# Patient Record
Sex: Male | Born: 1965
Health system: Southern US, Community
[De-identification: ages and names within clinical notes are randomized; demographics above are authoritative.]

## PROBLEM LIST (undated history)

## (undated) DIAGNOSIS — S060X9A Concussion with loss of consciousness of unspecified duration, initial encounter: Secondary | ICD-10-CM

## (undated) DIAGNOSIS — E039 Hypothyroidism, unspecified: Secondary | ICD-10-CM

## (undated) DIAGNOSIS — I5023 Acute on chronic systolic (congestive) heart failure: Secondary | ICD-10-CM

## (undated) DIAGNOSIS — E119 Type 2 diabetes mellitus without complications: Secondary | ICD-10-CM

## (undated) DIAGNOSIS — R569 Unspecified convulsions: Secondary | ICD-10-CM

## (undated) DIAGNOSIS — N529 Male erectile dysfunction, unspecified: Secondary | ICD-10-CM

## (undated) DIAGNOSIS — T884XXA Failed or difficult intubation, initial encounter: Secondary | ICD-10-CM

## (undated) HISTORY — DX: Male erectile dysfunction, unspecified: N52.9

## (undated) HISTORY — PX: HERNIA REPAIR: SHX51

---

## 2015-04-19 DIAGNOSIS — S060X9A Concussion with loss of consciousness of unspecified duration, initial encounter: Secondary | ICD-10-CM

## 2015-04-19 DIAGNOSIS — S060XAA Concussion with loss of consciousness status unknown, initial encounter: Secondary | ICD-10-CM

## 2015-04-19 HISTORY — DX: Concussion with loss of consciousness of unspecified duration, initial encounter: S06.0X9A

## 2015-04-19 HISTORY — DX: Concussion with loss of consciousness status unknown, initial encounter: S06.0XAA

## 2015-05-11 ENCOUNTER — Other Ambulatory Visit: Payer: Self-pay | Admitting: Family Medicine

## 2015-05-11 ENCOUNTER — Ambulatory Visit
Admission: RE | Admit: 2015-05-11 | Discharge: 2015-05-11 | Disposition: A | Discharge status: Home / Self Care | Payer: BLUE CROSS/BLUE SHIELD | Source: Ambulatory Visit | Attending: Family Medicine | Admitting: Family Medicine

## 2015-05-11 ENCOUNTER — Emergency Department
Admission: EM | Admit: 2015-05-11 | Discharge: 2015-05-12 | Disposition: A | Discharge status: Home / Self Care | Payer: BLUE CROSS/BLUE SHIELD | Attending: Emergency Medicine | Admitting: Emergency Medicine

## 2015-05-11 ENCOUNTER — Emergency Department: Payer: BLUE CROSS/BLUE SHIELD

## 2015-05-11 ENCOUNTER — Encounter: Payer: Self-pay | Admitting: Emergency Medicine

## 2015-05-11 DIAGNOSIS — S060X0A Concussion without loss of consciousness, initial encounter: Secondary | ICD-10-CM

## 2015-05-11 DIAGNOSIS — H5340 Unspecified visual field defects: Secondary | ICD-10-CM | POA: Diagnosis not present

## 2015-05-11 DIAGNOSIS — R51 Headache: Secondary | ICD-10-CM | POA: Diagnosis present

## 2015-05-11 DIAGNOSIS — E119 Type 2 diabetes mellitus without complications: Secondary | ICD-10-CM | POA: Diagnosis not present

## 2015-05-11 DIAGNOSIS — Z7984 Long term (current) use of oral hypoglycemic drugs: Secondary | ICD-10-CM | POA: Insufficient documentation

## 2015-05-11 DIAGNOSIS — I10 Essential (primary) hypertension: Secondary | ICD-10-CM | POA: Diagnosis not present

## 2015-05-11 DIAGNOSIS — R519 Headache, unspecified: Secondary | ICD-10-CM

## 2015-05-11 HISTORY — DX: Type 2 diabetes mellitus without complications: E11.9

## 2015-05-11 LAB — URINALYSIS COMPLETE WITH MICROSCOPIC (ARMC ONLY)
BACTERIA UA: NONE SEEN
Bilirubin Urine: NEGATIVE
Hgb urine dipstick: NEGATIVE
LEUKOCYTES UA: NEGATIVE
NITRITE: NEGATIVE
PROTEIN: 30 mg/dL — AB
Specific Gravity, Urine: 1.024 (ref 1.005–1.030)
Squamous Epithelial / LPF: NONE SEEN
pH: 5 (ref 5.0–8.0)

## 2015-05-11 LAB — CBC WITH DIFFERENTIAL/PLATELET
BASOS ABS: 0.1 10*3/uL (ref 0–0.1)
BASOS PCT: 0 %
EOS ABS: 0 10*3/uL (ref 0–0.7)
EOS PCT: 0 %
HCT: 44.7 % (ref 40.0–52.0)
Hemoglobin: 14.7 g/dL (ref 13.0–18.0)
Lymphocytes Relative: 7 %
Lymphs Abs: 0.9 10*3/uL — ABNORMAL LOW (ref 1.0–3.6)
MCH: 28.3 pg (ref 26.0–34.0)
MCHC: 32.9 g/dL (ref 32.0–36.0)
MCV: 85.8 fL (ref 80.0–100.0)
MONO ABS: 0.3 10*3/uL (ref 0.2–1.0)
Monocytes Relative: 3 %
Neutro Abs: 11.6 10*3/uL — ABNORMAL HIGH (ref 1.4–6.5)
Neutrophils Relative %: 90 %
PLATELETS: 249 10*3/uL (ref 150–440)
RBC: 5.21 MIL/uL (ref 4.40–5.90)
RDW: 13.4 % (ref 11.5–14.5)
WBC: 13 10*3/uL — ABNORMAL HIGH (ref 3.8–10.6)

## 2015-05-11 LAB — COMPREHENSIVE METABOLIC PANEL
ALBUMIN: 4.2 g/dL (ref 3.5–5.0)
ALT: 23 U/L (ref 17–63)
AST: 17 U/L (ref 15–41)
Alkaline Phosphatase: 68 U/L (ref 38–126)
Anion gap: 8 (ref 5–15)
BUN: 16 mg/dL (ref 6–20)
CHLORIDE: 101 mmol/L (ref 101–111)
CO2: 25 mmol/L (ref 22–32)
Calcium: 9.5 mg/dL (ref 8.9–10.3)
Creatinine, Ser: 1.1 mg/dL (ref 0.61–1.24)
GFR calc Af Amer: >60 mL/min (ref >60)
GLUCOSE: 332 mg/dL — AB (ref 65–99)
POTASSIUM: 3.9 mmol/L (ref 3.5–5.1)
SODIUM: 134 mmol/L — AB (ref 135–145)
Total Bilirubin: 0.4 mg/dL (ref 0.3–1.2)
Total Protein: 8.1 g/dL (ref 6.5–8.1)

## 2015-05-11 LAB — PROTIME-INR
INR: 1.03
PROTHROMBIN TIME: 13.7 s (ref 11.4–15.0)

## 2015-05-11 LAB — URINE DRUG SCREEN, QUALITATIVE (ARMC ONLY)
Amphetamines, Ur Screen: NOT DETECTED
BARBITURATES, UR SCREEN: NOT DETECTED
Benzodiazepine, Ur Scrn: NOT DETECTED
CANNABINOID 50 NG, UR ~~LOC~~: NOT DETECTED
COCAINE METABOLITE, UR ~~LOC~~: NOT DETECTED
MDMA (ECSTASY) UR SCREEN: NOT DETECTED
METHADONE SCREEN, URINE: NOT DETECTED
Opiate, Ur Screen: NOT DETECTED
Phencyclidine (PCP) Ur S: NOT DETECTED
TRICYCLIC, UR SCREEN: NOT DETECTED

## 2015-05-11 LAB — APTT: APTT: 25 s (ref 24–36)

## 2015-05-11 LAB — ETHANOL

## 2015-05-11 MED ORDER — SODIUM CHLORIDE 0.9 % IV BOLUS (SEPSIS)
1000.0000 mL | Freq: Once | INTRAVENOUS | Status: AC
  Administered 2015-05-11: 1000 mL via INTRAVENOUS

## 2015-05-11 MED ORDER — PROCHLORPERAZINE EDISYLATE 5 MG/ML IJ SOLN
10.0000 mg | Freq: Four times a day (QID) | INTRAMUSCULAR | Status: DC | PRN
  Administered 2015-05-11: 10 mg via INTRAVENOUS
  Filled 2015-05-11: qty 2

## 2015-05-11 MED ORDER — DIPHENHYDRAMINE HCL 50 MG/ML IJ SOLN
12.5000 mg | Freq: Once | INTRAMUSCULAR | Status: AC
  Administered 2015-05-11: 12.5 mg via INTRAVENOUS
  Filled 2015-05-11: qty 1

## 2015-05-11 NOTE — ED Notes (Signed)
Pt states he hit his head 1 week ago, went to PCP and had a CT, scan cleared by PCP. Pt c/o of HA that started Wednesday and has not been relieved by otc medication. Pt went to eye doctor today and was advised to come to ER due to HA and vision changes.

## 2015-05-11 NOTE — ED Notes (Signed)
Patient sent by Opthalmology for MRI Brain and orbit without contrat to rule out subarachnoid hemorrhage due to worsening headache.  Patient c/o  Headache and seeing light flashes in left eye.  Patient had a CT scan of head this morning.  Opthalmology concerned about abnormal eye exam.

## 2015-05-11 NOTE — ED Provider Notes (Addendum)
[facility] Emergency Department Provider Note  ____________________________________________   I have reviewed the triage vital signs and the nursing notes.   HISTORY  Chief Complaint Headache    HPI Shane Frazier is a 50 y.o. male with a history of hypertension, diabetes mellitus controlled by oral medications, high cholesterol presents today complaining of headache. Patient states he hit his head "pretty hard" may be 8 days ago he thinks. A few days later he had the gradual onset of headache. The initial injury he did not pass out or have any neurologic sequela but he states it did hit him "pretty good". Patient has hit his head before. He has no history of postconcussive syndrome. Gradually a few days after this head injury, patient began to have a headache. This is not a normal headache for him. He states he may have had one like this many years ago. He has had some scotoma in the left eye, but denies any visual field deficit. He denies any numbness or tingling difficulty walking or talking or any neurologic complaint. The patient states that he has had no fever no chills and no stiff neck. He is not on any blood thinners, he takes oral anti-inflammatories for this headache and sometimes they help. He had a negative CT scan of the head via his PCP this morning. He was sent to an optometrist for his left scotoma. They documented normal pressures of 14 and 20, they documented however that he had a left homonymous superior quadrantanopsia. This was bilaterally indicated by their field analyzer exam. Patient himself states that he has not noted any visual loss and that when he wears his glasses he does not notice any problem with his vision aside from occasional left-sided flashing which he is not currently having.  Past Medical History  Diagnosis Date  . Hypertension   . Diabetes mellitus without complication ([place])   . Thyroid disease   . High cholesterol     There  are no active problems to display for this patient.   Past Surgical History  Procedure Laterality Date  . Hernia repair      Current Outpatient Rx  Name  Route  Sig  Dispense  Refill  . metFORMIN (GLUMETZA) 1000 MG (MOD) 24 hr tablet   Oral   Take 1,000 mg by mouth daily.           Allergies Review of patient's allergies indicates no known allergies.  No family history on file.  Social History Social History  Substance Use Topics  . Smoking status: Never Smoker   . Smokeless tobacco: Current User    Types: Chew  . Alcohol Use: Yes     Comment: occasionally    Review of Systems Constitutional: No fever/chills Eyes: No visual changes. ENT: No sore throat. No stiff neck no neck pain Cardiovascular: Denies chest pain. Respiratory: Denies shortness of breath. Gastrointestinal:   no vomiting.  No diarrhea.  No constipation. Genitourinary: Negative for dysuria. Musculoskeletal: Negative lower extremity swelling Skin: Negative for rash. Neurological: See history of present illness, negative for focal weakness or numbness. 10-point ROS otherwise negative.  ____________________________________________   PHYSICAL EXAM:  VITAL SIGNS: ED Triage Vitals  Enc Vitals Group     BP 05/11/15 1905 152/84 mmHg     Pulse Rate 05/11/15 1905 96     Resp 05/11/15 1905 18     Temp 05/11/15 1905 99.6 F (37.6 C)     Temp Source 05/11/15 1905 Oral  SpO2 05/11/15 1905 96 %     Weight 05/11/15 1905 233 lb (105.688 kg)     Height 05/11/15 1905 5\' 7"  (1.702 m)     Head Cir --      Peak Flow --      Pain Score 05/11/15 1906 8     Pain Loc --      Pain Edu? --      Excl. in [place]? --     Constitutional: Alert and oriented. Well appearing and in no acute distress. Joking about the IV placement Eyes: Conjunctivae are normal. PERRL. EOMI. Head: Atraumatic. Nose: No congestion/rhinnorhea. Mouth/Throat: Mucous membranes are moist.  Oropharynx non-erythematous. Neck: No stridor.    Nontender with no meningismus Cardiovascular: Normal rate, regular rhythm. Grossly normal heart sounds.  Good peripheral circulation. Respiratory: Normal respiratory effort.  No retractions. Lungs CTAB. Abdominal: Soft and nontender. No distention. No guarding no rebound Back:  There is no focal tenderness or step off there is no midline tenderness there are no lesions noted. there is no CVA tenderness Musculoskeletal: No lower extremity tenderness. No joint effusions, no DVT signs strong distal pulses no edema Neurologic:  Cranial nerves II through XII are grossly intact 5 out of 5 strength bilateral upper and lower extremity. Finger to nose within normal limits heel to shin within normal limits, speech is normal with no word finding difficulty or dysarthria, reflexes symmetric, pupils are equally round and reactive to light, there is no pronator drift, sensation is normal, vision is intact to confrontation, gait is deferred, there is no nystagmus, normal neurologic exam  Skin:  Skin is warm, dry and intact. No rash noted. Psychiatric: Mood and affect are normal. Speech and behavior are normal.  ____________________________________________   LABS (all labs ordered are listed, but only abnormal results are displayed)  Labs Reviewed  URINALYSIS COMPLETEWITH MICROSCOPIC ([place]) - Abnormal; Notable for the following:    Color, Urine YELLOW (*)    APPearance CLEAR (*)    Glucose, UA >500 (*)    Ketones, ur TRACE (*)    Protein, ur 30 (*)    All other components within normal limits  CBC WITH DIFFERENTIAL/PLATELET - Abnormal; Notable for the following:    WBC 13.0 (*)    Neutro Abs 11.6 (*)    Lymphs Abs 0.9 (*)    All other components within normal limits  COMPREHENSIVE METABOLIC PANEL - Abnormal; Notable for the following:    Sodium 134 (*)    Glucose, Bld 332 (*)    All other components within normal limits  PROTIME-INR  APTT  URINE DRUG SCREEN, QUALITATIVE ([facility] ONLY)  ETHANOL    ____________________________________________  EKG  I personally interpreted any EKGs ordered by me or triage Normal sinus rhythm at 79 bpm no acute ST elevation or acute ST depression normal axis unremarkable EKG nonspecific ST changes noted ____________________________________________  RADIOLOGY  I reviewed any imaging ordered by me or triage that were performed during my shift ____________________________________________   PROCEDURES  Procedure(s) performed: None  Critical Care performed: None  ____________________________________________   INITIAL IMPRESSION / ASSESSMENT AND PLAN / ED COURSE  Pertinent labs & imaging results that were available during my care of the patient were reviewed by me and considered in my medical decision making (see chart for details).  I do not detect any visual field loss. I did discuss with the on-call ophthalmologist about the, optometrist finding as indicated above. The ophthalmologist agrees that imaging is necessary. She does  not feel an orbital imaging is required and nor do I given that they are finding this field deficit in both eyes. There is no evidence according to optometry of glaucoma. Patient has no pain to his eyes. He has diffuse generalized headache after a head injury. This could simply be postconcussive issues but the finding of a visual cut on field analysis is of concern. I am unable to reproduce this on my bedside exam. Patient does not seem to have any visual field deficits that I can isolate. Nonetheless, I did discuss with her urologist and they indicated that the best test for this would be an MRI without contrast which we have ordered. We will give the patient migraine medication in case this is new onset migraine with associated aura like flashes, ophthalmology will see the patient first thing in the morning tomorrow and they do not recommend any further visual testing. Patient is neurologically intact and we will obtain an  MRI for further evaluation.  ----------------------------------------- 9:42 PM on 05/11/2015 -----------------------------------------  Patient states his headache is improved. he has been sleeping. When I asked him to rate the headache he says it's "about 0". He does take his head off the pillow and shake his head vigorously to see if he can bring the headache back. He states he feels pretty good. He states "it might be kind of dull". The patient has no change in his neurologic status otherwise and we are waiting MRI results from radiology. While he sits flat on his back patient has sats in the mid 90s 94-95. is likely secondary to his body habitus. Signed out to Dr. Dahlia Client at the end of my shift ____________________________________________   FINAL CLINICAL IMPRESSION(S) / ED DIAGNOSES  Final diagnoses:  None     Schuyler Amor, MD 05/11/15 2126  Schuyler Amor, MD 05/11/15 2142  Schuyler Amor, MD 05/11/15 [phone]

## 2015-05-11 NOTE — ED Notes (Signed)
Patient transported to MRI 

## 2015-05-12 MED ORDER — GADOBENATE DIMEGLUMINE 529 MG/ML IV SOLN
20.0000 mL | Freq: Once | INTRAVENOUS | Status: AC | PRN
  Administered 2015-05-12: 20 mL via INTRAVENOUS

## 2015-05-12 NOTE — Discharge Instructions (Signed)

## 2015-05-12 NOTE — ED Notes (Signed)
Pt taken to MRI  

## 2015-05-12 NOTE — ED Provider Notes (Signed)
-----------------------------------------   2:59 AM on 05/12/2015 -----------------------------------------   Blood pressure 164/94, pulse 87, temperature 99.6 F (37.6 C), temperature source Oral, resp. rate 23, height 5\' 7"  (1.702 m), weight 233 lb (105.688 kg), SpO2 100 %.  Assuming care from Dr. Burlene Arnt.  In short, Shane Frazier is a 50 y.o. male with a chief complaint of Headache .  Refer to the original H&P for additional details.  The current plan of care is to f/u the MRI.  MRI: NO evidence for acute infarct.  1. 2.3 cm T2/FLAIR signal abnormality within the posterior mesial right temporal lobe adjacent to the right lateral ventricles as above. This finding is nonspecific, and may reflect sequela of an infectious or inflammatory process. Demyelinating process could also be considered, although no findings to suggest active demyelination such as enhancement or restricted diffusion seen with this lesion. Lymphoma often occurs adjacent to the lateral ventricles, however, no restricted diffusion seen with this lesion as would be expected with this entity. A low grade glioma/tumor could conceivably be considered. Neurology consultation and correlation with clinical history and laboratory values is recommended. Additionally, a short interval follow-up study suggested to document stability/resolution. 2. Few additional patchy subcentimeter T2/FLAIR hyperintense foci within the deep white matter both cerebral hemispheres, nonspecific, but most likely related to very mild chronic small vessel ischemic disease. 3. Low lying cerebellar tonsils up to 5 mm without frank Chiari malformation.   The patient will be discharged to home. He will follow up with optho and neurology  Loney Hering, MD 05/12/15 [phone]

## 2015-05-13 DIAGNOSIS — R930 Abnormal findings on diagnostic imaging of skull and head, not elsewhere classified: Secondary | ICD-10-CM | POA: Insufficient documentation

## 2015-05-13 DIAGNOSIS — R410 Disorientation, unspecified: Secondary | ICD-10-CM | POA: Insufficient documentation

## 2015-05-13 DIAGNOSIS — G43009 Migraine without aura, not intractable, without status migrainosus: Secondary | ICD-10-CM | POA: Insufficient documentation

## 2015-05-20 DIAGNOSIS — R569 Unspecified convulsions: Secondary | ICD-10-CM

## 2015-05-20 HISTORY — DX: Unspecified convulsions: R56.9

## 2015-05-22 DIAGNOSIS — Z87898 Personal history of other specified conditions: Secondary | ICD-10-CM | POA: Insufficient documentation

## 2015-07-07 ENCOUNTER — Other Ambulatory Visit: Payer: Self-pay | Admitting: Neurology

## 2015-07-07 DIAGNOSIS — G43009 Migraine without aura, not intractable, without status migrainosus: Secondary | ICD-10-CM

## 2015-08-14 ENCOUNTER — Other Ambulatory Visit: Payer: Self-pay | Admitting: Orthopedic Surgery

## 2015-08-14 DIAGNOSIS — M67912 Unspecified disorder of synovium and tendon, left shoulder: Secondary | ICD-10-CM

## 2015-08-14 DIAGNOSIS — S46002A Unspecified injury of muscle(s) and tendon(s) of the rotator cuff of left shoulder, initial encounter: Secondary | ICD-10-CM

## 2015-08-14 DIAGNOSIS — M25512 Pain in left shoulder: Secondary | ICD-10-CM

## 2015-09-01 ENCOUNTER — Ambulatory Visit
Admission: RE | Admit: 2015-09-01 | Discharge: 2015-09-01 | Disposition: A | Discharge status: Home / Self Care | Payer: BLUE CROSS/BLUE SHIELD | Source: Ambulatory Visit | Attending: Orthopedic Surgery | Admitting: Orthopedic Surgery

## 2015-09-01 DIAGNOSIS — S46002A Unspecified injury of muscle(s) and tendon(s) of the rotator cuff of left shoulder, initial encounter: Secondary | ICD-10-CM

## 2015-09-01 DIAGNOSIS — M67912 Unspecified disorder of synovium and tendon, left shoulder: Secondary | ICD-10-CM | POA: Diagnosis not present

## 2015-09-01 DIAGNOSIS — M25512 Pain in left shoulder: Secondary | ICD-10-CM

## 2015-09-01 DIAGNOSIS — M19012 Primary osteoarthritis, left shoulder: Secondary | ICD-10-CM | POA: Insufficient documentation

## 2015-09-01 DIAGNOSIS — X58XXXA Exposure to other specified factors, initial encounter: Secondary | ICD-10-CM | POA: Diagnosis not present

## 2015-09-01 DIAGNOSIS — M75122 Complete rotator cuff tear or rupture of left shoulder, not specified as traumatic: Secondary | ICD-10-CM | POA: Diagnosis not present

## 2015-09-07 ENCOUNTER — Encounter: Payer: Self-pay | Admitting: *Deleted

## 2015-09-07 ENCOUNTER — Inpatient Hospital Stay: Admission: RE | Admit: 2015-09-07 | Payer: BLUE CROSS/BLUE SHIELD | Source: Ambulatory Visit

## 2015-09-07 NOTE — Patient Instructions (Signed)
  Your procedure is scheduled on: 09-09-15 [facility]) Report to [place] To find out your arrival time please call [phone] between 1PM - 3PM on 09-08-15 (TUESDAY)  Remember: Instructions that are not followed completely may result in serious medical risk, up to and including death, or upon the discretion of your surgeon and anesthesiologist your surgery may need to be rescheduled.    __X__ 1. Do not eat food or drink liquids after midnight. No gum chewing or hard candies.     __X__ 2. No Alcohol for 24 hours before or after surgery.   ____ 3. Bring all medications with you on the day of surgery if instructed.    __X_ 4. Notify your doctor if there is any change in your medical condition     (cold, fever, infections).     Do not wear jewelry, make-up, hairpins, clips or nail polish.  Do not wear lotions, powders, or perfumes. You may wear deodorant.  Do not shave 48 hours prior to surgery. Men may shave face and neck.  Do not bring valuables to the hospital.    [facility] is not responsible for any belongings or valuables.               Contacts, dentures or bridgework may not be worn into surgery.  Leave your suitcase in the car. After surgery it may be brought to your room.  For patients admitted to the hospital, discharge time is determined by your treatment team.   Patients discharged the day of surgery will not be allowed to drive home.   Please read over the following fact sheets that you were given:     _X___ Take these medicines the morning of surgery with A SIP OF WATER:    1. KEPPRA (LEVETIRACETAM)  2. LISINOPRI  3. DILTIAZEM (CARDIZEM)  4. ATORVASTATIN (LIPITOR)  5.LEVOTHYROXINE  6.  ____ Fleet Enema (as directed)   _X___ Use CHG Soap as directed  ____ Use inhalers on the day of surgery  _X___ Stop metformin 2 days prior to surgery-STOP NOW    ____ Take 1/2 of usual insulin dose the night before surgery and none on the  morning of surgery.   ____ Stop Coumadin/Plavix/aspirin-N/A  _X___ Stop Anti-inflammatories-STOP MELOXICAM NOW-NO NSAIDS OR ASA PRODUCTS-TYLENOL OK TAKE   ____ Stop supplements until after surgery.    ____ Bring C-Pap to the hospital.

## 2015-09-07 NOTE — Pre-Procedure Instructions (Signed)
Value Range  EKG Ventricular Rate 94 BPM   EKG Atrial Rate 94 BPM   EKG P-R Interval 170 ms  EKG QRS Duration 106 ms  EKG Q-T Interval 368 ms  EKG QTC Calculation 460 ms  EKG Calculated P Axis 30 degrees   EKG Calculated R Axis 0 degrees   EKG Calculated T Axis -17 degrees    Result Narrative  NORMAL SINUS RHYTHM NONSPECIFIC T WAVE ABNORMALITY PROLONGED QT NO PREVIOUS ECGS AVAILABLE Confirmed by Ishmael Holter MD, Charles [phone]) on 05/20/2015 8:35:08 PM   Status Results Details

## 2015-09-08 ENCOUNTER — Ambulatory Visit
Admission: RE | Admit: 2015-09-08 | Discharge: 2015-09-08 | Disposition: A | Discharge status: Home / Self Care | Payer: BLUE CROSS/BLUE SHIELD | Source: Ambulatory Visit | Attending: Unknown Physician Specialty | Admitting: Unknown Physician Specialty

## 2015-09-08 DIAGNOSIS — M75102 Unspecified rotator cuff tear or rupture of left shoulder, not specified as traumatic: Secondary | ICD-10-CM | POA: Diagnosis not present

## 2015-09-08 DIAGNOSIS — Z01812 Encounter for preprocedural laboratory examination: Secondary | ICD-10-CM | POA: Diagnosis present

## 2015-09-08 LAB — BASIC METABOLIC PANEL
ANION GAP: 8 (ref 5–15)
BUN: 15 mg/dL (ref 6–20)
CALCIUM: 9.5 mg/dL (ref 8.9–10.3)
CO2: 27 mmol/L (ref 22–32)
Chloride: 103 mmol/L (ref 101–111)
Creatinine, Ser: 1.19 mg/dL (ref 0.61–1.24)
GLUCOSE: 107 mg/dL — AB (ref 65–99)
POTASSIUM: 4.1 mmol/L (ref 3.5–5.1)
SODIUM: 138 mmol/L (ref 135–145)
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
## 2015-09-09 ENCOUNTER — Ambulatory Visit: Payer: BLUE CROSS/BLUE SHIELD | Admitting: Anesthesiology

## 2015-09-09 ENCOUNTER — Encounter
Admission: RE | Disposition: A | Discharge status: Home / Self Care | Payer: Self-pay | Source: Ambulatory Visit | Attending: Unknown Physician Specialty

## 2015-09-09 ENCOUNTER — Ambulatory Visit
Admission: RE | Admit: 2015-09-09 | Discharge: 2015-09-09 | Disposition: A | Discharge status: Home / Self Care | Payer: BLUE CROSS/BLUE SHIELD | Source: Ambulatory Visit | Attending: Unknown Physician Specialty | Admitting: Unknown Physician Specialty

## 2015-09-09 DIAGNOSIS — E079 Disorder of thyroid, unspecified: Secondary | ICD-10-CM | POA: Insufficient documentation

## 2015-09-09 DIAGNOSIS — M7542 Impingement syndrome of left shoulder: Secondary | ICD-10-CM | POA: Insufficient documentation

## 2015-09-09 DIAGNOSIS — X58XXXA Exposure to other specified factors, initial encounter: Secondary | ICD-10-CM | POA: Insufficient documentation

## 2015-09-09 DIAGNOSIS — Z833 Family history of diabetes mellitus: Secondary | ICD-10-CM | POA: Insufficient documentation

## 2015-09-09 DIAGNOSIS — S46112A Strain of muscle, fascia and tendon of long head of biceps, left arm, initial encounter: Secondary | ICD-10-CM | POA: Diagnosis not present

## 2015-09-09 DIAGNOSIS — Y929 Unspecified place or not applicable: Secondary | ICD-10-CM | POA: Insufficient documentation

## 2015-09-09 DIAGNOSIS — I10 Essential (primary) hypertension: Secondary | ICD-10-CM | POA: Diagnosis not present

## 2015-09-09 DIAGNOSIS — E785 Hyperlipidemia, unspecified: Secondary | ICD-10-CM | POA: Diagnosis not present

## 2015-09-09 DIAGNOSIS — M75102 Unspecified rotator cuff tear or rupture of left shoulder, not specified as traumatic: Secondary | ICD-10-CM | POA: Diagnosis present

## 2015-09-09 DIAGNOSIS — E039 Hypothyroidism, unspecified: Secondary | ICD-10-CM | POA: Diagnosis not present

## 2015-09-09 DIAGNOSIS — E119 Type 2 diabetes mellitus without complications: Secondary | ICD-10-CM | POA: Insufficient documentation

## 2015-09-09 DIAGNOSIS — F17200 Nicotine dependence, unspecified, uncomplicated: Secondary | ICD-10-CM | POA: Diagnosis not present

## 2015-09-09 DIAGNOSIS — Z79899 Other long term (current) drug therapy: Secondary | ICD-10-CM | POA: Diagnosis not present

## 2015-09-09 DIAGNOSIS — R569 Unspecified convulsions: Secondary | ICD-10-CM | POA: Insufficient documentation

## 2015-09-09 DIAGNOSIS — Z7984 Long term (current) use of oral hypoglycemic drugs: Secondary | ICD-10-CM | POA: Diagnosis not present

## 2015-09-09 DIAGNOSIS — E7800 Pure hypercholesterolemia, unspecified: Secondary | ICD-10-CM | POA: Diagnosis not present

## 2015-09-09 HISTORY — DX: Hypothyroidism, unspecified: E03.9

## 2015-09-09 HISTORY — DX: Unspecified convulsions: R56.9

## 2015-09-09 HISTORY — PX: SHOULDER ARTHROSCOPY WITH ROTATOR CUFF REPAIR AND SUBACROMIAL DECOMPRESSION: SHX5686

## 2015-09-09 HISTORY — DX: Failed or difficult intubation, initial encounter: T88.4XXA

## 2015-09-09 LAB — GLUCOSE, CAPILLARY
GLUCOSE-CAPILLARY: 139 mg/dL — AB (ref 65–99)
Glucose-Capillary: 138 mg/dL — ABNORMAL HIGH (ref 65–99)

## 2015-09-09 SURGERY — SHOULDER ARTHROSCOPY WITH ROTATOR CUFF REPAIR AND SUBACROMIAL DECOMPRESSION
Anesthesia: General | Site: Shoulder | Laterality: Left | Wound class: Clean

## 2015-09-09 MED ORDER — EPINEPHRINE HCL 1 MG/ML IJ SOLN
INTRAMUSCULAR | Status: DC | PRN
  Administered 2015-09-09: 7 mL

## 2015-09-09 MED ORDER — LIDOCAINE HCL (CARDIAC) 20 MG/ML IV SOLN
INTRAVENOUS | Status: DC | PRN
  Administered 2015-09-09: 30 mg via INTRAVENOUS

## 2015-09-09 MED ORDER — SODIUM CHLORIDE 0.9 % IV SOLN
INTRAVENOUS | Status: DC
  Administered 2015-09-09 (×3): via INTRAVENOUS

## 2015-09-09 MED ORDER — GLYCOPYRROLATE 0.2 MG/ML IJ SOLN
INTRAMUSCULAR | Status: DC | PRN
  Administered 2015-09-09: .6 mg via INTRAVENOUS
  Administered 2015-09-09: .2 mg via INTRAVENOUS

## 2015-09-09 MED ORDER — SODIUM CHLORIDE 0.9 % IV SOLN
10000.0000 ug | INTRAVENOUS | Status: DC | PRN
  Administered 2015-09-09: 10 ug/min via INTRAVENOUS
  Administered 2015-09-09: 20 ug via INTRAVENOUS

## 2015-09-09 MED ORDER — FENTANYL CITRATE (PF) 100 MCG/2ML IJ SOLN
INTRAMUSCULAR | Status: AC
  Administered 2015-09-09: 50 ug via INTRAVENOUS
  Filled 2015-09-09: qty 2

## 2015-09-09 MED ORDER — PHENYLEPHRINE HCL 10 MG/ML IJ SOLN
INTRAMUSCULAR | Status: DC | PRN
  Administered 2015-09-09: 100 ug via INTRAVENOUS
  Administered 2015-09-09 (×2): 200 ug via INTRAVENOUS

## 2015-09-09 MED ORDER — MIDAZOLAM HCL 5 MG/5ML IJ SOLN
2.0000 mg | Freq: Once | INTRAMUSCULAR | Status: AC
  Administered 2015-09-09: 2 mg via INTRAVENOUS

## 2015-09-09 MED ORDER — LIDOCAINE HCL (PF) 1 % IJ SOLN
INTRAMUSCULAR | Status: AC
  Administered 2015-09-09: 1 mL via INTRADERMAL
  Filled 2015-09-09: qty 5

## 2015-09-09 MED ORDER — VASOPRESSIN 20 UNIT/ML IV SOLN
INTRAVENOUS | Status: DC | PRN
  Administered 2015-09-09 (×5): 1 [IU] via INTRAVENOUS

## 2015-09-09 MED ORDER — LACTATED RINGERS IR SOLN
Status: DC | PRN
  Administered 2015-09-09: 3000 mL

## 2015-09-09 MED ORDER — MIDAZOLAM HCL 2 MG/2ML IJ SOLN
INTRAMUSCULAR | Status: DC | PRN
  Administered 2015-09-09: 1 mg via INTRAVENOUS

## 2015-09-09 MED ORDER — MIDAZOLAM HCL 5 MG/5ML IJ SOLN
INTRAMUSCULAR | Status: AC
  Administered 2015-09-09: 2 mg via INTRAVENOUS
  Filled 2015-09-09: qty 5

## 2015-09-09 MED ORDER — ROCURONIUM BROMIDE 100 MG/10ML IV SOLN
INTRAVENOUS | Status: DC | PRN
  Administered 2015-09-09: 10 mg via INTRAVENOUS
  Administered 2015-09-09: 5 mg via INTRAVENOUS
  Administered 2015-09-09: 35 mg via INTRAVENOUS
  Administered 2015-09-09: 10 mg via INTRAVENOUS

## 2015-09-09 MED ORDER — CEFAZOLIN SODIUM 1 G IJ SOLR
INTRAMUSCULAR | Status: AC
  Filled 2015-09-09: qty 20

## 2015-09-09 MED ORDER — ONDANSETRON HCL 4 MG/2ML IJ SOLN
INTRAMUSCULAR | Status: DC | PRN
Start: 2015-09-09 — End: 2015-09-09
  Administered 2015-09-09: 4 mg via INTRAVENOUS

## 2015-09-09 MED ORDER — PROPOFOL 10 MG/ML IV BOLUS
INTRAVENOUS | Status: DC | PRN
  Administered 2015-09-09: 180 mg via INTRAVENOUS

## 2015-09-09 MED ORDER — FAMOTIDINE 20 MG PO TABS
ORAL_TABLET | ORAL | Status: AC
  Administered 2015-09-09: 20 mg via ORAL
  Filled 2015-09-09: qty 1

## 2015-09-09 MED ORDER — FENTANYL CITRATE (PF) 100 MCG/2ML IJ SOLN
50.0000 ug | Freq: Once | INTRAMUSCULAR | Status: AC
  Administered 2015-09-09: 50 ug via INTRAVENOUS

## 2015-09-09 MED ORDER — CEFAZOLIN SODIUM-DEXTROSE 2-3 GM-% IV SOLR
INTRAVENOUS | Status: DC | PRN
  Administered 2015-09-09: 2 g via INTRAVENOUS

## 2015-09-09 MED ORDER — SUCCINYLCHOLINE CHLORIDE 20 MG/ML IJ SOLN
INTRAMUSCULAR | Status: DC | PRN
  Administered 2015-09-09: 120 mg via INTRAVENOUS

## 2015-09-09 MED ORDER — ROXICET 5-325 MG PO TABS: 1.0000 | ORAL_TABLET | ORAL | Status: DC | PRN

## 2015-09-09 MED ORDER — EPINEPHRINE HCL 1 MG/ML IJ SOLN
INTRAMUSCULAR | Status: AC
  Filled 2015-09-09: qty 1

## 2015-09-09 MED ORDER — FAMOTIDINE 20 MG PO TABS
20.0000 mg | ORAL_TABLET | Freq: Once | ORAL | Status: AC
  Administered 2015-09-09: 20 mg via ORAL

## 2015-09-09 MED ORDER — ROPIVACAINE HCL 5 MG/ML IJ SOLN
INTRAMUSCULAR | Status: AC
  Administered 2015-09-09: 10 mL via PERINEURAL
  Administered 2015-09-09: 15 mL via PERINEURAL
  Administered 2015-09-09: 5 mL via PERINEURAL
  Filled 2015-09-09: qty 40

## 2015-09-09 MED ORDER — NEOSTIGMINE METHYLSULFATE 10 MG/10ML IV SOLN
INTRAVENOUS | Status: DC | PRN
  Administered 2015-09-09: 3 mg via INTRAVENOUS

## 2015-09-09 MED ORDER — LACTATED RINGERS IV SOLN
INTRAVENOUS | Status: DC | PRN
  Administered 2015-09-09: 12:00:00 via INTRAVENOUS

## 2015-09-09 MED ORDER — HYDRALAZINE HCL 20 MG/ML IJ SOLN
INTRAMUSCULAR | Status: DC | PRN
  Administered 2015-09-09 (×4): 4 mg via INTRAVENOUS

## 2015-09-09 MED ORDER — FENTANYL CITRATE (PF) 100 MCG/2ML IJ SOLN
INTRAMUSCULAR | Status: DC | PRN
  Administered 2015-09-09: 50 ug via INTRAVENOUS

## 2015-09-09 SURGICAL SUPPLY — 74 items
ADAPTER IRRIG TUBE 2 SPIKE SOL (ADAPTER) ×2 IMPLANT
ANCHOR ALL-SUT Q-FIX 2.8 (Anchor) ×4 IMPLANT
ANCHOR SUT 5.5 SPEEDSCREW (Screw) ×6 IMPLANT
ARTHROWAND PARAGON T2 (SURGICAL WAND)
BLADE ABRADER 4.5 (BLADE) ×2 IMPLANT
BLADE SHAVER 4.5X7 STR FR (MISCELLANEOUS) ×2 IMPLANT
BLADE SURG 15 STRL LF DISP TIS (BLADE) IMPLANT
BLADE SURG 15 STRL SS (BLADE)
BUR ABRADER 5.5 BLK (MISCELLANEOUS) ×1 IMPLANT
BUR BR 5.5 WIDE MOUTH (BURR) ×2 IMPLANT
BURR ABRADER 5.5 BLK (MISCELLANEOUS) ×2
CANNULA 8.5X75 THRED (CANNULA) IMPLANT
CANNULA SHAVER 8MMX76MM (CANNULA) IMPLANT
CAP LOCK ULTRA CANNULA (MISCELLANEOUS) IMPLANT
CHLORAPREP W/TINT 26ML (MISCELLANEOUS) ×4 IMPLANT
DRAPE STERI 35X30 U-POUCH (DRAPES) ×2 IMPLANT
ELECT REM PT RETURN 9FT ADLT (ELECTROSURGICAL) ×2
ELECTRODE REM PT RTRN 9FT ADLT (ELECTROSURGICAL) ×1 IMPLANT
GAUZE SPONGE 4X4 12PLY STRL (GAUZE/BANDAGES/DRESSINGS) ×2 IMPLANT
GLOVE BIO SURGEON STRL SZ7.5 (GLOVE) ×4 IMPLANT
GLOVE BIO SURGEON STRL SZ8 (GLOVE) ×2 IMPLANT
GLOVE INDICATOR 8.0 STRL GRN (GLOVE) ×2 IMPLANT
GOWN STRL REUS W/ TWL LRG LVL3 (GOWN DISPOSABLE) ×1 IMPLANT
GOWN STRL REUS W/TWL LRG LVL3 (GOWN DISPOSABLE) ×1
GOWN STRL REUS W/TWL LRG LVL4 (GOWN DISPOSABLE) ×2 IMPLANT
IV LACTATED RINGER IRRG 3000ML (IV SOLUTION) ×7
IV LR IRRIG 3000ML ARTHROMATIC (IV SOLUTION) ×7 IMPLANT
KIT SHOULDER TRACTION (DRAPES) ×2 IMPLANT
KIT SUTURE 2.8 Q-FIX DISP (MISCELLANEOUS) ×2 IMPLANT
MANIFOLD 4PT FOR NEPTUNE1 (MISCELLANEOUS) ×2 IMPLANT
NDL MAYO CATGUT SZ5 (NEEDLE) ×2
NDL SUT 5 .5 CRC TPR PNT MAYO (NEEDLE) ×2 IMPLANT
NEEDLE 18GX1X1/2 (RX/OR ONLY) (NEEDLE) IMPLANT
NEEDLE MAYO CATGUT SZ 1.5 (NEEDLE)
NEEDLE MAYO CATGUT SZ 2 (NEEDLE) IMPLANT
NEEDLE SPNL 18GX3.5 QUINCKE PK (NEEDLE) IMPLANT
PACK ARTHROSCOPY SHOULDER (MISCELLANEOUS) ×2 IMPLANT
PASSER SUT CAPTURE FIRST (SUTURE) ×2 IMPLANT
SET TUBE SUCT SHAVER OUTFL 24K (TUBING) ×2 IMPLANT
SOL PREP PVP 2OZ (MISCELLANEOUS) ×2
SOLUTION PREP PVP 2OZ (MISCELLANEOUS) ×1 IMPLANT
STAPLER SKIN PROX 35W (STAPLE) IMPLANT
SUT ETHIBOND NAB CT1 #1 30IN (SUTURE) ×2 IMPLANT
SUT ETHILON 3-0 FS-10 30 BLK (SUTURE) ×2
SUT PDS AB 1 CT1 27 (SUTURE) IMPLANT
SUT PERFECTPASSER WHITE CART (SUTURE) IMPLANT
SUT PROLENE 2 0 CT2 30 (SUTURE) IMPLANT
SUT SMART STITCH CARTRIDGE (SUTURE) IMPLANT
SUT TICRON 2-0 30IN 311381 (SUTURE) IMPLANT
SUT VIC AB 0 CT1 36 (SUTURE) IMPLANT
SUT VIC AB 0 CT2 27 (SUTURE) ×2 IMPLANT
SUT VIC AB 2-0 CT1 27 (SUTURE) ×1
SUT VIC AB 2-0 CT1 TAPERPNT 27 (SUTURE) ×1 IMPLANT
SUT VIC AB 2-0 CT2 27 (SUTURE) IMPLANT
SUT VIC AB 2-0 SH 27 (SUTURE)
SUT VIC AB 2-0 SH 27XBRD (SUTURE) IMPLANT
SUT VIC AB 3-0 SH 27 (SUTURE)
SUT VIC AB 3-0 SH 27X BRD (SUTURE) IMPLANT
SUTURE EHLN 3-0 FS-10 30 BLK (SUTURE) ×1 IMPLANT
SUTURE MAGNUM WIRE 2X48 BLK (SUTURE) ×6 IMPLANT
SYRINGE 10CC LL (SYRINGE) IMPLANT
TAPE MICROFOAM 4IN (TAPE) ×2 IMPLANT
TUBING ARTHRO INFLOW-ONLY STRL (TUBING) ×2 IMPLANT
TUBING CONNECTING 10 (TUBING) ×2 IMPLANT
WAND 30 DEG SABER W/CORD (SURGICAL WAND) IMPLANT
WAND ARTHRO PARAGON T2 (SURGICAL WAND) IMPLANT
WAND COVAC 50 IFS (MISCELLANEOUS) IMPLANT
WAND COVATOR 20 (MISCELLANEOUS) IMPLANT
WAND HAND CNTRL MULTIVAC 50 (MISCELLANEOUS) IMPLANT
WAND HAND CNTRL MULTIVAC 90 (MISCELLANEOUS) IMPLANT
WAND TENDON TOPAZ 0 ANGL (MISCELLANEOUS) IMPLANT
WAND TOPAZ EPF  WAS Q (MISCELLANEOUS)
WAND TOPAZ EPF WAS Q (MISCELLANEOUS) IMPLANT
WIRE MAGNUM (SUTURE) IMPLANT

## 2015-09-09 NOTE — Op Note (Signed)
09/09/2015  2:37 PM  Patient:   Shane Frazier  Pre-Op Diagnosis:   left shoulder rotator cuff tear  Postoperative diagnosis: Same plus partial tear of the long head of the biceps tendon and secondary impingement   Procedure: Arthroscopic subacromial decompression along with arthroscopic release of the long head of biceps tendon followed by mini incision rotator cuff repair  Anesthesia: General endotracheal with interscalene block placed preoperatively by the anesthesiologist.  Findings: As above.   Complications: None  Estimated blood loss: negligible  Tourniquet time: None  Drains: None   Brief clinical note:  The patient's symptoms have progressed despite medications, activity modification, etc. The patient's history and examination are consistent with torn rotator cuff left shoulder. These findings were confirmed by MRI scan. The patient presents at this time for definitive management of these shoulder symptoms.  Procedure: The patient had an interscalene block on the left shoulder in the preop area. The patient was then brought into the operating room and placed in the supine position. The patient then underwent  endotracheal intubation and general anesthesia before being repositioned in the lateral decubitus position. The left shoulder was prepped and draped in usual fashion for shoulder procedure. The shoulder was supported with the Acufex shoulder suspension device.  12 pounds of traction was utilized. Preoperative antibiotics were administered. A timeout was performed . A posterior portal was created and the glenohumeral joint thoroughly inspected revealing a frayed long head of the biceps tendon plus large rotator cuff tear. An anterior portal was created. An ArthroCare wand was inserted and used to obtain hemostasis as well as to perform a limited synovectomy.The biceps tendon was evaluated and then released from its labral attachment using an ArthroCare  wand.  The scope was repositioned through the posterior portal into the subacromial space. A separate lateral portal was created using an outside-in technique. The rotator cuff was inspected revealing a large  tear. An ArthroCare 90 wand followed by a 4.0 full-radius resector was introduced and used to perform a subtotal bursectomy. The ArthroCare wand was then inserted and used to remove the periosteal tissue off the undersurface of the anterior third of the acromion as well as to recess the coracoacromial ligament from its attachment along the anterior and lateral margins of the acromion.   With the scope in the lateral portal a 5.63mm acromionizing bur was introduced through the posterior portal and used to perform the decompression by removing the undersurface of the anterior third of the acromion. I also used a 5.5 mm round bur to lightly decorticate the greater tuberosity in the intended area of the rotator rotator cuff reattachment. I also used a small punch to make vascular vent holes in the greater tuberosity. The instruments were then removed from the subacromial space.  An approximately 4 cm incision was made over the midlateral aspect of the shoulder.   This incision was carried down through the subcutaneous tissues onto the deltoid. The deltoid was divided in line with its fibers to provide access into the subacromial space. The rotator cuff tear was readily identified. The margins were debrided.   The tear was repaired using horizontal mattress sutures secured to two Q- Fix anchors. The horizontal mattress suture tails were then crisscrossed over to 2 laterally placed 5.5 speed screws. This gave me a 2 row repair of the torn rotator cuff. I then reinforced the repair with another #2 nonabsorbable suture that was placed in the anterior part of the cuff tear in inverted horizontal mattress  fashion and secured to a 5.5 speed screw. I also placed a #2 nonabsorbable suture in simple suture fashion in  the posterior portion of the cuff tear. The repaired cuff seemed to be  quite stable.   The wound was copiously irrigated with sterile saline solution before the deltoid was repaired to bone with #1 ethibond suture.  Deltoid interval was closed with 0 vicryl. The subcutaneous tissues were closed  using 2-0 Vicryl interrupted sutures and the skin incision was closed using staples. The portal sites also were closed using 3-O nylon sutures. A sterile bulky dressing was applied to the shoulder and then the left upper extremity was placed into a shoulder immobilizer. The patient was then awakened, extubated, and returned to the recovery room in satisfactory condition after tolerating the procedure well.  Blood loss was negligible.

## 2015-09-09 NOTE — Discharge Instructions (Addendum)
Use shoulder immobilizer at all times  Keep dressing dry  Leave dressing in place until first postoperative visit  Return to the clinic about 1 week post surgery  Take 81 mg aspirin or Bufferin tablet twice a day for 2 weeks post surgery  Can sleep with multiple pillows behind the back or in a recliner  Use TENS unit if prescribed  Take pain medication prior to going to sleep the evening of your surgery  Ice pack prn  Activity as tolerated  AMBULATORY SURGERY  DISCHARGE INSTRUCTIONS   1) The drugs that you were given will stay in your system until tomorrow so for the next 24 hours you should not:  A) Drive an automobile B) Make any legal decisions C) Drink any alcoholic beverage   2) You may resume regular meals tomorrow.  Today it is better to start with liquids and gradually work up to solid foods.  You may eat anything you prefer, but it is better to start with liquids, then soup and crackers, and gradually work up to solid foods.   3) Please notify your doctor immediately if you have any unusual bleeding, trouble breathing, redness and pain at the surgery site, drainage, fever, or pain not relieved by medication.    4) Additional Instructions:        Please contact your physician with any problems or Same Day Surgery at [phone], Monday through Friday 6 am to 4 pm, or [place] at [facility] number at [phone].AMBULATORY SURGERY  DISCHARGE INSTRUCTIONS   5) The drugs that you were given will stay in your system until tomorrow so for the next 24 hours you should not:  D) Drive an automobile E) Make any legal decisions F) Drink any alcoholic beverage   6) You may resume regular meals tomorrow.  Today it is better to start with liquids and gradually work up to solid foods.  You may eat anything you prefer, but it is better to start with liquids, then soup and crackers, and gradually work up to solid foods.   7) Please notify your doctor  immediately if you have any unusual bleeding, trouble breathing, redness and pain at the surgery site, drainage, fever, or pain not relieved by medication.    8) Additional Instructions:        Please contact your physician with any problems or Same Day Surgery at [phone], Monday through Friday 6 am to 4 pm, or [place] at [facility] number at [phone].AMBULATORY SURGERY  DISCHARGE INSTRUCTIONS   9) The drugs that you were given will stay in your system until tomorrow so for the next 24 hours you should not:  G) Drive an automobile H) Make any legal decisions I) Drink any alcoholic beverage   10) You may resume regular meals tomorrow.  Today it is better to start with liquids and gradually work up to solid foods.  You may eat anything you prefer, but it is better to start with liquids, then soup and crackers, and gradually work up to solid foods.   11) Please notify your doctor immediately if you have any unusual bleeding, trouble breathing, redness and pain at the surgery site, drainage, fever, or pain not relieved by medication.    12) Additional Instructions:        Please contact your physician with any problems or Same Day Surgery at [phone], Monday through Friday 6 am to 4 pm, or [place] at [facility] number at [phone].

## 2015-09-09 NOTE — Anesthesia Procedure Notes (Addendum)
Anesthesia Regional Block:  Interscalene brachial plexus block  Pre-Anesthetic Checklist: ,, timeout performed, Correct Patient, Correct Site, Correct Laterality, Correct Procedure, Correct Position, site marked, Risks and benefits discussed,  Surgical consent,  Pre-op evaluation,  At surgeon's request and post-op pain management  Laterality: Upper and Left  Prep: chloraprep       Needles:  Injection technique: Single-shot  Needle Type: Stimiplex     Needle Length: 5cm 5 cm Needle Gauge: 22 and 22 G    Additional Needles:  Procedures: ultrasound guided (picture in chart) Interscalene brachial plexus block Narrative:  Start time: 09/09/2015 10:18 AM End time: 09/09/2015 10:20 AM Injection made incrementally with aspirations every 5 mL.  Performed by: Personally  Anesthesiologist: Katy Fitch K  Additional Notes: Patient reports baseline weakness in left arm and shoulder pre block  Functioning IV was confirmed and monitors were applied.  A 65mm 22ga Stimuplex needle was used. Sterile prep,hand hygiene and sterile gloves were used.  Negative aspiration and negative test dose prior to incremental administration of local anesthetic. The patient tolerated the procedure well with no immediate complications.   Procedure Name: Intubation Date/Time: 09/09/2015 10:47 AM Performed by: Courtney Paris Pre-anesthesia Checklist: Patient identified, Emergency Drugs available, Suction available and Patient being monitored Patient Re-evaluated:Patient Re-evaluated prior to inductionOxygen Delivery Method: Circle system utilized Intubation Type: IV induction and Combination inhalational/ intravenous induction Ventilation: Mask ventilation without difficulty Laryngoscope Size: McGraph and 4 Grade View: Grade III Tube type: Oral Number of attempts: 2 Airway Equipment and Method: Stylet Placement Confirmation: ETT inserted through vocal cords under direct vision,  positive ETCO2,  CO2  detector and breath sounds checked- equal and bilateral Secured at: 22 cm Tube secured with: Tape Dental Injury: Teeth and Oropharynx as per pre-operative assessment  Difficulty Due To: Difficulty was anticipated, Difficult Airway- due to reduced neck mobility and Difficult Airway- due to dentition Future Recommendations: Recommend- induction with short-acting agent, and alternative techniques readily available

## 2015-09-09 NOTE — Anesthesia Postprocedure Evaluation (Signed)
Anesthesia Post Note  Patient: PHILLIPE EBERHARDT  Procedure(s) Performed: Procedure(s) (LRB): SHOULDER ARTHROSCOPY, SUBACROMIAL DECOMPRESSION,BICEPS TENSON RELEASE AND MINI OPEN ROTATOR CUFF REPAIR (Left)  Patient location during evaluation: PACU Anesthesia Type: General Level of consciousness: awake and alert Pain management: pain level controlled Vital Signs Assessment: post-procedure vital signs reviewed and stable Respiratory status: spontaneous breathing, nonlabored ventilation, respiratory function stable and patient connected to nasal cannula oxygen Cardiovascular status: blood pressure returned to baseline and stable Postop Assessment: no signs of nausea or vomiting Anesthetic complications: no    Last Vitals:  Filed Vitals:   09/09/15 1440 09/09/15 1453  BP: 93/53 119/53  Pulse: 89 98  Temp: 36.3 C 35.7 C  Resp: 22 16    Last Pain: There were no vitals filed for this visit.               Precious Haws Aleksa Catterton

## 2015-09-09 NOTE — H&P (Signed)
  H and P reviewed. No changes. Uploaded at later date. 

## 2015-09-09 NOTE — Transfer of Care (Signed)
Immediate Anesthesia Transfer of Care Note  Patient: Shane Frazier  Procedure(s) Performed: Procedure(s): SHOULDER ARTHROSCOPY, SUBACROMIAL DECOMPRESSION,BICEPS TENSON RELEASE AND MINI OPEN ROTATOR CUFF REPAIR (Left)  Patient Location: PACU  Anesthesia Type:General and GA combined with regional for post-op pain  Level of Consciousness: awake, oriented and patient cooperative  Airway & Oxygen Therapy: Patient Spontanous Breathing and Patient connected to face mask oxygen  Post-op Assessment: Report given to RN and Post -op Vital signs reviewed and stable  Post vital signs: Reviewed and stable  Last Vitals:  Filed Vitals:   09/09/15 1015 09/09/15 1028  BP: 158/81 143/101  Pulse: 80 81  Temp:    Resp: 20 17    Last Pain: There were no vitals filed for this visit.       Complications: No apparent anesthesia complications

## 2015-09-09 NOTE — Anesthesia Preprocedure Evaluation (Signed)
Anesthesia Evaluation  Patient identified by MRN, date of birth, ID band Patient awake    Reviewed: Allergy & Precautions, H&P , NPO status , Patient's Chart, lab work & pertinent test results  History of Anesthesia Complications Negative for: history of anesthetic complications  Airway Mallampati: II  TM Distance: >3 FB Neck ROM: full    Dental  (+) Poor Dentition, Chipped, Missing   Pulmonary neg pulmonary ROS, neg shortness of breath,    Pulmonary exam normal breath sounds clear to auscultation       Cardiovascular Exercise Tolerance: Good hypertension, (-) angina(-) Past MI and (-) DOE Normal cardiovascular exam Rhythm:regular Rate:Normal     Neuro/Psych Seizures -, Well Controlled,  negative psych ROS   GI/Hepatic negative GI ROS, Neg liver ROS,   Endo/Other  diabetes, Type 2Hypothyroidism   Renal/GU negative Renal ROS  negative genitourinary   Musculoskeletal   Abdominal   Peds  Hematology negative hematology ROS (+)   Anesthesia Other Findings Past Medical History:   Hypertension                                                 Diabetes mellitus without complication (HCC)                 Thyroid disease                                              High cholesterol                                             Hypothyroidism                                               Seizures ([place])                                  05-20-15         Comment:X 1-PT HIT HIS HEAD AT WORK AND THEN HAD A               SEIZURE  Past Surgical History:   HERNIA REPAIR                                                BMI    Body Mass Index   39.92 kg/m 2    Signs and symptoms suggestive of sleep apnea   Patient endorses weakness in operative arm and shoulder at baseline   Reproductive/Obstetrics negative OB ROS                             Anesthesia Physical Anesthesia Plan  ASA: III  Anesthesia  Plan: General ETT   Post-op Pain Management:  Regional for Post-op pain  Induction:   Airway Management Planned:   Additional Equipment:   Intra-op Plan:   Post-operative Plan:   Informed Consent: I have reviewed the patients History and Physical, chart, labs and discussed the procedure including the risks, benefits and alternatives for the proposed anesthesia with the patient or authorized representative who has indicated his/her understanding and acceptance.   Dental Advisory Given  Plan Discussed with: Anesthesiologist, CRNA and Surgeon  Anesthesia Plan Comments:         Anesthesia Quick Evaluation

## 2015-09-22 DIAGNOSIS — S46002A Unspecified injury of muscle(s) and tendon(s) of the rotator cuff of left shoulder, initial encounter: Secondary | ICD-10-CM | POA: Insufficient documentation

## 2015-10-13 DIAGNOSIS — Z9889 Other specified postprocedural states: Secondary | ICD-10-CM | POA: Insufficient documentation

## 2016-07-04 LAB — HM DIABETES EYE EXAM

## 2016-11-22 LAB — LIPID PANEL
CHOLESTEROL: 202 — AB (ref 0–200)
HDL: 53 (ref 35–70)
LDL CALC: 135
TRIGLYCERIDES: 71 (ref 40–160)

## 2016-11-22 LAB — HEPATIC FUNCTION PANEL
ALK PHOS: 73 (ref 25–125)
ALT: 22 (ref 10–40)
AST: 19 (ref 14–40)

## 2016-11-22 LAB — VITAMIN B12: Vitamin B-12: 420

## 2016-11-22 LAB — BASIC METABOLIC PANEL
BUN: 15 (ref 4–21)
Creatinine: 1.2 (ref 0.6–1.3)
Glucose: 189
Potassium: 4 (ref 3.4–5.3)
Sodium: 138 (ref 137–147)

## 2016-11-22 LAB — TSH: TSH: 19.09 — AB (ref 0.41–5.90)

## 2016-11-22 LAB — HEMOGLOBIN A1C: Hemoglobin A1C: 9.9

## 2016-12-22 ENCOUNTER — Other Ambulatory Visit: Payer: Self-pay | Admitting: Neurology

## 2016-12-22 DIAGNOSIS — Z87820 Personal history of traumatic brain injury: Secondary | ICD-10-CM | POA: Insufficient documentation

## 2016-12-30 ENCOUNTER — Ambulatory Visit
Admission: RE | Admit: 2016-12-30 | Discharge: 2016-12-30 | Disposition: A | Discharge status: Home / Self Care | Payer: BLUE CROSS/BLUE SHIELD | Source: Ambulatory Visit | Attending: Neurology | Admitting: Neurology

## 2016-12-30 DIAGNOSIS — Z87820 Personal history of traumatic brain injury: Secondary | ICD-10-CM

## 2017-01-04 ENCOUNTER — Encounter: Payer: Self-pay | Admitting: Family Medicine

## 2017-01-10 ENCOUNTER — Ambulatory Visit
Admission: RE | Admit: 2017-01-10 | Discharge: 2017-01-10 | Disposition: A | Discharge status: Home / Self Care | Payer: BLUE CROSS/BLUE SHIELD | Source: Ambulatory Visit | Attending: Neurology | Admitting: Neurology

## 2017-01-10 DIAGNOSIS — Z87820 Personal history of traumatic brain injury: Secondary | ICD-10-CM | POA: Insufficient documentation

## 2017-10-01 ENCOUNTER — Other Ambulatory Visit: Payer: Self-pay

## 2017-10-01 ENCOUNTER — Emergency Department
Admission: EM | Admit: 2017-10-01 | Discharge: 2017-10-01 | Disposition: A | Discharge status: Home / Self Care | Payer: BLUE CROSS/BLUE SHIELD | Attending: Emergency Medicine | Admitting: Emergency Medicine

## 2017-10-01 DIAGNOSIS — E039 Hypothyroidism, unspecified: Secondary | ICD-10-CM | POA: Insufficient documentation

## 2017-10-01 DIAGNOSIS — I10 Essential (primary) hypertension: Secondary | ICD-10-CM | POA: Diagnosis not present

## 2017-10-01 DIAGNOSIS — E119 Type 2 diabetes mellitus without complications: Secondary | ICD-10-CM | POA: Insufficient documentation

## 2017-10-01 DIAGNOSIS — Z79899 Other long term (current) drug therapy: Secondary | ICD-10-CM | POA: Insufficient documentation

## 2017-10-01 DIAGNOSIS — K439 Ventral hernia without obstruction or gangrene: Secondary | ICD-10-CM | POA: Diagnosis not present

## 2017-10-01 DIAGNOSIS — Z91010 Allergy to peanuts: Secondary | ICD-10-CM | POA: Insufficient documentation

## 2017-10-01 DIAGNOSIS — Z7984 Long term (current) use of oral hypoglycemic drugs: Secondary | ICD-10-CM | POA: Insufficient documentation

## 2017-10-01 DIAGNOSIS — R1033 Periumbilical pain: Secondary | ICD-10-CM | POA: Diagnosis present

## 2017-10-01 LAB — URINALYSIS, COMPLETE (UACMP) WITH MICROSCOPIC
BILIRUBIN URINE: NEGATIVE
Glucose, UA: 150 mg/dL — AB
Hgb urine dipstick: NEGATIVE
KETONES UR: NEGATIVE mg/dL
LEUKOCYTES UA: NEGATIVE
NITRITE: NEGATIVE
PH: 7 (ref 5.0–8.0)
Protein, ur: 30 mg/dL — AB
Specific Gravity, Urine: 1.013 (ref 1.005–1.030)

## 2017-10-01 LAB — COMPREHENSIVE METABOLIC PANEL
ALT: 19 U/L (ref 17–63)
ANION GAP: 6 (ref 5–15)
AST: 19 U/L (ref 15–41)
Albumin: 4 g/dL (ref 3.5–5.0)
Alkaline Phosphatase: 65 U/L (ref 38–126)
BUN: 12 mg/dL (ref 6–20)
CO2: 28 mmol/L (ref 22–32)
Calcium: 8.8 mg/dL — ABNORMAL LOW (ref 8.9–10.3)
Chloride: 103 mmol/L (ref 101–111)
Creatinine, Ser: 1.05 mg/dL (ref 0.61–1.24)
Glucose, Bld: 248 mg/dL — ABNORMAL HIGH (ref 65–99)
POTASSIUM: 4.1 mmol/L (ref 3.5–5.1)
Sodium: 137 mmol/L (ref 135–145)
Total Bilirubin: 0.6 mg/dL (ref 0.3–1.2)
Total Protein: 7.6 g/dL (ref 6.5–8.1)

## 2017-10-01 LAB — CBC
HEMATOCRIT: 48.7 % (ref 40.0–52.0)
HEMOGLOBIN: 16.6 g/dL (ref 13.0–18.0)
MCH: 29.8 pg (ref 26.0–34.0)
MCHC: 34 g/dL (ref 32.0–36.0)
MCV: 87.6 fL (ref 80.0–100.0)
Platelets: 214 10*3/uL (ref 150–440)
RBC: 5.56 MIL/uL (ref 4.40–5.90)
RDW: 13.6 % (ref 11.5–14.5)
WBC: 4.4 10*3/uL (ref 3.8–10.6)

## 2017-10-01 LAB — LIPASE, BLOOD: Lipase: 36 U/L (ref 11–51)

## 2017-10-01 NOTE — ED Notes (Signed)
Abdominal binder applied.

## 2017-10-01 NOTE — ED Triage Notes (Signed)
Pt states hernia x 10-12 years but recently has become more "agravating". Hernia seen through shirt. R side of abd. Alert, oriented. In wheelchair.

## 2017-10-01 NOTE — ED Provider Notes (Signed)
[facility] Emergency Department Provider Note   ____________________________________________   First MD Initiated Contact with Patient 10/01/17 1305     (approximate)  I have reviewed the triage vital signs and the nursing notes.   HISTORY  Chief Complaint Hernia    HPI Shane Frazier Shane Digioia. is a 52 y.o. male here for evaluation of a hernia.  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  At times up to a couple times a week the hernia will be for again feeling very hard, and cause pain, sometimes this will last for a few minutes to 30 minutes and then seems to go away.  Reports same symptoms again occurred today, about an hour before he came in the hernia became hard, he began experiencing pain in his mid abdomen above the bellybutton and slightly over to the right side.  The pain is now since gone away, and the hernia is back to being soft.  Not associated nausea or vomiting.  No fevers or chills.  Reports symptoms come and go often, but seem to becoming slightly more frequent over the last several months.  Describes the situation as "aggravating", but reports at the present time all pain symptoms and the hardness of the hernia have gone away.   Past Medical History:  Diagnosis Date  . Diabetes mellitus without complication ([place])   . Difficult intubation   . High cholesterol   . Hypertension   . Hypothyroidism   . Seizures ([place]) 05-20-15   X 1-PT HIT HIS HEAD AT WORK AND THEN HAD A SEIZURE  . Thyroid disease     There are no active problems to display for this patient.   Past Surgical History:  Procedure Laterality Date  . HERNIA REPAIR    . SHOULDER ARTHROSCOPY WITH ROTATOR CUFF REPAIR AND SUBACROMIAL DECOMPRESSION Left 09/09/2015   Procedure: SHOULDER ARTHROSCOPY, SUBACROMIAL  DECOMPRESSION,BICEPS TENSON RELEASE AND MINI OPEN ROTATOR CUFF REPAIR;  Surgeon: Leanor Kail, MD;  Location: [facility] ORS;  Service: Orthopedics;  Laterality: Left;    Prior to Admission medications   Medication Sig Start Date End Date Taking? Authorizing Provider  atorvastatin (LIPITOR) 10 MG tablet Take 10 mg by mouth every morning.    [provider]  Cyanocobalamin (VITAMIN B 12 PO) Take 1 tablet by mouth every morning.    [provider]  diltiazem (MATZIM LA) 420 MG 24 hr tablet Take 420 mg by mouth every morning.    [provider]  glimepiride (AMARYL) 4 MG tablet Take 4 mg by mouth daily with breakfast.    [provider]  hydrochlorothiazide (HYDRODIURIL) 25 MG tablet Take 25 mg by mouth daily.    [provider]  levETIRAcetam (KEPPRA) 750 MG tablet Take 750 mg by mouth 2 (two) times daily.    [provider]  levothyroxine (SYNTHROID, LEVOTHROID) 100 MCG tablet Take 100 mcg by mouth daily before breakfast.    [provider]  lisinopril (PRINIVIL,ZESTRIL) 10 MG tablet Take 10 mg by mouth every morning.    [provider]  meloxicam (MOBIC) 15 MG tablet Take 15 mg by mouth daily.    [provider]  metFORMIN (GLUMETZA) 1000 MG (MOD) 24 hr tablet Take 1,000 mg by mouth daily.    [provider]  nortriptyline (PAMELOR) 10 MG capsule Take 10 mg by mouth at bedtime.    [provider]  ROXICET 5-325 MG tablet Take 1-2 tablets by mouth every 4 (four) hours as needed for severe pain. 09/09/15   Leanor Kail, MD    Allergies Peanut-containing drug products  History reviewed. No pertinent family history.  Social History Social History   Tobacco Use  . Smoking status: Never Smoker  . Smokeless tobacco: Current User    Types: Chew  Substance Use Topics  . Alcohol use: Yes    Comment: occasionally  . Drug use: No    Review of Systems Constitutional: No fever/chills Eyes: No  visual changes. ENT: No sore throat. Cardiovascular: Denies chest pain. Respiratory: Denies shortness of breath. Gastrointestinal: See HPI no nausea, no vomiting.  No diarrhea.  No constipation. Genitourinary: Negative for dysuria. Musculoskeletal: Negative for back pain. Skin: Negative for rash. Neurological: Negative for headaches.    ____________________________________________   PHYSICAL EXAM:  VITAL SIGNS: ED Triage Vitals  Enc Vitals Group     BP 10/01/17 1208 (!) 170/97     Pulse Rate 10/01/17 1208 86     Resp 10/01/17 1208 18     Temp 10/01/17 1208 98 F (36.7 C)     Temp Source 10/01/17 1208 Oral     SpO2 10/01/17 1208 98 %     Weight 10/01/17 1209 245 lb (111.1 kg)     Height 10/01/17 1209 5\' 7"  (1.702 m)     Head Circumference --      Peak Flow --      Pain Score 10/01/17 1209 5     Pain Loc --      Pain Edu? --      Excl. in [place]? --     Constitutional: Alert and oriented. Well appearing and in no acute distress. Eyes: Conjunctivae are normal. Head: Atraumatic. Nose: No congestion/rhinnorhea. Mouth/Throat: Mucous membranes are moist. Neck: No stridor.   Cardiovascular: Normal rate, regular rhythm. Grossly normal heart sounds.  Good peripheral circulation. Respiratory: Normal respiratory effort.  No retractions. Lungs CTAB. Gastrointestinal: Soft and nontender. No distention.  Patient has a soft midline ventral hernia just superior to the umbilicus.  It is presently nontender, soft, and fairly reducible but does recur immediately after reduction.  At the present time he reports no ongoing pain or symptoms.  There is no overlying redness.  It is not firm.  It is soft, nontender at this time. Musculoskeletal: No lower extremity tenderness nor edema. Neurologic:  Normal speech and language. No gross focal neurologic deficits are appreciated.  Skin:  Skin is warm, dry and intact. No rash noted. Psychiatric: Mood and affect are normal. Speech and behavior are  normal.  ____________________________________________   LABS (all labs ordered are listed, but only abnormal results are displayed)  Labs Reviewed  COMPREHENSIVE METABOLIC PANEL - Abnormal; Notable for the following components:      Result Value   Glucose, Bld 248 (*)    Calcium 8.8 (*)    All other components within normal limits  URINALYSIS, COMPLETE (UACMP) WITH MICROSCOPIC - Abnormal; Notable for the following components:   Color, Urine STRAW (*)    APPearance CLEAR (*)    Glucose, UA 150 (*)    Protein, ur 30 (*)    Bacteria, UA RARE (*)    All other components within normal limits  LIPASE, BLOOD  CBC   ____________________________________________  EKG   ____________________________________________  RADIOLOGY  No noted need  for imaging at this time.  Patient reports asymptomatic now, on examination does have a ventral hernia which is soft reducible and nontender at the present time, but does recur without pain or tenderness. ____________________________________________   PROCEDURES  Procedure(s) performed: None  Procedures  Critical Care performed: No  ____________________________________________   INITIAL IMPRESSION / ASSESSMENT AND PLAN / ED COURSE  Pertinent labs & imaging results that were available during my care of the patient were reviewed by me and considered in my medical decision making (see chart for details).  Patient presents for evaluation of a hernia.  Symptoms that he reports sound like he is having intermittent incarceration, but at the present time the seem to self resolved once again.  Very reassuring exam without evidence of strangulation, incarceration, or ongoing acute concern.  Patient presently pain-free and reports his symptoms of all once again resolved.  Case and care discussed with Dr. Tama High of surgery, he recommends placement of the patient into an abdominal binder, and that he would follow-up closely with the patient this  week.  Discussed with the patient was agreeable with this plan, advised no heavy lifting, and I discussed very careful abdominal pain return precautions with the patient and the plan for follow-up.  He is agreeable.  ----------------------------------------- 2:49 PM on 10/01/2017 -----------------------------------------  Patient remains asymptomatic, no recurrence of symptoms at this time.  Stable for discharge with careful return precautions.       ____________________________________________   FINAL CLINICAL IMPRESSION(S) / ED DIAGNOSES  Final diagnoses:  Ventral hernia without obstruction or gangrene      NEW MEDICATIONS STARTED DURING THIS VISIT:  New Prescriptions   No medications on file     Note:  This document was prepared using Dragon voice recognition software and may include unintentional dictation errors.     Delman Kitten, MD 10/01/17 1450

## 2017-10-01 NOTE — ED Notes (Signed)
[employer] called for binder.

## 2017-10-01 NOTE — Discharge Instructions (Addendum)
Follow-up with Dr. Rosana Hoes or one of his partners this week in their clinic, call tomorrow to schedule a follow-up.   Please return to the emergency room right away if you are to develop a fever, severe nausea, your pain becomes severe or worsens, you are unable to keep food down, begin vomiting any dark or bloody fluid, you develop any dark or bloody stools, feel dehydrated, or other new concerns or symptoms arise.

## 2017-10-04 DIAGNOSIS — I10 Essential (primary) hypertension: Secondary | ICD-10-CM | POA: Insufficient documentation

## 2017-10-05 ENCOUNTER — Encounter: Payer: Self-pay | Admitting: Surgery

## 2017-10-05 ENCOUNTER — Ambulatory Visit (INDEPENDENT_AMBULATORY_CARE_PROVIDER_SITE_OTHER): Payer: BLUE CROSS/BLUE SHIELD | Admitting: Surgery

## 2017-10-05 VITALS — BP 161/95 | HR 87 | Temp 98.1°F | Ht 67.0 in | Wt 234.0 lb

## 2017-10-05 DIAGNOSIS — K439 Ventral hernia without obstruction or gangrene: Secondary | ICD-10-CM | POA: Diagnosis not present

## 2017-10-05 NOTE — Patient Instructions (Signed)
You have requested to have a Ventral Hernia Repair. This will be done the second week of July by Dr.Piscoya at [facility]. We will call you with the exact date.   Please see your (BLUE) Pre-care sheet for more information.  You will need to arrange to be out of work for approximately 1-2 weeks and then you may return with a lifting restriction for 4 more weeks. If you have FMLA or Disability paperwork that needs to be filled out, please have your company fax your paperwork to [phone] or you may drop this by either office. This paperwork will be filled out within 3 days after your surgery has been completed.  Ventral Hernia A ventral hernia (also called an incisional hernia) is a hernia that occurs at the site of a previous surgical cut (incision) in the abdomen. The abdominal wall spans from your lower chest down to your pelvis. If the abdominal wall is weakened from a surgical incision, a hernia can occur. A hernia is a bulge of bowel or muscle tissue pushing out on the weakened part of the abdominal wall. Ventral hernias can get bigger from straining or lifting. Obese and older people are at higher risk for a ventral hernia. People who develop infections after surgery or require repeat incisions at the same site on the abdomen are also at increased risk. CAUSES  A ventral hernia occurs because of weakness in the abdominal wall at an incision site.  SYMPTOMS  Common symptoms include:  A visible bulge or lump on the abdominal wall.  Pain or tenderness around the lump.  Increased discomfort if you cough or make a sudden movement. If the hernia has blocked part of the intestine, a serious complication can occur (incarcerated or strangulated hernia). This can become a problem that requires emergency surgery because the blood flow to the blocked intestine may be cut off. Symptoms may include:  Feeling sick to your stomach (nauseous).  Throwing up (vomiting).  Stomach swelling (distention) or  bloating.  Fever.  Rapid heartbeat. DIAGNOSIS  Your health care provider will take a medical history and perform a physical exam. Various tests may be ordered, such as:  Blood tests.  Urine tests.  Ultrasonography.  X-rays.  Computed tomography (CT). TREATMENT  Watchful waiting may be all that is needed for a smaller hernia that does not cause symptoms. Your health care provider may recommend the use of a supportive belt (truss) that helps to keep the abdominal wall intact. For larger hernias or those that cause pain, surgery to repair the hernia is usually recommended. If a hernia becomes strangulated, emergency surgery needs to be done right away. HOME CARE INSTRUCTIONS  Avoid putting pressure or strain on the abdominal area.  Avoid heavy lifting.  Use good body positioning for physical tasks. Ask your health care provider about proper body positioning.  Use a supportive belt as directed by your health care provider.  Maintain a healthy weight.  Eat foods that are high in fiber, such as whole grains, fruits, and vegetables. Fiber helps prevent difficult bowel movements (constipation).  Drink enough fluids to keep your urine clear or pale yellow.  Follow up with your health care provider as directed. SEEK MEDICAL CARE IF:   Your hernia seems to be getting larger or more painful. SEEK IMMEDIATE MEDICAL CARE IF:   You have abdominal pain that is sudden and sharp.  Your pain becomes severe.  You have repeated vomiting.  You are sweating a lot.  You notice a  rapid heartbeat.  You develop a fever. MAKE SURE YOU:   Understand these instructions.  Will watch your condition.  Will get help right away if you are not doing well or get worse.   This information is not intended to replace advice given to you by your health care provider. Make sure you discuss any questions you have with your health care provider.   Document Released: 03/21/2012 Document Revised:  04/25/2014 Document Reviewed: 03/21/2012 Elsevier Interactive Patient Education 2016 Elsevier Inc.    Laparoscopic Ventral Hernia Repair Laparoscopic ventral hernia repairis a surgery to fix a ventral hernia. Aventral hernia, also called an incisional hernia, is a bulge of body tissue or intestines that pushes through the front part of the abdomen. This can happen if the connective tissue covering the muscles over the abdomen has a weak spot or is torn because of a surgical cut (incision) from a previous surgery. Laparoscopic ventral hernia repair is often done soon after diagnosis to stop the hernia from getting bigger, becoming uncomfortable, or becoming an emergency. This surgery usually takes about 2 hours, but the time can vary greatly. LET [facility] CARE PROVIDER KNOW ABOUT:  Any allergies you have.  All medicines you are taking, including steroids, vitamins, herbs, eye drops, creams, and over-the-counter medicines.  Previous problems you or members of your family have had with the use of anesthetics.  Any blood disorders you have.  Previous surgeries you have had.  Medical conditions you have. RISKS AND COMPLICATIONS  Generally, laparoscopic ventral hernia repair is a safe procedure. However, as with any surgical procedure, problems can occur. Possible problems include:  Bleeding.  Trouble passing urine or having a bowel movement after the surgery.  Infection.  Pneumonia.  Blood clots.  Pain in the area of the hernia.  A bulge in the area of the hernia that may be caused by a collection of fluid.  Injury to intestines or other structures in the abdomen.  Return of the hernia after surgery. In some cases, your health care provider may need to stop the laparoscopic procedure and do regular, open surgery. This may be necessary for very difficult hernias, when organs are hard to see, or when bleeding problems occur during surgery. BEFORE THE PROCEDURE   You may need  to have blood tests, urine tests, a chest X-ray, or an electrocardiogram done before the day of the surgery.  Ask your health care provider about changing or stopping your regular medicines. This is especially important if you are taking diabetes medicines or blood thinners.  You may need to wash with a special type of germ-killing soap.  Do not eat or drink anything after midnight the night before the procedure or as directed by your health care provider.  Make plans to have someone drive you home after the procedure. PROCEDURE   Small monitors will be put on your body. They are used to check your heart, blood pressure, and oxygen level.  An IV access tube will be put into a vein in your hand or arm. Fluids and medicine will flow directly into your body through the IV tube.  You will be given medicine that makes you go to sleep (general anesthetic).  Your abdomen will be cleaned with a special soap to kill any germs on your skin.  Once you are asleep, several small incisions will be made in your abdomen.  The large space in your abdomen will be filled with air so that it expands. This gives your  health care provider more room and a better view.  A thin, lighted tube with a tiny camera on the end (laparoscope) is put through a small incision in your abdomen. The camera on the laparoscope sends a picture to a TV screen in the operating room. This gives your health care provider a good view inside your abdomen.  Hollow tubes are put through the other small incisions in your abdomen. The tools needed for the procedure are put through these tubes.  Your health care provider puts the tissue or intestines that formed the hernia back in place.  A screen-like patch (mesh) is used to close the hernia. This helps make the area stronger. Stitches, tacks, or staples are used to keep the mesh in place.  Medicine and a bandage (dressing) or skin glue will be put over the incisions. AFTER THE  PROCEDURE   You will stay in a recovery area until the anesthetic wears off. Your blood pressure and pulse will be checked often.  You may be able to go home the same day or may need to stay in the hospital for 1-2 days after surgery. Your health care provider will decide when you can go home.  You may feel some pain. You may be given medicine for pain.  You will be urged to do breathing exercises that involve taking deep breaths. This helps prevent a lung infection after a surgery.  You may have to wear compression stockings while you are in the hospital. These stockings help keep blood clots from forming in your legs.   This information is not intended to replace advice given to you by your health care provider. Make sure you discuss any questions you have with your health care provider.   Document Released: 03/21/2012 Document Revised: 04/09/2013 Document Reviewed: 03/21/2012 Elsevier Interactive Patient Education [employer].

## 2017-10-05 NOTE — Progress Notes (Signed)
Medical Clearance faxed to Dr.Bliss at this time.

## 2017-10-05 NOTE — Progress Notes (Signed)
10/05/2017  Reason for Visit: Symptomatic ventral hernia  History of Present Illness: Shane Frazier. is a 52 y.o. male who presents with with a long history of a symptomatic ventral hernia.  Patient reports that he had an umbilical hernia repair as a child but otherwise has not had other abdominal surgeries.  He is noticed this hernia for several years which initially was asymptomatic and he did not think much of it before.  He does notice that more recently however as he is doing heavy lifting or applying any abdominal stress, that the hernia bulges more and at that point it becomes more aggravating.  He does have episodes of some pain that goes to about 6 or an 8 out of 10 which is short lasting for about only half an hour.  He did present to the emergency room last week on 6/16 because his hernia was more symptomatic that day.  However by the time he was evaluated by emergency room physician, his pain was resolved.  His labs are all normal and he was referred to surgery for further evaluation.  He reports that during his episodes of pain, there is no nausea or vomiting.  He denies any constipation or obstructive symptoms.  His main issue is pain when the hernia bulges more.  Denies any fevers at home but does report feeling hot when the hernia is causing more symptoms.  He denies any chest pain, shortness of breath, nausea, vomiting.  Denies any skin discoloration or bruising around the hernia site.  Past Medical History: Past Medical History:  Diagnosis Date  . Diabetes mellitus without complication ([place])   . Difficult intubation   . High cholesterol   . Hypertension   . Hypothyroidism   . Seizures ([place]) 05-20-15   X 1-PT HIT HIS HEAD AT WORK AND THEN HAD A SEIZURE  . Thyroid disease      Past Surgical History: Past Surgical History:  Procedure Laterality Date  . HERNIA REPAIR    . SHOULDER ARTHROSCOPY WITH ROTATOR CUFF REPAIR AND SUBACROMIAL DECOMPRESSION Left 09/09/2015   Procedure:  SHOULDER ARTHROSCOPY, SUBACROMIAL DECOMPRESSION,BICEPS TENSON RELEASE AND MINI OPEN ROTATOR CUFF REPAIR;  Surgeon: Leanor Kail, MD;  Location: [facility] ORS;  Service: Orthopedics;  Laterality: Left;    Home Medications: Prior to Admission medications   Medication Sig Start Date End Date Taking? Authorizing Provider  atorvastatin (LIPITOR) 10 MG tablet Take 10 mg by mouth every morning.    [provider]  Cyanocobalamin (VITAMIN B 12 PO) Take 1 tablet by mouth every morning.    [provider]  diazepam (VALIUM) 5 MG tablet Take 1 tab 30 minutes prior to MRI, then take 1 tab right before MRI.  Needs driver 10/15/50   [provider]  diltiazem (MATZIM LA) 420 MG 24 hr tablet Take 420 mg by mouth every morning.    [provider]  diltiazem (TIAZAC) 420 MG 24 hr capsule Take by mouth.    [provider]  glimepiride (AMARYL) 4 MG tablet Take 4 mg by mouth daily with breakfast.    [provider]  hydrochlorothiazide (HYDRODIURIL) 25 MG tablet Take 25 mg by mouth daily.    [provider]  levETIRAcetam (KEPPRA) 750 MG tablet Take 750 mg by mouth 2 (two) times daily.    [provider]  levothyroxine (SYNTHROID, LEVOTHROID) 100 MCG tablet Take 100 mcg by mouth daily before breakfast.    [provider]  lisinopril (PRINIVIL,ZESTRIL) 10 MG tablet  Take 10 mg by mouth every morning.    [provider]  meloxicam (MOBIC) 15 MG tablet Take 15 mg by mouth daily.    [provider]  metFORMIN (GLUMETZA) 1000 MG (MOD) 24 hr tablet Take 1,000 mg by mouth daily.    [provider]  nortriptyline (PAMELOR) 10 MG capsule Take 10 mg by mouth at bedtime.    [provider]  ROXICET 5-325 MG tablet Take 1-2 tablets by mouth every 4 (four) hours as needed for severe pain. Patient not taking: Reported on 10/05/2017 09/09/15   Leanor Kail, MD  sildenafil (VIAGRA) 50 MG tablet Take by mouth. 07/01/15    [provider]    Allergies: Allergies  Allergen Reactions  . Peanut Oil Swelling    BRAZILIAN NUTS ONLY-"THROAT CLOSES UP"  . Peanut-Containing Drug Products Swelling    BRAZILIAN NUTS ONLY-"THROAT CLOSES UP"    Social History:  reports that he has never smoked. His smokeless tobacco use includes chew. He reports that he drinks alcohol. He reports that he does not use drugs.   Family History: Family History  Problem Relation Age of Onset  . Hypertension Mother   . Cancer Father   . Diabetes Sister   . Thyroid cancer Sister     Review of Systems: Review of Systems  Constitutional: Negative for chills and fever.  HENT: Negative for hearing loss.   Respiratory: Negative for shortness of breath.   Cardiovascular: Negative for chest pain.  Gastrointestinal: Positive for abdominal pain. Negative for constipation, diarrhea, nausea and vomiting.  Genitourinary: Negative for dysuria.  Musculoskeletal: Negative for myalgias.  Skin: Negative for rash.  Neurological: Negative for dizziness.  Psychiatric/Behavioral: Negative for depression.  All other systems reviewed and are negative.   Physical Exam BP (!) 161/95   Pulse 87   Temp 98.1 F (36.7 C) (Oral)   Ht 5\' 7"  (1.702 m)   Wt 234 lb (106.1 kg)   BMI 36.65 kg/m  CONSTITUTIONAL: No acute distress HEENT:  Normocephalic, atraumatic, extraocular motion intact. NECK: Trachea is midline, and there is no jugular venous distension.  RESPIRATORY:  Lungs are clear, and breath sounds are equal bilaterally. Normal respiratory effort without pathologic use of accessory muscles. CARDIOVASCULAR: Heart is regular without murmurs, gallops, or rubs. GI: The abdomen is soft, obese, nondistended, nontender to palpation.  Patient has approximately an area of 6 cm supraumbilical consistent with his hernia.  It is partially reducible and was nontender with manipulation.  No skin discoloration or changes over the hernia.  Patient  has a well-healed scar infraumbilical consistent with his prior umbilical hernia repair as a child. MUSCULOSKELETAL:  Normal muscle strength and tone in all four extremities.  No peripheral edema or cyanosis. SKIN: Skin turgor is normal. There are no pathologic skin lesions.  NEUROLOGIC:  Motor and sensation is grossly normal.  Cranial nerves are grossly intact. PSYCH:  Alert and oriented to person, place and time. Affect is normal.  Laboratory Analysis: Labs from 6/16: WBC 4.4, hemoglobin 16.6, hematocrit 48.7, platelet count 214.  Sodium 137, potassium 4.1, chloride 103, CO2 28, BUN 12, creatinine 1.05, glucose 248.  LFTs within normal.  Imaging: None  Assessment and Plan: This is a 52 y.o. male who presents with a symptomatic ventral hernia.  Discussed with the patient that based on his symptoms, it is appropriate to proceed with surgical repair.  Did discuss the importance of cutting down if not quitting his tobacco chewing as well as controlling his  glucose more tightly in order to provide the best conditions for a good outcome with his surgery.  Discussed the options for an open versus laparoscopic approach and he opted for a laparoscopic ventral hernia repair.  He does understand that because his hernia is only partially reducible, if there is any indication that bowel is to stalk that we will cause any injury while trying to manipulate it, then we would proceed with an open approach instead.  However I think it is reasonable to start with a laparoscopic approach with mesh.  Risks and benefits of the procedure were discussed with the patient as well as postoperative outcomes and expectations as well as restrictions, particularly no heavy lifting or pushing of no more than 10 to 15 pounds for peer to 4 weeks.  The patient understands this plan and he is willing to proceed.  All of his questions have been answered.  We will book him for the second week of July.  Face-to-face time spent with  the patient and care providers was 45 minutes, with more than 50% of the time spent counseling, educating, and coordinating care of the patient.     Melvyn Neth, [place]

## 2017-10-10 ENCOUNTER — Telehealth: Payer: Self-pay | Admitting: Surgery

## 2017-10-10 NOTE — Telephone Encounter (Signed)
I have called patient to go over surgery information below. No answer. I have left a message on the voicemail.    pre op date/time and sx date. Sx: 10/23/17 with Dr Piscoya-laparoscopic ventral hernia repair with mesh.  Pre op: 10/16/17 @ 10:45am--office interview.   Advise patient to call [phone], between 1-3:00pm the day before surgery, to find out what time to arrive.

## 2017-10-11 NOTE — Telephone Encounter (Signed)
Patient called and left a voicemail yesterday at 4:52pm he was returning your call to receive his his surgery information. Please call patient and advise.

## 2017-10-11 NOTE — Telephone Encounter (Signed)
Patient given surgery information.

## 2017-10-11 NOTE — Telephone Encounter (Signed)
Please call patient, when able, to inform him of below information please.

## 2017-10-16 ENCOUNTER — Other Ambulatory Visit: Payer: Self-pay

## 2017-10-16 ENCOUNTER — Encounter
Admission: RE | Admit: 2017-10-16 | Discharge: 2017-10-16 | Disposition: A | Discharge status: Home / Self Care | Payer: BLUE CROSS/BLUE SHIELD | Source: Ambulatory Visit | Attending: Surgery | Admitting: Surgery

## 2017-10-16 DIAGNOSIS — Z720 Tobacco use: Secondary | ICD-10-CM | POA: Diagnosis not present

## 2017-10-16 DIAGNOSIS — E039 Hypothyroidism, unspecified: Secondary | ICD-10-CM | POA: Insufficient documentation

## 2017-10-16 DIAGNOSIS — Z9889 Other specified postprocedural states: Secondary | ICD-10-CM | POA: Insufficient documentation

## 2017-10-16 DIAGNOSIS — K439 Ventral hernia without obstruction or gangrene: Secondary | ICD-10-CM | POA: Insufficient documentation

## 2017-10-16 DIAGNOSIS — E119 Type 2 diabetes mellitus without complications: Secondary | ICD-10-CM | POA: Insufficient documentation

## 2017-10-16 DIAGNOSIS — Z79899 Other long term (current) drug therapy: Secondary | ICD-10-CM | POA: Diagnosis not present

## 2017-10-16 DIAGNOSIS — Z808 Family history of malignant neoplasm of other organs or systems: Secondary | ICD-10-CM | POA: Insufficient documentation

## 2017-10-16 DIAGNOSIS — I10 Essential (primary) hypertension: Secondary | ICD-10-CM | POA: Diagnosis not present

## 2017-10-16 DIAGNOSIS — Z01810 Encounter for preprocedural cardiovascular examination: Secondary | ICD-10-CM | POA: Diagnosis not present

## 2017-10-16 DIAGNOSIS — R9431 Abnormal electrocardiogram [ECG] [EKG]: Secondary | ICD-10-CM | POA: Insufficient documentation

## 2017-10-16 DIAGNOSIS — Z8249 Family history of ischemic heart disease and other diseases of the circulatory system: Secondary | ICD-10-CM | POA: Insufficient documentation

## 2017-10-16 DIAGNOSIS — Z7984 Long term (current) use of oral hypoglycemic drugs: Secondary | ICD-10-CM | POA: Insufficient documentation

## 2017-10-16 DIAGNOSIS — Z833 Family history of diabetes mellitus: Secondary | ICD-10-CM | POA: Insufficient documentation

## 2017-10-16 HISTORY — DX: Concussion with loss of consciousness of unspecified duration, initial encounter: S06.0X9A

## 2017-10-16 NOTE — Patient Instructions (Signed)
Your procedure is scheduled on: Monday, October 23, 2017  Report to [facility]  To find out your arrival time please call [phone] between 1PM - 3PM on Friday, July 5TH   Remember: Instructions that are not followed completely may result in serious medical risk,  up to and including death, or upon the discretion of your surgeon and anesthesiologist your  surgery may need to be rescheduled.     _X__ 1. Do not eat food after midnight the night before your procedure.                 No gum chewing or hard candies.                  You may drink clear liquids up to 2 hours before you are scheduled to arrive for your surgery-                   DO not drink clear liquids within 2 hours of the start of your surgery.                  Clear Liquids include:  water, apple juice without pulp, clear carbohydrate                 drink such as Clearfast of Gatorade, Black Coffee or Tea (Do not add                 anything to coffee or tea).  __X__2.  On the morning of surgery brush your teeth with toothpaste and water, you                may rinse your mouth with mouthwash if you wish.                    Do not swallow any toothpaste of mouthwash.     _X__ 3.  No Alcohol for 24 hours before or after surgery.   _X__ 4.  Do Not Smoke or use e-cigarettes For 24 Hours Prior to Your Surgery.                 Do not use any chewable tobacco products for at least 6 hours prior to                 surgery.  ____  5.  Bring all medications with you on the day of surgery if instructed.   ____  6.  Notify your doctor if there is any change in your medical condition      (cold, fever, infections).     Do not wear jewelry, make-up, hairpins, clips or nail polish. Do not wear lotions, powders, or perfumes. You may wear deodorant. Do not shave 48 hours prior to surgery. Men may shave face and neck. Do not bring valuables to the hospital.    [facility] is  not responsible for any belongings or valuables.  Contacts, dentures or bridgework may not be worn into surgery. Leave your suitcase in the car. After surgery it may be brought to your room. For patients admitted to the hospital, discharge time is determined by your treatment team.   Patients discharged the day of surgery will not be allowed to drive home.   Please read over the following fact sheets that you were given:   PREPARING FOR SURGERY  ADVANCE DIRECTIVES  ____ Take these medicines the morning of surgery with A SIP OF WATER:    1. LIPITOR  2. LEVOTHYROXINE  3.   4.  5.  6.  ____ Fleet Enema (as directed)   _X___ Use CHG Soap as directed  ____ Use inhalers on the day of surgery  _X___ Stop metformin 2 days prior to surgery. LAST DOSE ON Friday, [place] OF July.                DO NOT TAKE GLIMEPIRIDE ON THE DAY OF SURGERY    ____ Stop ALL ASPIRIN PRODUCTS AS OF TODAY             THIS INCLUDES BC POWDER / GOODIES POWDER / EXCEDRIN  _X___ Stop Anti-inflammatories AS OF TODAY             THIS INCLUDES  IBUPROFEN / MOTRIN / ADVIL / ALEVE                    YOU MAY TAKE TYLENOL UP UNTIL SURGERY.   ____ Stop supplements until after surgery.    ____ Bring C-Pap to the hospital.   CONTINUE TO TAKE HCTZ AND LISINOPRIL AS USUAL. DO NOT TAKE ON THE MORNING OF SURGERY  DO NOT TAKE GLIMEPIRIDE ON THE MORNING OF SURGERY.  IF YOU COMPLETE THE ADVANCE DIRECTIVES, PLEASE BRING WITH YOU TO THE HOSPITAL.  WEAR LOOSE FITTING CLOTHING FOR DISCHARGE.  HAVE STOOL SOFTENERS AVAILABLE FOR USE AFTER SURGERY

## 2017-10-17 ENCOUNTER — Telehealth: Payer: Self-pay

## 2017-10-17 LAB — BASIC METABOLIC PANEL
BUN: 17 (ref 4–21)
Creatinine: 1.4 — AB (ref 0.6–1.3)
GLUCOSE: 141
POTASSIUM: 3.7 (ref 3.4–5.3)
SODIUM: 141 (ref 137–147)

## 2017-10-17 LAB — HEPATIC FUNCTION PANEL
ALK PHOS: 59 (ref 25–125)
ALT: 18 (ref 10–40)
AST: 21 (ref 14–40)
Bilirubin, Total: 0.3

## 2017-10-17 LAB — MICROALBUMIN, URINE: Microalb, Ur: 30

## 2017-10-17 LAB — HEMOGLOBIN A1C: Hemoglobin A1C: 9.8

## 2017-10-17 NOTE — Pre-Procedure Instructions (Signed)
Value Ref Range  EKG Systolic BP  mmHg  EKG Diastolic BP  mmHg  EKG Ventricular Rate 94 BPM  EKG Atrial Rate 94 BPM  EKG P-R Interval 170 ms  EKG QRS Duration 106 ms  EKG Q-T Interval 368 ms  EKG QTC Calculation 460 ms  EKG Calculated P Axis 30 degrees  EKG Calculated R Axis 0 degrees  EKG Calculated T Axis -17 degrees  Result Narrative  NORMAL SINUS RHYTHM NONSPECIFIC T WAVE ABNORMALITY PROLONGED QT NO PREVIOUS ECGS AVAILABLE Confirmed by Ishmael Holter MD, [place] [phone]) on 05/20/2015 8:35:08 PM  Other Result Information  Interface, Rad Results In - 05/20/2015  8:34 PM EST NORMAL SINUS RHYTHM NONSPECIFIC T WAVE ABNORMALITY PROLONGED QT NO PREVIOUS ECGS AVAILABLE Confirmed by Ishmael Holter MD, [place] [phone]) on 05/20/2015 8:35:08 PM  Status Results Details   Encounter Summary

## 2017-10-17 NOTE — Telephone Encounter (Signed)
Spoke with receptionist at [facility] office. She stated the patient was seen yesterday and that pre-admit had faxed over Cardiac clearance and abnormal EKG for review as well. Once Dr.Bliss has looked over the EKG she will let us know.

## 2017-10-17 NOTE — Pre-Procedure Instructions (Signed)
REQUESTFOR CLEARANCE/EKG Called and faxed to dr l bliss and lm and faxed to dr Hampton Abbot

## 2017-10-17 NOTE — Telephone Encounter (Signed)
Sherry from Pre-Admit called stating that the patient needs to have medical clearance before his surgery 10/23/2017 due to his EKG.

## 2017-10-18 NOTE — Pre-Procedure Instructions (Signed)
RECEIVED BY DR BLISS, REFERRING PATIENT FOR CARDIAC CLEARANCE. NOTIFIED ANGIE AT DR Hampton Abbot

## 2017-10-20 NOTE — Pre-Procedure Instructions (Signed)
Telephone call to Dr. Hampton Abbot office.  Office was closed. [facility] regarding info pertaining to cardiac referral.  Telephone call to Dr. Clemmie Krill.  Office was closed.  [facility] regarding cardiac referral.  Telephone call to patient.  He was told on Wednesday that Dr. Clemmie Krill would be referring him to a cardiologist but has not heard anything since. Patient is aware that their office was closed. He will follow up with PCP on Monday, July 8th.  Surgery to be cancelled until cardiologist clears him. Will follow up with Leah, OR to inform her of situation.

## 2017-10-23 NOTE — Pre-Procedure Instructions (Signed)
SPOKE WITH CYNTHIA AT DR BLISS SHE WILL TRY TO GET REFERRAL TO CARDIOLOGY SET IUP

## 2017-10-23 NOTE — Telephone Encounter (Signed)
Medical Clearance was not granted to the patient per Dr Clemmie Krill. Dr Clemmie Krill has referred the patient to see Cardiology. Patient's surgery has been cancelled at this time. Dr Hampton Abbot was made aware.   Cardiology Appointment with Dr End is on 11/01/17. Dr Eula Flax office made this appointment. I'm unsure if Dr Hampton Abbot will want [place] to try to get the patient in sooner due to Dr Hampton Abbot being out of office for 6 weeks after July 22. Once patient is cleared we can reschedule surgery. Please advise.

## 2017-10-23 NOTE — Telephone Encounter (Signed)
Thanks for following up on the medical clearance.  I agree with Dr. Clemmie Krill to have the patient see a cardiologist given the abnormal EKG.  If possible to schedule him sooner with Dr. Saunders Revel, that would be great, otherwise the patient would have to be seen by one of my partners to do his surgery in the future.

## 2017-10-24 ENCOUNTER — Telehealth: Payer: Self-pay

## 2017-10-24 ENCOUNTER — Telehealth: Payer: Self-pay | Admitting: Cardiovascular Disease

## 2017-10-24 NOTE — Telephone Encounter (Signed)
° °  [name] Pre-operative Risk Assessment    Request for surgical clearance:  1. What type of surgery is being performed? Laparoscopic Ventral Herina   2. When is this surgery scheduled? TBA   3. What type of clearance is required (medical clearance vs. Pharmacy clearance to hold med vs. Both)? Both   4. Are there any medications that need to be held prior to surgery and how long? Not listed   5. Practice name and name of physician performing surgery? [facility] Dr Shane Frazier   6. What is your office phone number [phone]   7.   What is your office fax number [phone]  8.   Anesthesia type (None, local, MAC, general) ? Not listed

## 2017-10-24 NOTE — Telephone Encounter (Signed)
Cardiac Clearance faxed to Dr.End-patient has appointment 11/01/17 @ 3 pm.

## 2017-10-24 NOTE — Telephone Encounter (Signed)
Patient notified of appointment 10/25/17 Dr.End @ 11:30 am.

## 2017-10-24 NOTE — Telephone Encounter (Signed)
Patient has New Patient appointment with Dr Rockey Situ tomorrow, 10/25/17. Routing to Dr Rockey Situ.

## 2017-10-25 ENCOUNTER — Encounter: Payer: Self-pay | Admitting: Cardiovascular Disease

## 2017-10-25 ENCOUNTER — Ambulatory Visit (INDEPENDENT_AMBULATORY_CARE_PROVIDER_SITE_OTHER): Payer: BLUE CROSS/BLUE SHIELD | Admitting: Cardiovascular Disease

## 2017-10-25 VITALS — BP 140/90 | HR 88 | Ht 67.5 in | Wt 228.5 lb

## 2017-10-25 DIAGNOSIS — I10 Essential (primary) hypertension: Secondary | ICD-10-CM | POA: Diagnosis not present

## 2017-10-25 DIAGNOSIS — K436 Other and unspecified ventral hernia with obstruction, without gangrene: Secondary | ICD-10-CM | POA: Insufficient documentation

## 2017-10-25 DIAGNOSIS — R9431 Abnormal electrocardiogram [ECG] [EKG]: Secondary | ICD-10-CM | POA: Diagnosis not present

## 2017-10-25 DIAGNOSIS — K439 Ventral hernia without obstruction or gangrene: Secondary | ICD-10-CM | POA: Diagnosis not present

## 2017-10-25 DIAGNOSIS — Z01810 Encounter for preprocedural cardiovascular examination: Secondary | ICD-10-CM

## 2017-10-25 DIAGNOSIS — E119 Type 2 diabetes mellitus without complications: Secondary | ICD-10-CM

## 2017-10-25 DIAGNOSIS — E11319 Type 2 diabetes mellitus with unspecified diabetic retinopathy without macular edema: Secondary | ICD-10-CM | POA: Insufficient documentation

## 2017-10-25 NOTE — Patient Instructions (Signed)

## 2017-10-25 NOTE — Progress Notes (Signed)
Cardiology Office Note  Date:  10/25/2017   ID:  Shane Picket., DOB [birth year], MRN [id number]  PCP:  Shane Jude, MD   Chief Complaint  Patient presents with  . other    Ref by Dr. Olean Frazier for cardiac clearance for laparoscopic ventral hernia repair which is scheduled for July 2019 with Shane Frazier. Denies chest pain or shortness of breath. Pt. c/o blood pressure being elevated.      HPI:  Shane Frazier is a 52 year old gentleman with past medical history of Symptomatic ventral hernia  no smoking H/a, CT scan neg Diabetes 2 hypertension Who presents by referral from Dr. Suzie Frazier for preoperative evaluation for laparoscopic ventral hernia repair  Recently seen in preop noted to have abnormal EKG with nonspecific T wave abnormalities  He denies any cardiac history Good exercise tolerance Works as a Pharmacist, community, frequently lifting heavy gas hoses, but that heavy lifting Active Outside of work [employer] dog Denies any chest pain or shortness of breath on exertion  He would like to have ventral hernia repaired givenworsening discomfort Thinks he has had the hernia for 10 years or so may be more,   EKG personally reviewed by myself on todays visit Shows nonspecific T wave abnormality V5 V6 lead 3 and aVF, Dramatically improved compared to previous EKGs   PMH:   has a past medical history of Concussion (2017), Diabetes mellitus without complication (Shane Frazier), Difficult intubation, High cholesterol, Hypertension, Hypothyroidism, Seizures (Shane Frazier) (05-20-15), and Thyroid disease.  PSH:    Past Surgical History:  Procedure Laterality Date  . HERNIA REPAIR     as a child,umbilical  . SHOULDER ARTHROSCOPY WITH ROTATOR CUFF REPAIR AND SUBACROMIAL DECOMPRESSION Left 09/09/2015   Procedure: SHOULDER ARTHROSCOPY, SUBACROMIAL DECOMPRESSION,BICEPS TENSON RELEASE AND MINI OPEN ROTATOR CUFF REPAIR;  Surgeon: Shane Kail, MD;  Location: [facility] ORS;  Service: Orthopedics;   Laterality: Left;    Current Outpatient Medications  Medication Sig Dispense Refill  . atorvastatin (LIPITOR) 20 MG tablet Take 20 mg by mouth every morning.     . diltiazem (MATZIM LA) 420 MG 24 hr tablet Take 420 mg by mouth every morning.    Marland Kitchen glimepiride (AMARYL) 4 MG tablet Take 4 mg by mouth daily with breakfast.    . hydrochlorothiazide (HYDRODIURIL) 25 MG tablet Take 25 mg by mouth daily.    Marland Kitchen levothyroxine (SYNTHROID, LEVOTHROID) 200 MCG tablet Take 200 mcg by mouth daily before breakfast.     . lisinopril (PRINIVIL,ZESTRIL) 20 MG tablet Take 20 mg by mouth every morning.     . metFORMIN (GLUCOPHAGE) 1000 MG tablet Take 1,000 mg by mouth 2 (two) times daily with a meal.    . ROXICET 5-325 MG tablet Take 1-2 tablets by mouth every 4 (four) hours as needed for severe pain. (Patient not taking: Reported on 10/25/2017) 30 tablet 0   No current facility-administered medications for this visit.      Allergies:   Other   Social History:  The patient  reports that he has never smoked. His smokeless tobacco use includes chew. He reports that he drinks alcohol. He reports that he does not use drugs.   Family History:   family history includes Cancer in his father; Diabetes in his sister; Hypertension in his mother; Thyroid cancer in his sister.    Review of Systems: Review of Systems  Constitutional: Negative.   Respiratory: Negative.   Cardiovascular: Negative.   Gastrointestinal: Positive for abdominal pain.  Musculoskeletal:  Negative.   Neurological: Negative.   Psychiatric/Behavioral: Negative.   All other systems reviewed and are negative.    PHYSICAL EXAM: VS:  BP 140/90 (BP Location: Right Arm, Patient Position: Sitting, Cuff Size: Normal)   Pulse 88   Ht 5' 7.5" (1.715 m)   Wt 228 lb 8 oz (103.6 kg)   BMI 35.26 kg/m  , BMI Body mass index is 35.26 kg/m. GEN: Well nourished, well developed, in no acute distress  HEENT: normal  Neck: no JVD, carotid bruits, or  masses Cardiac: RRR; no murmurs, rubs, or gallops,no edema  Respiratory:  clear to auscultation bilaterally, normal work of breathing GI: soft, nontender, nondistended, + BS Ventral hernia noted MS: no deformity or atrophy  Skin: warm and dry, no rash Neuro:  Strength and sensation are intact Psych: euthymic mood, full affect    Recent Labs: 10/01/2017: ALT 19; BUN 12; Creatinine, Ser 1.05; Hemoglobin 16.6; Platelets 214; Potassium 4.1; Sodium 137    Lipid Panel No results found for: CHOL, HDL, LDLCALC, TRIG    Wt Readings from Last 3 Encounters:  10/25/17 228 lb 8 oz (103.6 kg)  10/16/17 230 lb 1 oz (104.4 kg)  10/05/17 234 lb (106.1 kg)       ASSESSMENT AND PLAN:  Type 2 diabetes mellitus without complication, without long-term current use of insulin (HCC) - Plan: EKG 12-Lead Periodically with medication noncompliance Feels his sugars are running high Recommended he stay on his indications Discussed complications of diabetes  Abnormal EKG - Plan: EKG 12-Lead Minimal T-wave abnormality V5 and V6 No prior cardiac history Unable to exclude LVH as a cause of his abnormality Stressed importance of aggressive diabetes and blood pressure control Denies any anginal symptoms No further testing at this time  Ventral hernia without obstruction or gangrene Acceptable risk for surgery with Dr. Suzie Frazier  Essential hypertension recommended he take his blood pressure medications prior to surgery No other medication changes made  Preop cardiovascular exam Given his exercise tolerance, minimal EKG abnormality, low risk surgery, he would be acceptable risk for upcoming laparoscopic ventral hernia repair No further testing needed at this time  Disposition:   F/U  As needed  Patient was seen in consultation for Dr. Suzie Frazier and Dr. Clemmie Frazier and will be referred back to his office for ongoing care of issues detailed above   Total encounter time more than 60 minutes  Greater  than 50% was spent in counseling and coordination of care with the patient    Orders Placed This Encounter  Procedures  . EKG 12-Lead     Signed, Shane Frazier, M.D., Ph.D. 10/25/2017  [place], [place]

## 2017-10-27 ENCOUNTER — Emergency Department: Payer: BLUE CROSS/BLUE SHIELD | Admitting: Anesthesiology

## 2017-10-27 ENCOUNTER — Other Ambulatory Visit: Payer: Self-pay

## 2017-10-27 ENCOUNTER — Telehealth: Payer: Self-pay

## 2017-10-27 ENCOUNTER — Encounter
Admission: EM | Disposition: A | Discharge status: Home / Self Care | Payer: Self-pay | Source: Home / Self Care | Attending: Emergency Medicine

## 2017-10-27 ENCOUNTER — Encounter: Payer: Self-pay | Admitting: Emergency Medicine

## 2017-10-27 ENCOUNTER — Observation Stay
Admission: EM | Admit: 2017-10-27 | Discharge: 2017-10-28 | Disposition: A | Discharge status: Home / Self Care | Payer: BLUE CROSS/BLUE SHIELD | Attending: Surgery | Admitting: Surgery

## 2017-10-27 ENCOUNTER — Telehealth: Payer: Self-pay | Admitting: Surgery

## 2017-10-27 DIAGNOSIS — Z808 Family history of malignant neoplasm of other organs or systems: Secondary | ICD-10-CM | POA: Insufficient documentation

## 2017-10-27 DIAGNOSIS — Z8249 Family history of ischemic heart disease and other diseases of the circulatory system: Secondary | ICD-10-CM | POA: Diagnosis not present

## 2017-10-27 DIAGNOSIS — Z7984 Long term (current) use of oral hypoglycemic drugs: Secondary | ICD-10-CM | POA: Insufficient documentation

## 2017-10-27 DIAGNOSIS — I10 Essential (primary) hypertension: Secondary | ICD-10-CM | POA: Diagnosis not present

## 2017-10-27 DIAGNOSIS — K559 Vascular disorder of intestine, unspecified: Secondary | ICD-10-CM | POA: Insufficient documentation

## 2017-10-27 DIAGNOSIS — K436 Other and unspecified ventral hernia with obstruction, without gangrene: Principal | ICD-10-CM | POA: Diagnosis present

## 2017-10-27 DIAGNOSIS — Z9889 Other specified postprocedural states: Secondary | ICD-10-CM | POA: Diagnosis not present

## 2017-10-27 DIAGNOSIS — E039 Hypothyroidism, unspecified: Secondary | ICD-10-CM | POA: Insufficient documentation

## 2017-10-27 DIAGNOSIS — Z91018 Allergy to other foods: Secondary | ICD-10-CM | POA: Insufficient documentation

## 2017-10-27 DIAGNOSIS — Z8669 Personal history of other diseases of the nervous system and sense organs: Secondary | ICD-10-CM | POA: Diagnosis not present

## 2017-10-27 DIAGNOSIS — E119 Type 2 diabetes mellitus without complications: Secondary | ICD-10-CM | POA: Insufficient documentation

## 2017-10-27 DIAGNOSIS — E7800 Pure hypercholesterolemia, unspecified: Secondary | ICD-10-CM | POA: Diagnosis not present

## 2017-10-27 DIAGNOSIS — Z79899 Other long term (current) drug therapy: Secondary | ICD-10-CM | POA: Insufficient documentation

## 2017-10-27 HISTORY — PX: VENTRAL HERNIA REPAIR: SHX424

## 2017-10-27 LAB — COMPREHENSIVE METABOLIC PANEL
ALT: 17 U/L (ref 0–44)
AST: 20 U/L (ref 15–41)
Albumin: 4.1 g/dL (ref 3.5–5.0)
Alkaline Phosphatase: 48 U/L (ref 38–126)
Anion gap: 10 (ref 5–15)
BUN: 13 mg/dL (ref 6–20)
CALCIUM: 9.5 mg/dL (ref 8.9–10.3)
CHLORIDE: 96 mmol/L — AB (ref 98–111)
CO2: 33 mmol/L — ABNORMAL HIGH (ref 22–32)
Creatinine, Ser: 1.2 mg/dL (ref 0.61–1.24)
Glucose, Bld: 196 mg/dL — ABNORMAL HIGH (ref 70–99)
Potassium: 3.1 mmol/L — ABNORMAL LOW (ref 3.5–5.1)
Sodium: 139 mmol/L (ref 135–145)
Total Bilirubin: 1.1 mg/dL (ref 0.3–1.2)
Total Protein: 7.9 g/dL (ref 6.5–8.1)

## 2017-10-27 LAB — CBC
HCT: 50.4 % (ref 40.0–52.0)
Hemoglobin: 17.3 g/dL (ref 13.0–18.0)
MCH: 29.7 pg (ref 26.0–34.0)
MCHC: 34.2 g/dL (ref 32.0–36.0)
MCV: 86.7 fL (ref 80.0–100.0)
PLATELETS: 242 10*3/uL (ref 150–440)
RBC: 5.82 MIL/uL (ref 4.40–5.90)
RDW: 13.6 % (ref 11.5–14.5)
WBC: 9.5 10*3/uL (ref 3.8–10.6)

## 2017-10-27 LAB — LIPASE, BLOOD: LIPASE: 26 U/L (ref 11–51)

## 2017-10-27 LAB — GLUCOSE, CAPILLARY
Glucose-Capillary: 183 mg/dL — ABNORMAL HIGH (ref 70–99)
Glucose-Capillary: 254 mg/dL — ABNORMAL HIGH (ref 70–99)

## 2017-10-27 SURGERY — REPAIR, HERNIA, VENTRAL
Anesthesia: General

## 2017-10-27 MED ORDER — PROPOFOL 10 MG/ML IV BOLUS
INTRAVENOUS | Status: AC
  Filled 2017-10-27: qty 20

## 2017-10-27 MED ORDER — PANTOPRAZOLE SODIUM 40 MG PO TBEC
40.0000 mg | DELAYED_RELEASE_TABLET | Freq: Every day | ORAL | Status: DC
  Administered 2017-10-27 – 2017-10-28 (×2): 40 mg via ORAL
  Filled 2017-10-27 (×2): qty 1

## 2017-10-27 MED ORDER — LABETALOL HCL 5 MG/ML IV SOLN
5.0000 mg | Freq: Once | INTRAVENOUS | Status: AC
  Administered 2017-10-27: 5 mg via INTRAVENOUS

## 2017-10-27 MED ORDER — ONDANSETRON HCL 4 MG/2ML IJ SOLN
4.0000 mg | Freq: Once | INTRAMUSCULAR | Status: AC
  Administered 2017-10-27: 4 mg via INTRAVENOUS

## 2017-10-27 MED ORDER — ACETAMINOPHEN 500 MG PO TABS
1000.0000 mg | ORAL_TABLET | Freq: Four times a day (QID) | ORAL | Status: DC
  Filled 2017-10-27: qty 2

## 2017-10-27 MED ORDER — ACETAMINOPHEN 10 MG/ML IV SOLN
INTRAVENOUS | Status: DC | PRN
  Administered 2017-10-27: 1000 mg via INTRAVENOUS

## 2017-10-27 MED ORDER — CEFAZOLIN SODIUM 1 G IJ SOLR
INTRAMUSCULAR | Status: AC
Start: 2017-10-27 — End: ?
  Filled 2017-10-27: qty 20

## 2017-10-27 MED ORDER — LACTATED RINGERS IV SOLN
INTRAVENOUS | Status: DC | PRN
  Administered 2017-10-27: 17:00:00 via INTRAVENOUS

## 2017-10-27 MED ORDER — FENTANYL CITRATE (PF) 100 MCG/2ML IJ SOLN
INTRAMUSCULAR | Status: DC | PRN
  Administered 2017-10-27 (×2): 50 ug via INTRAVENOUS

## 2017-10-27 MED ORDER — LABETALOL HCL 5 MG/ML IV SOLN
INTRAVENOUS | Status: AC
  Administered 2017-10-27: 5 mg via INTRAVENOUS
  Filled 2017-10-27: qty 4

## 2017-10-27 MED ORDER — ROCURONIUM BROMIDE 100 MG/10ML IV SOLN
INTRAVENOUS | Status: DC | PRN
  Administered 2017-10-27: 30 mg via INTRAVENOUS

## 2017-10-27 MED ORDER — METRONIDAZOLE IN NACL 5-0.79 MG/ML-% IV SOLN
INTRAVENOUS | Status: DC | PRN
  Administered 2017-10-27: 500 mg via INTRAVENOUS

## 2017-10-27 MED ORDER — ENOXAPARIN SODIUM 40 MG/0.4ML ~~LOC~~ SOLN: 40.0000 mg | SUBCUTANEOUS | Status: DC

## 2017-10-27 MED ORDER — SUGAMMADEX SODIUM 200 MG/2ML IV SOLN
INTRAVENOUS | Status: AC
  Filled 2017-10-27: qty 2

## 2017-10-27 MED ORDER — CEFOTETAN DISODIUM 2 G IJ SOLR
2.0000 g | Freq: Three times a day (TID) | INTRAMUSCULAR | Status: AC
  Administered 2017-10-27 – 2017-10-28 (×2): 2 g via INTRAVENOUS
  Filled 2017-10-27 (×2): qty 2

## 2017-10-27 MED ORDER — LACTATED RINGERS IV SOLN
INTRAVENOUS | Status: DC
  Administered 2017-10-27 – 2017-10-28 (×2): via INTRAVENOUS

## 2017-10-27 MED ORDER — LIDOCAINE HCL (CARDIAC) PF 100 MG/5ML IV SOSY
PREFILLED_SYRINGE | INTRAVENOUS | Status: DC | PRN
  Administered 2017-10-27: 50 mg via INTRAVENOUS

## 2017-10-27 MED ORDER — HYDRALAZINE HCL 20 MG/ML IJ SOLN: 10.0000 mg | INTRAMUSCULAR | Status: DC | PRN

## 2017-10-27 MED ORDER — KETOROLAC TROMETHAMINE 30 MG/ML IJ SOLN
30.0000 mg | Freq: Four times a day (QID) | INTRAMUSCULAR | Status: DC
  Administered 2017-10-27 – 2017-10-28 (×2): 30 mg via INTRAVENOUS
  Filled 2017-10-27 (×2): qty 1

## 2017-10-27 MED ORDER — DEXAMETHASONE SODIUM PHOSPHATE 10 MG/ML IJ SOLN
INTRAMUSCULAR | Status: DC | PRN
  Administered 2017-10-27: 5 mg via INTRAVENOUS

## 2017-10-27 MED ORDER — ONDANSETRON HCL 4 MG/2ML IJ SOLN
INTRAMUSCULAR | Status: AC
  Filled 2017-10-27: qty 2

## 2017-10-27 MED ORDER — SUGAMMADEX SODIUM 200 MG/2ML IV SOLN
INTRAVENOUS | Status: DC | PRN
  Administered 2017-10-27: 200 mg via INTRAVENOUS

## 2017-10-27 MED ORDER — CEFAZOLIN (ANCEF) 1 G IV SOLR: 2.0000 g | INTRAVENOUS | Status: DC

## 2017-10-27 MED ORDER — SUCCINYLCHOLINE CHLORIDE 20 MG/ML IJ SOLN
INTRAMUSCULAR | Status: DC | PRN
  Administered 2017-10-27: 120 mg via INTRAVENOUS

## 2017-10-27 MED ORDER — DILTIAZEM HCL ER COATED BEADS 240 MG PO CP24
480.0000 mg | ORAL_CAPSULE | Freq: Once | ORAL | Status: AC
  Administered 2017-10-27: 480 mg via ORAL
  Filled 2017-10-27 (×2): qty 2

## 2017-10-27 MED ORDER — CEFAZOLIN SODIUM-DEXTROSE 2-3 GM-%(50ML) IV SOLR
INTRAVENOUS | Status: DC | PRN
  Administered 2017-10-27: 2 g via INTRAVENOUS

## 2017-10-27 MED ORDER — LIDOCAINE HCL (PF) 2 % IJ SOLN
INTRAMUSCULAR | Status: AC
Start: 2017-10-27 — End: ?
  Filled 2017-10-27: qty 10

## 2017-10-27 MED ORDER — FENTANYL CITRATE (PF) 100 MCG/2ML IJ SOLN
25.0000 ug | INTRAMUSCULAR | Status: DC | PRN
Start: 2017-10-27 — End: 2017-10-27

## 2017-10-27 MED ORDER — DEXAMETHASONE SODIUM PHOSPHATE 10 MG/ML IJ SOLN
INTRAMUSCULAR | Status: AC
  Filled 2017-10-27: qty 1

## 2017-10-27 MED ORDER — MORPHINE SULFATE (PF) 4 MG/ML IV SOLN
INTRAVENOUS | Status: AC
  Filled 2017-10-27: qty 1

## 2017-10-27 MED ORDER — PROCHLORPERAZINE EDISYLATE 10 MG/2ML IJ SOLN
5.0000 mg | Freq: Four times a day (QID) | INTRAMUSCULAR | Status: DC | PRN
  Filled 2017-10-27: qty 2

## 2017-10-27 MED ORDER — OXYCODONE HCL 5 MG PO TABS: 5.0000 mg | ORAL_TABLET | ORAL | Status: DC | PRN

## 2017-10-27 MED ORDER — INSULIN ASPART 100 UNIT/ML ~~LOC~~ SOLN: 4.0000 [IU] | Freq: Three times a day (TID) | SUBCUTANEOUS | Status: DC

## 2017-10-27 MED ORDER — ACETAMINOPHEN 10 MG/ML IV SOLN
INTRAVENOUS | Status: AC
  Filled 2017-10-27: qty 100

## 2017-10-27 MED ORDER — ONDANSETRON HCL 4 MG/2ML IJ SOLN
INTRAMUSCULAR | Status: DC | PRN
  Administered 2017-10-27: 4 mg via INTRAVENOUS

## 2017-10-27 MED ORDER — HYDROMORPHONE HCL 1 MG/ML IJ SOLN: 0.5000 mg | INTRAMUSCULAR | Status: DC | PRN

## 2017-10-27 MED ORDER — INSULIN ASPART 100 UNIT/ML ~~LOC~~ SOLN
0.0000 [IU] | Freq: Every day | SUBCUTANEOUS | Status: DC
  Administered 2017-10-27: 3 [IU] via SUBCUTANEOUS
  Filled 2017-10-27: qty 1

## 2017-10-27 MED ORDER — PROMETHAZINE HCL 25 MG/ML IJ SOLN: 6.2500 mg | INTRAMUSCULAR | Status: DC | PRN

## 2017-10-27 MED ORDER — MIDAZOLAM HCL 2 MG/2ML IJ SOLN
INTRAMUSCULAR | Status: DC | PRN
  Administered 2017-10-27: 2 mg via INTRAVENOUS

## 2017-10-27 MED ORDER — INSULIN ASPART 100 UNIT/ML ~~LOC~~ SOLN
0.0000 [IU] | Freq: Three times a day (TID) | SUBCUTANEOUS | Status: DC
  Administered 2017-10-28: 4 [IU] via SUBCUTANEOUS
  Filled 2017-10-27: qty 1

## 2017-10-27 MED ORDER — FENTANYL CITRATE (PF) 100 MCG/2ML IJ SOLN
INTRAMUSCULAR | Status: AC
  Filled 2017-10-27: qty 2

## 2017-10-27 MED ORDER — BUPIVACAINE LIPOSOME 1.3 % IJ SUSP
INTRAMUSCULAR | Status: DC | PRN
  Administered 2017-10-27: 20 mL

## 2017-10-27 MED ORDER — MIDAZOLAM HCL 2 MG/2ML IJ SOLN
INTRAMUSCULAR | Status: AC
  Filled 2017-10-27: qty 2

## 2017-10-27 MED ORDER — BUPIVACAINE-EPINEPHRINE (PF) 0.25% -1:200000 IJ SOLN
INTRAMUSCULAR | Status: DC | PRN
  Administered 2017-10-27: 30 mL via PERINEURAL

## 2017-10-27 MED ORDER — MORPHINE SULFATE (PF) 4 MG/ML IV SOLN
4.0000 mg | Freq: Once | INTRAVENOUS | Status: AC
  Administered 2017-10-27: 4 mg via INTRAVENOUS

## 2017-10-27 MED ORDER — ONDANSETRON HCL 4 MG/2ML IJ SOLN
4.0000 mg | Freq: Four times a day (QID) | INTRAMUSCULAR | Status: DC | PRN
Start: 2017-10-27 — End: 2017-10-28

## 2017-10-27 MED ORDER — PROCHLORPERAZINE MALEATE 10 MG PO TABS
10.0000 mg | ORAL_TABLET | Freq: Four times a day (QID) | ORAL | Status: DC | PRN
  Filled 2017-10-27: qty 1

## 2017-10-27 MED ORDER — METRONIDAZOLE IN NACL 5-0.79 MG/ML-% IV SOLN
500.0000 mg | Freq: Once | INTRAVENOUS | Status: DC
  Filled 2017-10-27: qty 100

## 2017-10-27 MED ORDER — ONDANSETRON 4 MG PO TBDP: 4.0000 mg | ORAL_TABLET | Freq: Four times a day (QID) | ORAL | Status: DC | PRN

## 2017-10-27 SURGICAL SUPPLY — 32 items
APPLIER CLIP 11 MED OPEN (CLIP) ×2
APPLIER CLIP 13 LRG OPEN (CLIP) ×2
BLADE CLIPPER SURG (BLADE) ×2 IMPLANT
BULB RESERV EVAC DRAIN JP 100C (MISCELLANEOUS) ×2 IMPLANT
CANISTER SUCT 3000ML PPV (MISCELLANEOUS) ×2 IMPLANT
CHLORAPREP W/TINT 26ML (MISCELLANEOUS) ×2 IMPLANT
CLIP APPLIE 11 MED OPEN (CLIP) ×1 IMPLANT
CLIP APPLIE 13 LRG OPEN (CLIP) ×1 IMPLANT
DRAIN CHANNEL JP 15F RND 16 (MISCELLANEOUS) ×2 IMPLANT
DRAPE LAPAROTOMY 100X77 ABD (DRAPES) ×2 IMPLANT
DRSG OPSITE POSTOP 3X4 (GAUZE/BANDAGES/DRESSINGS) ×2 IMPLANT
DRSG TEGADERM 4X4.75 (GAUZE/BANDAGES/DRESSINGS) ×2 IMPLANT
DRSG TELFA 3X8 NADH (GAUZE/BANDAGES/DRESSINGS) ×2 IMPLANT
ELECT REM PT RETURN 9FT ADLT (ELECTROSURGICAL) ×2
ELECTRODE REM PT RTRN 9FT ADLT (ELECTROSURGICAL) ×1 IMPLANT
GAUZE SPONGE 4X4 12PLY STRL (GAUZE/BANDAGES/DRESSINGS) ×2 IMPLANT
GLOVE BIO SURGEON STRL SZ7 (GLOVE) ×2 IMPLANT
GOWN STRL REUS W/ TWL LRG LVL3 (GOWN DISPOSABLE) ×2 IMPLANT
GOWN STRL REUS W/TWL LRG LVL3 (GOWN DISPOSABLE) ×2
NEEDLE HYPO 22GX1.5 SAFETY (NEEDLE) ×2 IMPLANT
NEEDLE HYPO 25X1 1.5 SAFETY (NEEDLE) ×2 IMPLANT
PACK BASIN MINOR ARMC (MISCELLANEOUS) ×2 IMPLANT
SPONGE LAP 18X18 RF (DISPOSABLE) ×2 IMPLANT
STAPLER SKIN PROX 35W (STAPLE) ×2 IMPLANT
SUT ETHIBOND 0 MO6 C/R (SUTURE) ×2 IMPLANT
SUT ETHIBOND NAB MO 7 #0 18IN (SUTURE) ×2 IMPLANT
SUT SILK 2 0SH CR/8 30 (SUTURE) ×2 IMPLANT
SUT VIC AB 2-0 SH 27 (SUTURE) ×3
SUT VIC AB 2-0 SH 27XBRD (SUTURE) ×3 IMPLANT
SYR 20CC LL (SYRINGE) ×2 IMPLANT
TAPE MICROFOAM 4IN (TAPE) ×2 IMPLANT
WATER STERILE IRR 1000ML POUR (IV SOLUTION) ×2 IMPLANT

## 2017-10-27 NOTE — ED Notes (Signed)
Prior to giving pain medication pt was laying on his back and was asleep - he had to be aroused for IV stick and pain medication to be given and then requested to be covered up so he could go back to sleep

## 2017-10-27 NOTE — H&P (Signed)
Patient ID: Shane Frazier., male   DOB: 19-Jan-1966, 52 y.o.   MRN: [id number]  HPI Shane Frazier. is a 52 y.o. male to see patient in consultation by the ER for incarcerated ventral hernia.  Of note the patient had a previous umbilical hernia repair in the past and was seen in our office for an elective repair.  Now comes patient with a 24-hour history of severe abdominal pain and unable to release his hernia.  The pain is constant, moderate to severe intensity and sharp in nature.  Worsening with Valsalva.  The ER physician tried to reduce it at the bedside with eyes with Trendelenburg without success.  Patient continues to have severe pain.  Fevers no chills no emesis.  No weight loss or any other constitutional symptoms.  He is able to perform more than 4 METS of activity without any shortness of breath or chest pain.  She reviews and completely normal other than hemoconcentrated.  Creatinine slightly elevated to 1.2 and potassium is 3.1.  HPI  Past Medical History:  Diagnosis Date  . Concussion 2017   cause of seizure after hit head at work  . Diabetes mellitus without complication ([place])   . Difficult intubation    patient unaware of this diagnosis  . High cholesterol   . Hypertension   . Hypothyroidism   . Seizures ([place]) 05-20-15   X 1-PT HIT HIS HEAD AT WORK AND THEN HAD A SEIZURE  . Thyroid disease     Past Surgical History:  Procedure Laterality Date  . HERNIA REPAIR     as a child,umbilical  . SHOULDER ARTHROSCOPY WITH ROTATOR CUFF REPAIR AND SUBACROMIAL DECOMPRESSION Left 09/09/2015   Procedure: SHOULDER ARTHROSCOPY, SUBACROMIAL DECOMPRESSION,BICEPS TENSON RELEASE AND MINI OPEN ROTATOR CUFF REPAIR;  Surgeon: Leanor Kail, MD;  Location: [facility] ORS;  Service: Orthopedics;  Laterality: Left;    Family History  Problem Relation Age of Onset  . Hypertension Mother   . Cancer Father   . Diabetes Sister   . Thyroid cancer Sister     Social History Social History   Tobacco Use   . Smoking status: Never Smoker  . Smokeless tobacco: Current User    Types: Chew  Substance Use Topics  . Alcohol use: Yes    Comment: occasionally. rarely  . Drug use: No    Allergies  Allergen Reactions  . Other Other (See Comments)    BRAZILIAN NUTS ONLY-"THROAT CLOSES UP"    No current facility-administered medications for this encounter.    Current Outpatient Medications  Medication Sig Dispense Refill  . atorvastatin (LIPITOR) 20 MG tablet Take 20 mg by mouth every morning.     . diltiazem (MATZIM LA) 420 MG 24 hr tablet Take 420 mg by mouth every morning.    Marland Kitchen glimepiride (AMARYL) 4 MG tablet Take 4 mg by mouth daily with breakfast.    . hydrochlorothiazide (HYDRODIURIL) 25 MG tablet Take 25 mg by mouth daily.    Marland Kitchen levothyroxine (SYNTHROID, LEVOTHROID) 200 MCG tablet Take 200 mcg by mouth daily before breakfast.     . lisinopril (PRINIVIL,ZESTRIL) 20 MG tablet Take 20 mg by mouth every morning.     . metFORMIN (GLUCOPHAGE) 1000 MG tablet Take 1,000 mg by mouth 2 (two) times daily with a meal.       Review of Systems Full ROS  was asked and was negative except for the information on the HPI  Physical Exam Blood pressure (!) 161/95, pulse Marland Kitchen)  106, temperature 98.6 F (37 C), temperature source Oral, resp. rate 16, height 5' 7.5" (1.715 m), weight 103.4 kg (228 lb), SpO2 97 %. CONSTITUTIONAL: PT in pain lying flat and still EYES: Pupils are equal, round, and reactive to light, Sclera are non-icteric. EARS, NOSE, MOUTH AND THROAT: The oropharynx is clear. The oral mucosa is pink and moist. Hearing is intact to voice. LYMPH NODES:  Lymph nodes in the neck are normal. RESPIRATORY:  Lungs are clear. There is normal respiratory effort, with equal breath sounds bilaterally, and without pathologic use of accessory muscles. CARDIOVASCULAR: Heart is regular without murmurs, gallops, or rubs. GI: Large midline ventral hernia unable to be reduced and exquisitely tender to  palpation with focal peritonitis. GU: Rectal deferred.   MUSCULOSKELETAL: Normal muscle strength and tone. No cyanosis or edema.   SKIN: Turgor is good and there are no pathologic skin lesions or ulcers. NEUROLOGIC: Motor and sensation is grossly normal. Cranial nerves are grossly intact. PSYCH:  Oriented to person, place and time. Affect is normal.  Data Reviewed  I have personally reviewed the patient's imaging, laboratory findings and medical records.    Assessment/Plan Incarcerated ventral hernia.  Discussed with patient in detail I do recommend emergent repair.  He is exquisitely tender to palpation I am afraid that might be a component of strangulation.  Discussed with the patient detail about his disease process.  Risk benefit and possible complication of surgery including but not limited to: Bleeding, infection, recurrence, re-intervention, injury to the adjacent structures.  He understands and wishes to proceed.  We will go ahead and posted for an hernia repair of ventral hernia.  Discussed the case in detail with Dr. Alfred Levins and the OR   Caroleen Hamman, MD FACS General Surgeon 10/27/2017, 4:15 PM

## 2017-10-27 NOTE — ED Notes (Signed)
Dr Alfred Levins notified that pt had been given pain medication - she is going to attempt to reduce hernia

## 2017-10-27 NOTE — Op Note (Signed)
Open Ventral Hernia Repair  Pre-operative Diagnosis: Strangulated ventral hernia  Post-operative Diagnosis: Same  Surgeon: Caroleen Hamman, MD FACS  Anesthesia: Gen. with endotracheal tube   Findings:  Strangulated piece of SB within hernia, After reduction , bowel had significant improvement and good perfusion Omentum resected due to some ischemia  Estimated Blood Loss: 10cc         Drains: 15 blake         Specimens: Omentum         Complications: none              Procedure Details  The patient was seen again in the Holding Room. The benefits, complications, treatment options, and expected outcomes were discussed with the patient. The risks of bleeding, infection, recurrence of symptoms, failure to resolve symptoms, bowel injury, mesh placement, mesh infection, any of which could require further surgery were reviewed with the patient. The likelihood of improving the patient's symptoms with return to their baseline status is good.  The patient and/or family concurred with the proposed plan, giving informed consent.  The patient was taken to Operating Room, identified as Shane Frazier. and the procedure verified.  A Time Out was held and the above information confirmed.  Prior to the induction of general anesthesia, antibiotic prophylaxis was administered. VTE prophylaxis was in place. General endotracheal anesthesia was then administered and tolerated well. After the induction, the abdomen was prepped with Chloraprep and draped in the sterile fashion. The patient was positioned in the supine position.   Incision was created with a scalpel over the hernia defect. Electrocautery was used to dissect through subcutaneous tissue, the hernia sac was opened and we encounter a segment of SB that was ischemic, the defect was enlarge with cautery and the SB was reduced. We waited a few minutes with dramatic improvement on the appearance of the bowel. THe omentum was resected after multiple kellies  were placed and ligated with 2-0 silks. There was adequate hemostasis. 15 blake drain was placed due to a large subq space. 2-0 vicryl used to close the subq and skin was closed with staples Liposomal marcaine and Marcaine quarter percent with epinephrine and lidocaine 1% was used to inject all the incision sites. Patient tolerated procedure well and there were no immediate complications. Needle and laparotomy counts were correct   Caroleen Hamman, MD, FACS

## 2017-10-27 NOTE — Telephone Encounter (Signed)
Patients calling said he's been hurting, patient said he's been throwing up, pain level being at a six. Patient is suppose to have surgery on 11/02/17 wth Dr. Hampton Abbot. Please call patient and advise.

## 2017-10-27 NOTE — Anesthesia Preprocedure Evaluation (Signed)
Anesthesia Evaluation  Patient identified by MRN, date of birth, ID band Patient awake    Reviewed: Allergy & Precautions, H&P , NPO status , Patient's Chart, lab work & pertinent test results  History of Anesthesia Complications Negative for: history of anesthetic complications  Airway Mallampati: II  TM Distance: >3 FB Neck ROM: full    Dental  (+) Poor Dentition, Chipped, Missing   Pulmonary neg pulmonary ROS, neg shortness of breath,           Cardiovascular Exercise Tolerance: Good hypertension, (-) angina(-) Past MI, (-) Cardiac Stents and (-) DOE (-) dysrhythmias (-) Valvular Problems/Murmurs     Neuro/Psych Seizures -, Well Controlled,  negative psych ROS   GI/Hepatic negative GI ROS, Neg liver ROS,   Endo/Other  diabetes, Type 2Hypothyroidism   Renal/GU negative Renal ROS  negative genitourinary   Musculoskeletal   Abdominal   Peds  Hematology negative hematology ROS (+)   Anesthesia Other Findings Past Medical History:   Hypertension                                                 Diabetes mellitus without complication (HCC)                 Thyroid disease                                              High cholesterol                                             Hypothyroidism                                               Seizures ([place])                                  05-20-15         Comment:X 1-PT HIT HIS HEAD AT WORK AND THEN HAD A               SEIZURE  Past Surgical History:   HERNIA REPAIR                                                BMI    Body Mass Index   39.92 kg/m 2    Signs and symptoms suggestive of sleep apnea   Patient endorses weakness in operative arm and shoulder at baseline   Reproductive/Obstetrics negative OB ROS                             Anesthesia Physical  Anesthesia Plan  ASA: III and emergent  Anesthesia Plan: General ETT   Post-op  Pain Management:  Regional for Post-op pain  Induction: Intravenous, Rapid sequence and Cricoid pressure planned  PONV Risk Score and Plan: 2 and Ondansetron and Dexamethasone  Airway Management Planned: Oral ETT  Additional Equipment:   Intra-op Plan:   Post-operative Plan: Extubation in OR  Informed Consent: I have reviewed the patients History and Physical, chart, labs and discussed the procedure including the risks, benefits and alternatives for the proposed anesthesia with the patient or authorized representative who has indicated his/her understanding and acceptance.   Dental Advisory Given  Plan Discussed with: Anesthesiologist, CRNA and Surgeon  Anesthesia Plan Comments:         Anesthesia Quick Evaluation

## 2017-10-27 NOTE — Anesthesia Post-op Follow-up Note (Signed)
Anesthesia QCDR form completed.        

## 2017-10-27 NOTE — ED Triage Notes (Signed)
Abdominal pain and vomiting x 1 day.  Patient has an abdominal hernia and has surgery scheduled for 7/18.

## 2017-10-27 NOTE — Telephone Encounter (Signed)
Patient states he is vomiting and cannot have a bowel movement and his abdomen is hard and he is having increase discomfort / pian.  Patient was instructed to go to Emergency room as the office is closed.

## 2017-10-27 NOTE — ED Provider Notes (Signed)
[facility] Emergency Department Provider Note  ____________________________________________  Time seen: Approximately 4:07 PM  I have reviewed the triage vital signs and the nursing notes.   HISTORY  Chief Complaint No chief complaint on file.   HPI Kaedon Fanelli. is a 52 y.o. male with history as listed below who presents for evaluation of abdominal pain.  Patient has had a ventral hernia for 10 years.  Since yesterday he has been unable to reduce it and is complaining of severe constant sharp pain.  Also has had one episode of nonbloody nonbilious emesis today.  No fever or chills.  He reports that he is not passing flatus since this morning.  Past Medical History:  Diagnosis Date  . Concussion 2017   cause of seizure after hit head at work  . Diabetes mellitus without complication ([place])   . Difficult intubation    patient unaware of this diagnosis  . High cholesterol   . Hypertension   . Hypothyroidism   . Seizures ([place]) 05-20-15   X 1-PT HIT HIS HEAD AT WORK AND THEN HAD A SEIZURE  . Thyroid disease     Patient Active Problem List   Diagnosis Date Noted  . Abnormal EKG 10/25/2017  . Type 2 diabetes mellitus without complication, without long-term current use of insulin ([place]) 10/25/2017  . Ventral hernia without obstruction or gangrene 10/25/2017  . Preop cardiovascular exam 10/25/2017  . Essential hypertension 10/04/2017  . H/O traumatic brain injury 12/22/2016  . S/P rotator cuff repair 10/13/2015  . Injury of left rotator cuff 09/22/2015  . Convulsions ([place]) 05/22/2015  . Abnormal MRI of head 05/13/2015  . Confusion 05/13/2015  . Migraine headache without aura 05/13/2015    Past Surgical History:  Procedure Laterality Date  . HERNIA REPAIR     as a child,umbilical  . SHOULDER ARTHROSCOPY WITH ROTATOR CUFF REPAIR AND SUBACROMIAL DECOMPRESSION Left 09/09/2015   Procedure: SHOULDER ARTHROSCOPY, SUBACROMIAL DECOMPRESSION,BICEPS TENSON  RELEASE AND MINI OPEN ROTATOR CUFF REPAIR;  Surgeon: Leanor Kail, MD;  Location: [facility] ORS;  Service: Orthopedics;  Laterality: Left;    Prior to Admission medications   Medication Sig Start Date End Date Taking? Authorizing Provider  atorvastatin (LIPITOR) 20 MG tablet Take 20 mg by mouth every morning.     [provider]  diltiazem (MATZIM LA) 420 MG 24 hr tablet Take 420 mg by mouth every morning.    [provider]  glimepiride (AMARYL) 4 MG tablet Take 4 mg by mouth daily with breakfast.    [provider]  hydrochlorothiazide (HYDRODIURIL) 25 MG tablet Take 25 mg by mouth daily.    [provider]  levothyroxine (SYNTHROID, LEVOTHROID) 200 MCG tablet Take 200 mcg by mouth daily before breakfast.     [provider]  lisinopril (PRINIVIL,ZESTRIL) 20 MG tablet Take 20 mg by mouth every morning.     [provider]  metFORMIN (GLUCOPHAGE) 1000 MG tablet Take 1,000 mg by mouth 2 (two) times daily with a meal.    [provider]    Allergies Other  Family History  Problem Relation Age of Onset  . Hypertension Mother   . Cancer Father   . Diabetes Sister   . Thyroid cancer Sister     Social History Social History   Tobacco Use  . Smoking status: Never Smoker  . Smokeless tobacco: Current User    Types: Chew  Substance Use Topics  . Alcohol use: Yes  Comment: occasionally. rarely  . Drug use: No    Review of Systems  Constitutional: Negative for fever. Eyes: Negative for visual changes. ENT: Negative for sore throat. Neck: No neck pain  Cardiovascular: Negative for chest pain. Respiratory: Negative for shortness of breath. Gastrointestinal: + hernia and pain, nausea, and vomiting. No diarrhea. Genitourinary: Negative for dysuria. Musculoskeletal: Negative for back pain. Skin: Negative for rash. Neurological: Negative for headaches, weakness or numbness. Psych: No SI or  HI  ____________________________________________   PHYSICAL EXAM:  VITAL SIGNS: ED Triage Vitals  Enc Vitals Group     BP 10/27/17 1350 (!) 161/95     Pulse Rate 10/27/17 1350 (!) 106     Resp 10/27/17 1350 16     Temp 10/27/17 1350 98.6 F (37 C)     Temp Source 10/27/17 1350 Oral     SpO2 10/27/17 1350 97 %     Weight 10/27/17 1349 228 lb (103.4 kg)     Height 10/27/17 1349 5' 7.5" (1.715 m)     Head Circumference --      Peak Flow --      Pain Score 10/27/17 1348 5     Pain Loc --      Pain Edu? --      Excl. in [place]? --     Constitutional: Alert and oriented. Well appearing and in no apparent distress. HEENT:      Head: Normocephalic and atraumatic.         Eyes: Conjunctivae are normal. Sclera is non-icteric.       Mouth/Throat: Mucous membranes are moist.       Neck: Supple with no signs of meningismus. Cardiovascular: Tachycardic with rhythm. No murmurs, gallops, or rubs. 2+ symmetrical distal pulses are present in all extremities. No JVD. Respiratory: Normal respiratory effort. Lungs are clear to auscultation bilaterally. No wheezes, crackles, or rhonchi.  Gastrointestinal: Soft, large ventral hernia which is tense on exam and with some mild erythema of the overlying skin, and non distended with positive bowel sounds. No rebound or guarding. Genitourinary: No CVA tenderness. Musculoskeletal: Nontender with normal range of motion in all extremities. No edema, cyanosis, or erythema of extremities. Neurologic: Normal speech and language. Face is symmetric. Moving all extremities. No gross focal neurologic deficits are appreciated. Skin: Skin is warm, dry and intact. No rash noted. Psychiatric: Mood and affect are normal. Speech and behavior are normal.  ____________________________________________   LABS (all labs ordered are listed, but only abnormal results are displayed)  Labs Reviewed  COMPREHENSIVE METABOLIC PANEL - Abnormal; Notable for the following  components:      Result Value   Potassium 3.1 (*)    Chloride 96 (*)    CO2 33 (*)    Glucose, Bld 196 (*)    All other components within normal limits  LIPASE, BLOOD  CBC  URINALYSIS, COMPLETE (UACMP) WITH MICROSCOPIC   ____________________________________________  EKG  none  ____________________________________________  RADIOLOGY  none  ____________________________________________   PROCEDURES  Procedure(s) performed: None Procedures Critical Care performed:  Yes  CRITICAL CARE Performed by: Rudene Re  ?  Total critical care time: 30 min  Critical care time was exclusive of separately billable procedures and treating other patients.  Critical care was necessary to treat or prevent imminent or life-threatening deterioration.  Critical care was time spent personally by me on the following activities: development of treatment plan with patient and/or surrogate as well as nursing, discussions with consultants, evaluation of patient's response to  treatment, examination of patient, obtaining history from patient or surrogate, ordering and performing treatments and interventions, ordering and review of laboratory studies, ordering and review of radiographic studies, pulse oximetry and re-evaluation of patient's condition.  ____________________________________________   INITIAL IMPRESSION / ASSESSMENT AND PLAN / ED COURSE  52 y.o. male with history as listed below who presents for evaluation of abdominal pain and ventral hernia.  Patient with exam consistent with incarcerated ventral hernia.  An attempt to reduce in the emergency room was unsuccessful.  Dr. Dahlia Byes was consulted and will take patient to the operating room.      As part of my medical decision making, I reviewed the following data within the [place] notes reviewed and incorporated, Labs reviewed , Discussed with admitting physician , A consult was requested and obtained  from this/these consultant(s) Surgery, Notes from prior ED visits and [place] Controlled Substance Database    Pertinent labs & imaging results that were available during my care of the patient were reviewed by me and considered in my medical decision making (see chart for details).    ____________________________________________   FINAL CLINICAL IMPRESSION(S) / ED DIAGNOSES  Final diagnoses:  Incarcerated ventral hernia      NEW MEDICATIONS STARTED DURING THIS VISIT:  ED Discharge Orders    None       Note:  This document was prepared using Dragon voice recognition software and may include unintentional dictation errors.    Alfred Levins, [place], MD 10/27/17 [phone]

## 2017-10-27 NOTE — ED Notes (Signed)
Pt transferred to surgery by orderly  Consent for surgery obtained by Dr Dahlia Byes and this nurse witnessed

## 2017-10-27 NOTE — Anesthesia Procedure Notes (Signed)
Procedure Name: Intubation Date/Time: 10/27/2017 4:42 PM Performed by: Jonna Clark, CRNA Pre-anesthesia Checklist: Patient identified, Patient being monitored, Timeout performed, Emergency Drugs available and Suction available Patient Re-evaluated:Patient Re-evaluated prior to induction Oxygen Delivery Method: Circle system utilized Preoxygenation: Pre-oxygenation with 100% oxygen Induction Type: IV induction and Rapid sequence Ventilation: Mask ventilation without difficulty Laryngoscope Size: 4 and McGraph Grade View: Grade II Tube type: Oral Tube size: 7.5 mm Number of attempts: 1 Airway Equipment and Method: Stylet Placement Confirmation: ETT inserted through vocal cords under direct vision,  positive ETCO2 and breath sounds checked- equal and bilateral Secured at: 21 cm Tube secured with: Tape Dental Injury: Teeth and Oropharynx as per pre-operative assessment

## 2017-10-27 NOTE — Transfer of Care (Signed)
Immediate Anesthesia Transfer of Care Note  Patient: Shane Frazier.  Procedure(s) Performed: HERNIA REPAIR VENTRAL ADULT (N/A )  Patient Location: PACU  Anesthesia Type:General  Level of Consciousness: drowsy and patient cooperative  Airway & Oxygen Therapy: Patient Spontanous Breathing and Patient connected to face mask oxygen  Post-op Assessment: Report given to RN and Post -op Vital signs reviewed and stable  Post vital signs: Reviewed and stable  Last Vitals:  Vitals Value Taken Time  BP 184/95 10/27/2017  5:52 PM  Temp 36.3 C 10/27/2017  5:52 PM  Pulse 85 10/27/2017  5:56 PM  Resp 19 10/27/2017  5:57 PM  SpO2 100 % 10/27/2017  5:56 PM  Vitals shown include unvalidated device data.  Last Pain:  Vitals:   10/27/17 1752  TempSrc:   PainSc: Asleep      Patients Stated Pain Goal: 2 (44/01/02 7253)  Complications: No apparent anesthesia complications

## 2017-10-27 NOTE — ED Notes (Addendum)
Pt reports hernia that has been present for 15 years - he reports that today it started to hurt more and seemed larger - he was also unable to get the area to "go back in"- Dr Alfred Levins at bedside assessing pt

## 2017-10-28 LAB — CBC
HEMATOCRIT: 43.5 % (ref 40.0–52.0)
HEMOGLOBIN: 14.7 g/dL (ref 13.0–18.0)
MCH: 29.6 pg (ref 26.0–34.0)
MCHC: 33.9 g/dL (ref 32.0–36.0)
MCV: 87.4 fL (ref 80.0–100.0)
Platelets: 195 10*3/uL (ref 150–440)
RBC: 4.98 MIL/uL (ref 4.40–5.90)
RDW: 13.3 % (ref 11.5–14.5)
WBC: 12.8 10*3/uL — ABNORMAL HIGH (ref 3.8–10.6)

## 2017-10-28 LAB — BASIC METABOLIC PANEL
ANION GAP: 8 (ref 5–15)
BUN: 14 mg/dL (ref 6–20)
CHLORIDE: 98 mmol/L (ref 98–111)
CO2: 33 mmol/L — ABNORMAL HIGH (ref 22–32)
Calcium: 8.4 mg/dL — ABNORMAL LOW (ref 8.9–10.3)
Creatinine, Ser: 1.26 mg/dL — ABNORMAL HIGH (ref 0.61–1.24)
GFR calc non Af Amer: >60 mL/min (ref >60)
Glucose, Bld: 212 mg/dL — ABNORMAL HIGH (ref 70–99)
POTASSIUM: 3.4 mmol/L — AB (ref 3.5–5.1)
Sodium: 139 mmol/L (ref 135–145)

## 2017-10-28 LAB — GLUCOSE, CAPILLARY: GLUCOSE-CAPILLARY: 164 mg/dL — AB (ref 70–99)

## 2017-10-28 MED ORDER — METFORMIN HCL 500 MG PO TABS: 1000.0000 mg | ORAL_TABLET | Freq: Two times a day (BID) | ORAL | Status: DC

## 2017-10-28 MED ORDER — LISINOPRIL 20 MG PO TABS
20.0000 mg | ORAL_TABLET | Freq: Every day | ORAL | Status: DC
  Administered 2017-10-28: 20 mg via ORAL
  Filled 2017-10-28: qty 1

## 2017-10-28 MED ORDER — SPIRONOLACTONE-HCTZ 25-25 MG PO TABS
1.0000 | ORAL_TABLET | Freq: Every day | ORAL | Status: DC
  Filled 2017-10-28: qty 1

## 2017-10-28 MED ORDER — HYDROCODONE-ACETAMINOPHEN 5-325 MG PO TABS: 1.0000 | ORAL_TABLET | Freq: Four times a day (QID) | ORAL | 0 refills | Status: DC | PRN

## 2017-10-28 NOTE — Discharge Instructions (Signed)
Open Hernia Repair, Adult, Care After °These instructions give you information about caring for yourself after your procedure. Your doctor may also give you more specific instructions. If you have problems or questions, contact your doctor. °Follow these instructions at home: °Surgical cut (incision) care ° °· Follow instructions from your doctor about how to take care of your surgical cut area. Make sure you: °? Wash your hands with soap and water before you change your bandage (dressing). If you cannot use soap and water, use hand sanitizer. °? Change your bandage as told by your doctor. °? Leave stitches (sutures), skin glue, or skin tape (adhesive) strips in place. They may need to stay in place for 2 weeks or longer. If tape strips get loose and curl up, you may trim the loose edges. Do not remove tape strips completely unless your doctor says it is okay. °· Check your surgical cut every day for signs of infection. Check for: °? More redness, swelling, or pain. °? More fluid or blood. °? Warmth. °? Pus or a bad smell. °Activity °· Do not drive or use heavy machinery while taking prescription pain medicine. Do not drive until your doctor says it is okay. °· Until your doctor says it is okay: °? Do not lift anything that is heavier than 10 lb (4.5 kg). °? Do not play contact sports. °· Return to your normal activities as told by your doctor. Ask your doctor what activities are safe. °General instructions °· To prevent or treat having a hard time pooping (constipation) while you are taking prescription pain medicine, your doctor may recommend that you: °? Drink enough fluid to keep your pee (urine) clear or pale yellow. °? Take over-the-counter or prescription medicines. °? Eat foods that are high in fiber, such as fresh fruits and vegetables, whole grains, and beans. °? Limit foods that are high in fat and processed sugars, such as fried and sweet foods. °· Take over-the-counter and prescription medicines only as  told by your doctor. °· Do not take baths, swim, or use a hot tub until your doctor says it is okay. °· Keep all follow-up visits as told by your doctor. This is important. °Contact a doctor if: °· You develop a rash. °· You have more redness, swelling, or pain around your surgical cut. °· You have more fluid or blood coming from your surgical cut. °· Your surgical cut feels warm to the touch. °· You have pus or a bad smell coming from your surgical cut. °· You have a fever or chills. °· You have blood in your poop (stool). °· You have not pooped in 2-3 days. °· Medicine does not help your pain. °Get help right away if: °· You have chest pain or you are short of breath. °· You feel light-headed. °· You feel weak and dizzy (feel faint). °· You have very bad pain. °· You throw up (vomit) and your pain is worse. °This information is not intended to replace advice given to you by your health care provider. Make sure you discuss any questions you have with your health care provider. °Document Released: 04/25/2014 Document Revised: 10/23/2015 Document Reviewed: 09/16/2015 °Elsevier Interactive Patient Education © 2018 [employer] Inc. ° °

## 2017-10-28 NOTE — Progress Notes (Signed)
Chaplain responded to an OR for an AD. Pt was eating breakfast with friends by the bedside. Chaplain educated Pt on AD. Pt said he would discuss with his friend.    10/28/17 1100  Clinical Encounter Type  Visited With Patient and family together  Visit Type Initial  Referral From Nurse  Spiritual Encounters  Spiritual Needs Brochure

## 2017-10-28 NOTE — Discharge Summary (Signed)
Patient ID: Shane Frazier. MRN: [id number] DOB/AGE: 1965-05-08 52 y.o.  Admit date: 10/27/2017 Discharge date: 10/28/2017   Discharge Diagnoses:  Active Problems:   Incarcerated ventral hernia   Strangulated ventral hernia   Procedures: open ventral hernia repair  Hospital Course: Shane Frazier is a 52 year old male admitted with an acute abdomen for a strangulated ventral hernia.  He was taken emergently to the operating room for an open repair and reduction of hernia.  No need for bowel resection.  He was kept overnight.  At the time of discharge he was taking regular diet, pain was under control.  Minimal drainage from JP.  Vital signs were stable.  His physical exam showed a male in no acute distress.  Awake and alert.  Abdomen: Soft incision healing well dressing intact.  JP with some serosanguineous fluid.  No peritonitis no infection.  Extremities: No edema and well-perfused.  Condition of the patient at the  time of discharge was stable   Disposition: Discharge disposition: 01-Home or Self Care       Discharge Instructions    Call MD for:  difficulty breathing, headache or visual disturbances   Complete by:  As directed    Call MD for:  extreme fatigue   Complete by:  As directed    Call MD for:  hives   Complete by:  As directed    Call MD for:  persistant dizziness or light-headedness   Complete by:  As directed    Call MD for:  persistant nausea and vomiting   Complete by:  As directed    Call MD for:  redness, tenderness, or signs of infection (pain, swelling, redness, odor or green/yellow discharge around incision site)   Complete by:  As directed    Call MD for:  severe uncontrolled pain   Complete by:  As directed    Call MD for:  temperature >100.4   Complete by:  As directed    Diet - low sodium heart healthy   Complete by:  As directed    Discharge instructions   Complete by:  As directed    Please teach pt about JP care   Increase activity slowly    Complete by:  As directed    Lifting restrictions   Complete by:  As directed    20 lbs x 6 wks   Remove dressing in 24 hours   Complete by:  As directed      Allergies as of 10/28/2017      Reactions   Other Other (See Comments)   BRAZILIAN NUTS ONLY-"THROAT CLOSES UP"      Medication List    TAKE these medications   atorvastatin 20 MG tablet Commonly known as:  LIPITOR Take 20 mg by mouth every morning.   glimepiride 4 MG tablet Commonly known as:  AMARYL Take 4 mg by mouth daily with breakfast.   hydrochlorothiazide 25 MG tablet Commonly known as:  HYDRODIURIL Take 25 mg by mouth daily.   HYDROcodone-acetaminophen 5-325 MG tablet Commonly known as:  NORCO/VICODIN Take 1-2 tablets by mouth every 6 (six) hours as needed for moderate pain.   levothyroxine 200 MCG tablet Commonly known as:  SYNTHROID, LEVOTHROID Take 200 mcg by mouth daily before breakfast.   lisinopril 20 MG tablet Commonly known as:  PRINIVIL,ZESTRIL Take 20 mg by mouth every morning.   MATZIM LA 420 MG 24 hr tablet Generic drug:  diltiazem Take 420 mg by mouth every morning.   metFORMIN  1000 MG tablet Commonly known as:  GLUCOPHAGE Take 1,000 mg by mouth 2 (two) times daily with a meal.      Follow-up Information    Jules Husbands, MD Follow up on 11/01/2017.   Specialty:  General Surgery Contact information: [place] [address] [phone]            Caroleen Hamman, MD FACS

## 2017-10-28 NOTE — Progress Notes (Signed)
Shane Frazier. to be D/C'd Home per MD order.  Discussed prescriptions and follow up appointments with the patient. Prescriptions given to patient, medication list explained in detail. Pt verbalized understanding.  Allergies as of 10/28/2017      Reactions   Other Other (See Comments)   BRAZILIAN NUTS ONLY-"THROAT CLOSES UP"      Medication List    TAKE these medications   atorvastatin 20 MG tablet Commonly known as:  LIPITOR Take 20 mg by mouth every morning.   glimepiride 4 MG tablet Commonly known as:  AMARYL Take 4 mg by mouth daily with breakfast.   hydrochlorothiazide 25 MG tablet Commonly known as:  HYDRODIURIL Take 25 mg by mouth daily.   HYDROcodone-acetaminophen 5-325 MG tablet Commonly known as:  NORCO/VICODIN Take 1-2 tablets by mouth every 6 (six) hours as needed for moderate pain.   levothyroxine 200 MCG tablet Commonly known as:  SYNTHROID, LEVOTHROID Take 200 mcg by mouth daily before breakfast.   lisinopril 20 MG tablet Commonly known as:  PRINIVIL,ZESTRIL Take 20 mg by mouth every morning.   MATZIM LA 420 MG 24 hr tablet Generic drug:  diltiazem Take 420 mg by mouth every morning.   metFORMIN 1000 MG tablet Commonly known as:  GLUCOPHAGE Take 1,000 mg by mouth 2 (two) times daily with a meal.       Vitals:   10/27/17 2024 10/28/17 0532  BP: (!) 153/92 134/78  Pulse: 93 69  Resp: 20 18  Temp: 98.4 F (36.9 C) 98.3 F (36.8 C)  SpO2: 95% 96%    Skin clean, dry and intact without evidence of skin break down, no evidence of skin tears noted. IV catheter discontinued intact. Site without signs and symptoms of complications. Dressing and pressure applied. Pt denies pain at this time. No complaints noted.  An After Visit Summary was printed and given to the patient. Patient escorted via family, and D/C home via private auto.  Sharalyn Ink

## 2017-10-28 NOTE — Telephone Encounter (Signed)
Can we forward my note to surgical team

## 2017-10-28 NOTE — Anesthesia Postprocedure Evaluation (Signed)
Anesthesia Post Note  Patient: Shane Frazier.  Procedure(s) Performed: HERNIA REPAIR VENTRAL ADULT (N/A )  Patient location during evaluation: PACU Anesthesia Type: General Level of consciousness: awake and alert Pain management: pain level controlled Vital Signs Assessment: post-procedure vital signs reviewed and stable Respiratory status: spontaneous breathing, nonlabored ventilation, respiratory function stable and patient connected to nasal cannula oxygen Cardiovascular status: blood pressure returned to baseline and stable Postop Assessment: no apparent nausea or vomiting Anesthetic complications: no     Last Vitals:  Vitals:   10/27/17 2024 10/28/17 0532  BP: (!) 153/92 134/78  Pulse: 93 69  Resp: 20 18  Temp: 36.9 C 36.8 C  SpO2: 95% 96%    Last Pain:  Vitals:   10/28/17 0559  TempSrc:   PainSc: 0-No pain                 Martha Clan

## 2017-10-29 ENCOUNTER — Encounter: Payer: Self-pay | Admitting: Surgery

## 2017-10-30 ENCOUNTER — Telehealth: Payer: Self-pay | Admitting: General Practice

## 2017-10-30 ENCOUNTER — Telehealth: Payer: Self-pay

## 2017-10-30 NOTE — Telephone Encounter (Signed)
Office note from 10/25/17 with Dr Rockey Situ faxed to Dr Mont Dutton office.

## 2017-10-30 NOTE — Telephone Encounter (Signed)
Patient's calling and has some questions about his drain. Please call patient and advise.

## 2017-10-30 NOTE — Telephone Encounter (Signed)
Cardiac Clearance obtained from [place].

## 2017-10-30 NOTE — Telephone Encounter (Signed)
Patient states he is having very little drainage and wanted to let us know. He is scheduled 11/01/17 and was instructed to continue with recording output. Patient doing well at this time.

## 2017-10-31 ENCOUNTER — Telehealth: Payer: Self-pay | Admitting: Surgery

## 2017-10-31 LAB — SURGICAL PATHOLOGY

## 2017-10-31 NOTE — Telephone Encounter (Signed)
Patient came by the office and dropped off fmla paperwork. Paperwork has been placed in folder up front and has been paid.

## 2017-11-01 ENCOUNTER — Ambulatory Visit (INDEPENDENT_AMBULATORY_CARE_PROVIDER_SITE_OTHER): Payer: BLUE CROSS/BLUE SHIELD | Admitting: Surgery

## 2017-11-01 ENCOUNTER — Telehealth: Payer: Self-pay

## 2017-11-01 ENCOUNTER — Encounter: Payer: Self-pay | Admitting: Surgery

## 2017-11-01 ENCOUNTER — Ambulatory Visit: Payer: BLUE CROSS/BLUE SHIELD | Admitting: Internal Medicine

## 2017-11-01 VITALS — BP 172/114 | HR 87 | Temp 98.3°F | Wt 227.0 lb

## 2017-11-01 DIAGNOSIS — Z09 Encounter for follow-up examination after completed treatment for conditions other than malignant neoplasm: Secondary | ICD-10-CM

## 2017-11-01 NOTE — Telephone Encounter (Signed)
Rosann Auerbach, RN from [employer] will be feeling out patient's disability/FMLA form with the appropriate dates for patient to return to work with no restrictions. This will be faxed.

## 2017-11-01 NOTE — Patient Instructions (Signed)

## 2017-11-01 NOTE — Progress Notes (Signed)
S/p open ventral hernia w drain Less 20cc day + PO, no fevers or chills  PE NAD Abd: incision healing well, no infection staples in place. Drain removed  A/P Doing well RTC Nurse visit for satple removal No heavy lifting x 6 weeks ( < 20 lbs) after surgery

## 2017-11-02 ENCOUNTER — Encounter: Admission: RE | Payer: Self-pay | Source: Ambulatory Visit

## 2017-11-02 ENCOUNTER — Telehealth: Payer: Self-pay

## 2017-11-02 ENCOUNTER — Ambulatory Visit: Admission: RE | Admit: 2017-11-02 | Payer: BLUE CROSS/BLUE SHIELD | Source: Ambulatory Visit | Admitting: Surgery

## 2017-11-02 SURGERY — REPAIR, HERNIA, VENTRAL, LAPAROSCOPIC
Anesthesia: General

## 2017-11-02 NOTE — Telephone Encounter (Signed)
Disability paperwork completed with return to work date 12/11/17 faxed to [employer] at this time.  Patient notified. Placed in scan folder.

## 2017-11-06 ENCOUNTER — Encounter: Payer: BLUE CROSS/BLUE SHIELD | Admitting: Surgery

## 2017-11-07 ENCOUNTER — Ambulatory Visit (INDEPENDENT_AMBULATORY_CARE_PROVIDER_SITE_OTHER): Payer: BLUE CROSS/BLUE SHIELD

## 2017-11-07 VITALS — BP 161/115 | HR 90 | Temp 97.8°F | Ht 67.5 in | Wt 227.4 lb

## 2017-11-07 DIAGNOSIS — Z09 Encounter for follow-up examination after completed treatment for conditions other than malignant neoplasm: Secondary | ICD-10-CM

## 2017-11-07 NOTE — Progress Notes (Signed)
Incision healing well. No redness or drainage. Staples removed and steri strips placed. Patient instructed to call office if incision shows any signs/symptoms of infection.

## 2017-11-07 NOTE — Patient Instructions (Signed)
We have removed the staples today. We placed steri strips. These will begin to curl on the ends and fall off 7-10 days.  Please call if you have any questions or concerns. You may shower as usual.

## 2017-11-09 LAB — HIV ANTIBODY (ROUTINE TESTING W REFLEX): HIV Screen 4th Generation wRfx: NONREACTIVE

## 2017-12-06 ENCOUNTER — Ambulatory Visit: Payer: BLUE CROSS/BLUE SHIELD | Admitting: Family Medicine

## 2017-12-06 ENCOUNTER — Encounter: Payer: Self-pay | Admitting: Family Medicine

## 2017-12-06 VITALS — BP 138/88 | HR 79 | Temp 98.8°F | Resp 16 | Ht 67.0 in | Wt 228.6 lb

## 2017-12-06 DIAGNOSIS — Z7689 Persons encountering health services in other specified circumstances: Secondary | ICD-10-CM | POA: Diagnosis not present

## 2017-12-06 DIAGNOSIS — Z23 Encounter for immunization: Secondary | ICD-10-CM

## 2017-12-06 DIAGNOSIS — I10 Essential (primary) hypertension: Secondary | ICD-10-CM

## 2017-12-06 DIAGNOSIS — E11319 Type 2 diabetes mellitus with unspecified diabetic retinopathy without macular edema: Secondary | ICD-10-CM | POA: Diagnosis not present

## 2017-12-06 NOTE — Patient Instructions (Addendum)
Thank you for coming to the office today.  No changes to medicines today - you have plenty for now.  We will request records from Dr Clemmie Krill to review  Also we will check blood work in 3 months - and discuss  Please check BP at home and write down readings. 1 to 2 times a week.  Colon Cancer Screening: - For all adults age 52+ routine colon cancer screening is highly recommended.     - Recent guidelines from [place] recommend starting age of 74 - Early detection of colon cancer is important, because often there are no warning signs or symptoms, also if found early usually it can be cured. Late stage is hard to treat.  - If you are not interested in Colonoscopy screening (if done and normal you could be cleared for 5 to 10 years until next due), then Cologuard is an excellent alternative for screening test for Colon Cancer. It is highly sensitive for detecting DNA of colon cancer from even the earliest stages. Also, there is NO bowel prep required. - If Cologuard is NEGATIVE, then it is good for 3 years before next due - If Cologuard is POSITIVE, then it is strongly advised to get a Colonoscopy, which allows the GI doctor to locate the source of the cancer or polyp (even very early stage) and treat it by removing it. ------------------------- If you would like to proceed with Cologuard (stool DNA test) - FIRST, call your insurance company and tell them you want to check cost of Cologuard tell them CPT Code [phone] (it may be completely covered and you could get for no cost, OR max cost without any coverage is about $600). Also, keep in mind if you do NOT open the kit, and decide not to do the test, you will NOT be charged, you should contact the company if you decide not to do the test. - If you want to proceed, you can notify us (phone message, [place], or at next visit) and we will order it for you. The test kit will be delivered to you house within about 1 week. Follow  instructions to collect sample, you may call the company for any help or questions, 24/7 telephone support at [phone].   DUE for FASTING BLOOD WORK (no food or drink after midnight before the lab appointment, only water or coffee without cream/sugar on the morning of)  SCHEDULE "Lab Only" visit in the morning at the clinic for lab draw in 3 MONTHS   - Make sure Lab Only appointment is at about 1 week before your next appointment, so that results will be available  For Lab Results, once available within 2-3 days of blood draw, you can can log in to MyChart online to view your results and a brief explanation. Also, we can discuss results at next follow-up visit.  Please schedule a Follow-up Appointment to: Return in about 3 months (around 03/08/2018) for Annual Physical.  If you have any other questions or concerns, please feel free to call the office or send a message through [place]. You may also schedule an earlier appointment if necessary.  Additionally, you may be receiving a survey about your experience at our office within a few days to 1 week by e-mail or mail. We value your feedback.  [name], DO [place]

## 2017-12-06 NOTE — Progress Notes (Signed)
Subjective:    Patient ID: Shane Picket., male    DOB: June 15, 1965, 52 y.o.   MRN: [id number]  Shane Teall. is a 52 y.o. male presenting on 12/06/2017 for Establish Care (HTN); Hypertension; and Diabetes  Previous PCP Dr Clemmie Krill. Here now to establish care with new PCP.  HPI   CHRONIC HTN: Reports prior history HTN. On various meds, then stopped most meds. Current Meds - Diltiazem 420mg  daily (24 hr tab) - Has meds but no longer taking: Lisinopril 20mg  daily, HCTZ 25mg  daily  Poor compliance - not taking med every day only taking Diltiazem 420mg  Admits some reduced libido when taking BP meds Denies CP, dyspnea, HA, edema, dizziness / lightheadedness  CHRONIC DM, Type 2: Reports no concerns, uncertain X6P control, requesting records. CBGs: None Meds: Metformin 1000mg  BID, Glimepiride 4mg  daily Reports good compliance. Tolerating well w/o side-effects Currently on ACEi Lifestyle: - Diet (not following DM diet)  Denies hypoglycemia, polyuria, visual changes, numbness or tingling.  HYPERLIPIDEMIA: - Reports no concerns. Last lipid panel within past year  - No longer taking Atorvastatin 20mg  daily  Hypothyroidism Prior history, was taking Levothyroxine 233mcg daily by med rec and has med, has other multiple doses ranging from 150 to 184mcg daily but he is no longer taking any of them, admits does not think he needs.   Health Maintenance:  Due TDap, will get today  Due for repeat colonoscopy. Prior test in age 38s. Now due at age 80 for screening, request referral at next apt.  Depression screen PHQ 2/9 12/06/2017  Decreased Interest 0  Down, Depressed, Hopeless 0  PHQ - 2 Score 0    Past Medical History:  Diagnosis Date  . Concussion 2017   cause of seizure after hit head at work  . Difficult intubation    patient unaware of this diagnosis  . Hypothyroidism   . Seizures ([place]) 05-20-15   X 1-PT HIT HIS HEAD AT WORK AND THEN HAD A SEIZURE   Past Surgical History:    Procedure Laterality Date  . HERNIA REPAIR     as a child,umbilical  . SHOULDER ARTHROSCOPY WITH ROTATOR CUFF REPAIR AND SUBACROMIAL DECOMPRESSION Left 09/09/2015   Procedure: SHOULDER ARTHROSCOPY, SUBACROMIAL DECOMPRESSION,BICEPS TENSON RELEASE AND MINI OPEN ROTATOR CUFF REPAIR;  Surgeon: Leanor Kail, MD;  Location: [facility] ORS;  Service: Orthopedics;  Laterality: Left;  Marland Kitchen VENTRAL HERNIA REPAIR N/A 10/27/2017   Procedure: HERNIA REPAIR VENTRAL ADULT;  Surgeon: Jules Husbands, MD;  Location: [facility] ORS;  Service: General;  Laterality: N/A;   Social History   Socioeconomic History  . Marital status: Single    Spouse name: Not on file  . Number of children: Not on file  . Years of education: Not on file  . Highest education level: Not on file  Occupational History  . Not on file  Social Needs  . Financial resource strain: Not on file  . Food insecurity:    Worry: Not on file    Inability: Not on file  . Transportation needs:    Medical: Not on file    Non-medical: Not on file  Tobacco Use  . Smoking status: Never Smoker  . Smokeless tobacco: Current User    Types: Chew  Substance and Sexual Activity  . Alcohol use: Yes    Comment: occasionally. rarely  . Drug use: No  . Sexual activity: Yes  Lifestyle  . Physical activity:    Days per week: Not on file  Minutes per session: Not on file  . Stress: Not on file  Relationships  . Social connections:    Talks on phone: Not on file    Gets together: Not on file    Attends religious service: Not on file    Active member of club or organization: Not on file    Attends meetings of clubs or organizations: Not on file    Relationship status: Not on file  . Intimate partner violence:    Fear of current or ex partner: Not on file    Emotionally abused: Not on file    Physically abused: Not on file    Forced sexual activity: Not on file  Other Topics Concern  . Not on file  Social History Narrative  . Not on file   Family  History  Problem Relation Age of Onset  . Hypertension Mother   . Cancer Father   . Diabetes Sister   . Thyroid cancer Sister    Current Outpatient Medications on File Prior to Visit  Medication Sig  . diltiazem (MATZIM LA) 420 MG 24 hr tablet Take 420 mg by mouth every morning.  . metFORMIN (GLUCOPHAGE) 1000 MG tablet Take 1,000 mg by mouth 2 (two) times daily with a meal.  . atorvastatin (LIPITOR) 20 MG tablet Take 20 mg by mouth every morning.   Marland Kitchen glimepiride (AMARYL) 4 MG tablet Take 4 mg by mouth daily with breakfast.  . hydrochlorothiazide (HYDRODIURIL) 25 MG tablet Take 25 mg by mouth daily.  Marland Kitchen levothyroxine (SYNTHROID, LEVOTHROID) 200 MCG tablet Take 200 mcg by mouth daily before breakfast.   . lisinopril (PRINIVIL,ZESTRIL) 20 MG tablet Take 20 mg by mouth every morning.    No current facility-administered medications on file prior to visit.     Review of Systems Per HPI unless specifically indicated above     Objective:    BP 138/88 (BP Location: Left Arm, Cuff Size: Normal)   Pulse 79   Temp 98.8 F (37.1 C) (Oral)   Resp 16   Ht 5\' 7"  (1.702 m)   Wt 228 lb 9.6 oz (103.7 kg)   BMI 35.80 kg/m   Wt Readings from Last 3 Encounters:  12/06/17 228 lb 9.6 oz (103.7 kg)  11/07/17 227 lb 6.4 oz (103.1 kg)  11/01/17 227 lb (103 kg)    Physical Exam  Constitutional: He is oriented to person, place, and time. He appears well-developed and well-nourished. No distress.  Well-appearing, comfortable, cooperative  HENT:  Head: Normocephalic and atraumatic.  Mouth/Throat: Oropharynx is clear and moist.  Eyes: Conjunctivae are normal. Right eye exhibits no discharge. Left eye exhibits no discharge.  Neck: Normal range of motion. Neck supple. No thyromegaly present.  Cardiovascular: Normal rate, regular rhythm, normal heart sounds and intact distal pulses.  No murmur heard. Pulmonary/Chest: Effort normal and breath sounds normal. No respiratory distress. He has no wheezes. He  has no rales.  Musculoskeletal: Normal range of motion. He exhibits no edema.  Lymphadenopathy:    He has no cervical adenopathy.  Neurological: He is alert and oriented to person, place, and time.  Skin: Skin is warm and dry. No rash noted. He is not diaphoretic. No erythema.  Psychiatric: He has a normal mood and affect. His behavior is normal.  Well groomed, good eye contact, normal speech and thoughts  Nursing note and vitals reviewed.  Results for orders placed or performed during the hospital encounter of 10/27/17  Lipase, blood  Result Value Ref Range  Lipase 26 11 - 51 U/L  Comprehensive metabolic panel  Result Value Ref Range   Sodium 139 135 - 145 mmol/L   Potassium 3.1 (L) 3.5 - 5.1 mmol/L   Chloride 96 (L) 98 - 111 mmol/L   CO2 33 (H) 22 - 32 mmol/L   Glucose, Bld 196 (H) 70 - 99 mg/dL   BUN 13 6 - 20 mg/dL   Creatinine, Ser 1.20 0.61 - 1.24 mg/dL   Calcium 9.5 8.9 - 10.3 mg/dL   Total Protein 7.9 6.5 - 8.1 g/dL   Albumin 4.1 3.5 - 5.0 g/dL   AST 20 15 - 41 U/L   ALT 17 0 - 44 U/L   Alkaline Phosphatase 48 38 - 126 U/L   Total Bilirubin 1.1 0.3 - 1.2 mg/dL   GFR calc non Af Amer >60 >60 mL/min   GFR calc Af Amer >60 >60 mL/min   Anion gap 10 5 - 15  CBC  Result Value Ref Range   WBC 9.5 3.8 - 10.6 K/uL   RBC 5.82 4.40 - 5.90 MIL/uL   Hemoglobin 17.3 13.0 - 18.0 g/dL   HCT 50.4 40.0 - 52.0 %   MCV 86.7 80.0 - 100.0 fL   MCH 29.7 26.0 - 34.0 pg   MCHC 34.2 32.0 - 36.0 g/dL   RDW 13.6 11.5 - 14.5 %   Platelets 242 150 - 440 K/uL  Glucose, capillary  Result Value Ref Range   Glucose-Capillary 183 (H) 70 - 99 mg/dL  Basic metabolic panel  Result Value Ref Range   Sodium 139 135 - 145 mmol/L   Potassium 3.4 (L) 3.5 - 5.1 mmol/L   Chloride 98 98 - 111 mmol/L   CO2 33 (H) 22 - 32 mmol/L   Glucose, Bld 212 (H) 70 - 99 mg/dL   BUN 14 6 - 20 mg/dL   Creatinine, Ser 1.26 (H) 0.61 - 1.24 mg/dL   Calcium 8.4 (L) 8.9 - 10.3 mg/dL   GFR calc non Af Amer >60 >60  mL/min   GFR calc Af Amer >60 >60 mL/min   Anion gap 8 5 - 15  CBC  Result Value Ref Range   WBC 12.8 (H) 3.8 - 10.6 K/uL   RBC 4.98 4.40 - 5.90 MIL/uL   Hemoglobin 14.7 13.0 - 18.0 g/dL   HCT 43.5 40.0 - 52.0 %   MCV 87.4 80.0 - 100.0 fL   MCH 29.6 26.0 - 34.0 pg   MCHC 33.9 32.0 - 36.0 g/dL   RDW 13.3 11.5 - 14.5 %   Platelets 195 150 - 440 K/uL  HIV antibody (Routine Testing)  Result Value Ref Range   HIV Screen 4th Generation wRfx Non Reactive Non Reactive  Glucose, capillary  Result Value Ref Range   Glucose-Capillary 254 (H) 70 - 99 mg/dL  Glucose, capillary  Result Value Ref Range   Glucose-Capillary 164 (H) 70 - 99 mg/dL  Surgical pathology  Result Value Ref Range   SURGICAL PATHOLOGY      Surgical Pathology CASE: [id number] PATIENT: Serita Grammes Surgical Pathology Report     SPECIMEN SUBMITTED: A. Omentum  CLINICAL HISTORY: None provided  PRE-OPERATIVE DIAGNOSIS: Incarcerated hernia  POST-OPERATIVE DIAGNOSIS: Same as pre-op     DIAGNOSIS: A.  OMENTUM; RESECTION: - FIBROADIPOSE TISSUE VASCULAR CONGESTION. - MESOTHELIAL LINED FIBROADIPOSE TISSUE, CONSISTENT WITH HERNIA SAC.   GROSS DESCRIPTION: A. Labeled: Omentum Received: In formalin Tissue fragment(s): Multiple Size: Aggregate, 12.5 x 9.3 x 2.8 cm Description: Yellow lobulated  fatty tissue with one edge of attached fibrous tissue, no masses identified on sectioning Representative submitted in one cassette.     Final Diagnosis performed by Delorse Lek, MD.   Electronically signed 10/31/2017 5:31:04PM The electronic signature indicates that the named Attending Pathologist has evaluated the specimen  Technical component performed at [facility], [address] [place], [place] [address]  Lab: [phone] Dir: Rush Farmer, MD, MMM  Professional component performed at [facility], [facility], [place], [place], [place] [address] Lab: [phone] Dir: Dellia Nims. Reuel Derby,  MD       Assessment & Plan:   Problem List Items Addressed This Visit    Essential hypertension - Primary    Mildly elevated initial BP, repeat manual check improved. - Home BP readings none  Elevated Cr w/o reduced GFR    Plan:  1. Continue current BP regimen - Diltiazem 420mg  daily - no change today, as he has other anti HTN meds here but not taking, attributed to libido, will check labs and monitor BP, he should check BP at home as well bring BP log to next visit 2. Encourage improved lifestyle - low sodium diet, regular exercise 3. Follow-up 3 months Annual phys and labs  Request prior records       Type 2 diabetes mellitus with diabetic retinopathy ([place])    Uncertain DM control, without recent A1c - request records Repeat labs A1c in 3 months Complicated by DM retinopathy - requesting eye exam report  Plan:  1. Continue current therapy - Metformin 1000mg  BID - Off sulfonylurea glimepiride 2. Encourage improved lifestyle - low carb, low sugar diet, reduce portion size, continue improving regular exercise 3. Check CBG, bring log to next visit for review 4. Follow-up 3 months - will be due DM foot, eye, PNAvax        Other Visit Diagnoses    Encounter to establish care with new doctor     Requested records from prior PCP Dr Clemmie Krill    Need for diphtheria-tetanus-pertussis (Tdap) vaccine       Relevant Orders   Tdap vaccine greater than or equal to 7yo IM (Completed)      #Hyperlipidemia - off statin now, will review records and re-discuss statin #Hypothyroidism - off levothyroxine, will review records, check TSH and rediscus med  No orders of the defined types were placed in this encounter.   Follow up plan: Return in about 3 months (around 03/08/2018) for Annual Physical.  Future labs ordered for 03/05/18  [name], [facility] 12/06/2017, 12:58 PM

## 2017-12-07 ENCOUNTER — Other Ambulatory Visit: Payer: Self-pay | Admitting: Family Medicine

## 2017-12-07 ENCOUNTER — Encounter: Payer: Self-pay | Admitting: Family Medicine

## 2017-12-07 DIAGNOSIS — E1169 Type 2 diabetes mellitus with other specified complication: Secondary | ICD-10-CM | POA: Insufficient documentation

## 2017-12-07 DIAGNOSIS — E11319 Type 2 diabetes mellitus with unspecified diabetic retinopathy without macular edema: Secondary | ICD-10-CM

## 2017-12-07 DIAGNOSIS — E785 Hyperlipidemia, unspecified: Secondary | ICD-10-CM

## 2017-12-07 DIAGNOSIS — Z0000 Encounter for general adult medical examination without abnormal findings: Secondary | ICD-10-CM

## 2017-12-07 DIAGNOSIS — Z125 Encounter for screening for malignant neoplasm of prostate: Secondary | ICD-10-CM

## 2017-12-07 DIAGNOSIS — E039 Hypothyroidism, unspecified: Secondary | ICD-10-CM | POA: Insufficient documentation

## 2017-12-07 DIAGNOSIS — I10 Essential (primary) hypertension: Secondary | ICD-10-CM

## 2017-12-07 NOTE — Assessment & Plan Note (Signed)
Uncertain DM control, without recent A1c - request records Repeat labs A1c in 3 months Complicated by DM retinopathy - requesting eye exam report  Plan:  1. Continue current therapy - Metformin 1000mg  BID - Off sulfonylurea glimepiride 2. Encourage improved lifestyle - low carb, low sugar diet, reduce portion size, continue improving regular exercise 3. Check CBG, bring log to next visit for review 4. Follow-up 3 months - will be due DM foot, eye, PNAvax

## 2017-12-07 NOTE — Assessment & Plan Note (Addendum)
Mildly elevated initial BP, repeat manual check improved. - Home BP readings none  Elevated Cr w/o reduced GFR    Plan:  1. Continue current BP regimen - Diltiazem 420mg  daily - no change today, as he has other anti HTN meds here but not taking, attributed to libido, will check labs and monitor BP, he should check BP at home as well bring BP log to next visit 2. Encourage improved lifestyle - low sodium diet, regular exercise 3. Follow-up 3 months Annual phys and labs  Request prior records

## 2017-12-11 ENCOUNTER — Encounter: Payer: Self-pay | Admitting: Family Medicine

## 2017-12-20 ENCOUNTER — Encounter: Payer: Self-pay | Admitting: Family Medicine

## 2018-02-12 LAB — HM DIABETES EYE EXAM

## 2018-03-02 ENCOUNTER — Encounter: Payer: Self-pay | Admitting: Family Medicine

## 2018-03-02 ENCOUNTER — Ambulatory Visit: Payer: BLUE CROSS/BLUE SHIELD | Admitting: Family Medicine

## 2018-03-02 ENCOUNTER — Ambulatory Visit (INDEPENDENT_AMBULATORY_CARE_PROVIDER_SITE_OTHER): Payer: BLUE CROSS/BLUE SHIELD | Admitting: Family Medicine

## 2018-03-02 ENCOUNTER — Ambulatory Visit
Admission: RE | Admit: 2018-03-02 | Discharge: 2018-03-02 | Disposition: A | Discharge status: Home / Self Care | Payer: BLUE CROSS/BLUE SHIELD | Source: Ambulatory Visit | Attending: Family Medicine | Admitting: Family Medicine

## 2018-03-02 VITALS — BP 156/103 | HR 88 | Temp 98.6°F | Resp 16 | Ht 67.0 in | Wt 215.0 lb

## 2018-03-02 DIAGNOSIS — R0989 Other specified symptoms and signs involving the circulatory and respiratory systems: Secondary | ICD-10-CM

## 2018-03-02 DIAGNOSIS — R918 Other nonspecific abnormal finding of lung field: Secondary | ICD-10-CM

## 2018-03-02 DIAGNOSIS — R0601 Orthopnea: Secondary | ICD-10-CM

## 2018-03-02 MED ORDER — BENZONATATE 100 MG PO CAPS: 100.0000 mg | ORAL_CAPSULE | Freq: Three times a day (TID) | ORAL | 0 refills | Status: DC | PRN

## 2018-03-02 MED ORDER — AMOXICILLIN-POT CLAVULANATE 875-125 MG PO TABS: 1.0000 | ORAL_TABLET | Freq: Two times a day (BID) | ORAL | 0 refills | Status: DC

## 2018-03-02 MED ORDER — AZITHROMYCIN 250 MG PO TABS: ORAL_TABLET | ORAL | 0 refills | Status: DC

## 2018-03-02 NOTE — Progress Notes (Signed)
Subjective:    Patient ID: Shane Frazier., male    DOB: 10/09/1965, 52 y.o.   MRN: [id number]  Henri Baumler. is a 52 y.o. male presenting on 03/02/2018 for chest congestion (onset 2 weeks cough)  Patient presents for a same day appointment.  HPI  Cough / Chest Congestion / Orthopnea Reports symptoms onset for past 2 weeks with cough only at night, difficulty breathing at that time, usually improve with sitting up or sleeping recliner. He has tried Mucinex with temporary relief. Never happened before to him. Works outside, thinks weather change may have caused it No known sick contact Prior EKG but no ECHO. Last Chest X-ray 2017 had some atelectiasis Denies any edema, chest pain or pressure, hemoptysis, productive cough  Health Maintenance: Due for Flu shot, will return in future. -declines today  Depression screen PHQ 2/9 12/06/2017  Decreased Interest 0  Down, Depressed, Hopeless 0  PHQ - 2 Score 0    Social History   Tobacco Use  . Smoking status: Never Smoker  . Smokeless tobacco: Current User    Types: Chew  Substance Use Topics  . Alcohol use: Yes    Comment: occasionally. rarely  . Drug use: No    Review of Systems Per HPI unless specifically indicated above     Objective:    BP (!) 156/103   Pulse 88   Temp 98.6 F (37 C) (Oral)   Resp 16   Ht 5\' 7"  (1.702 m)   Wt 215 lb (97.5 kg)   SpO2 99%   BMI 33.67 kg/m   Wt Readings from Last 3 Encounters:  03/02/18 215 lb (97.5 kg)  12/06/17 228 lb 9.6 oz (103.7 kg)  11/07/17 227 lb 6.4 oz (103.1 kg)    Physical Exam  Constitutional: He is oriented to person, place, and time. He appears well-developed and well-nourished. No distress.  Well-appearing, comfortable, cooperative  HENT:  Head: Normocephalic and atraumatic.  Mouth/Throat: Oropharynx is clear and moist.  Eyes: Conjunctivae are normal. Right eye exhibits no discharge. Left eye exhibits no discharge.  Cardiovascular: Normal rate, regular  rhythm, normal heart sounds and intact distal pulses.  No murmur heard. Pulmonary/Chest: Effort normal. No respiratory distress. He has no rales.  Mild diffuse reduced breath sounds, non focal, without coarse or wheezing. No coughing.  Musculoskeletal: Normal range of motion. He exhibits no edema.  Neurological: He is alert and oriented to person, place, and time.  Skin: Skin is warm and dry. No rash noted. He is not diaphoretic. No erythema.  Psychiatric: He has a normal mood and affect. His behavior is normal.  Well groomed, good eye contact, normal speech and thoughts  Nursing note and vitals reviewed.  I have personally reviewed the radiology report from 03/02/18 - Chest X-ray.  CLINICAL DATA:  Cough and dyspnea  EXAM: CHEST - 2 VIEW  COMPARISON:  None.  FINDINGS: Cardiac shadow is at the upper limits of normal in size. The lungs are well aerated bilaterally. Some mild right lower lobe infiltrate is seen posteriorly. No other focal infiltrate is seen. No effusion is noted. No bony abnormality is seen.  IMPRESSION: Mild right lower lobe infiltrate.   Electronically Signed   By: Inez Catalina M.D.   On: 03/02/2018 13:55  Results for orders placed or performed in visit on 12/20/17  Microalbumin, urine  Result Value Ref Range   Microalb, Ur 30   Basic metabolic panel  Result Value Ref Range  Glucose 141    BUN 17 4 - 21   Creatinine 1.4 (A) 0.6 - 1.3   Potassium 3.7 3.4 - 5.3   Sodium 141 137 - 147  Hepatic function panel  Result Value Ref Range   Alkaline Phosphatase 59 25 - 125   ALT 18 10 - 40   AST 21 14 - 40   Bilirubin, Total 0.3   Hemoglobin A1c  Result Value Ref Range   Hemoglobin A1C 9.8   Basic metabolic panel  Result Value Ref Range   Glucose 189    BUN 15 4 - 21   Creatinine 1.2 0.6 - 1.3   Potassium 4.0 3.4 - 5.3   Sodium 138 137 - 147  Lipid panel  Result Value Ref Range   Triglycerides 71 40 - 160   Cholesterol 202 (A) 0 - 200    HDL 53 35 - 70   LDL Cholesterol 135   Hepatic function panel  Result Value Ref Range   Alkaline Phosphatase 73 25 - 125   ALT 22 10 - 40   AST 19 14 - 40  Vitamin B12  Result Value Ref Range   Vitamin B-12 420   Hemoglobin A1c  Result Value Ref Range   Hemoglobin A1C 9.9   TSH  Result Value Ref Range   TSH 19.09 (A) 0.41 - 5.90      Assessment & Plan:   Problem List Items Addressed This Visit    None    Visit Diagnoses    Right lower lobe pulmonary infiltrate    -  Primary   Relevant Medications   amoxicillin-clavulanate (AUGMENTIN) 875-125 MG tablet   azithromycin (ZITHROMAX Z-PAK) 250 MG tablet   benzonatate (TESSALON) 100 MG capsule   Orthopnea       Relevant Orders   DG Chest 2 View (Completed)   Chest congestion       Relevant Orders   DG Chest 2 View (Completed)     Clinically suggestive orthopnea of either pulm edema with possible new concern for CHF otherwise chest congestion causing symptoms with positional orthopnea, as he does not have LE edema or known heart history. - Exam is non specific just reduced breath sounds lower fields - Afebrile, no other systemic signs of infection - No focal wheezing - Afebrile, tachycardic, no hypoxia (99% on RA)  Plan: STAT Chest X-ray today - results reviewed after patient has left from office, and called patient, confirmed mild RLL infiltrate, suggestive of pneumonia, no obvious fluid or edema seen, which is reassuring - Start antibiotics for CAP with Augmentin BID x 10 days and Azithromycin Zpak dosing - Start Tessalon Perls take 1 capsule up to 3 times a day as needed for cough - Recommend improve hydration and add Mucinex-DM twice daily for up to 5-7 days if sputum still thick Tylenol, Ibuprofen PRN fevers RTC within 1 week if not improving, otherwise strict return criteria to go to ED  May notify us if cough worse - we can add prednisone if need    Meds ordered this encounter  Medications  .  amoxicillin-clavulanate (AUGMENTIN) 875-125 MG tablet    Sig: Take 1 tablet by mouth 2 (two) times daily. For 10 days    Dispense:  20 tablet    Refill:  0  . azithromycin (ZITHROMAX Z-PAK) 250 MG tablet    Sig: Take 2 tabs (500mg  total) on Day 1. Take 1 tab (250mg ) daily for next 4 days.  Dispense:  6 tablet    Refill:  0  . benzonatate (TESSALON) 100 MG capsule    Sig: Take 1 capsule (100 mg total) by mouth 3 (three) times daily as needed for cough.    Dispense:  30 capsule    Refill:  0    Follow up plan: Return in about 1 week (around 03/09/2018), or if symptoms worsen or fail to improve, for keep apt as scheduled.  [name], DO [facility] 03/02/2018, 2:17 PM

## 2018-03-02 NOTE — Patient Instructions (Addendum)
Thank you for coming to the office today.  Shane Frazier today  Stay tuned for results  May consider Fluid Pill and heart testing  Or we can try antibiotic / prednisone / mucinex for chest congestion if needed  If you have any significant chest pain that does not go away within 30 minutes, is accompanied by nausea, sweating, shortness of breath, or made worse by activity, this may be evidence of a heart attack, especially if symptoms worsening instead of improving, please call 911 or go directly to the emergency room immediately for evaluation.   Please schedule a Follow-up Appointment to: Return in about 1 week (around 03/09/2018), or if symptoms worsen or fail to improve, for keep apt as scheduled.  If you have any other questions or concerns, please feel free to call the office or send a message through [place]. You may also schedule an earlier appointment if necessary.  Additionally, you may be receiving a survey about your experience at our office within a few days to 1 week by e-mail or mail. We value your feedback.  Shane Putnam, DO [place]

## 2018-03-05 ENCOUNTER — Other Ambulatory Visit: Payer: BLUE CROSS/BLUE SHIELD

## 2018-03-05 DIAGNOSIS — E039 Hypothyroidism, unspecified: Secondary | ICD-10-CM

## 2018-03-05 DIAGNOSIS — I10 Essential (primary) hypertension: Secondary | ICD-10-CM

## 2018-03-05 DIAGNOSIS — Z125 Encounter for screening for malignant neoplasm of prostate: Secondary | ICD-10-CM

## 2018-03-05 DIAGNOSIS — E11319 Type 2 diabetes mellitus with unspecified diabetic retinopathy without macular edema: Secondary | ICD-10-CM

## 2018-03-05 DIAGNOSIS — E785 Hyperlipidemia, unspecified: Secondary | ICD-10-CM

## 2018-03-05 DIAGNOSIS — Z0000 Encounter for general adult medical examination without abnormal findings: Secondary | ICD-10-CM

## 2018-03-06 LAB — TSH: TSH: 6.78 mIU/L — ABNORMAL HIGH (ref 0.40–4.50)

## 2018-03-06 LAB — CBC WITH DIFFERENTIAL/PLATELET
BASOS ABS: 58 {cells}/uL (ref 0–200)
Basophils Relative: 0.9 %
Eosinophils Absolute: 90 cells/uL (ref 15–500)
Eosinophils Relative: 1.4 %
HCT: 49.4 % (ref 38.5–50.0)
Hemoglobin: 17.1 g/dL (ref 13.2–17.1)
Lymphs Abs: 1165 cells/uL (ref 850–3900)
MCH: 29.8 pg (ref 27.0–33.0)
MCHC: 34.6 g/dL (ref 32.0–36.0)
MCV: 86.1 fL (ref 80.0–100.0)
MONOS PCT: 8.3 %
MPV: 12.4 fL (ref 7.5–12.5)
NEUTROS PCT: 71.2 %
Neutro Abs: 4557 cells/uL (ref 1500–7800)
PLATELETS: 259 10*3/uL (ref 140–400)
RBC: 5.74 10*6/uL (ref 4.20–5.80)
RDW: 12.2 % (ref 11.0–15.0)
TOTAL LYMPHOCYTE: 18.2 %
WBC: 6.4 10*3/uL (ref 3.8–10.8)
WBCMIX: 531 {cells}/uL (ref 200–950)

## 2018-03-06 LAB — LIPID PANEL
CHOL/HDL RATIO: 3.4 (calc) (ref <5.0)
Cholesterol: 220 mg/dL — ABNORMAL HIGH (ref <200)
HDL: 65 mg/dL (ref >40)
LDL Cholesterol (Calc): 139 mg/dL (calc) — ABNORMAL HIGH
NON-HDL CHOLESTEROL (CALC): 155 mg/dL — AB (ref <130)
TRIGLYCERIDES: 69 mg/dL (ref <150)

## 2018-03-06 LAB — COMPLETE METABOLIC PANEL WITH GFR
AG Ratio: 1.3 (calc) (ref 1.0–2.5)
ALT: 18 U/L (ref 9–46)
AST: 22 U/L (ref 10–35)
Albumin: 3.9 g/dL (ref 3.6–5.1)
Alkaline phosphatase (APISO): 61 U/L (ref 40–115)
BILIRUBIN TOTAL: 0.8 mg/dL (ref 0.2–1.2)
BUN: 11 mg/dL (ref 7–25)
CO2: 31 mmol/L (ref 20–32)
CREATININE: 1.14 mg/dL (ref 0.70–1.33)
Calcium: 9.5 mg/dL (ref 8.6–10.3)
Chloride: 100 mmol/L (ref 98–110)
GFR, EST NON AFRICAN AMERICAN: 74 mL/min/{1.73_m2} (ref >=60)
GFR, Est African American: 86 mL/min/{1.73_m2} (ref >=60)
GLUCOSE: 168 mg/dL — AB (ref 65–99)
Globulin: 3 g/dL (calc) (ref 1.9–3.7)
POTASSIUM: 4.1 mmol/L (ref 3.5–5.3)
SODIUM: 141 mmol/L (ref 135–146)
TOTAL PROTEIN: 6.9 g/dL (ref 6.1–8.1)

## 2018-03-06 LAB — T4, FREE: FREE T4: 1.2 ng/dL (ref 0.8–1.8)

## 2018-03-06 LAB — HEMOGLOBIN A1C
HEMOGLOBIN A1C: 7.8 %{Hb} — AB (ref <5.7)
MEAN PLASMA GLUCOSE: 177 (calc)
eAG (mmol/L): 9.8 (calc)

## 2018-03-06 LAB — PSA: PSA: 0.3 ng/mL (ref <=4.0)

## 2018-03-09 ENCOUNTER — Encounter: Payer: Self-pay | Admitting: Family Medicine

## 2018-03-09 ENCOUNTER — Ambulatory Visit (INDEPENDENT_AMBULATORY_CARE_PROVIDER_SITE_OTHER): Payer: BLUE CROSS/BLUE SHIELD | Admitting: Family Medicine

## 2018-03-09 VITALS — BP 136/80 | HR 87 | Temp 98.4°F | Resp 16 | Ht 67.0 in | Wt 220.8 lb

## 2018-03-09 DIAGNOSIS — I10 Essential (primary) hypertension: Secondary | ICD-10-CM | POA: Diagnosis not present

## 2018-03-09 DIAGNOSIS — Z1211 Encounter for screening for malignant neoplasm of colon: Secondary | ICD-10-CM | POA: Diagnosis not present

## 2018-03-09 DIAGNOSIS — E785 Hyperlipidemia, unspecified: Secondary | ICD-10-CM

## 2018-03-09 DIAGNOSIS — Z0000 Encounter for general adult medical examination without abnormal findings: Secondary | ICD-10-CM

## 2018-03-09 DIAGNOSIS — E11319 Type 2 diabetes mellitus with unspecified diabetic retinopathy without macular edema: Secondary | ICD-10-CM

## 2018-03-09 DIAGNOSIS — E1169 Type 2 diabetes mellitus with other specified complication: Secondary | ICD-10-CM

## 2018-03-09 DIAGNOSIS — E039 Hypothyroidism, unspecified: Secondary | ICD-10-CM

## 2018-03-09 MED ORDER — DILTIAZEM HCL ER COATED BEADS 420 MG PO TB24: 420.0000 mg | ORAL_TABLET | ORAL | 1 refills | Status: DC

## 2018-03-09 NOTE — Progress Notes (Signed)
Subjective:    Patient ID: Shane Picket., male    DOB: 07-Mar-1966, 52 y.o.   MRN: [id number]  Shane Gorka. is a 52 y.o. male presenting on 03/09/2018 for Annual Exam   HPI   Here for Annual Physical and Lab Review  Follow-up CAP Last visit 03/02/18, on augmentin and Zpak Improved, occasional cough now but feels better Afebrile  CHRONIC HTN: Reports prior history HTN. On various meds, then stopped most meds. Current Meds - Diltiazem 420mg  daily (24 hr tab), Lisiniopril 20mg  daily, HCTZ 25mg  daily Now back taking all meds, only needs refill for diltiazem Admits some reduced libido when taking BP meds Denies CP, dyspnea, HA, edema, dizziness / lightheadedness  CHRONIC DM, Type 2: Reports no concerns. A1c improved from 9.8 down to 7.8 on last lab CBGs: None Meds: Metformin 1000mg  BID, Glimepiride 4mg  daily Reports good compliance. Tolerating well w/o side-effects Currently on ACEi Lifestyle: - Diet (trying to improve but not always following diabetes diet) Due to have DM Eye at [facility] - will request record Denies hypoglycemia  HYPERLIPIDEMIA: - Reports no concerns. Last lipids show still elevated LDL still - he is not adhering regularly to atorvastatin 20mg   Hypothyroidism Previously suboptimal control. Last lab show improved TSH to 6. On levothyroxine 271mcg daily, tolerating well  Additionally he reports going to "[facility]" and they are treating him with supplemental testosterone and monitoring labs.   Health Maintenance:   Due for repeat colonoscopy. Prior test in age 70s. Now due at age 39 for screening, request referral for next colonoscopy  PSA Prostate Cancer Screening - 0.3, negative.   Health Maintenance: Declines flu vaccine and pneumonia vaccine despite counseling  Depression screen [facility] 2/9 03/09/2018 12/06/2017  Decreased Interest 0 0  Down, Depressed, Hopeless 0 0  PHQ - 2 Score 0 0    Past Medical History:  Diagnosis  Date  . Concussion 2017   cause of seizure after hit head at work  . Difficult intubation    patient unaware of this diagnosis  . Hypothyroidism   . Seizures ([place]) 05-20-15   X 1-PT HIT HIS HEAD AT WORK AND THEN HAD A SEIZURE   Past Surgical History:  Procedure Laterality Date  . HERNIA REPAIR     as a child,umbilical  . SHOULDER ARTHROSCOPY WITH ROTATOR CUFF REPAIR AND SUBACROMIAL DECOMPRESSION Left 09/09/2015   Procedure: SHOULDER ARTHROSCOPY, SUBACROMIAL DECOMPRESSION,BICEPS TENSON RELEASE AND MINI OPEN ROTATOR CUFF REPAIR;  Surgeon: Leanor Kail, MD;  Location: [facility] ORS;  Service: Orthopedics;  Laterality: Left;  Marland Kitchen VENTRAL HERNIA REPAIR N/A 10/27/2017   Procedure: HERNIA REPAIR VENTRAL ADULT;  Surgeon: Jules Husbands, MD;  Location: [facility] ORS;  Service: General;  Laterality: N/A;   Social History   Socioeconomic History  . Marital status: Single    Spouse name: Not on file  . Number of children: Not on file  . Years of education: Not on file  . Highest education level: Not on file  Occupational History  . Not on file  Social Needs  . Financial resource strain: Not on file  . Food insecurity:    Worry: Not on file    Inability: Not on file  . Transportation needs:    Medical: Not on file    Non-medical: Not on file  Tobacco Use  . Smoking status: Never Smoker  . Smokeless tobacco: Current User    Types: Chew  Substance and Sexual Activity  . Alcohol use:  Yes    Comment: occasionally. rarely  . Drug use: No  . Sexual activity: Yes  Lifestyle  . Physical activity:    Days per week: Not on file    Minutes per session: Not on file  . Stress: Not on file  Relationships  . Social connections:    Talks on phone: Not on file    Gets together: Not on file    Attends religious service: Not on file    Active member of club or organization: Not on file    Attends meetings of clubs or organizations: Not on file    Relationship status: Not on file  . Intimate partner  violence:    Fear of current or ex partner: Not on file    Emotionally abused: Not on file    Physically abused: Not on file    Forced sexual activity: Not on file  Other Topics Concern  . Not on file  Social History Narrative  . Not on file   Family History  Problem Relation Age of Onset  . Hypertension Mother   . Cancer Father   . Diabetes Sister   . Thyroid cancer Sister    Current Outpatient Medications on File Prior to Visit  Medication Sig  . atorvastatin (LIPITOR) 20 MG tablet Take 20 mg by mouth every morning.   Marland Kitchen glimepiride (AMARYL) 4 MG tablet Take 4 mg by mouth daily with breakfast.  . hydrochlorothiazide (HYDRODIURIL) 25 MG tablet Take 25 mg by mouth daily.  Marland Kitchen levothyroxine (SYNTHROID, LEVOTHROID) 200 MCG tablet Take 200 mcg by mouth daily before breakfast.   . lisinopril (PRINIVIL,ZESTRIL) 20 MG tablet Take 20 mg by mouth every morning.   . metFORMIN (GLUCOPHAGE) 1000 MG tablet Take 1,000 mg by mouth 2 (two) times daily with a meal.   No current facility-administered medications on file prior to visit.     Review of Systems  Constitutional: Negative for activity change, appetite change, chills, diaphoresis, fatigue and fever.  HENT: Negative for congestion and hearing loss.   Eyes: Negative for visual disturbance.  Respiratory: Negative for apnea, cough, choking, chest tightness, shortness of breath and wheezing.   Cardiovascular: Negative for chest pain, palpitations and leg swelling.  Gastrointestinal: Negative for abdominal pain, anal bleeding, blood in stool, constipation, diarrhea, nausea and vomiting.  Endocrine: Negative for cold intolerance.  Genitourinary: Negative for difficulty urinating, dysuria, frequency and hematuria.  Musculoskeletal: Negative for arthralgias, back pain and neck pain.  Skin: Negative for rash.  Allergic/Immunologic: Negative for environmental allergies.  Neurological: Negative for dizziness, weakness, light-headedness, numbness  and headaches.  Hematological: Negative for adenopathy.  Psychiatric/Behavioral: Negative for behavioral problems, dysphoric mood and sleep disturbance. The patient is not nervous/anxious.    Per HPI unless specifically indicated above    Objective:    BP 136/80 (BP Location: Left Arm, Cuff Size: Normal)   Pulse 87   Temp 98.4 F (36.9 C) (Oral)   Resp 16   Ht 5\' 7"  (1.702 m)   Wt 220 lb 12.8 oz (100.2 kg)   BMI 34.58 kg/m   Wt Readings from Last 3 Encounters:  03/09/18 220 lb 12.8 oz (100.2 kg)  03/02/18 215 lb (97.5 kg)  12/06/17 228 lb 9.6 oz (103.7 kg)    Physical Exam  Constitutional: He is oriented to person, place, and time. He appears well-developed and well-nourished. No distress.  Well-appearing, comfortable, cooperative  HENT:  Head: Normocephalic and atraumatic.  Mouth/Throat: Oropharynx is clear and moist.  Eyes:  Pupils are equal, round, and reactive to light. Conjunctivae and EOM are normal. Right eye exhibits no discharge. Left eye exhibits no discharge.  Neck: Normal range of motion. Neck supple. No thyromegaly present.  Cardiovascular: Normal rate, regular rhythm, normal heart sounds and intact distal pulses.  No murmur heard. Pulmonary/Chest: Effort normal and breath sounds normal. No respiratory distress. He has no wheezes. He has no rales.  Abdominal: Soft. Bowel sounds are normal. He exhibits no distension and no mass. There is no tenderness.  Musculoskeletal: Normal range of motion. He exhibits no edema or tenderness.  Upper / Lower Extremities: - Normal muscle tone, strength bilateral upper extremities 5/5, lower extremities 5/5  Lymphadenopathy:    He has no cervical adenopathy.  Neurological: He is alert and oriented to person, place, and time.  Distal sensation intact to light touch all extremities  Skin: Skin is warm and dry. No rash noted. He is not diaphoretic. No erythema.  Psychiatric: He has a normal mood and affect. His behavior is normal.    Well groomed, good eye contact, normal speech and thoughts  Nursing note and vitals reviewed.    Diabetic Foot Exam - Simple   Simple Foot Form Diabetic Foot exam was performed with the following findings:  Yes 03/09/2018  4:08 PM  Visual Inspection See comments:  Yes Sensation Testing Intact to touch and monofilament testing bilaterally:  Yes Pulse Check Posterior Tibialis and Dorsalis pulse intact bilaterally:  Yes Comments Bilateral feet with callus formation heels and forefoot. No ulceration. Intact monofilament.     Results for orders placed or performed in visit on 03/05/18  T4, free  Result Value Ref Range   Free T4 1.2 0.8 - 1.8 ng/dL  TSH  Result Value Ref Range   TSH 6.78 (H) 0.40 - 4.50 mIU/L  PSA  Result Value Ref Range   PSA 0.3 < OR = 4.0 ng/mL  Lipid panel  Result Value Ref Range   Cholesterol 220 (H) <200 mg/dL   HDL 65 >40 mg/dL   Triglycerides 69 <150 mg/dL   LDL Cholesterol (Calc) 139 (H) mg/dL (calc)   Total CHOL/HDL Ratio 3.4 <5.0 (calc)   Non-HDL Cholesterol (Calc) 155 (H) <130 mg/dL (calc)  COMPLETE METABOLIC PANEL WITH GFR  Result Value Ref Range   Glucose, Bld 168 (H) 65 - 99 mg/dL   BUN 11 7 - 25 mg/dL   Creat 1.14 0.70 - 1.33 mg/dL   GFR, Est Non African American 74 > OR = 60 mL/min/1.80m2   GFR, Est African American 86 > OR = 60 mL/min/1.47m2   BUN/Creatinine Ratio NOT APPLICABLE 6 - 22 (calc)   Sodium 141 135 - 146 mmol/L   Potassium 4.1 3.5 - 5.3 mmol/L   Chloride 100 98 - 110 mmol/L   CO2 31 20 - 32 mmol/L   Calcium 9.5 8.6 - 10.3 mg/dL   Total Protein 6.9 6.1 - 8.1 g/dL   Albumin 3.9 3.6 - 5.1 g/dL   Globulin 3.0 1.9 - 3.7 g/dL (calc)   AG Ratio 1.3 1.0 - 2.5 (calc)   Total Bilirubin 0.8 0.2 - 1.2 mg/dL   Alkaline phosphatase (APISO) 61 40 - 115 U/L   AST 22 10 - 35 U/L   ALT 18 9 - 46 U/L  CBC with Differential/Platelet  Result Value Ref Range   WBC 6.4 3.8 - 10.8 Thousand/uL   RBC 5.74 4.20 - 5.80 Million/uL   Hemoglobin  17.1 13.2 - 17.1 g/dL   HCT  49.4 38.5 - 50.0 %   MCV 86.1 80.0 - 100.0 fL   MCH 29.8 27.0 - 33.0 pg   MCHC 34.6 32.0 - 36.0 g/dL   RDW 12.2 11.0 - 15.0 %   Platelets 259 140 - 400 Thousand/uL   MPV 12.4 7.5 - 12.5 fL   Neutro Abs 4,557 1,500 - 7,800 cells/uL   Lymphs Abs 1,165 850 - 3,900 cells/uL   WBC mixed population 531 200 - 950 cells/uL   Eosinophils Absolute 90 15 - 500 cells/uL   Basophils Absolute 58 0 - 200 cells/uL   Neutrophils Relative % 71.2 %   Total Lymphocyte 18.2 %   Monocytes Relative 8.3 %   Eosinophils Relative 1.4 %   Basophils Relative 0.9 %  Hemoglobin A1c  Result Value Ref Range   Hgb A1c MFr Bld 7.8 (H) <5.7 % of total Hgb   Mean Plasma Glucose 177 (calc)   eAG (mmol/L) 9.8 (calc)      Assessment & Plan:   Problem List Items Addressed This Visit    Essential hypertension    Mildly elevated initial BP, repeat manual check improved. Back on medicine - Home BP readings none  Improved creatinine    Plan:  1. Continue current BP regimen - refilled Diltiazem 420mg  daily, also continue other meds Lisinopril 20mg  and HCTZ 25mg  2. Encourage improved lifestyle - low sodium diet, regular exercise 3. Follow-up 6 months      Relevant Medications   diltiazem (MATZIM LA) 420 MG 24 hr tablet   Hyperlipidemia associated with type 2 diabetes mellitus (HCC)    Elevated LDL, not controlled, non adherence to statin Last lipid panel 02/2018  Plan: 1. Continue current meds - Atorvastatin 20mg  daily - RESTART 2. Encourage improved lifestyle - low carb/cholesterol, reduce portion size, continue improving regular exercise      Hypothyroidism    Controlled clinically TSH nearly normal back on levothyroxine Continue current Levothyroxine 222mcg daily      Type 2 diabetes mellitus with diabetic retinopathy ([place])    Improved DM control A1c 9.8 to 7.8 With some hyperglycemia Without Hypoglycemia Complicated by DM retinopathy - requesting eye exam report for  2019  Plan:  1. Continue current therapy - Metformin 1000mg  BID - he has restarted glimepiride 2. Encourage improved lifestyle - low carb, low sugar diet, reduce portion size, continue improving regular exercise 3. Check CBG, bring log to next visit for review - Offered PNAVax- declined - DM foot today 4. Follow-up 6 months       Other Visit Diagnoses    Annual physical exam    -  Primary   Colon cancer screening       Relevant Orders   Ambulatory referral to Gastroenterology      Updated Health Maintenance information Reviewed recent lab results with patient Encouraged improvement to lifestyle with diet and exercise - Goal of weight loss   Meds ordered this encounter  Medications  . diltiazem (MATZIM LA) 420 MG 24 hr tablet    Sig: Take 1 tablet (420 mg total) by mouth every morning.    Dispense:  90 tablet    Refill:  1    Follow up plan: Return in about 6 months (around 09/07/2018) for 6 month follow-up lab results, Diabetes, Thyroid.  [name], DO [facility] 03/09/2018, 4:08 PM

## 2018-03-09 NOTE — Patient Instructions (Addendum)
Thank you for coming to the office today.  Please schedule and return for a NURSE ONLY VISIT for VACCINE - Need Pneumovax-23 1 dose before age 52  Colonoscopy referral to GI doctors  Stay tuned for appointment.  [facility]) [address], [place] [address] Phone: [phone]  We will get a record from Dr Ellin Mayhew  Continue medicines.  DUE for FASTING BLOOD WORK (no food or drink after midnight before the lab appointment, only water or coffee without cream/sugar on the morning of)  SCHEDULE "Lab Only" visit in the morning at the clinic for lab draw in 6 MONTHS   - Make sure Lab Only appointment is at about 1 week before your next appointment, so that results will be available  For Lab Results, once available within 2-3 days of blood draw, you can can log in to MyChart online to view your results and a brief explanation. Also, we can discuss results at next follow-up visit.   Please schedule a Follow-up Appointment to: Return in about 6 months (around 09/07/2018) for 6 month follow-up lab results, Diabetes, Thyroid.  If you have any other questions or concerns, please feel free to call the office or send a message through [place]. You may also schedule an earlier appointment if necessary.  Additionally, you may be receiving a survey about your experience at our office within a few days to 1 week by e-mail or mail. We value your feedback.  [name], DO [place]

## 2018-03-11 NOTE — Assessment & Plan Note (Signed)
Elevated LDL, not controlled, non adherence to statin Last lipid panel 02/2018  Plan: 1. Continue current meds - Atorvastatin 20mg  daily - RESTART 2. Encourage improved lifestyle - low carb/cholesterol, reduce portion size, continue improving regular exercise

## 2018-03-11 NOTE — Assessment & Plan Note (Signed)
Improved DM control A1c 9.8 to 7.8 With some hyperglycemia Without Hypoglycemia Complicated by DM retinopathy - requesting eye exam report for 2019  Plan:  1. Continue current therapy - Metformin 1000mg  BID - he has restarted glimepiride 2. Encourage improved lifestyle - low carb, low sugar diet, reduce portion size, continue improving regular exercise 3. Check CBG, bring log to next visit for review - Offered PNAVax- declined - DM foot today 4. Follow-up 6 months

## 2018-03-11 NOTE — Assessment & Plan Note (Signed)
Controlled clinically TSH nearly normal back on levothyroxine Continue current Levothyroxine 223mcg daily

## 2018-03-11 NOTE — Assessment & Plan Note (Signed)
Mildly elevated initial BP, repeat manual check improved. Back on medicine - Home BP readings none  Improved creatinine    Plan:  1. Continue current BP regimen - refilled Diltiazem 420mg  daily, also continue other meds Lisinopril 20mg  and HCTZ 25mg  2. Encourage improved lifestyle - low sodium diet, regular exercise 3. Follow-up 6 months

## 2018-03-12 ENCOUNTER — Other Ambulatory Visit: Payer: Self-pay

## 2018-03-12 DIAGNOSIS — Z1211 Encounter for screening for malignant neoplasm of colon: Secondary | ICD-10-CM

## 2018-03-13 ENCOUNTER — Encounter: Payer: Self-pay | Admitting: Family Medicine

## 2018-03-13 DIAGNOSIS — E113293 Type 2 diabetes mellitus with mild nonproliferative diabetic retinopathy without macular edema, bilateral: Secondary | ICD-10-CM

## 2018-03-23 ENCOUNTER — Encounter: Payer: Self-pay | Admitting: Family Medicine

## 2018-03-23 ENCOUNTER — Ambulatory Visit (INDEPENDENT_AMBULATORY_CARE_PROVIDER_SITE_OTHER): Payer: BLUE CROSS/BLUE SHIELD | Admitting: Family Medicine

## 2018-03-23 ENCOUNTER — Emergency Department: Payer: BLUE CROSS/BLUE SHIELD

## 2018-03-23 ENCOUNTER — Inpatient Hospital Stay
Admission: EM | Admit: 2018-03-23 | Discharge: 2018-03-28 | DRG: 193 | Disposition: A | Discharge status: Home / Self Care | Payer: BLUE CROSS/BLUE SHIELD | Source: Ambulatory Visit | Attending: Internal Medicine | Admitting: Internal Medicine

## 2018-03-23 ENCOUNTER — Other Ambulatory Visit: Payer: Self-pay

## 2018-03-23 VITALS — BP 173/106 | HR 102 | Temp 98.8°F | Resp 16 | Wt 224.0 lb

## 2018-03-23 DIAGNOSIS — R0600 Dyspnea, unspecified: Secondary | ICD-10-CM

## 2018-03-23 DIAGNOSIS — Z23 Encounter for immunization: Secondary | ICD-10-CM

## 2018-03-23 DIAGNOSIS — Z79890 Hormone replacement therapy (postmenopausal): Secondary | ICD-10-CM

## 2018-03-23 DIAGNOSIS — J189 Pneumonia, unspecified organism: Secondary | ICD-10-CM

## 2018-03-23 DIAGNOSIS — I248 Other forms of acute ischemic heart disease: Secondary | ICD-10-CM | POA: Diagnosis present

## 2018-03-23 DIAGNOSIS — J42 Unspecified chronic bronchitis: Secondary | ICD-10-CM | POA: Diagnosis present

## 2018-03-23 DIAGNOSIS — E1165 Type 2 diabetes mellitus with hyperglycemia: Secondary | ICD-10-CM | POA: Diagnosis present

## 2018-03-23 DIAGNOSIS — J188 Other pneumonia, unspecified organism: Secondary | ICD-10-CM | POA: Diagnosis not present

## 2018-03-23 DIAGNOSIS — R0602 Shortness of breath: Secondary | ICD-10-CM | POA: Diagnosis not present

## 2018-03-23 DIAGNOSIS — E785 Hyperlipidemia, unspecified: Secondary | ICD-10-CM | POA: Diagnosis present

## 2018-03-23 DIAGNOSIS — J209 Acute bronchitis, unspecified: Secondary | ICD-10-CM | POA: Diagnosis present

## 2018-03-23 DIAGNOSIS — I517 Cardiomegaly: Secondary | ICD-10-CM | POA: Diagnosis not present

## 2018-03-23 DIAGNOSIS — J9801 Acute bronchospasm: Secondary | ICD-10-CM | POA: Diagnosis not present

## 2018-03-23 DIAGNOSIS — F17220 Nicotine dependence, chewing tobacco, uncomplicated: Secondary | ICD-10-CM | POA: Diagnosis present

## 2018-03-23 DIAGNOSIS — R7989 Other specified abnormal findings of blood chemistry: Secondary | ICD-10-CM

## 2018-03-23 DIAGNOSIS — R0609 Other forms of dyspnea: Secondary | ICD-10-CM

## 2018-03-23 DIAGNOSIS — I119 Hypertensive heart disease without heart failure: Secondary | ICD-10-CM | POA: Diagnosis present

## 2018-03-23 DIAGNOSIS — R918 Other nonspecific abnormal finding of lung field: Secondary | ICD-10-CM | POA: Diagnosis not present

## 2018-03-23 DIAGNOSIS — J9601 Acute respiratory failure with hypoxia: Secondary | ICD-10-CM | POA: Diagnosis present

## 2018-03-23 DIAGNOSIS — Z8249 Family history of ischemic heart disease and other diseases of the circulatory system: Secondary | ICD-10-CM | POA: Diagnosis not present

## 2018-03-23 DIAGNOSIS — E119 Type 2 diabetes mellitus without complications: Secondary | ICD-10-CM

## 2018-03-23 DIAGNOSIS — Z808 Family history of malignant neoplasm of other organs or systems: Secondary | ICD-10-CM

## 2018-03-23 DIAGNOSIS — Z833 Family history of diabetes mellitus: Secondary | ICD-10-CM

## 2018-03-23 DIAGNOSIS — E039 Hypothyroidism, unspecified: Secondary | ICD-10-CM | POA: Diagnosis present

## 2018-03-23 DIAGNOSIS — R778 Other specified abnormalities of plasma proteins: Secondary | ICD-10-CM

## 2018-03-23 DIAGNOSIS — E876 Hypokalemia: Secondary | ICD-10-CM | POA: Diagnosis present

## 2018-03-23 DIAGNOSIS — Z7984 Long term (current) use of oral hypoglycemic drugs: Secondary | ICD-10-CM

## 2018-03-23 DIAGNOSIS — I429 Cardiomyopathy, unspecified: Secondary | ICD-10-CM | POA: Diagnosis present

## 2018-03-23 DIAGNOSIS — J90 Pleural effusion, not elsewhere classified: Secondary | ICD-10-CM | POA: Diagnosis present

## 2018-03-23 DIAGNOSIS — Z1211 Encounter for screening for malignant neoplasm of colon: Secondary | ICD-10-CM

## 2018-03-23 DIAGNOSIS — Z789 Other specified health status: Secondary | ICD-10-CM

## 2018-03-23 DIAGNOSIS — I340 Nonrheumatic mitral (valve) insufficiency: Secondary | ICD-10-CM | POA: Diagnosis not present

## 2018-03-23 HISTORY — DX: Acute on chronic systolic (congestive) heart failure: I50.23

## 2018-03-23 LAB — CBC
HCT: 50.2 % (ref 39.0–52.0)
HCT: 47.7 % (ref 39.0–52.0)
Hemoglobin: 16.3 g/dL (ref 13.0–17.0)
Hemoglobin: 15.6 g/dL (ref 13.0–17.0)
MCH: 29.2 pg (ref 26.0–34.0)
MCH: 28.9 pg (ref 26.0–34.0)
MCHC: 32.5 g/dL (ref 30.0–36.0)
MCHC: 32.7 g/dL (ref 30.0–36.0)
MCV: 89.8 fL (ref 80.0–100.0)
MCV: 88.5 fL (ref 80.0–100.0)
NRBC: 0 % (ref 0.0–0.2)
Platelets: 410 K/uL — ABNORMAL HIGH (ref 150–400)
Platelets: 398 10*3/uL (ref 150–400)
RBC: 5.59 MIL/uL (ref 4.22–5.81)
RBC: 5.39 MIL/uL (ref 4.22–5.81)
RDW: 13.5 % (ref 11.5–15.5)
RDW: 13.8 % (ref 11.5–15.5)
WBC: 10.1 K/uL (ref 4.0–10.5)
WBC: 10 10*3/uL (ref 4.0–10.5)
nRBC: 0 % (ref 0.0–0.2)

## 2018-03-23 LAB — RESPIRATORY PANEL BY PCR
Adenovirus: NOT DETECTED
BORDETELLA PERTUSSIS-RVPCR: NOT DETECTED
Chlamydophila pneumoniae: NOT DETECTED
Coronavirus 229E: NOT DETECTED
Coronavirus HKU1: NOT DETECTED
Coronavirus NL63: NOT DETECTED
Coronavirus OC43: NOT DETECTED
Influenza A: NOT DETECTED
Influenza B: NOT DETECTED
Metapneumovirus: NOT DETECTED
Mycoplasma pneumoniae: NOT DETECTED
PARAINFLUENZA VIRUS 2-RVPPCR: NOT DETECTED
Parainfluenza Virus 1: NOT DETECTED
Parainfluenza Virus 3: NOT DETECTED
Parainfluenza Virus 4: NOT DETECTED
Respiratory Syncytial Virus: NOT DETECTED
Rhinovirus / Enterovirus: NOT DETECTED

## 2018-03-23 LAB — BASIC METABOLIC PANEL WITH GFR
Anion gap: 8 (ref 5–15)
BUN: 12 mg/dL (ref 6–20)
CO2: 31 mmol/L (ref 22–32)
Calcium: 8.6 mg/dL — ABNORMAL LOW (ref 8.9–10.3)
Chloride: 99 mmol/L (ref 98–111)
Creatinine, Ser: 1.08 mg/dL (ref 0.61–1.24)
GFR calc Af Amer: >60 mL/min
GFR calc non Af Amer: >60 mL/min
Glucose, Bld: 162 mg/dL — ABNORMAL HIGH (ref 70–99)
Potassium: 3.3 mmol/L — ABNORMAL LOW (ref 3.5–5.1)
Sodium: 138 mmol/L (ref 135–145)

## 2018-03-23 LAB — GLUCOSE, CAPILLARY
Glucose-Capillary: 264 mg/dL — ABNORMAL HIGH (ref 70–99)
Glucose-Capillary: 151 mg/dL — ABNORMAL HIGH (ref 70–99)

## 2018-03-23 LAB — TROPONIN I
Troponin I: 0.13 ng/mL (ref <0.03)
Troponin I: 0.12 ng/mL
Troponin I: 0.12 ng/mL (ref <0.03)

## 2018-03-23 LAB — INFLUENZA PANEL BY PCR (TYPE A & B)
INFLAPCR: NEGATIVE
Influenza B By PCR: NEGATIVE

## 2018-03-23 LAB — EXPECTORATED SPUTUM ASSESSMENT W REFEX TO RESP CULTURE

## 2018-03-23 LAB — MRSA PCR SCREENING: MRSA by PCR: NEGATIVE

## 2018-03-23 LAB — CREATININE, SERUM
Creatinine, Ser: 1 mg/dL (ref 0.61–1.24)
GFR calc Af Amer: >60 mL/min (ref >60)
GFR calc non Af Amer: >60 mL/min (ref >60)

## 2018-03-23 LAB — EXPECTORATED SPUTUM ASSESSMENT W GRAM STAIN, RFLX TO RESP C

## 2018-03-23 LAB — STREP PNEUMONIAE URINARY ANTIGEN: Strep Pneumo Urinary Antigen: NEGATIVE

## 2018-03-23 MED ORDER — BENZONATATE 100 MG PO CAPS: 100.0000 mg | ORAL_CAPSULE | Freq: Three times a day (TID) | ORAL | Status: DC | PRN

## 2018-03-23 MED ORDER — DILTIAZEM HCL ER COATED BEADS 180 MG PO CP24
180.0000 mg | ORAL_CAPSULE | ORAL | Status: DC
  Administered 2018-03-24 – 2018-03-25 (×2): 180 mg via ORAL
  Filled 2018-03-23 (×3): qty 1

## 2018-03-23 MED ORDER — VANCOMYCIN HCL IN DEXTROSE 1-5 GM/200ML-% IV SOLN
1000.0000 mg | Freq: Once | INTRAVENOUS | Status: AC
  Administered 2018-03-23: 1000 mg via INTRAVENOUS
  Filled 2018-03-23: qty 200

## 2018-03-23 MED ORDER — SODIUM CHLORIDE 0.9 % IV SOLN
2.0000 g | Freq: Three times a day (TID) | INTRAVENOUS | Status: DC
  Filled 2018-03-23 (×3): qty 2

## 2018-03-23 MED ORDER — PNEUMOCOCCAL VAC POLYVALENT 25 MCG/0.5ML IJ INJ
0.5000 mL | INJECTION | INTRAMUSCULAR | Status: AC
  Administered 2018-03-24: 09:00:00 0.5 mL via INTRAMUSCULAR
  Filled 2018-03-23: qty 0.5

## 2018-03-23 MED ORDER — IOHEXOL 350 MG/ML SOLN
75.0000 mL | Freq: Once | INTRAVENOUS | Status: AC | PRN
  Administered 2018-03-23: 75 mL via INTRAVENOUS

## 2018-03-23 MED ORDER — IPRATROPIUM-ALBUTEROL 0.5-2.5 (3) MG/3ML IN SOLN: 3.0000 mL | Freq: Once | RESPIRATORY_TRACT | Status: DC

## 2018-03-23 MED ORDER — INSULIN ASPART 100 UNIT/ML ~~LOC~~ SOLN
0.0000 [IU] | Freq: Every day | SUBCUTANEOUS | Status: DC
  Administered 2018-03-24: 23:00:00 5 [IU] via SUBCUTANEOUS
  Administered 2018-03-25: 4 [IU] via SUBCUTANEOUS
  Filled 2018-03-23 (×2): qty 1

## 2018-03-23 MED ORDER — VANCOMYCIN HCL 10 G IV SOLR
1250.0000 mg | Freq: Two times a day (BID) | INTRAVENOUS | Status: DC
  Administered 2018-03-23 – 2018-03-25 (×4): 1250 mg via INTRAVENOUS
  Filled 2018-03-23 (×6): qty 1250

## 2018-03-23 MED ORDER — INSULIN ASPART 100 UNIT/ML ~~LOC~~ SOLN
0.0000 [IU] | Freq: Three times a day (TID) | SUBCUTANEOUS | Status: DC
  Administered 2018-03-23: 17:00:00 5 [IU] via SUBCUTANEOUS
  Administered 2018-03-24: 17:00:00 2 [IU] via SUBCUTANEOUS
  Administered 2018-03-24: 1 [IU] via SUBCUTANEOUS
  Filled 2018-03-23 (×3): qty 1

## 2018-03-23 MED ORDER — INFLUENZA VAC SPLIT QUAD 0.5 ML IM SUSY
0.5000 mL | PREFILLED_SYRINGE | INTRAMUSCULAR | Status: AC
  Administered 2018-03-24: 0.5 mL via INTRAMUSCULAR
  Filled 2018-03-23: qty 0.5

## 2018-03-23 MED ORDER — ENOXAPARIN SODIUM 40 MG/0.4ML ~~LOC~~ SOLN
40.0000 mg | SUBCUTANEOUS | Status: DC
  Administered 2018-03-23 – 2018-03-24 (×2): 40 mg via SUBCUTANEOUS
  Filled 2018-03-23 (×3): qty 0.4

## 2018-03-23 MED ORDER — POTASSIUM CHLORIDE IN NACL 20-0.9 MEQ/L-% IV SOLN
INTRAVENOUS | Status: AC
  Administered 2018-03-23 – 2018-03-24 (×2): via INTRAVENOUS
  Filled 2018-03-23 (×3): qty 1000

## 2018-03-23 MED ORDER — SODIUM CHLORIDE 0.9 % IV SOLN
2.0000 g | Freq: Three times a day (TID) | INTRAVENOUS | Status: DC
  Administered 2018-03-23 – 2018-03-26 (×8): 2 g via INTRAVENOUS
  Filled 2018-03-23 (×10): qty 2

## 2018-03-23 MED ORDER — LISINOPRIL 20 MG PO TABS
20.0000 mg | ORAL_TABLET | ORAL | Status: DC
  Administered 2018-03-24 – 2018-03-25 (×2): 20 mg via ORAL
  Filled 2018-03-23 (×3): qty 1

## 2018-03-23 MED ORDER — HYDROCHLOROTHIAZIDE 25 MG PO TABS
25.0000 mg | ORAL_TABLET | Freq: Every day | ORAL | Status: DC
  Administered 2018-03-24 – 2018-03-28 (×5): 25 mg via ORAL
  Filled 2018-03-23 (×5): qty 1

## 2018-03-23 MED ORDER — SODIUM CHLORIDE 0.9 % IV SOLN
2.0000 g | Freq: Once | INTRAVENOUS | Status: AC
  Administered 2018-03-23: 2 g via INTRAVENOUS
  Filled 2018-03-23: qty 2

## 2018-03-23 MED ORDER — NICOTINE 14 MG/24HR TD PT24
14.0000 mg | MEDICATED_PATCH | Freq: Every day | TRANSDERMAL | Status: DC
  Administered 2018-03-23 – 2018-03-27 (×5): 14 mg via TRANSDERMAL
  Filled 2018-03-23 (×5): qty 1

## 2018-03-23 MED ORDER — ATORVASTATIN CALCIUM 20 MG PO TABS
20.0000 mg | ORAL_TABLET | ORAL | Status: DC
  Administered 2018-03-24 – 2018-03-25 (×2): 20 mg via ORAL
  Filled 2018-03-23 (×3): qty 1

## 2018-03-23 MED ORDER — IPRATROPIUM-ALBUTEROL 0.5-2.5 (3) MG/3ML IN SOLN
3.0000 mL | Freq: Once | RESPIRATORY_TRACT | Status: AC
  Administered 2018-03-23: 3 mL via RESPIRATORY_TRACT
  Filled 2018-03-23: qty 3

## 2018-03-23 MED ORDER — GLIMEPIRIDE 4 MG PO TABS
4.0000 mg | ORAL_TABLET | Freq: Every day | ORAL | Status: DC
  Administered 2018-03-24 – 2018-03-28 (×5): 4 mg via ORAL
  Filled 2018-03-23 (×5): qty 1

## 2018-03-23 MED ORDER — DILTIAZEM HCL ER COATED BEADS 240 MG PO CP24
240.0000 mg | ORAL_CAPSULE | ORAL | Status: DC
  Administered 2018-03-24 – 2018-03-25 (×2): 240 mg via ORAL
  Filled 2018-03-23 (×3): qty 1

## 2018-03-23 MED ORDER — ALBUTEROL SULFATE (2.5 MG/3ML) 0.083% IN NEBU: 3.0000 mL | INHALATION_SOLUTION | RESPIRATORY_TRACT | Status: DC | PRN

## 2018-03-23 MED ORDER — SODIUM CHLORIDE 0.9 % IV SOLN: 1.0000 g | Freq: Three times a day (TID) | INTRAVENOUS | Status: DC

## 2018-03-23 MED ORDER — SODIUM CHLORIDE 0.9 % IV SOLN
INTRAVENOUS | Status: DC | PRN
  Administered 2018-03-23: 23:00:00 500 mL via INTRAVENOUS
  Administered 2018-03-24 (×2): 250 mL via INTRAVENOUS
  Administered 2018-03-24: 06:00:00 500 mL via INTRAVENOUS
  Administered 2018-03-25 – 2018-03-27 (×5): 250 mL via INTRAVENOUS

## 2018-03-23 MED ORDER — LEVOTHYROXINE SODIUM 100 MCG PO TABS
200.0000 ug | ORAL_TABLET | Freq: Every day | ORAL | Status: DC
  Administered 2018-03-24 – 2018-03-28 (×5): 200 ug via ORAL
  Filled 2018-03-23 (×5): qty 2

## 2018-03-23 MED ORDER — LEVOFLOXACIN IN D5W 750 MG/150ML IV SOLN
750.0000 mg | INTRAVENOUS | Status: DC
  Administered 2018-03-23 – 2018-03-27 (×5): 750 mg via INTRAVENOUS
  Filled 2018-03-23 (×6): qty 150

## 2018-03-23 MED ORDER — ALBUTEROL SULFATE (2.5 MG/3ML) 0.083% IN NEBU: 5.0000 mg | INHALATION_SOLUTION | Freq: Once | RESPIRATORY_TRACT | Status: DC

## 2018-03-23 MED ORDER — DILTIAZEM HCL ER COATED BEADS 420 MG PO TB24: 420.0000 mg | ORAL_TABLET | ORAL | Status: DC

## 2018-03-23 NOTE — ED Provider Notes (Signed)
[facility] Emergency Department Provider Note  ____________________________________________  Time seen: Approximately 2:05 PM  I have reviewed the triage vital signs and the nursing notes.   HISTORY  Chief Complaint Shortness of Breath    HPI Shane Frazier. is a 52 y.o. male with a history of diabetes hypertension who comes to the ED today due to shortness of breath for the past month.  He has been seen several times by doctors for shortness of breath and productive cough.  He is completed courses of azithromycin and Augmentin, and currently has been on Levaquin for 5 days, not improving.  He has decreased appetite but is tolerating oral intake.  Shortness of breath is worse when lying down, he is more comfortable sitting upright.  He does wake up in the middle the night having to catch his breath.  Denies chest pain.  Shortness of breath is moderate to severe.  He denies any chronic lung disease.  No recent travel trauma hospitalization or surgery.  No history of DVT or PE.  No leg swelling or heart failure.  Past Medical History:  Diagnosis Date  . Concussion 2017   cause of seizure after hit head at work  . Diabetes mellitus without complication ([place])   . Difficult intubation    patient unaware of this diagnosis  . Hypothyroidism   . Seizures ([place]) 05-20-15   X 1-PT HIT HIS HEAD AT WORK AND THEN HAD A SEIZURE     Patient Active Problem List   Diagnosis Date Noted  . Hyperlipidemia associated with type 2 diabetes mellitus ([place]) 12/07/2017  . Hypothyroidism 12/07/2017  . Abnormal EKG 10/25/2017  . Type 2 diabetes mellitus with diabetic retinopathy ([place]) 10/25/2017  . Essential hypertension 10/04/2017  . H/O traumatic brain injury 12/22/2016  . S/P rotator cuff repair 10/13/2015  . Injury of left rotator cuff 09/22/2015  . History of seizure 05/22/2015  . Abnormal MRI of head 05/13/2015  . Migraine headache without aura 05/13/2015     Past  Surgical History:  Procedure Laterality Date  . HERNIA REPAIR     as a child,umbilical  . SHOULDER ARTHROSCOPY WITH ROTATOR CUFF REPAIR AND SUBACROMIAL DECOMPRESSION Left 09/09/2015   Procedure: SHOULDER ARTHROSCOPY, SUBACROMIAL DECOMPRESSION,BICEPS TENSON RELEASE AND MINI OPEN ROTATOR CUFF REPAIR;  Surgeon: Leanor Kail, MD;  Location: [facility] ORS;  Service: Orthopedics;  Laterality: Left;  Marland Kitchen VENTRAL HERNIA REPAIR N/A 10/27/2017   Procedure: HERNIA REPAIR VENTRAL ADULT;  Surgeon: Jules Husbands, MD;  Location: [facility] ORS;  Service: General;  Laterality: N/A;     Prior to Admission medications   Medication Sig Start Date End Date Taking? Authorizing Provider  atorvastatin (LIPITOR) 20 MG tablet Take 20 mg by mouth every morning.     [provider]  diltiazem (MATZIM LA) 420 MG 24 hr tablet Take 1 tablet (420 mg total) by mouth every morning. 03/09/18   Karamalegos, Devonne Doughty, DO  glimepiride (AMARYL) 4 MG tablet Take 4 mg by mouth daily with breakfast.    [provider]  hydrochlorothiazide (HYDRODIURIL) 25 MG tablet Take 25 mg by mouth daily.    [provider]  levothyroxine (SYNTHROID, LEVOTHROID) 200 MCG tablet Take 200 mcg by mouth daily before breakfast.     [provider]  lisinopril (PRINIVIL,ZESTRIL) 20 MG tablet Take 20 mg by mouth every morning.     [provider]  metFORMIN (GLUCOPHAGE) 1000 MG tablet Take 1,000 mg by mouth 2 (two) times daily with  a meal.    [provider]     Allergies Other   Family History  Problem Relation Age of Onset  . Hypertension Mother   . Cancer Father   . Diabetes Sister   . Thyroid cancer Sister     Social History Social History   Tobacco Use  . Smoking status: Never Smoker  . Smokeless tobacco: Current User    Types: Chew  Substance Use Topics  . Alcohol use: Yes    Comment: occasionally. rarely  . Drug use: No    Review of Systems  Constitutional:   No fever or  chills.  ENT:   No sore throat. No rhinorrhea. Cardiovascular:   No chest pain or syncope. Respiratory:   Positive shortness of breath and productive cough. Gastrointestinal:   Negative for abdominal pain, vomiting and diarrhea.  Musculoskeletal:   Negative for focal pain or swelling All other systems reviewed and are negative except as documented above in ROS and HPI.  ____________________________________________   PHYSICAL EXAM:  VITAL SIGNS: ED Triage Vitals  Enc Vitals Group     BP 03/23/18 1324 (!) 146/98     Pulse Rate 03/23/18 1324 (!) 122     Resp --      Temp --      Temp src --      SpO2 03/23/18 1223 90 %     Weight 03/23/18 1223 223 lb 15.8 oz (101.6 kg)     Height 03/23/18 1244 5\' 7"  (1.702 m)     Head Circumference --      Peak Flow --      Pain Score 03/23/18 1223 0     Pain Loc --      Pain Edu? --      Excl. in [place]? --     Vital signs reviewed, nursing assessments reviewed.   Constitutional:   Alert and oriented.  Ill-appearing. Eyes:   Conjunctivae are normal. EOMI. PERRL. ENT      Head:   Normocephalic and atraumatic.      Nose:   No congestion/rhinnorhea.       Mouth/Throat:   MMM, no pharyngeal erythema. No peritonsillar mass.       Neck:   No meningismus. Full ROM. Hematological/Lymphatic/Immunilogical:   No cervical lymphadenopathy. Cardiovascular:   Tachycardia heart rate 110. Symmetric bilateral radial and DP pulses.  No murmurs. Cap refill less than 2 seconds. Respiratory:   Normal respiratory effort without tachypnea/retractions. Breath sounds are symmetric bilaterally, although slightly diminished on the right.. No wheezes/rales/rhonchi. Gastrointestinal:   Soft and nontender. Non distended. There is no CVA tenderness.  No rebound, rigidity, or guarding. Musculoskeletal:   Normal range of motion in all extremities. No joint effusions.  No lower extremity tenderness.  No edema. Neurologic:   Normal speech and language.  Motor grossly  intact. No acute focal neurologic deficits are appreciated.  Skin:    Skin is warm, dry and intact. No rash noted.  No petechiae, purpura, or bullae.  ____________________________________________    LABS (pertinent positives/negatives) (all labs ordered are listed, but only abnormal results are displayed) Labs Reviewed  BASIC METABOLIC PANEL - Abnormal; Notable for the following components:      Result Value   Potassium 3.3 (*)    Glucose, Bld 162 (*)    Calcium 8.6 (*)    All other components within normal limits  CBC - Abnormal; Notable for the following components:   Platelets 410 (*)    All  other components within normal limits  TROPONIN I - Abnormal; Notable for the following components:   Troponin I 0.13 (*)    All other components within normal limits   ____________________________________________   EKG  Interpreted by me Sinus tachycardia rate 106, left axis, normal intervals.  Poor R wave progression.  Normal ST segments and T waves.  No acute ischemic changes.  ____________________________________________    RADIOLOGY  Dg Chest 2 View  Result Date: 03/23/2018 CLINICAL DATA:  Shortness of breath. EXAM: CHEST - 2 VIEW COMPARISON:  Radiographs of March 02, 2018. FINDINGS: The heart size and mediastinal contours are within normal limits. No pneumothorax or pleural effusion is noted. There is interval development of diffuse right lung opacity as well as focal lingular opacity most consistent with multifocal pneumonia. The visualized skeletal structures are unremarkable. IMPRESSION: Interval development of bilateral pneumonia, with right much larger than left. Electronically Signed   By: Marijo Conception, M.D.   On: 03/23/2018 12:59   Ct Angio Chest Pe W And/or Wo Contrast  Result Date: 03/23/2018 CLINICAL DATA:  Increased shortness of breath for the past 3 weeks. Diagnosed with pneumonia 3 weeks ago. EXAM: CT ANGIOGRAPHY CHEST WITH CONTRAST TECHNIQUE: Multidetector CT  imaging of the chest was performed using the standard protocol during bolus administration of intravenous contrast. Multiplanar CT image reconstructions and MIPs were obtained to evaluate the vascular anatomy. CONTRAST:  49mL OMNIPAQUE IOHEXOL 350 MG/ML SOLN COMPARISON:  Chest radiographs obtained earlier today and on 03/02/2018. FINDINGS: Cardiovascular: Mildly enlarged heart. Normally opacified pulmonary arteries with no pulmonary arterial filling defects seen. Mediastinum/Nodes: Small hiatal hernia. Mildly enlarged proximal right hilar and subcarinal lymph nodes. The largest has a short axis diameter of 12 mm on image number 35 series 4. Lungs/Pleura: Small to moderate-sized right pleural effusion. Marked patchy opacity throughout the majority of the right lung. Similar opacities in the left lung. These are involving all the lobes of both lungs. Upper Abdomen: Unremarkable. Musculoskeletal: Thoracic and cervicothoracic spine degenerative changes. Review of the MIP images confirms the above findings. IMPRESSION: 1. No pulmonary emboli. 2. Extensive bilateral multilobar pneumonia, greater on the right. 3. Small to moderate-sized right pleural effusion. 4. Mild mediastinal and right hilar adenopathy, most likely reactive. 5. Small hiatal hernia. Electronically Signed   By: Claudie Revering M.D.   On: 03/23/2018 13:48    ____________________________________________   PROCEDURES Procedures  ____________________________________________  DIFFERENTIAL DIAGNOSIS   Pneumonia, bronchitis, pulmonary embolism, pulmonary edema/congestive heart failure, pneumothorax  CLINICAL IMPRESSION / ASSESSMENT AND PLAN / ED COURSE  Pertinent labs & imaging results that were available during my care of the patient were reviewed by me and considered in my medical decision making (see chart for details).    Patient presents with persistent shortness of breath and cough despite 3 courses of antibiotics for pneumonia.  Repeat  chest x-ray today shows diffuse infiltrates throughout the right lung as well as in the middle and lower lung fields of the left lung.  Most likely a multifocal pneumonia and outpatient treatment failure.  However with his multiple courses of antibiotics, I obtained a CT scan of the chest to rule out PE versus pulmonary edema, which overall was unremarkable and confirms that the findings are most compatible with pneumonia.  Start the patient on cefepime and vancomycin and plan to admit.      ____________________________________________   FINAL CLINICAL IMPRESSION(S) / ED DIAGNOSES    Final diagnoses:  Elevated troponin  Multifocal pneumonia  Failure of  outpatient treatment  Type 2 diabetes mellitus without complication, unspecified whether long term insulin use ([place])  Acute respiratory failure with hypoxia   ED Discharge Orders    None      Portions of this note were generated with dragon dictation software. Dictation errors may occur despite best attempts at proofreading.    Carrie Mew, MD 03/23/18 [phone]

## 2018-03-23 NOTE — ED Notes (Signed)
Patient transported to CT 

## 2018-03-23 NOTE — Patient Instructions (Addendum)
Thank you for coming to the office today.  Please go directly to [facility] ED for evaluation - I am concerned with lung sounds and persistent symptoms - you have a low measured oxygen here 88 to 89%, be cautious walking this may reduce further  May  Need imaging such as CT Scan next  Please schedule a Follow-up Appointment to: Return in about 1 week (around 03/30/2018), or if symptoms worsen or fail to improve, for dyspnea.  If you have any other questions or concerns, please feel free to call the office or send a message through [place]. You may also schedule an earlier appointment if necessary.  Additionally, you may be receiving a survey about your experience at our office within a few days to 1 week by e-mail or mail. We value your feedback.  [name], DO [place]

## 2018-03-23 NOTE — H&P (Signed)
[place] [facility] [place] NAME: Shane Frazier    MR#:  [id number]  DATE OF BIRTH:  03-25-1966  DATE OF ADMISSION:  03/23/2018  PRIMARY CARE PHYSICIAN: Olin Hauser, DO   REQUESTING/REFERRING PHYSICIAN: Carrie Mew, MD  CHIEF COMPLAINT:  Shortness of breath  HISTORY OF PRESENT ILLNESS:  Shane Frazier  is a 52 y.o. male with a known history of diabetes mellitus, hypertension, hypothyroidism, hyperlipidemia, history of seizures and other medical problems is presenting to the ED with a chief complaint of shortness of breath for the past 1 month.  Patient was seen by his primary care physician several times in the past 1 month.  Patient completed course of azithromycin and Augmentin initially but as he was not improving clinically he was given levofloxacin which he has completed for 5 days with no improvement and came into the ED.  CT chest with no pulmonary embolism but has revealed bilateral multilobar pneumonia with right-sided small-to-moderate sized pleural effusion.  Troponin is elevated 0.13. Patient is started on cefepime and vancomycin and hospitalist team is called to admit the patient.  Patient's fianc and daughter are at bedside  PAST MEDICAL HISTORY:   Past Medical History:  Diagnosis Date  . Concussion 2017   cause of seizure after hit head at work  . Diabetes mellitus without complication ([place])   . Difficult intubation    patient unaware of this diagnosis  . Hypothyroidism   . Seizures ([place]) 05-20-15   X 1-PT HIT HIS HEAD AT WORK AND THEN HAD A SEIZURE    PAST SURGICAL HISTOIRY:   Past Surgical History:  Procedure Laterality Date  . HERNIA REPAIR     as a child,umbilical  . SHOULDER ARTHROSCOPY WITH ROTATOR CUFF REPAIR AND SUBACROMIAL DECOMPRESSION Left 09/09/2015   Procedure: SHOULDER ARTHROSCOPY, SUBACROMIAL DECOMPRESSION,BICEPS TENSON RELEASE AND MINI OPEN ROTATOR CUFF REPAIR;  Surgeon: Leanor Kail, MD;   Location: [facility] ORS;  Service: Orthopedics;  Laterality: Left;  Marland Kitchen VENTRAL HERNIA REPAIR N/A 10/27/2017   Procedure: HERNIA REPAIR VENTRAL ADULT;  Surgeon: Jules Husbands, MD;  Location: [facility] ORS;  Service: General;  Laterality: N/A;    SOCIAL HISTORY:   Social History   Tobacco Use  . Smoking status: Never Smoker  . Smokeless tobacco: Current User    Types: Chew  Substance Use Topics  . Alcohol use: Yes    Comment: occasionally. rarely    FAMILY HISTORY:   Family History  Problem Relation Age of Onset  . Hypertension Mother   . Cancer Father   . Diabetes Sister   . Thyroid cancer Sister     DRUG ALLERGIES:   Allergies  Allergen Reactions  . Other Other (See Comments)    BRAZILIAN NUTS ONLY-"THROAT CLOSES UP"    REVIEW OF SYSTEMS:  CONSTITUTIONAL: No fever, fatigue or weakness.  EYES: No blurred or double vision.  EARS, NOSE, AND THROAT: No tinnitus or ear pain.  RESPIRATORY: Patient reports cough, shortness of breath which has been getting worse for a month denies wheezing or hemoptysis.  CARDIOVASCULAR: No chest pain, orthopnea, edema.  GASTROINTESTINAL: No nausea, vomiting, diarrhea or abdominal pain.  GENITOURINARY: No dysuria, hematuria.  ENDOCRINE: No polyuria, nocturia,  HEMATOLOGY: No anemia, easy bruising or bleeding SKIN: No rash or lesion. MUSCULOSKELETAL: No joint pain or arthritis.   NEUROLOGIC: No tingling, numbness, weakness.  PSYCHIATRY: No anxiety or depression.   MEDICATIONS AT HOME:   Prior to Admission medications   Medication  Sig Start Date End Date Taking? Authorizing Provider  atorvastatin (LIPITOR) 20 MG tablet Take 20 mg by mouth every morning.    Yes [provider]  benzonatate (TESSALON) 100 MG capsule Take 1-2 capsules by mouth 3 (three) times daily as needed. 03/16/18  Yes [provider]  diltiazem (MATZIM LA) 420 MG 24 hr tablet Take 1 tablet (420 mg total) by mouth every morning. 03/09/18  Yes Karamalegos,  Devonne Doughty, DO  glimepiride (AMARYL) 4 MG tablet Take 4 mg by mouth daily with breakfast.   Yes [provider]  levofloxacin (LEVAQUIN) 750 MG tablet Take 750 mg by mouth daily. 03/16/18  Yes [provider]  levothyroxine (SYNTHROID, LEVOTHROID) 200 MCG tablet Take 200 mcg by mouth daily before breakfast.    Yes [provider]  lisinopril (PRINIVIL,ZESTRIL) 20 MG tablet Take 20 mg by mouth every morning.    Yes [provider]  metFORMIN (GLUCOPHAGE) 500 MG tablet Take 500 mg by mouth 2 (two) times daily with a meal.    Yes [provider]  PROAIR HFA 108 (90 Base) MCG/ACT inhaler Inhale 2 puffs into the lungs daily as needed. 03/16/18  Yes [provider]  hydrochlorothiazide (HYDRODIURIL) 25 MG tablet Take 25 mg by mouth daily.    [provider]      VITAL SIGNS:  Blood pressure (!) 146/98, pulse (!) 111, resp. rate 18, height 5\' 7"  (1.702 m), weight 101.6 kg, SpO2 92 %.  PHYSICAL EXAMINATION:  GENERAL:  53 y.o.-year-old patient lying in the bed with no acute distress.  EYES: Pupils equal, round, reactive to light and accommodation. No scleral icterus. Extraocular muscles intact.  HEENT: Head atraumatic, normocephalic. Oropharynx and nasopharynx clear.  NECK:  Supple, no jugular venous distention. No thyroid enlargement, no tenderness.  LUNGS: Moderate breath sounds bilaterally, with a diffuse crepitations no wheezing, rales,rhonchi  No use of accessory muscles of respiration.  CARDIOVASCULAR: S1, S2 normal. No murmurs, rubs, or gallops.  ABDOMEN: Soft, nontender, nondistended. Bowel sounds present.  EXTREMITIES: No pedal edema, cyanosis, or clubbing.  NEUROLOGIC: Awake, alert and oriented x3 sensation intact. Gait not checked.  PSYCHIATRIC: The patient is alert and oriented x 3.  SKIN: No obvious rash, lesion, or ulcer.   LABORATORY PANEL:   CBC Recent Labs  Lab 03/23/18 1239  WBC 10.1  HGB 16.3  HCT 50.2  PLT  410*   ------------------------------------------------------------------------------------------------------------------  Chemistries  Recent Labs  Lab 03/23/18 1239  NA 138  K 3.3*  CL 99  CO2 31  GLUCOSE 162*  BUN 12  CREATININE 1.08  CALCIUM 8.6*   ------------------------------------------------------------------------------------------------------------------  Cardiac Enzymes Recent Labs  Lab 03/23/18 1239  TROPONINI 0.13*   ------------------------------------------------------------------------------------------------------------------  RADIOLOGY:  Dg Chest 2 View  Result Date: 03/23/2018 CLINICAL DATA:  Shortness of breath. EXAM: CHEST - 2 VIEW COMPARISON:  Radiographs of March 02, 2018. FINDINGS: The heart size and mediastinal contours are within normal limits. No pneumothorax or pleural effusion is noted. There is interval development of diffuse right lung opacity as well as focal lingular opacity most consistent with multifocal pneumonia. The visualized skeletal structures are unremarkable. IMPRESSION: Interval development of bilateral pneumonia, with right much larger than left. Electronically Signed   By: Marijo Conception, M.D.   On: 03/23/2018 12:59   Ct Angio Chest Pe W And/or Wo Contrast  Result Date: 03/23/2018 CLINICAL DATA:  Increased shortness of breath for the past 3 weeks. Diagnosed with pneumonia 3 weeks ago. EXAM: CT ANGIOGRAPHY  CHEST WITH CONTRAST TECHNIQUE: Multidetector CT imaging of the chest was performed using the standard protocol during bolus administration of intravenous contrast. Multiplanar CT image reconstructions and MIPs were obtained to evaluate the vascular anatomy. CONTRAST:  49mL OMNIPAQUE IOHEXOL 350 MG/ML SOLN COMPARISON:  Chest radiographs obtained earlier today and on 03/02/2018. FINDINGS: Cardiovascular: Mildly enlarged heart. Normally opacified pulmonary arteries with no pulmonary arterial filling defects seen. Mediastinum/Nodes:  Small hiatal hernia. Mildly enlarged proximal right hilar and subcarinal lymph nodes. The largest has a short axis diameter of 12 mm on image number 35 series 4. Lungs/Pleura: Small to moderate-sized right pleural effusion. Marked patchy opacity throughout the majority of the right lung. Similar opacities in the left lung. These are involving all the lobes of both lungs. Upper Abdomen: Unremarkable. Musculoskeletal: Thoracic and cervicothoracic spine degenerative changes. Review of the MIP images confirms the above findings. IMPRESSION: 1. No pulmonary emboli. 2. Extensive bilateral multilobar pneumonia, greater on the right. 3. Small to moderate-sized right pleural effusion. 4. Mild mediastinal and right hilar adenopathy, most likely reactive. 5. Small hiatal hernia. Electronically Signed   By: Claudie Revering M.D.   On: 03/23/2018 13:48    EKG:   Orders placed or performed during the hospital encounter of 03/23/18  . ED EKG  . ED EKG  . EKG 12-Lead  . EKG 12-Lead    IMPRESSION AND PLAN:   Shane Frazier  is a 52 y.o. male with a known history of diabetes mellitus, hypertension, hypothyroidism, hyperlipidemia, history of seizures and other medical problems is presenting to the ED with a chief complaint of shortness of breath for the past 1 month.  Patient was seen by his primary care physician several times in the past 1 month.  Patient completed course of azithromycin and Augmentin initially but as he was not improving clinically he was given levofloxacin which he has completed for 5 days with no improvement and came into the ED   #Acute hypoxic respiratory failure secondary to healthcare associated pneumonia Admit to MedSurg unit Broad-spectrum IV antibiotics Oxygen via nasal cannula and wean off as tolerated Broad-spectrum IV antibiotics  #Healthcare associated pneumonia failed outpatient antibiotics Patient failed on azithromycin with Augmentin followed by levofloxacin as an outpatient Started  cefepime and vancomycin Sputum culture and sensitivity Check for influenza, respiratory viral panel, adenovirus, urine for Legionella antigen Bronchodilator treatment ID consult placed CT scan with bilateral multilobar pneumonia with right-sided small to moderate-sized pleural effusion.  Will repeat chest x-ray in 2 days if no improvement to consider right-sided thoracentesis  #Elevated troponin 0 0.13-probably demand ischemia Patient denies any chest pain Cycle troponins and monitor patient on telemetry  #Hypokalemia replete and recheck in a.m. Potassium at 3.3  #Diabetes mellitus Hold metformin and continue Amaryl Sliding scale insulin  #Hypothyroidism continue Synthyroid  #Hyperlipidemia continue statin  #History of migraine headaches continue Fioricet  #Tobacco abuse disorder Patient admits sniffing tobacco.  Counseled patient to quit sniffing for 5 minutes.  Patient verbalized understanding will provide nicotine patch for his cravings  All the records are reviewed and case discussed with ED provider. Management plans discussed with the patient, family and they are in agreement.  CODE STATUS: fc   TOTAL TIME TAKING CARE OF THIS PATIENT: 45 minutes.   Note: This dictation was prepared with Dragon dictation along with smaller phrase technology. Any transcriptional errors that result from this process are unintentional.  Nicholes Mango M.D on 03/23/2018 at 3:23 PM  Between 7am to 6pm - Pager - [phone]  After 6pm go to [email or web address] - password [email or web address] [place] [facility]  Office  [phone]  CC: Primary care physician; Olin Hauser, DO

## 2018-03-23 NOTE — ED Notes (Signed)
Patient transported to X-ray 

## 2018-03-23 NOTE — ED Triage Notes (Signed)
Pt presents today fron Urgent care for [facility]. Pt was recently dx with PNA. Pt received breathen tx there

## 2018-03-23 NOTE — Progress Notes (Signed)
Subjective:    Patient ID: Shane Picket., male    DOB: [birth year], 52 y.o.   MRN: [id number]  Shane Dilworth. is a 52 y.o. male presenting on 03/23/2018 for Pneumonia and Shortness of Breath  Patient presents for a same day appointment.  HPI   DYSPNEA on EXERTION / FOLLOW-UP PNEUMONIA / PERSISTENT COUGH Recent course for same illness, seen on 11/15 for respiratory complaint - see note, he was diagnosed and treated RLL PNA - given antibiotics Augmentin / Azithromycin orally. He improved and was seen on 03/09/18 for regular check up he was not having significant respiratory complaints at that time, he states overall was up to 70% improved. - Interval update since that time, he states that he does work outside in cold weather and thinks he may have gotten sick again or never recovered. He went to [facility] on approx 03/18/18 about 1 week ago or less, and they diagnosed him with pneumonia and treated with Levaquin antibiotic for 7 days. - Now today he arrives for sick visit, and states he never improved off last antibiotic - He complains of dyspnea and shortness of breath with exertion even shorter distances, improved at rest and feels okay, he states that he does not feel "bad" overall but has difficulty catching breath and with coughing spells - Not tried any other medicines for cough OTC or in interval - No known history of COPD or Asthma. He is a never smoker. - Admits coughing  - Denies any edema in lower extremity, hemoptysis, chest pain or pressure, recurrent fevers, chills   Depression screen [facility] 2/9 03/23/2018 03/09/2018 12/06/2017  Decreased Interest 0 0 0  Down, Depressed, Hopeless 0 0 0  PHQ - 2 Score 0 0 0    Social History   Tobacco Use  . Smoking status: Never Smoker  . Smokeless tobacco: Current User    Types: Chew  Substance Use Topics  . Alcohol use: Yes    Comment: occasionally. rarely  . Drug use: No    Review of Systems Per HPI unless specifically  indicated above     Objective:    BP (!) 173/106   Pulse (!) 102   Temp 98.8 F (37.1 C) (Oral)   Resp 16   Wt 224 lb (101.6 kg)   SpO2 (!) 89%   BMI 35.08 kg/m   Wt Readings from Last 3 Encounters:  03/23/18 224 lb (101.6 kg)  03/09/18 220 lb 12.8 oz (100.2 kg)  03/02/18 215 lb (97.5 kg)    Physical Exam  Constitutional: He is oriented to person, place, and time. He appears well-developed and well-nourished. No distress.  Sick appearing but non toxic, tired, cooperative  HENT:  Head: Normocephalic and atraumatic.  Mouth/Throat: Oropharynx is clear and moist.  Eyes: Conjunctivae are normal. Right eye exhibits no discharge. Left eye exhibits no discharge.  Neck: Normal range of motion. Neck supple.  Cardiovascular: Regular rhythm, normal heart sounds and intact distal pulses.  tachycardic  Pulmonary/Chest: No respiratory distress. He has decreased breath sounds. He has no wheezes. He has rhonchi. He has rales (generalized).  Increased respiratory effort, but able to speak full sentences. Frequent coughing spell  Attempted Duoneb treatment - unable to tolerate - provoked coughing spell.  Musculoskeletal: Normal range of motion. He exhibits no edema.  Neurological: He is alert and oriented to person, place, and time.  Skin: Skin is warm and dry. No rash noted. He is not diaphoretic. No erythema.  Psychiatric: He has a normal mood and affect. His behavior is normal.  Well groomed, good eye contact, normal speech and thoughts  Nursing note and vitals reviewed.        Assessment & Plan:   Problem List Items Addressed This Visit    None    Visit Diagnoses    Dyspnea on exertion    -  Primary   Cough due to bronchospasm          Dyspnea mostly exertional, with hypoxia to 88-89% - limited improved in past 4 weeks from prior RLL PNA identified 1 month ago, s/p multiple rounds of antibiotics Augmentin, Azithromycin, Levaquin most recently. - ?Failure of oral antibiotic for  CAP - Tachycardic, afebrile, O2 sat 88-93% - Lung sounds are coarse with rales/rhonchi, without edema or volume overload clinically - ultimately I think he may benefit from advanced imaging such as CT - may need repeat CXR at least  Plan: 1. Attempted Duoneb - considered some bronchospasm limiting his breathing currently - may benefit from albuterol, steroids but given clinical evaluation and severity of his symptoms - I would recommend he be treated promptly at ED - [facility] ED in triage, notified them of patient, and he opts to have family pick him up to take him to hospital ED he is able to catch his breath at rest  - Hold further antibiotics or other rx at this time - until further work-up at hospital  Strict follow-up criteria given   Meds ordered this encounter  Medications  . DISCONTD: ipratropium-albuterol (DUONEB) 0.5-2.5 (3) MG/3ML nebulizer solution 3 mL     Follow up plan: Return in about 1 week (around 03/30/2018), or if symptoms worsen or fail to improve, for dyspnea.  [name], [facility] 03/23/2018, 11:38 AM

## 2018-03-23 NOTE — Consult Note (Signed)
Infectious Disease     Reason for Consult: Multifocal pneumonia   Referring Physician: Dr. Margaretmary Eddy Date of Admission:  03/23/2018   Active Problems:   HCAP (healthcare-associated pneumonia)   HPI: Shane Frazier. is a 52 y.o. male with a past medical history of diabetes hypothyroidism and seizure history who has been progressively ill over the last 1 month with respiratory symptoms.  He was initially seen by his primary care and treated with Augmentin and azithromycin for respiratory illness with a chest x-ray showing mild right lower lobe infiltrate on November 15.  He apparently improved however became ill again and was seen in urgent care center approximately 1 week ago and was started on levofloxacin.  This has not improved his illness and he presented to his primary care today with shortness of breath and cough.   He is a lifetime non-smoker but does dip tobacco. 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  No recent travel.  He does report that he has lost about 40 pounds in the last year or so but that has been multifactorial with poorly controlled diabetes, not taking his thyroid medications and having lost his wife a year ago developing some depression.  He denies night sweats or fevers.  He has been started on ceftaz and vancomycin.  Past Medical History:  Diagnosis Date  . Concussion 2017   cause of seizure after hit head at work  . Diabetes mellitus without complication ([place])   . Difficult intubation    patient unaware of this diagnosis  . Hypothyroidism   . Seizures ([place]) 05-20-15   X 1-PT HIT HIS HEAD AT WORK AND THEN HAD A SEIZURE   Past Surgical History:  Procedure Laterality Date  . HERNIA REPAIR     as a child,umbilical  . SHOULDER ARTHROSCOPY WITH ROTATOR CUFF REPAIR AND SUBACROMIAL DECOMPRESSION Left 09/09/2015   Procedure: SHOULDER ARTHROSCOPY, SUBACROMIAL DECOMPRESSION,BICEPS TENSON RELEASE  AND MINI OPEN ROTATOR CUFF REPAIR;  Surgeon: Leanor Kail, MD;  Location: [facility] ORS;  Service: Orthopedics;  Laterality: Left;  Marland Kitchen VENTRAL HERNIA REPAIR N/A 10/27/2017   Procedure: HERNIA REPAIR VENTRAL ADULT;  Surgeon: Jules Husbands, MD;  Location: [facility] ORS;  Service: General;  Laterality: N/A;   Social History   Tobacco Use  . Smoking status: Never Smoker  . Smokeless tobacco: Current User    Types: Chew  Substance Use Topics  . Alcohol use: Yes    Comment: occasionally. rarely  . Drug use: No   Family History  Problem Relation Age of Onset  . Hypertension Mother   . Cancer Father   . Diabetes Sister   . Thyroid cancer Sister     Allergies:  Allergies  Allergen Reactions  . Other Other (See Comments)    BRAZILIAN NUTS ONLY-"THROAT CLOSES UP"    Current antibiotics: Antibiotics Given (last 72 hours)    Date/Time Action Medication Dose Rate   03/23/18 1409 New Bag/Given   ceFEPIme (MAXIPIME) 2 g in sodium chloride 0.9 % 100 mL IVPB 2 g 200 mL/hr   03/23/18 1443 New Bag/Given   vancomycin (VANCOCIN) IVPB 1000 mg/200 mL premix 1,000 mg 200 mL/hr      MEDICATIONS: . insulin aspart  0-5 Units Subcutaneous QHS  . insulin aspart  0-9 Units Subcutaneous TID WC  . nicotine  14 mg Transdermal Daily    Review of Systems - 11 systems reviewed and negative  per HPI   OBJECTIVE: Temp:  [98.8 F (37.1 C)] 98.8 F (37.1 C) (12/06 1134) Pulse Rate:  [102-122] 111 (12/06 1430) Resp:  [16-18] 18 (12/06 1430) BP: (146-173)/(98-106) 146/98 (12/06 1430) SpO2:  [88 %-97 %] 92 % (12/06 1430) Weight:  [101.6 kg] 101.6 kg (12/06 1244) Physical Exam  Constitutional: He is oriented to person, place, and time. He appears well-developed and well-nourished. No distress.  HENT: Anicteric Mouth/Throat: Oropharynx is clear and moist. No oropharyngeal exudate.  Cardiovascular: Normal rate, regular rhythm and normal heart sounds. Exam reveals no gallop and no friction rub.   Pulmonary/Chest: Decent air movement.  Bilateral rhonchi.  No wheeze.   Abdominal: Soft. Bowel sounds are normal. He exhibits no distension. There is no tenderness.  Lymphadenopathy: He has no cervical adenopathy.  Neurological: He is alert and oriented to person, place, and time.  Skin: Skin is warm and dry. No rash noted. No erythema.  Psychiatric: He has a normal mood and affect. His behavior is normal.  Extremities-no clubbing LABS: Results for orders placed or performed during the hospital encounter of 03/23/18 (from the past 48 hour(s))  Basic metabolic panel     Status: Abnormal   Collection Time: 03/23/18 12:39 PM  Result Value Ref Range   Sodium 138 135 - 145 mmol/L   Potassium 3.3 (L) 3.5 - 5.1 mmol/L   Chloride 99 98 - 111 mmol/L   CO2 31 22 - 32 mmol/L   Glucose, Bld 162 (H) 70 - 99 mg/dL   BUN 12 6 - 20 mg/dL   Creatinine, Ser 1.08 0.61 - 1.24 mg/dL   Calcium 8.6 (L) 8.9 - 10.3 mg/dL   GFR calc non Af Amer >60 >60 mL/min   GFR calc Af Amer >60 >60 mL/min   Anion gap 8 5 - 15    Comment: Performed at [facility], [address] [place], [place] [address]  CBC     Status: Abnormal   Collection Time: 03/23/18 12:39 PM  Result Value Ref Range   WBC 10.1 4.0 - 10.5 K/uL   RBC 5.59 4.22 - 5.81 MIL/uL   Hemoglobin 16.3 13.0 - 17.0 g/dL   HCT 50.2 39.0 - 52.0 %   MCV 89.8 80.0 - 100.0 fL   MCH 29.2 26.0 - 34.0 pg   MCHC 32.5 30.0 - 36.0 g/dL   RDW 13.5 11.5 - 15.5 %   Platelets 410 (H) 150 - 400 K/uL   nRBC 0.0 0.0 - 0.2 %    Comment: Performed at [facility], [address] [place], [place] [address]  Troponin I - Add-On to previous collection     Status: Abnormal   Collection Time: 03/23/18 12:39 PM  Result Value Ref Range   Troponin I 0.13 (HH) <0.03 ng/mL    Comment: CRITICAL RESULT CALLED TO, READ BACK BY AND VERIFIED WITH GRACIE [facility] @1320  03/23/18 BY FMW Performed at [facility], [address] [place], [place]  [address]    No components found for: ESR, C REACTIVE PROTEIN MICRO: No results found for this or any previous visit (from the past 720 hour(s)).  IMAGING: Dg Chest 2 View  Result Date: 03/23/2018 CLINICAL DATA:  Shortness of breath. EXAM: CHEST - 2 VIEW COMPARISON:  Radiographs of March 02, 2018. FINDINGS: The heart size and mediastinal contours are within normal limits. No pneumothorax or pleural effusion is noted. There is interval development of diffuse right lung opacity as well as focal lingular opacity most  consistent with multifocal pneumonia. The visualized skeletal structures are unremarkable. IMPRESSION: Interval development of bilateral pneumonia, with right much larger than left. Electronically Signed   By: Marijo Conception, M.D.   On: 03/23/2018 12:59   Dg Chest 2 View  Result Date: 03/02/2018 CLINICAL DATA:  Cough and dyspnea EXAM: CHEST - 2 VIEW COMPARISON:  None. FINDINGS: Cardiac shadow is at the upper limits of normal in size. The lungs are well aerated bilaterally. Some mild right lower lobe infiltrate is seen posteriorly. No other focal infiltrate is seen. No effusion is noted. No bony abnormality is seen. IMPRESSION: Mild right lower lobe infiltrate. Electronically Signed   By: Inez Catalina M.D.   On: 03/02/2018 13:55   Ct Angio Chest Pe W And/or Wo Contrast  Result Date: 03/23/2018 CLINICAL DATA:  Increased shortness of breath for the past 3 weeks. Diagnosed with pneumonia 3 weeks ago. EXAM: CT ANGIOGRAPHY CHEST WITH CONTRAST TECHNIQUE: Multidetector CT imaging of the chest was performed using the standard protocol during bolus administration of intravenous contrast. Multiplanar CT image reconstructions and MIPs were obtained to evaluate the vascular anatomy. CONTRAST:  85m OMNIPAQUE IOHEXOL 350 MG/ML SOLN COMPARISON:  Chest radiographs obtained earlier today and on 03/02/2018. FINDINGS: Cardiovascular: Mildly enlarged heart. Normally opacified pulmonary arteries with no  pulmonary arterial filling defects seen. Mediastinum/Nodes: Small hiatal hernia. Mildly enlarged proximal right hilar and subcarinal lymph nodes. The largest has a short axis diameter of 12 mm on image number 35 series 4. Lungs/Pleura: Small to moderate-sized right pleural effusion. Marked patchy opacity throughout the majority of the right lung. Similar opacities in the left lung. These are involving all the lobes of both lungs. Upper Abdomen: Unremarkable. Musculoskeletal: Thoracic and cervicothoracic spine degenerative changes. Review of the MIP images confirms the above findings. IMPRESSION: 1. No pulmonary emboli. 2. Extensive bilateral multilobar pneumonia, greater on the right. 3. Small to moderate-sized right pleural effusion. 4. Mild mediastinal and right hilar adenopathy, most likely reactive. 5. Small hiatal hernia. Electronically Signed   By: SClaudie ReveringM.D.   On: 03/23/2018 13:48    Assessment:   JOnofre Gains is a 52y.o. male with a history of poorly controlled diabetes as well as hypothyroidism admitted with multifocal pneumonia following an illness over the last 3-4weeks.  The initial illness was ongoing for 1 week with a cough prior to being seen and having a chest x-ray showing a small right infiltrate.  This was treated with Augmentin and azithromycin with some improvement but not complete resolution.  He is also not been on levofloxacin for 5 days.  His CT is quite impressive with bilateral multifocal infiltrates.  However surprisingly he appears relatively well.  He has no known unusual exposures or travel history.  He had been well prior to this illness however does report multifactorial weight loss over the last year of about 40 pounds but reports he is gaining weight again.  Recommendations Check respiratory viral panel PCR as well as influenza PCR. Check urine Legionella and strep pneumonia antigen. Send sputum culture if productive of sputum. HIV test pending. Continue  vancomycin and cefepime.   Would add levofloxacin as well for Legionella coverage. Thank you very much for allowing me to participate in the care of this patient. Please call with questions.   DCheral Marker FOla Spurr MD

## 2018-03-23 NOTE — Consult Note (Signed)
Pharmacy Antibiotic Note  Shane Frazier. is a 52 y.o. male admitted on 03/23/2018 with pneumonia.  Patient was treated with Augmentin + Azithromycin outpatient. Recently was also treated with Levofloxacin outpatient. Patient has bilateral multifocal infiltrates. Pharmacy has been consulted for Levofloxacin dosing.  Plan: Ordered Levofloxacin 750 mg IV daily for double coverage and for legionella coverage per ID.  Continue Cefepime 2 g IV q8h. Ordered Vancomycin 1250 mg IV q12h starting @ 2000. Will order trough prior to 4th dose on 12/7 @ 1930. Goal trough of 15-20.  Vancomycin Kinetics Using adj BW 80.3 kg and CrCl 91.9 mL/min ke 0.081 Vd 56.2  T1/2 8.6 hrs  Height: 5\' 7"  (170.2 cm) Weight: 223 lb 15.8 oz (101.6 kg) IBW/kg (Calculated) : 66.1  Temp (24hrs), Avg:98.5 F (36.9 C), Min:98.1 F (36.7 C), Max:98.8 F (37.1 C)  Recent Labs  Lab 03/23/18 1239  WBC 10.1  CREATININE 1.08    Estimated Creatinine Clearance: 91.9 mL/min (by C-G formula based on SCr of 1.08 mg/dL).    Allergies  Allergen Reactions  . Other Other (See Comments)    BRAZILIAN NUTS ONLY-"THROAT CLOSES UP"    Antimicrobials this admission: 12/6 Cefepime >>  12/6 Vancomycin >>  12/6 Levofloxacin  Dose adjustments this admission: N/A  Microbiology results: 12/6 BCx: sent 12/6 Sputum: sent  12/6 MRSA PCR: sent 12/6 Resp. PCR sent  Thank you for allowing pharmacy to be a part of this patient's care.   Paticia Stack, PharmD Pharmacy Resident  03/23/2018 4:30 PM

## 2018-03-23 NOTE — Consult Note (Signed)
Pharmacy Antibiotic Note  Shane Frazier. is a 52 y.o. male admitted on 03/23/2018 with pneumonia.  Pharmacy has been consulted for Cefepime + Vancomycin dosing.  Plan: Continue Cefepime 2 g IV q8h. Ordered Vancomycin 1250 mg IV q12h starting @ 2000. Will order trough prior to 4th dose on 12/7 @ 1930. Goal trough of 15-20.  Vancomycin Kinetics Using adj BW 80.3 kg and CrCl 91.9 mL/min ke 0.081 Vd 56.2  T1/2 8.6 hrs  Height: 5\' 7"  (170.2 cm) Weight: 223 lb 15.8 oz (101.6 kg) IBW/kg (Calculated) : 66.1  Temp (24hrs), Avg:98.8 F (37.1 C), Min:98.8 F (37.1 C), Max:98.8 F (37.1 C)  Recent Labs  Lab 03/23/18 1239  WBC 10.1  CREATININE 1.08    Estimated Creatinine Clearance: 91.9 mL/min (by C-G formula based on SCr of 1.08 mg/dL).    Allergies  Allergen Reactions  . Other Other (See Comments)    BRAZILIAN NUTS ONLY-"THROAT CLOSES UP"    Antimicrobials this admission: 12/6 Cefepime >>  12/6 Vancomycin >>   Dose adjustments this admission: N/A  Microbiology results: 12/6 MRSA PCR: sent  Thank you for allowing pharmacy to be a part of this patient's care.   Paticia Stack, PharmD Pharmacy Resident  03/23/2018 2:46 PM

## 2018-03-23 NOTE — Progress Notes (Signed)
Family Meeting Note  Advance Directive:yes  Today a meeting took place with the Patient.     The following clinical team members were present during this meeting:MD  The following were discussed:Patient's diagnosis: Healthcare associated pneumonia failed outpatient antibiotics x2, acute hypoxic respiratory failure requiring oxygen, history of diabetes mellitus, hyperlipidemia, seizures, migraine headache, hypothyroidism, tobacco sniffing, treatment plan of care discussed in detail with the patient and fianc and daughter at bedside.  They all verbalized understanding of the plan   patient's progosis: Unable to determine and Goals for treatment: Full Code  Shane Frazier and daughter Shane Frazier are healthcare power of attorney  Additional follow-up to be provided: Hospitalist, infectious disease  Time spent during discussion:17 min  Nicholes Mango, MD

## 2018-03-24 LAB — GLUCOSE, CAPILLARY
Glucose-Capillary: 125 mg/dL — ABNORMAL HIGH (ref 70–99)
Glucose-Capillary: 95 mg/dL (ref 70–99)
Glucose-Capillary: 167 mg/dL — ABNORMAL HIGH (ref 70–99)
Glucose-Capillary: 408 mg/dL — ABNORMAL HIGH (ref 70–99)

## 2018-03-24 LAB — HIV ANTIBODY (ROUTINE TESTING W REFLEX): HIV Screen 4th Generation wRfx: NONREACTIVE

## 2018-03-24 LAB — TROPONIN I: Troponin I: 0.12 ng/mL (ref <0.03)

## 2018-03-24 MED ORDER — METHYLPREDNISOLONE SODIUM SUCC 125 MG IJ SOLR
60.0000 mg | Freq: Two times a day (BID) | INTRAMUSCULAR | Status: AC
  Administered 2018-03-24 (×2): 60 mg via INTRAVENOUS
  Filled 2018-03-24 (×2): qty 2

## 2018-03-24 MED ORDER — METHYLPREDNISOLONE SODIUM SUCC 125 MG IJ SOLR
60.0000 mg | INTRAMUSCULAR | Status: DC
  Administered 2018-03-25 – 2018-03-26 (×2): 60 mg via INTRAVENOUS
  Filled 2018-03-24 (×3): qty 2

## 2018-03-24 MED ORDER — INSULIN ASPART 100 UNIT/ML ~~LOC~~ SOLN
10.0000 [IU] | Freq: Once | SUBCUTANEOUS | Status: AC
  Administered 2018-03-24: 10 [IU] via SUBCUTANEOUS
  Filled 2018-03-24: qty 1

## 2018-03-24 NOTE — Progress Notes (Signed)
[facility] [place] INFECTIOUS DISEASE PROGRESS NOTE Date of Admission:  03/23/2018     ID: Shane Picket. is a 52 y.o. male with Multifocal pneumonia     Active Problems:   Multifocal pneumonia   Failure of outpatient treatment   Shortness of breath   Subjective: No fevers  ROS  Eleven systems are reviewed and negative except per hpi  Medications:  Antibiotics Given (last 72 hours)    Date/Time Action Medication Dose Rate   03/23/18 1409 New Bag/Given   ceFEPIme (MAXIPIME) 2 g in sodium chloride 0.9 % 100 mL IVPB 2 g 200 mL/hr   03/23/18 1443 New Bag/Given   vancomycin (VANCOCIN) IVPB 1000 mg/200 mL premix 1,000 mg 200 mL/hr   03/23/18 1756 New Bag/Given   levofloxacin (LEVAQUIN) IVPB 750 mg 750 mg 100 mL/hr   03/23/18 2144 New Bag/Given   vancomycin (VANCOCIN) 1,250 mg in sodium chloride 0.9 % 250 mL IVPB 1,250 mg 166.7 mL/hr   03/23/18 2316 New Bag/Given   ceFEPIme (MAXIPIME) 2 g in sodium chloride 0.9 % 100 mL IVPB 2 g 200 mL/hr   03/24/18 0536 New Bag/Given   ceFEPIme (MAXIPIME) 2 g in sodium chloride 0.9 % 100 mL IVPB 2 g 200 mL/hr   03/24/18 0939 New Bag/Given   vancomycin (VANCOCIN) 1,250 mg in sodium chloride 0.9 % 250 mL IVPB 1,250 mg 166.7 mL/hr     . atorvastatin  20 mg Oral BH-q7a  . diltiazem  240 mg Oral BH-q7a   Or  . diltiazem  180 mg Oral BH-q7a  . enoxaparin (LOVENOX) injection  40 mg Subcutaneous Q24H  . glimepiride  4 mg Oral Q breakfast  . hydrochlorothiazide  25 mg Oral Daily  . insulin aspart  0-5 Units Subcutaneous QHS  . insulin aspart  0-9 Units Subcutaneous TID WC  . levothyroxine  200 mcg Oral QAC breakfast  . lisinopril  20 mg Oral BH-q7a  . nicotine  14 mg Transdermal Daily    Objective: Vital signs in last 24 hours: Temp:  [97.9 F (36.6 C)-98.8 F (37.1 C)] 97.9 F (36.6 C) (12/07 0401) Pulse Rate:  [102-122] 105 (12/07 0401) Resp:  [16-20] 19 (12/07 0401) BP: (143-173)/(86-106) 160/106 (12/07 0401) SpO2:  [88 %-97 %] 94 % (12/07  0401) Weight:  [101.6 kg] 101.6 kg (12/06 1244) Constitutional: He is oriented to person, place, and time. He appears well-developed and well-nourished. No distress.  HENT: Anicteric Mouth/Throat: Oropharynx is clear and moist. No oropharyngeal exudate.  Cardiovascular: Normal rate, regular rhythm and normal heart sounds. Exam reveals no gallop and no friction rub.  Pulmonary/Chest: Decent air movement.  Bilateral rhonchi.  No wheeze.   Abdominal: Soft. Bowel sounds are normal. He exhibits no distension. There is no tenderness.  Lymphadenopathy: He has no cervical adenopathy.  Neurological: He is alert and oriented to person, place, and time.  Skin: Skin is warm and dry. No rash noted. No erythema.  Psychiatric: He has a normal mood and affect. His behavior is normal.  Extremities-no clubbing  Lab Results Recent Labs    03/23/18 1239 03/23/18 1656  WBC 10.1 10.0  HGB 16.3 15.6  HCT 50.2 47.7  NA 138  --   K 3.3*  --   CL 99  --   CO2 31  --   BUN 12  --   CREATININE 1.08 1.00    Microbiology: Results for orders placed or performed during the hospital encounter of 03/23/18  Culture, sputum-assessment  Status: None   Collection Time: 03/23/18  4:22 PM  Result Value Ref Range Status   Specimen Description EXPECTORATED SPUTUM  Final   Special Requests NONE  Final   Sputum evaluation   Final    THIS SPECIMEN IS ACCEPTABLE FOR SPUTUM CULTURE Performed at [facility], [address] [place], [place] [address]    Report Status 03/23/2018 FINAL  Final  Culture, respiratory     Status: None (Preliminary result)   Collection Time: 03/23/18  4:22 PM  Result Value Ref Range Status   Specimen Description   Final    EXPECTORATED SPUTUM Performed at [facility], [address] [place], [place] [address]    Special Requests   Final    NONE Reflexed from [id number] Performed at [facility], [address] [place], [place] [address]    Gram Stain    Final    FEW WBC PRESENT, PREDOMINANTLY PMN RARE SQUAMOUS EPITHELIAL CELLS PRESENT FEW GRAM POSITIVE COCCI RARE GRAM POSITIVE RODS Performed at [facility], [place] [address], [place], [place] [address]    Culture PENDING  Incomplete   Report Status PENDING  Incomplete  Shane Frazier Screening     Status: None   Collection Time: 03/23/18  4:38 PM  Result Value Ref Range Status   Shane by Frazier NEGATIVE NEGATIVE Final    Comment:        The GeneXpert Shane Assay (FDA approved for NASAL specimens only), is one component of a comprehensive Shane colonization surveillance program. It is not intended to diagnose Shane infection nor to guide or monitor treatment for Shane infections. Performed at [facility], [address] [place], [place] [address]   Respiratory Panel by Frazier     Status: None   Collection Time: 03/23/18  4:43 PM  Result Value Ref Range Status   Adenovirus NOT DETECTED NOT DETECTED Final   Coronavirus 229E NOT DETECTED NOT DETECTED Final   Coronavirus HKU1 NOT DETECTED NOT DETECTED Final   Coronavirus NL63 NOT DETECTED NOT DETECTED Final   Coronavirus OC43 NOT DETECTED NOT DETECTED Final   Metapneumovirus NOT DETECTED NOT DETECTED Final   Rhinovirus / Enterovirus NOT DETECTED NOT DETECTED Final   Influenza A NOT DETECTED NOT DETECTED Final   Influenza B NOT DETECTED NOT DETECTED Final   Parainfluenza Virus 1 NOT DETECTED NOT DETECTED Final   Parainfluenza Virus 2 NOT DETECTED NOT DETECTED Final   Parainfluenza Virus 3 NOT DETECTED NOT DETECTED Final   Parainfluenza Virus 4 NOT DETECTED NOT DETECTED Final   Respiratory Syncytial Virus NOT DETECTED NOT DETECTED Final   Bordetella pertussis NOT DETECTED NOT DETECTED Final   Chlamydophila pneumoniae NOT DETECTED NOT DETECTED Final   Mycoplasma pneumoniae NOT DETECTED NOT DETECTED Final  Culture, blood (routine x 2) Call MD if unable to obtain prior to antibiotics being given     Status: None (Preliminary  result)   Collection Time: 03/23/18  4:56 PM  Result Value Ref Range Status   Specimen Description BLOOD LEFT ANTECUBITAL  Final   Special Requests   Final    BOTTLES DRAWN AEROBIC AND ANAEROBIC Blood Culture adequate volume   Culture   Final    NO GROWTH < 12 HOURS Performed at [facility], [address] [place], [place] [address]    Report Status PENDING  Incomplete  Culture, blood (Routine X 2) w Reflex to ID Panel     Status: None (Preliminary result)  Collection Time: 03/24/18  2:43 AM  Result Value Ref Range Status   Specimen Description BLOOD LEFT ANTECUBITAL  Final   Special Requests   Final    BOTTLES DRAWN AEROBIC AND ANAEROBIC Blood Culture results may not be optimal due to an excessive volume of blood received in culture bottles   Culture   Final    NO GROWTH < 12 HOURS Performed at [facility], [address] [place], [place] [address]    Report Status PENDING  Incomplete    Studies/Results: Dg Chest 2 View  Result Date: 03/23/2018 CLINICAL DATA:  Shortness of breath. EXAM: CHEST - 2 VIEW COMPARISON:  Radiographs of March 02, 2018. FINDINGS: The heart size and mediastinal contours are within normal limits. No pneumothorax or pleural effusion is noted. There is interval development of diffuse right lung opacity as well as focal lingular opacity most consistent with multifocal pneumonia. The visualized skeletal structures are unremarkable. IMPRESSION: Interval development of bilateral pneumonia, with right much larger than left. Electronically Signed   By: Marijo Conception, M.D.   On: 03/23/2018 12:59   Ct Angio Chest Pe W And/or Wo Contrast  Result Date: 03/23/2018 CLINICAL DATA:  Increased shortness of breath for the past 3 weeks. Diagnosed with pneumonia 3 weeks ago. EXAM: CT ANGIOGRAPHY CHEST WITH CONTRAST TECHNIQUE: Multidetector CT imaging of the chest was performed using the standard protocol during bolus administration of intravenous  contrast. Multiplanar CT image reconstructions and MIPs were obtained to evaluate the vascular anatomy. CONTRAST:  46mL OMNIPAQUE IOHEXOL 350 MG/ML SOLN COMPARISON:  Chest radiographs obtained earlier today and on 03/02/2018. FINDINGS: Cardiovascular: Mildly enlarged heart. Normally opacified pulmonary arteries with no pulmonary arterial filling defects seen. Mediastinum/Nodes: Small hiatal hernia. Mildly enlarged proximal right hilar and subcarinal lymph nodes. The largest has a short axis diameter of 12 mm on image number 35 series 4. Lungs/Pleura: Small to moderate-sized right pleural effusion. Marked patchy opacity throughout the majority of the right lung. Similar opacities in the left lung. These are involving all the lobes of both lungs. Upper Abdomen: Unremarkable. Musculoskeletal: Thoracic and cervicothoracic spine degenerative changes. Review of the MIP images confirms the above findings. IMPRESSION: 1. No pulmonary emboli. 2. Extensive bilateral multilobar pneumonia, greater on the right. 3. Small to moderate-sized right pleural effusion. 4. Mild mediastinal and right hilar adenopathy, most likely reactive. 5. Small hiatal hernia. Electronically Signed   By: Claudie Revering M.D.   On: 03/23/2018 13:48    Assessment/Plan: Shane Petersen. is a 52 y.o. male with a history of poorly controlled diabetes as well as hypothyroidism admitted with multifocal infiltrates following an illness over the last 3-4weeks.  The initial illness was ongoing for 1 week with a cough prior to being seen and having a chest x-ray showing a small right infiltrate.  This was treated with Augmentin and azithromycin with some improvement but not complete resolution.  He is also not been on levofloxacin for 5 days. His CT is quite impressive with bilateral (R>>L) multifocal infiltrates.  However surprisingly he appears relatively well.  He has no known unusual exposures or travel history.  He had been well prior to this illness however  does report multifactorial weight loss over the last year of about 40 pounds but reports he is gaining weight again. 12/7-  No fevers, HIV, FLU, Resp Frazier, urine strep Pna negative.  Shane Frazier neg Still unclear etiology. I don't think this is a routine bacterial pneumonia but likely post infectious  inflammation such as BOOP and would start steroids.  Recommendations Pending  urine Legionella antigen. Pending sputum culture  Continue vancomycin and cefepime and levofloxacin  Start prednisone 60 mg qd Would repeat cxr on Monday and consult pulmonary on Monday for bronchoscopy. Thank you very much for the consult. Will follow with you.  Leonel Ramsay   03/24/2018, 11:29 AM

## 2018-03-24 NOTE — Progress Notes (Signed)
[place] at [place] NAME: Shane Frazier    MR#:  [id number]  DATE OF BIRTH:  Oct 17, 1965  SUBJECTIVE:   Patient states shortness of breath is somewhat improved  REVIEW OF SYSTEMS:    Review of Systems  Constitutional: Negative for fever, chills weight loss HENT: Negative for ear pain, nosebleeds, congestion, facial swelling, rhinorrhea, neck pain, neck stiffness and ear discharge.   Respiratory: ++ for cough, shortness of breath, no wheezing  Cardiovascular: Negative for chest pain, palpitations and leg swelling.  Gastrointestinal: Negative for heartburn, abdominal pain, vomiting, diarrhea or consitpation Genitourinary: Negative for dysuria, urgency, frequency, hematuria Musculoskeletal: Negative for back pain or joint pain Neurological: Negative for dizziness, seizures, syncope, focal weakness,  numbness and headaches.  Hematological: Does not bruise/bleed easily.  Psychiatric/Behavioral: Negative for hallucinations, confusion, dysphoric mood    Tolerating Diet:yes      DRUG ALLERGIES:   Allergies  Allergen Reactions  . Other Other (See Comments)    BRAZILIAN NUTS ONLY-"THROAT CLOSES UP"    VITALS:  Blood pressure (!) 160/106, pulse (!) 105, temperature 97.9 F (36.6 C), resp. rate 19, height 5\' 7"  (1.702 m), weight 101.6 kg, SpO2 94 %.  PHYSICAL EXAMINATION:  Constitutional: Appears well-developed and well-nourished. No distress. HENT: Normocephalic. Marland Kitchen Oropharynx is clear and moist.  Eyes: Conjunctivae and EOM are normal. PERRLA, no scleral icterus.  Neck: Normal ROM. Neck supple. No JVD. No tracheal deviation. CVS: RRR, S1/S2 +, no murmurs, no gallops, no carotid bruit.  Pulmonary: Effort and breath sounds normal, ++b/l lower lobe rhonchi, no wheezes, rales.  Abdominal: Soft. BS +,  no distension, tenderness, rebound or guarding.  Musculoskeletal: Normal range of motion. No edema and no tenderness.  Neuro: Alert. CN 2-12  grossly intact. No focal deficits. Skin: Skin is warm and dry. No rash noted. Psychiatric: Normal mood and affect.      LABORATORY PANEL:   CBC Recent Labs  Lab 03/23/18 1656  WBC 10.0  HGB 15.6  HCT 47.7  PLT 398   ------------------------------------------------------------------------------------------------------------------  Chemistries  Recent Labs  Lab 03/23/18 1239 03/23/18 1656  NA 138  --   K 3.3*  --   CL 99  --   CO2 31  --   GLUCOSE 162*  --   BUN 12  --   CREATININE 1.08 1.00  CALCIUM 8.6*  --    ------------------------------------------------------------------------------------------------------------------  Cardiac Enzymes Recent Labs  Lab 03/23/18 1523 03/23/18 2048 03/24/18 0243  TROPONINI 0.12* 0.12* 0.12*   ------------------------------------------------------------------------------------------------------------------  RADIOLOGY:  Dg Chest 2 View  Result Date: 03/23/2018 CLINICAL DATA:  Shortness of breath. EXAM: CHEST - 2 VIEW COMPARISON:  Radiographs of March 02, 2018. FINDINGS: The heart size and mediastinal contours are within normal limits. No pneumothorax or pleural effusion is noted. There is interval development of diffuse right lung opacity as well as focal lingular opacity most consistent with multifocal pneumonia. The visualized skeletal structures are unremarkable. IMPRESSION: Interval development of bilateral pneumonia, with right much larger than left. Electronically Signed   By: Marijo Conception, M.D.   On: 03/23/2018 12:59   Ct Angio Chest Pe W And/or Wo Contrast  Result Date: 03/23/2018 CLINICAL DATA:  Increased shortness of breath for the past 3 weeks. Diagnosed with pneumonia 3 weeks ago. EXAM: CT ANGIOGRAPHY CHEST WITH CONTRAST TECHNIQUE: Multidetector CT imaging of the chest was performed using the standard protocol during bolus administration of intravenous contrast. Multiplanar CT image reconstructions and MIPs were  obtained to evaluate the vascular anatomy. CONTRAST:  34mL OMNIPAQUE IOHEXOL 350 MG/ML SOLN COMPARISON:  Chest radiographs obtained earlier today and on 03/02/2018. FINDINGS: Cardiovascular: Mildly enlarged heart. Normally opacified pulmonary arteries with no pulmonary arterial filling defects seen. Mediastinum/Nodes: Small hiatal hernia. Mildly enlarged proximal right hilar and subcarinal lymph nodes. The largest has a short axis diameter of 12 mm on image number 35 series 4. Lungs/Pleura: Small to moderate-sized right pleural effusion. Marked patchy opacity throughout the majority of the right lung. Similar opacities in the left lung. These are involving all the lobes of both lungs. Upper Abdomen: Unremarkable. Musculoskeletal: Thoracic and cervicothoracic spine degenerative changes. Review of the MIP images confirms the above findings. IMPRESSION: 1. No pulmonary emboli. 2. Extensive bilateral multilobar pneumonia, greater on the right. 3. Small to moderate-sized right pleural effusion. 4. Mild mediastinal and right hilar adenopathy, most likely reactive. 5. Small hiatal hernia. Electronically Signed   By: Claudie Revering M.D.   On: 03/23/2018 13:48     ASSESSMENT AND PLAN:   52 year old male with poorly controlled diabetes and hypothyroidism who presented to the ER due to shortness of breath.  1.  Community-acquired pneumonia: CT chest shows extensive multilobar pneumonia without evidence of pulmonary emboli  patient has been treated with Augmentin, azithromycin and Levaquin without complete resolution of his symptoms.  Patient evaluated by Dr. Ola Spurr.  Recommendations for cefepime and vancomycin, as well as Levaquin to cover empirically for Legionella. Respiratory viral panel and influenza are negative. Follow-up on urine Legionella and strep pneumoniae antigen Follow-up on HIV    2.  Elevated troponin: This is due to demand ischemia.  Patient has ruled out for ACS.  Troponins are flat.  3.   Hypokalemia: Replete  4.  Diabetes: Continue Amaryl with ADA diet and sliding scale  5.  Essential hypertension: Continue lisinopril, diltiazem and HCTZ  6.  Hypothyroidism: Continue Synthroid  7. Tobacco dependence: Patient is encouraged to quit chewing tobacco. Counseling was provided for 4 minutes.  Management plans discussed with the patient and he is in agreement.  CODE STATUS: FULL  TOTAL TIME TAKING CARE OF THIS PATIENT: 30 minutes.     POSSIBLE D/C 2-4 days, DEPENDING ON CLINICAL CONDITION.   Inaki Vantine M.D on 03/24/2018 at 11:33 AM  Between 7am to 6pm - Pager - [phone] After 6pm go to [email or web address] - password [email or web address] [facility]  Office  [phone]  CC: Primary care physician; Olin Hauser, DO  Note: This dictation was prepared with Dragon dictation along with smaller phrase technology. Any transcriptional errors that result from this process are unintentional.

## 2018-03-25 ENCOUNTER — Inpatient Hospital Stay: Payer: BLUE CROSS/BLUE SHIELD

## 2018-03-25 LAB — CBC
HEMATOCRIT: 46.5 % (ref 39.0–52.0)
Hemoglobin: 15.3 g/dL (ref 13.0–17.0)
MCH: 29 pg (ref 26.0–34.0)
MCHC: 32.9 g/dL (ref 30.0–36.0)
MCV: 88.1 fL (ref 80.0–100.0)
Platelets: 444 10*3/uL — ABNORMAL HIGH (ref 150–400)
RBC: 5.28 MIL/uL (ref 4.22–5.81)
RDW: 13.3 % (ref 11.5–15.5)
WBC: 15.3 10*3/uL — AB (ref 4.0–10.5)
nRBC: 0 % (ref 0.0–0.2)

## 2018-03-25 LAB — LEGIONELLA PNEUMOPHILA SEROGP 1 UR AG: L. pneumophila Serogp 1 Ur Ag: NEGATIVE

## 2018-03-25 LAB — GLUCOSE, CAPILLARY
Glucose-Capillary: 212 mg/dL — ABNORMAL HIGH (ref 70–99)
Glucose-Capillary: 280 mg/dL — ABNORMAL HIGH (ref 70–99)
Glucose-Capillary: 191 mg/dL — ABNORMAL HIGH (ref 70–99)
Glucose-Capillary: 309 mg/dL — ABNORMAL HIGH (ref 70–99)

## 2018-03-25 LAB — BASIC METABOLIC PANEL
Anion gap: 8 (ref 5–15)
BUN: 15 mg/dL (ref 6–20)
CHLORIDE: 99 mmol/L (ref 98–111)
CO2: 30 mmol/L (ref 22–32)
Calcium: 8.5 mg/dL — ABNORMAL LOW (ref 8.9–10.3)
Creatinine, Ser: 0.96 mg/dL (ref 0.61–1.24)
GFR calc Af Amer: >60 mL/min (ref >60)
GFR calc non Af Amer: >60 mL/min (ref >60)
Glucose, Bld: 179 mg/dL — ABNORMAL HIGH (ref 70–99)
POTASSIUM: 3.4 mmol/L — AB (ref 3.5–5.1)
SODIUM: 137 mmol/L (ref 135–145)

## 2018-03-25 MED ORDER — INSULIN ASPART 100 UNIT/ML ~~LOC~~ SOLN: 0.0000 [IU] | Freq: Every day | SUBCUTANEOUS | Status: DC

## 2018-03-25 MED ORDER — POTASSIUM CHLORIDE CRYS ER 20 MEQ PO TBCR
40.0000 meq | EXTENDED_RELEASE_TABLET | Freq: Once | ORAL | Status: AC
  Administered 2018-03-25: 40 meq via ORAL
  Filled 2018-03-25: qty 2

## 2018-03-25 MED ORDER — ORAL CARE MOUTH RINSE
15.0000 mL | Freq: Two times a day (BID) | OROMUCOSAL | Status: DC
  Administered 2018-03-25 – 2018-03-26 (×3): 15 mL via OROMUCOSAL

## 2018-03-25 MED ORDER — INSULIN ASPART 100 UNIT/ML ~~LOC~~ SOLN
0.0000 [IU] | Freq: Three times a day (TID) | SUBCUTANEOUS | Status: DC
  Administered 2018-03-25: 10:00:00 5 [IU] via SUBCUTANEOUS
  Administered 2018-03-25: 17:00:00 3 [IU] via SUBCUTANEOUS
  Administered 2018-03-25: 13:00:00 8 [IU] via SUBCUTANEOUS
  Administered 2018-03-26: 5 [IU] via SUBCUTANEOUS
  Administered 2018-03-26: 3 [IU] via SUBCUTANEOUS
  Administered 2018-03-26: 2 [IU] via SUBCUTANEOUS
  Administered 2018-03-27 – 2018-03-28 (×3): 3 [IU] via SUBCUTANEOUS
  Filled 2018-03-25 (×8): qty 1

## 2018-03-25 NOTE — Progress Notes (Signed)
[place] at [place] NAME: Shane Frazier    MR#:  [id number]  DATE OF BIRTH:  05-16-1965  SUBJECTIVE:   Still with SOB and cough   REVIEW OF SYSTEMS:    Review of Systems  Constitutional: Negative for fever, chills weight loss HENT: Negative for ear pain, nosebleeds, congestion, facial swelling, rhinorrhea, neck pain, neck stiffness and ear discharge.   Respiratory: ++ for cough, shortness of breath, no wheezing  Cardiovascular: Negative for chest pain, palpitations and leg swelling.  Gastrointestinal: Negative for heartburn, abdominal pain, vomiting, diarrhea or consitpation Genitourinary: Negative for dysuria, urgency, frequency, hematuria Musculoskeletal: Negative for back pain or joint pain Neurological: Negative for dizziness, seizures, syncope, focal weakness,  numbness and headaches.  Hematological: Does not bruise/bleed easily.  Psychiatric/Behavioral: Negative for hallucinations, confusion, dysphoric mood    Tolerating Diet:yes      DRUG ALLERGIES:   Allergies  Allergen Reactions  . Other Other (See Comments)    BRAZILIAN NUTS ONLY-"THROAT CLOSES UP"    VITALS:  Blood pressure (!) 147/92, pulse (!) 103, temperature 98.6 F (37 C), temperature source Oral, resp. rate (!) 28, height 5\' 7"  (1.702 m), weight 101.6 kg, SpO2 91 %.  PHYSICAL EXAMINATION:  Constitutional: Appears well-developed and well-nourished. No distress. HENT: Normocephalic. Marland Kitchen Oropharynx is clear and moist.  Eyes: Conjunctivae and EOM are normal. PERRLA, no scleral icterus.  Neck: Normal ROM. Neck supple. No JVD. No tracheal deviation. CVS: RRR, S1/S2 +, no murmurs, no gallops, no carotid bruit.  Pulmonary: Effort and breath sounds normal, ++b/l lower lobe rhonchi, no wheezes, rales.  Abdominal: Soft. BS +,  no distension, tenderness, rebound or guarding.  Musculoskeletal: Normal range of motion. No edema and no tenderness.  Neuro: Alert. CN 2-12 grossly  intact. No focal deficits. Skin: Skin is warm and dry. No rash noted. Psychiatric: Normal mood and affect.      LABORATORY PANEL:   CBC Recent Labs  Lab 03/25/18 0451  WBC 15.3*  HGB 15.3  HCT 46.5  PLT 444*   ------------------------------------------------------------------------------------------------------------------  Chemistries  Recent Labs  Lab 03/25/18 0451  NA 137  K 3.4*  CL 99  CO2 30  GLUCOSE 179*  BUN 15  CREATININE 0.96  CALCIUM 8.5*   ------------------------------------------------------------------------------------------------------------------  Cardiac Enzymes Recent Labs  Lab 03/23/18 1523 03/23/18 2048 03/24/18 0243  TROPONINI 0.12* 0.12* 0.12*   ------------------------------------------------------------------------------------------------------------------  RADIOLOGY:  Dg Chest 2 View  Result Date: 03/23/2018 CLINICAL DATA:  Shortness of breath. EXAM: CHEST - 2 VIEW COMPARISON:  Radiographs of March 02, 2018. FINDINGS: The heart size and mediastinal contours are within normal limits. No pneumothorax or pleural effusion is noted. There is interval development of diffuse right lung opacity as well as focal lingular opacity most consistent with multifocal pneumonia. The visualized skeletal structures are unremarkable. IMPRESSION: Interval development of bilateral pneumonia, with right much larger than left. Electronically Signed   By: Marijo Conception, M.D.   On: 03/23/2018 12:59   Ct Angio Chest Pe W And/or Wo Contrast  Result Date: 03/23/2018 CLINICAL DATA:  Increased shortness of breath for the past 3 weeks. Diagnosed with pneumonia 3 weeks ago. EXAM: CT ANGIOGRAPHY CHEST WITH CONTRAST TECHNIQUE: Multidetector CT imaging of the chest was performed using the standard protocol during bolus administration of intravenous contrast. Multiplanar CT image reconstructions and MIPs were obtained to evaluate the vascular anatomy. CONTRAST:  60mL  OMNIPAQUE IOHEXOL 350 MG/ML SOLN COMPARISON:  Chest radiographs obtained earlier today and  on 03/02/2018. FINDINGS: Cardiovascular: Mildly enlarged heart. Normally opacified pulmonary arteries with no pulmonary arterial filling defects seen. Mediastinum/Nodes: Small hiatal hernia. Mildly enlarged proximal right hilar and subcarinal lymph nodes. The largest has a short axis diameter of 12 mm on image number 35 series 4. Lungs/Pleura: Small to moderate-sized right pleural effusion. Marked patchy opacity throughout the majority of the right lung. Similar opacities in the left lung. These are involving all the lobes of both lungs. Upper Abdomen: Unremarkable. Musculoskeletal: Thoracic and cervicothoracic spine degenerative changes. Review of the MIP images confirms the above findings. IMPRESSION: 1. No pulmonary emboli. 2. Extensive bilateral multilobar pneumonia, greater on the right. 3. Small to moderate-sized right pleural effusion. 4. Mild mediastinal and right hilar adenopathy, most likely reactive. 5. Small hiatal hernia. Electronically Signed   By: Claudie Revering M.D.   On: 03/23/2018 13:48     ASSESSMENT AND PLAN:   52 year old male with poorly controlled diabetes and hypothyroidism who presented to the ER due to shortness of breath.  1.  Community-acquired pneumonia: CT chest shows extensive multilobar pneumonia without evidence of pulmonary emboli  patient has been treated with Augmentin, azithromycin and Levaquin without complete resolution of his symptoms.  Patient evaluated by Dr. Ola Spurr.  Recommendations for cefepime and vancomycin, as well as Levaquin to cover empirically for Legionella. Continue steroids for possible inflammatory/ILD etiology Pulmonary consultation to be placed tomorrow for possible bronchoscopy Chest x-ray for tomorrow Respiratory viral panel and influenza are negative. Follow-up on urine Legionella and strep pneumoniae antigen HIV nonreactive Respiratory culture for  gram-positive cocci and rods  2.  Elevated troponin: This is due to demand ischemia.  Patient has ruled out for ACS.  Troponins are flat.  3.  Hypokalemia: Replete  4.  Diabetes: Continue Amaryl with ADA diet and sliding scale  5.  Essential hypertension: Continue lisinopril, diltiazem and HCTZ  6.  Hypothyroidism: Continue Synthroid  7. Tobacco dependence: Patient is encouraged to quit chewing tobacco. Counseling was provided.   Management plans discussed with the patient and he is in agreement.  CODE STATUS: FULL  TOTAL TIME TAKING CARE OF THIS PATIENT: 24 minutes.     POSSIBLE D/C 2-4 days, DEPENDING ON CLINICAL CONDITION.   Fabiano Ginley M.D on 03/25/2018 at 10:31 AM  Between 7am to 6pm - Pager - [phone] After 6pm go to [email or web address] - password [email or web address] [facility]  Office  [phone]  CC: Primary care physician; Olin Hauser, DO  Note: This dictation was prepared with Dragon dictation along with smaller phrase technology. Any transcriptional errors that result from this process are unintentional.

## 2018-03-25 NOTE — Progress Notes (Signed)
Pt walked around the nursing station x3 on RA. Pt slight sob , but does not seems in respiratory distress.  Immediately after walking O2 sats checked and was 82% on RA.  Applied 2L O2 per [place], O2 sats up to 95% within a min.  Advised pt to ambulate with O2.

## 2018-03-25 NOTE — Progress Notes (Signed)
[place] INFECTIOUS DISEASE PROGRESS NOTE Date of Admission:  03/23/2018     ID: Shane Frazier. is a 52 y.o. male with Multifocal pneumonia     Active Problems:   Multifocal pneumonia   Failure of outpatient treatment   Shortness of breath   Subjective: No fevers but still sob and cough. 91% on 2 L ROS  Eleven systems are reviewed and negative except per hpi  Medications:  Antibiotics Given (last 72 hours)    Date/Time Action Medication Dose Rate   03/23/18 1409 New Bag/Given   ceFEPIme (MAXIPIME) 2 g in sodium chloride 0.9 % 100 mL IVPB 2 g 200 mL/hr   03/23/18 1443 New Bag/Given   vancomycin (VANCOCIN) IVPB 1000 mg/200 mL premix 1,000 mg 200 mL/hr   03/23/18 1756 New Bag/Given   levofloxacin (LEVAQUIN) IVPB 750 mg 750 mg 100 mL/hr   03/23/18 2144 New Bag/Given   vancomycin (VANCOCIN) 1,250 mg in sodium chloride 0.9 % 250 mL IVPB 1,250 mg 166.7 mL/hr   03/23/18 2316 New Bag/Given   ceFEPIme (MAXIPIME) 2 g in sodium chloride 0.9 % 100 mL IVPB 2 g 200 mL/hr   03/24/18 0536 New Bag/Given   ceFEPIme (MAXIPIME) 2 g in sodium chloride 0.9 % 100 mL IVPB 2 g 200 mL/hr   03/24/18 0939 New Bag/Given   vancomycin (VANCOCIN) 1,250 mg in sodium chloride 0.9 % 250 mL IVPB 1,250 mg 166.7 mL/hr   03/24/18 1354 New Bag/Given   ceFEPIme (MAXIPIME) 2 g in sodium chloride 0.9 % 100 mL IVPB 2 g 200 mL/hr   03/24/18 1651 New Bag/Given   levofloxacin (LEVAQUIN) IVPB 750 mg 750 mg 100 mL/hr   03/24/18 2025 New Bag/Given   vancomycin (VANCOCIN) 1,250 mg in sodium chloride 0.9 % 250 mL IVPB 1,250 mg 166.7 mL/hr   03/24/18 2250 New Bag/Given   ceFEPIme (MAXIPIME) 2 g in sodium chloride 0.9 % 100 mL IVPB 2 g 200 mL/hr   03/25/18 2956 New Bag/Given   ceFEPIme (MAXIPIME) 2 g in sodium chloride 0.9 % 100 mL IVPB 2 g 200 mL/hr   03/25/18 0941 New Bag/Given   vancomycin (VANCOCIN) 1,250 mg in sodium chloride 0.9 % 250 mL IVPB 1,250 mg 166.7 mL/hr     . atorvastatin  20 mg Oral BH-q7a  .  diltiazem  240 mg Oral BH-q7a   Or  . diltiazem  180 mg Oral BH-q7a  . enoxaparin (LOVENOX) injection  40 mg Subcutaneous Q24H  . glimepiride  4 mg Oral Q breakfast  . hydrochlorothiazide  25 mg Oral Daily  . insulin aspart  0-15 Units Subcutaneous TID WC  . insulin aspart  0-5 Units Subcutaneous QHS  . levothyroxine  200 mcg Oral QAC breakfast  . lisinopril  20 mg Oral BH-q7a  . methylPREDNISolone (SOLU-MEDROL) injection  60 mg Intravenous Q24H  . nicotine  14 mg Transdermal Daily    Objective: Vital signs in last 24 hours: Temp:  [98.3 F (36.8 C)-99.2 F (37.3 C)] 98.6 F (37 C) (12/08 0934) Pulse Rate:  [88-103] 103 (12/08 0934) Resp:  [18-28] 28 (12/08 0934) BP: (139-147)/(92-97) 147/92 (12/08 0934) SpO2:  [73 %-92 %] 91 % (12/08 0934) Constitutional: He is oriented to person, place, and time. He appears well-developed and well-nourished. No distress.  HENT: Anicteric Mouth/Throat: Oropharynx is clear and moist. No oropharyngeal exudate.  Cardiovascular: Normal rate, regular rhythm and normal heart sounds. Exam reveals no gallop and no friction rub.  Pulmonary/Chest: Decent air movement.  Bilateral  rhonchi.  No wheeze.   Abdominal: Soft. Bowel sounds are normal. He exhibits no distension. There is no tenderness.  Lymphadenopathy: He has no cervical adenopathy.  Neurological: He is alert and oriented to person, place, and time.  Skin: Skin is warm and dry. No rash noted. No erythema.  Psychiatric: He has a normal mood and affect. His behavior is normal.  Extremities-no clubbing  Lab Results Recent Labs    03/23/18 1239 03/23/18 1656 03/25/18 0451  WBC 10.1 10.0 15.3*  HGB 16.3 15.6 15.3  HCT 50.2 47.7 46.5  NA 138  --  137  K 3.3*  --  3.4*  CL 99  --  99  CO2 31  --  30  BUN 12  --  15  CREATININE 1.08 1.00 0.96    Microbiology: Results for orders placed or performed during the hospital encounter of 03/23/18  Culture, sputum-assessment     Status: None    Collection Time: 03/23/18  4:22 PM  Result Value Ref Range Status   Specimen Description EXPECTORATED SPUTUM  Final   Special Requests NONE  Final   Sputum evaluation   Final    THIS SPECIMEN IS ACCEPTABLE FOR SPUTUM CULTURE Performed at [facility], [address], [place], [place] [address]    Report Status 03/23/2018 FINAL  Final  Culture, respiratory     Status: None (Preliminary result)   Collection Time: 03/23/18  4:22 PM  Result Value Ref Range Status   Specimen Description   Final    EXPECTORATED SPUTUM Performed at [facility], [address] [place], [place] [address]    Special Requests   Final    NONE Reflexed from [id number] Performed at [facility], [address] [place], [place] [address]    Gram Stain   Final    FEW WBC PRESENT, PREDOMINANTLY PMN RARE SQUAMOUS EPITHELIAL CELLS PRESENT FEW GRAM POSITIVE COCCI RARE GRAM POSITIVE RODS    Culture   Final    FEW Consistent with normal respiratory flora. Performed at [facility], [place] [address], [place], [place] [address]    Report Status PENDING  Incomplete  MRSA PCR Screening     Status: None   Collection Time: 03/23/18  4:38 PM  Result Value Ref Range Status   MRSA by PCR NEGATIVE NEGATIVE Final    Comment:        The GeneXpert MRSA Assay (FDA approved for NASAL specimens only), is one component of a comprehensive MRSA colonization surveillance program. It is not intended to diagnose MRSA infection nor to guide or monitor treatment for MRSA infections. Performed at [facility], [address] [place],  [address]   Respiratory Panel by PCR     Status: None   Collection Time: 03/23/18  4:43 PM  Result Value Ref Range Status   Adenovirus NOT DETECTED NOT DETECTED Final   Coronavirus 229E NOT DETECTED NOT DETECTED Final   Coronavirus HKU1 NOT DETECTED NOT DETECTED Final   Coronavirus NL63 NOT DETECTED NOT DETECTED Final   Coronavirus OC43 NOT  DETECTED NOT DETECTED Final   Metapneumovirus NOT DETECTED NOT DETECTED Final   Rhinovirus / Enterovirus NOT DETECTED NOT DETECTED Final   Influenza A NOT DETECTED NOT DETECTED Final   Influenza B NOT DETECTED NOT DETECTED Final   Parainfluenza Virus 1 NOT DETECTED NOT DETECTED Final   Parainfluenza Virus 2 NOT DETECTED NOT DETECTED Final   Parainfluenza Virus 3 NOT DETECTED NOT DETECTED Final  Parainfluenza Virus 4 NOT DETECTED NOT DETECTED Final   Respiratory Syncytial Virus NOT DETECTED NOT DETECTED Final   Bordetella pertussis NOT DETECTED NOT DETECTED Final   Chlamydophila pneumoniae NOT DETECTED NOT DETECTED Final   Mycoplasma pneumoniae NOT DETECTED NOT DETECTED Final  Culture, blood (routine x 2) Call MD if unable to obtain prior to antibiotics being given     Status: None (Preliminary result)   Collection Time: 03/23/18  4:56 PM  Result Value Ref Range Status   Specimen Description BLOOD LEFT ANTECUBITAL  Final   Special Requests   Final    BOTTLES DRAWN AEROBIC AND ANAEROBIC Blood Culture adequate volume   Culture   Final    NO GROWTH 2 DAYS Performed at [facility], [address] [place], [place] [address]    Report Status PENDING  Incomplete  Culture, blood (Routine X 2) w Reflex to ID Panel     Status: None (Preliminary result)   Collection Time: 03/24/18  2:43 AM  Result Value Ref Range Status   Specimen Description BLOOD LEFT ANTECUBITAL  Final   Special Requests   Final    BOTTLES DRAWN AEROBIC AND ANAEROBIC Blood Culture results may not be optimal due to an excessive volume of blood received in culture bottles   Culture   Final    NO GROWTH 1 DAY Performed at [facility], [address] [place], [place] [address]    Report Status PENDING  Incomplete    Studies/Results: Dg Chest 2 View  Result Date: 03/25/2018 CLINICAL DATA:  Pneumonia. EXAM: CHEST - 2 VIEW COMPARISON:  Chest x-ray and chest CT 03/23/2018 FINDINGS: The heart is  borderline enlarged but stable. The mediastinal and hilar contours are unchanged. Persistent bilateral airspace disease most notably in the right upper lobe and consistent with multifocal pneumonia. No definite pleural effusions or pneumothorax. IMPRESSION: Persistent bilateral infiltrates. Electronically Signed   By: Marijo Sanes M.D.   On: 03/25/2018 10:36   Ct Angio Chest Pe W And/or Wo Contrast  Result Date: 03/23/2018 CLINICAL DATA:  Increased shortness of breath for the past 3 weeks. Diagnosed with pneumonia 3 weeks ago. EXAM: CT ANGIOGRAPHY CHEST WITH CONTRAST TECHNIQUE: Multidetector CT imaging of the chest was performed using the standard protocol during bolus administration of intravenous contrast. Multiplanar CT image reconstructions and MIPs were obtained to evaluate the vascular anatomy. CONTRAST:  81mL OMNIPAQUE IOHEXOL 350 MG/ML SOLN COMPARISON:  Chest radiographs obtained earlier today and on 03/02/2018. FINDINGS: Cardiovascular: Mildly enlarged heart. Normally opacified pulmonary arteries with no pulmonary arterial filling defects seen. Mediastinum/Nodes: Small hiatal hernia. Mildly enlarged proximal right hilar and subcarinal lymph nodes. The largest has a short axis diameter of 12 mm on image number 35 series 4. Lungs/Pleura: Small to moderate-sized right pleural effusion. Marked patchy opacity throughout the majority of the right lung. Similar opacities in the left lung. These are involving all the lobes of both lungs. Upper Abdomen: Unremarkable. Musculoskeletal: Thoracic and cervicothoracic spine degenerative changes. Review of the MIP images confirms the above findings. IMPRESSION: 1. No pulmonary emboli. 2. Extensive bilateral multilobar pneumonia, greater on the right. 3. Small to moderate-sized right pleural effusion. 4. Mild mediastinal and right hilar adenopathy, most likely reactive. 5. Small hiatal hernia. Electronically Signed   By: Claudie Revering M.D.   On: 03/23/2018 13:48     Assessment/Plan: Shane Frazier. is a 52 y.o. male with a history of poorly controlled diabetes as well as hypothyroidism admitted with multifocal infiltrates  following an illness over the last 3-4weeks.  The initial illness was ongoing for 1 week with a cough prior to being seen and having a chest x-ray showing a small right infiltrate.  This was treated with Augmentin and azithromycin with some improvement but not complete resolution.  He is also not been on levofloxacin for 5 days. His CT is quite impressive with bilateral (R>>L) multifocal infiltrates.  However surprisingly he appears relatively well.  He has no known unusual exposures or travel history.  He had been well prior to this illness however does report multifactorial weight loss over the last year of about 40 pounds but reports he is gaining weight again. 12/7-  No fevers, HIV, FLU, Resp PCR, urine strep Pna negative.  Mrsa PCR neg Still unclear etiology. I don't think this is a routine bacterial pneumonia but likely post infectious inflammation such as BOOP and would start steroids. 12/8 - no fevers, still on O2. Neg sputum culture  Recommendations He is HIV neg, no real risk for pulmonary disease or unusual contacts or travel. Unlikely to be fungal or AFB given relatively acute findings and no exposures or immunocompromise. Would suggest bronchoscopy Monday. Pending  urine Legionella antigen. Can dc vanco since MRSA PCR neg and sputum cx neg Can continue just levofloxacin at this point for atypical coverage and legionella coverage Cont prednisone 60 mg qd  Thank you very much for the consult. Will follow with you.  Leonel Ramsay   03/25/2018, 1:33 PM

## 2018-03-26 ENCOUNTER — Inpatient Hospital Stay: Payer: BLUE CROSS/BLUE SHIELD

## 2018-03-26 ENCOUNTER — Inpatient Hospital Stay (HOSPITAL_COMMUNITY)
Admit: 2018-03-26 | Discharge: 2018-03-26 | Disposition: A | Discharge status: Home / Self Care | Payer: BLUE CROSS/BLUE SHIELD | Attending: Pulmonary Disease | Admitting: Pulmonary Disease

## 2018-03-26 DIAGNOSIS — J189 Pneumonia, unspecified organism: Secondary | ICD-10-CM

## 2018-03-26 DIAGNOSIS — R918 Other nonspecific abnormal finding of lung field: Secondary | ICD-10-CM

## 2018-03-26 DIAGNOSIS — I517 Cardiomegaly: Secondary | ICD-10-CM

## 2018-03-26 DIAGNOSIS — I340 Nonrheumatic mitral (valve) insufficiency: Secondary | ICD-10-CM

## 2018-03-26 LAB — GLUCOSE, CAPILLARY
GLUCOSE-CAPILLARY: 132 mg/dL — AB (ref 70–99)
Glucose-Capillary: 242 mg/dL — ABNORMAL HIGH (ref 70–99)
Glucose-Capillary: 154 mg/dL — ABNORMAL HIGH (ref 70–99)
Glucose-Capillary: 177 mg/dL — ABNORMAL HIGH (ref 70–99)

## 2018-03-26 LAB — PROTIME-INR
INR: 1.16
Prothrombin Time: 14.7 seconds (ref 11.4–15.2)

## 2018-03-26 LAB — CBC
HCT: 48.8 % (ref 39.0–52.0)
Hemoglobin: 16.2 g/dL (ref 13.0–17.0)
MCH: 28.9 pg (ref 26.0–34.0)
MCHC: 33.2 g/dL (ref 30.0–36.0)
MCV: 87 fL (ref 80.0–100.0)
Platelets: 536 10*3/uL — ABNORMAL HIGH (ref 150–400)
RBC: 5.61 MIL/uL (ref 4.22–5.81)
RDW: 13.4 % (ref 11.5–15.5)
WBC: 23.1 10*3/uL — ABNORMAL HIGH (ref 4.0–10.5)
nRBC: 0 % (ref 0.0–0.2)

## 2018-03-26 LAB — BASIC METABOLIC PANEL
Anion gap: 10 (ref 5–15)
BUN: 22 mg/dL — ABNORMAL HIGH (ref 6–20)
CO2: 28 mmol/L (ref 22–32)
CREATININE: 1.21 mg/dL (ref 0.61–1.24)
Calcium: 9.1 mg/dL (ref 8.9–10.3)
Chloride: 98 mmol/L (ref 98–111)
GFR calc Af Amer: >60 mL/min (ref >60)
GFR calc non Af Amer: >60 mL/min (ref >60)
Glucose, Bld: 291 mg/dL — ABNORMAL HIGH (ref 70–99)
Potassium: 3.6 mmol/L (ref 3.5–5.1)
Sodium: 136 mmol/L (ref 135–145)

## 2018-03-26 LAB — CULTURE, RESPIRATORY W GRAM STAIN: Culture: NORMAL

## 2018-03-26 LAB — ECHOCARDIOGRAM COMPLETE
Height: 67 in
Weight: 3583.8 oz

## 2018-03-26 LAB — BRAIN NATRIURETIC PEPTIDE: B Natriuretic Peptide: 209 pg/mL — ABNORMAL HIGH (ref 0.0–100.0)

## 2018-03-26 LAB — APTT: aPTT: 29 seconds (ref 24–36)

## 2018-03-26 MED ORDER — DILTIAZEM HCL ER COATED BEADS 240 MG PO CP24: 240.0000 mg | ORAL_CAPSULE | Freq: Every morning | ORAL | Status: DC

## 2018-03-26 MED ORDER — INSULIN ASPART 100 UNIT/ML ~~LOC~~ SOLN
4.0000 [IU] | Freq: Three times a day (TID) | SUBCUTANEOUS | Status: DC
  Administered 2018-03-26 – 2018-03-28 (×4): 4 [IU] via SUBCUTANEOUS
  Filled 2018-03-26 (×3): qty 1

## 2018-03-26 MED ORDER — LISINOPRIL 20 MG PO TABS
20.0000 mg | ORAL_TABLET | Freq: Every day | ORAL | Status: DC
  Administered 2018-03-26 – 2018-03-28 (×3): 20 mg via ORAL
  Filled 2018-03-26 (×3): qty 1

## 2018-03-26 MED ORDER — DILTIAZEM HCL ER COATED BEADS 180 MG PO CP24: 180.0000 mg | ORAL_CAPSULE | Freq: Every morning | ORAL | Status: DC

## 2018-03-26 MED ORDER — INSULIN GLARGINE 100 UNIT/ML ~~LOC~~ SOLN
10.0000 [IU] | Freq: Every day | SUBCUTANEOUS | Status: DC
  Administered 2018-03-26 – 2018-03-28 (×3): 10 [IU] via SUBCUTANEOUS
  Filled 2018-03-26 (×3): qty 0.1

## 2018-03-26 MED ORDER — ATORVASTATIN CALCIUM 20 MG PO TABS
20.0000 mg | ORAL_TABLET | Freq: Every morning | ORAL | Status: DC
  Administered 2018-03-26 – 2018-03-27 (×2): 20 mg via ORAL
  Filled 2018-03-26 (×2): qty 1

## 2018-03-26 MED ORDER — DILTIAZEM HCL ER COATED BEADS 300 MG PO CP24
420.0000 mg | ORAL_CAPSULE | Freq: Every day | ORAL | Status: DC
  Administered 2018-03-26 – 2018-03-28 (×3): 420 mg via ORAL
  Filled 2018-03-26 (×3): qty 1

## 2018-03-26 NOTE — H&P (View-Only) (Signed)
PULMONARY HOSPITAL CONSULT NOTE  Requesting MD/Service: Ola Spurr Date of initial consultation: 03/26/2018 Reason for consultation: Extensive right lung infiltrate and patchy left lung infiltrate  PT PROFILE: 52 y.o. male never smoker initially presented to primary care physician as outpatient with dyspnea and cough.  Chest x-ray revealed infiltrate.  Treated as community-acquired pneumonia.  Failed to improve.  Admitted 03/23/2018 with extensive infiltrate and right lung.  DATA: 03/02/18 CXR (outpatient): Vague bibasilar infiltrates 03/23/18 CXR (admission): Extensive infiltrate throughout all lobes on right.  Patchy infiltrate on left.  Borderline cardiomegaly 03/23/18 CT chest: Extensive airspace disease throughout on right with very small right pleural effusion.  Patchy infiltrates/airspace disease in LLL and to a lesser extent in LUL. 03/26/18: No significant change in pulmonary infiltrates compared to prior CXR.  Cardiac silhouette appears moderately enlarged.  INTERVAL:  HPI:  His history is summarized as above.  His initial symptoms were approximately 2 weeks prior to admission.  They included orthopnea, cough when in a supine position.  His cough was productive of "phlegm".  He denies purulent sputum and hemoptysis.  He had no pleuritic chest pain.  He really reported no significant exertional dyspnea.  Only orthopnea at the time of his presentation.  He was seen by his primary care physician and prescribed a course of Augmentin and azithromycin.  He indicates that he took these medications as prescribed.  He continued to work (as a Administrator).  Over the next couple of weeks he noticed progressive exertional dyspnea and worsening cough, still productive of "phlegm".  Ultimately, he was admitted on 03/23/2018 with a diagnosis of pneumonia.  He was initially treated with vancomycin and cefepime.  He is presently on cefepime and levofloxacin.  He states that he feels better now.  He is not  on supplemental oxygen but his room air oxygen saturations are 90 and 91%.  He feels that his exertional tolerance is improved.  He is able to ambulate in the halls reasonably comfortably.  He still has cough productive of clear to white mucus.  He believes that this is coming from his throat.  He denies posterior nasal drainage.  He is never had similar illness in the past.  He is never been diagnosed with any pulmonary problems in the past.  Past Medical History:  Diagnosis Date  . Concussion 2017   cause of seizure after hit head at work  . Diabetes mellitus without complication ([place])   . Difficult intubation    patient unaware of this diagnosis  . Hypothyroidism   . Seizures ([place]) 05-20-15   X 1-PT HIT HIS HEAD AT WORK AND THEN HAD A SEIZURE    Past Surgical History:  Procedure Laterality Date  . HERNIA REPAIR     as a child,umbilical  . SHOULDER ARTHROSCOPY WITH ROTATOR CUFF REPAIR AND SUBACROMIAL DECOMPRESSION Left 09/09/2015   Procedure: SHOULDER ARTHROSCOPY, SUBACROMIAL DECOMPRESSION,BICEPS TENSON RELEASE AND MINI OPEN ROTATOR CUFF REPAIR;  Surgeon: Leanor Kail, MD;  Location: [facility] ORS;  Service: Orthopedics;  Laterality: Left;  Marland Kitchen VENTRAL HERNIA REPAIR N/A 10/27/2017   Procedure: HERNIA REPAIR VENTRAL ADULT;  Surgeon: Jules Husbands, MD;  Location: [facility] ORS;  Service: General;  Laterality: N/A;    MEDICATIONS: I have reviewed all medications and confirmed regimen as documented  Social History   Socioeconomic History  . Marital status: Single    Spouse name: Not on file  . Number of children: Not on file  . Years of education: Not on file  . Highest  education level: Not on file  Occupational History  . Not on file  Social Needs  . Financial resource strain: Not on file  . Food insecurity:    Worry: Not on file    Inability: Not on file  . Transportation needs:    Medical: Not on file    Non-medical: Not on file  Tobacco Use  . Smoking status: Never Smoker  .  Smokeless tobacco: Current User    Types: Chew  Substance and Sexual Activity  . Alcohol use: Yes    Comment: occasionally. rarely  . Drug use: No  . Sexual activity: Yes  Lifestyle  . Physical activity:    Days per week: Not on file    Minutes per session: Not on file  . Stress: Not on file  Relationships  . Social connections:    Talks on phone: Not on file    Gets together: Not on file    Attends religious service: Not on file    Active member of club or organization: Not on file    Attends meetings of clubs or organizations: Not on file    Relationship status: Not on file  . Intimate partner violence:    Fear of current or ex partner: Not on file    Emotionally abused: Not on file    Physically abused: Not on file    Forced sexual activity: Not on file  Other Topics Concern  . Not on file  Social History Narrative  . Not on file    Family History  Problem Relation Age of Onset  . Hypertension Mother   . Cancer Father   . Diabetes Sister   . Thyroid cancer Sister     ROS: No documented or subjective fevers, myalgias/arthralgias, unexplained weight loss or weight gain No new focal weakness or sensory deficits No otalgia, hearing loss, visual changes, nasal and sinus symptoms, mouth and throat problems No neck pain or adenopathy No abdominal pain, N/V/D, diarrhea, change in bowel pattern No dysuria, change in urinary pattern    Vitals:   03/25/18 2009 03/26/18 0408 03/26/18 0750 03/26/18 0840  BP: (!) 147/88 (!) 151/84 132/88   Pulse: (!) 107 (!) 103 94 100  Resp:  20  (!) 24  Temp: 98.9 F (37.2 C) 98.5 F (36.9 C) 98.2 F (36.8 C)   TempSrc: Oral Oral Oral   SpO2: 90% 92% 92% 91%  Weight:      Height:      Room air   EXAM:  Gen: WDWN, No overt respiratory distress HEENT: NCAT, sclera white, oropharynx normal Neck: Supple without LAN, thyromegaly, JVD Lungs: breath sounds slightly coarse with R >L scattered rhonchi, percussion note is normal  throughout, no wheezes, no bronchial breath sounds Cardiovascular: RRR, no murmurs noted Abdomen: Soft, nontender, normal BS Ext: without clubbing, cyanosis, edema Neuro: CNs grossly intact, motor and sensory intact Skin: Limited exam, no lesions noted  DATA:   BMP Latest Ref Rng & Units 03/26/2018 03/25/2018 03/23/2018  Glucose 70 - 99 mg/dL 291(H) 179(H) -  BUN 6 - 20 mg/dL 22(H) 15 -  Creatinine 0.61 - 1.24 mg/dL 1.21 0.96 1.00  BUN/Creat Ratio 6 - 22 (calc) - - -  Sodium 135 - 145 mmol/L 136 137 -  Potassium 3.5 - 5.1 mmol/L 3.6 3.4(L) -  Chloride 98 - 111 mmol/L 98 99 -  CO2 22 - 32 mmol/L 28 30 -  Calcium 8.9 - 10.3 mg/dL 9.1 8.5(L) -  CBC Latest Ref Rng & Units 03/26/2018 03/25/2018 03/23/2018  WBC 4.0 - 10.5 K/uL 23.1(H) 15.3(H) 10.0  Hemoglobin 13.0 - 17.0 g/dL 16.2 15.3 15.6  Hematocrit 39.0 - 52.0 % 48.8 46.5 47.7  Platelets 150 - 400 K/uL 536(H) 444(H) 398    CXR: As documented above  I have personally reviewed all chest radiographs reported above including CXRs and CT chest unless otherwise indicated  IMPRESSION:   Asymmetric bilateral pulmonary infiltrates, far more prominent on R >L Cardiomegaly Mildly elevated troponin I  DISCUSSION: His radiograph is highly suggestive of an infectious process, community acquired pneumonia.  Radiographic resolution can take several weeks after CAP and often lags behind clinical improvement.  He does feel like he is better now than on admission.  However, he wishes to have a more definitive diagnosis.  We discussed bronchoscopy for BAL and transbuccal biopsies.  He wishes to proceed.   PLAN:  BNP ordered to screen for congestive heart failure.  If significantly abnormal, would obtain echocardiogram. Coags ordered in anticipation of transbronchial biopsy Plan bronchoscopy with BAL and TBBx 03/27/2018  Merton Border, MD PCCM service Mobile [phone] Pager [phone] 03/26/2018 12:20 PM

## 2018-03-26 NOTE — Progress Notes (Signed)
Inpatient Diabetes Program Recommendations  AACE/ADA: New Consensus Statement on Inpatient Glycemic Control (2015)  Target Ranges:  Prepandial:   less than 140 mg/dL      Peak postprandial:   less than 180 mg/dL (1-2 hours)      Critically ill patients:  140 - 180 mg/dL   Results for Shane Frazier, Shane Frazier (MRN [id number]) as of 03/26/2018 10:21  Ref. Range 03/25/2018 08:07 03/25/2018 11:29 03/25/2018 16:36 03/25/2018 21:12  Glucose-Capillary Latest Ref Range: 70 - 99 mg/dL 212 (H)  5 units NOVOLOG  280 (H)  8 units NOVOLOG  191 (H)  3 units NOVOLOG  309 (H)  4 units NOVOLOG    Results for Shane Frazier, Shane Frazier (MRN [id number]) as of 03/26/2018 10:21  Ref. Range 03/26/2018 07:47  Glucose-Capillary Latest Ref Range: 70 - 99 mg/dL 242 (H)  5 units NOVOLOG    Results for Shane Frazier, Shane Frazier (MRN [id number]) as of 03/26/2018 10:21  Ref. Range 03/05/2018 08:04  Hemoglobin A1C Latest Ref Range: <5.7 % of total Hgb 7.8 (H)    Admit with: Bilateral Pneumonia  History: DM  Home DM Meds: Amaryl 4 mg Daily      Metformin 500 mg BID  Current Orders: Novolog Moderate Correction Scale/ SSI (0-15 units) TID AC + HS      Amaryl 4 mg daily     Getting Solumedrol 60 mg Daily (dose reduced yesterday).  CBGs >200 mg/dl.     MD- Please consider the following in-hospital insulin adjustments while patient remains on IV steroids:  1. Start Lantus 10 units QHS (0.1 units/kg dosing based on weight of 101 kg)  2. Start Novolog Meal Coverage: Novolog 4 units TID with meals  (Please add the following Hold Parameters: Hold if pt eats <50% of meal, Hold if pt NPO)      --Will follow patient during hospitalization--  Wyn Quaker RN, MSN, CDE Diabetes Coordinator Inpatient Glycemic Control Team Team Pager: [phone] (8a-5p)

## 2018-03-26 NOTE — Consult Note (Signed)
PULMONARY HOSPITAL CONSULT NOTE  Requesting MD/Service: Fitzgerald Date of initial consultation: 03/26/2018 Reason for consultation: Extensive right lung infiltrate and patchy left lung infiltrate  PT PROFILE: 52 y.o. male never smoker initially presented to primary care physician as outpatient with dyspnea and cough.  Chest x-ray revealed infiltrate.  Treated as community-acquired pneumonia.  Failed to improve.  Admitted 03/23/2018 with extensive infiltrate and right lung.  DATA: 03/02/18 CXR (outpatient): Vague bibasilar infiltrates 03/23/18 CXR (admission): Extensive infiltrate throughout all lobes on right.  Patchy infiltrate on left.  Borderline cardiomegaly 03/23/18 CT chest: Extensive airspace disease throughout on right with very small right pleural effusion.  Patchy infiltrates/airspace disease in LLL and to a lesser extent in LUL. 03/26/18: No significant change in pulmonary infiltrates compared to prior CXR.  Cardiac silhouette appears moderately enlarged.  INTERVAL:  HPI:  His history is summarized as above.  His initial symptoms were approximately 2 weeks prior to admission.  They included orthopnea, cough when in a supine position.  His cough was productive of "phlegm".  He denies purulent sputum and hemoptysis.  He had no pleuritic chest pain.  He really reported no significant exertional dyspnea.  Only orthopnea at the time of his presentation.  He was seen by his primary care physician and prescribed a course of Augmentin and azithromycin.  He indicates that he took these medications as prescribed.  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  Over the next couple of weeks he noticed progressive exertional dyspnea and worsening cough, still productive of "phlegm".  Ultimately, he was admitted on 03/23/2018 with a diagnosis of pneumonia.  He was initially treated with vancomycin and cefepime.  He is presently on cefepime and levofloxacin.  He states that he feels better now.  He is not  on supplemental oxygen but his room air oxygen saturations are 90 and 91%.  He feels that his exertional tolerance is improved.  He is able to ambulate in the halls reasonably comfortably.  He still has cough productive of clear to white mucus.  He believes that this is coming from his throat.  He denies posterior nasal drainage.  He is never had similar illness in the past.  He is never been diagnosed with any pulmonary problems in the past.  Past Medical History:  Diagnosis Date  . Concussion 2017   cause of seizure after hit head at work  . Diabetes mellitus without complication (HCC)   . Difficult intubation    patient unaware of this diagnosis  . Hypothyroidism   . Seizures (HCC) 05-20-15   X 1-PT HIT HIS HEAD AT WORK AND THEN HAD A SEIZURE    Past Surgical History:  Procedure Laterality Date  . HERNIA REPAIR     as a child,umbilical  . SHOULDER ARTHROSCOPY WITH ROTATOR CUFF REPAIR AND SUBACROMIAL DECOMPRESSION Left 09/09/2015   Procedure: SHOULDER ARTHROSCOPY, SUBACROMIAL DECOMPRESSION,BICEPS TENSON RELEASE AND MINI OPEN ROTATOR CUFF REPAIR;  Surgeon: Harold Kernodle, MD;  Location: [facility] ORS;  Service: Orthopedics;  Laterality: Left;  . VENTRAL HERNIA REPAIR N/A 10/27/2017   Procedure: HERNIA REPAIR VENTRAL ADULT;  Surgeon: Pabon, Diego F, MD;  Location: [facility] ORS;  Service: General;  Laterality: N/A;    MEDICATIONS: I have reviewed all medications and confirmed regimen as documented  Social History   Socioeconomic History  . Marital status: Single    Spouse name: Not on file  . Number of children: Not on file  . Years of education: Not on file  . Highest   education level: Not on file  Occupational History  . Not on file  Social Needs  . Financial resource strain: Not on file  . Food insecurity:    Worry: Not on file    Inability: Not on file  . Transportation needs:    Medical: Not on file    Non-medical: Not on file  Tobacco Use  . Smoking status: Never Smoker  .  Smokeless tobacco: Current User    Types: Chew  Substance and Sexual Activity  . Alcohol use: Yes    Comment: occasionally. rarely  . Drug use: No  . Sexual activity: Yes  Lifestyle  . Physical activity:    Days per week: Not on file    Minutes per session: Not on file  . Stress: Not on file  Relationships  . Social connections:    Talks on phone: Not on file    Gets together: Not on file    Attends religious service: Not on file    Active member of club or organization: Not on file    Attends meetings of clubs or organizations: Not on file    Relationship status: Not on file  . Intimate partner violence:    Fear of current or ex partner: Not on file    Emotionally abused: Not on file    Physically abused: Not on file    Forced sexual activity: Not on file  Other Topics Concern  . Not on file  Social History Narrative  . Not on file    Family History  Problem Relation Age of Onset  . Hypertension Mother   . Cancer Father   . Diabetes Sister   . Thyroid cancer Sister     ROS: No documented or subjective fevers, myalgias/arthralgias, unexplained weight loss or weight gain No new focal weakness or sensory deficits No otalgia, hearing loss, visual changes, nasal and sinus symptoms, mouth and throat problems No neck pain or adenopathy No abdominal pain, N/V/D, diarrhea, change in bowel pattern No dysuria, change in urinary pattern    Vitals:   03/25/18 2009 03/26/18 0408 03/26/18 0750 03/26/18 0840  BP: (!) 147/88 (!) 151/84 132/88   Pulse: (!) 107 (!) 103 94 100  Resp:  20  (!) 24  Temp: 98.9 F (37.2 C) 98.5 F (36.9 C) 98.2 F (36.8 C)   TempSrc: Oral Oral Oral   SpO2: 90% 92% 92% 91%  Weight:      Height:      Room air   EXAM:  Gen: WDWN, No overt respiratory distress HEENT: NCAT, sclera white, oropharynx normal Neck: Supple without LAN, thyromegaly, JVD Lungs: breath sounds slightly coarse with R >L scattered rhonchi, percussion note is normal  throughout, no wheezes, no bronchial breath sounds Cardiovascular: RRR, no murmurs noted Abdomen: Soft, nontender, normal BS Ext: without clubbing, cyanosis, edema Neuro: CNs grossly intact, motor and sensory intact Skin: Limited exam, no lesions noted  DATA:   BMP Latest Ref Rng & Units 03/26/2018 03/25/2018 03/23/2018  Glucose 70 - 99 mg/dL 291(H) 179(H) -  BUN 6 - 20 mg/dL 22(H) 15 -  Creatinine 0.61 - 1.24 mg/dL 1.21 0.96 1.00  BUN/Creat Ratio 6 - 22 (calc) - - -  Sodium 135 - 145 mmol/L 136 137 -  Potassium 3.5 - 5.1 mmol/L 3.6 3.4(L) -  Chloride 98 - 111 mmol/L 98 99 -  CO2 22 - 32 mmol/L 28 30 -  Calcium 8.9 - 10.3 mg/dL 9.1 8.5(L) -  CBC Latest Ref Rng & Units 03/26/2018 03/25/2018 03/23/2018  WBC 4.0 - 10.5 K/uL 23.1(H) 15.3(H) 10.0  Hemoglobin 13.0 - 17.0 g/dL 16.2 15.3 15.6  Hematocrit 39.0 - 52.0 % 48.8 46.5 47.7  Platelets 150 - 400 K/uL 536(H) 444(H) 398    CXR: As documented above  I have personally reviewed all chest radiographs reported above including CXRs and CT chest unless otherwise indicated  IMPRESSION:   Asymmetric bilateral pulmonary infiltrates, far more prominent on R >L Cardiomegaly Mildly elevated troponin I  DISCUSSION: His radiograph is highly suggestive of an infectious process, community acquired pneumonia.  Radiographic resolution can take several weeks after CAP and often lags behind clinical improvement.  He does feel like he is better now than on admission.  However, he wishes to have a more definitive diagnosis.  We discussed bronchoscopy for BAL and transbuccal biopsies.  He wishes to proceed.   PLAN:  BNP ordered to screen for congestive heart failure.  If significantly abnormal, would obtain echocardiogram. Coags ordered in anticipation of transbronchial biopsy Plan bronchoscopy with BAL and TBBx 03/27/2018  Merton Border, MD PCCM service Mobile [phone] Pager [phone] 03/26/2018 12:20 PM

## 2018-03-26 NOTE — Progress Notes (Signed)
[facility] [place] NAME: Shane Frazier    MR#:  [id number]  DATE OF BIRTH:  1965-11-02  SUBJECTIVE:   Patient still with shortness of breath and cough.  Oxygen saturation decreases when he ambulates.  REVIEW OF SYSTEMS:    Review of Systems  Constitutional: Negative for fever, chills weight loss HENT: Negative for ear pain, nosebleeds, congestion, facial swelling, rhinorrhea, neck pain, neck stiffness and ear discharge.   Respiratory: ++ for cough, shortness of breath, no wheezing  Cardiovascular: Negative for chest pain, palpitations and leg swelling.  Gastrointestinal: Negative for heartburn, abdominal pain, vomiting, diarrhea or consitpation Genitourinary: Negative for dysuria, urgency, frequency, hematuria Musculoskeletal: Negative for back pain or joint pain Neurological: Negative for dizziness, seizures, syncope, focal weakness,  numbness and headaches.  Hematological: Does not bruise/bleed easily.  Psychiatric/Behavioral: Negative for hallucinations, confusion, dysphoric mood    Tolerating Diet:yes      DRUG ALLERGIES:   Allergies  Allergen Reactions  . Other Other (See Comments)    BRAZILIAN NUTS ONLY-"THROAT CLOSES UP"    VITALS:  Blood pressure 132/88, pulse 100, temperature 98.2 F (36.8 C), temperature source Oral, resp. rate (!) 24, height 5\' 7"  (1.702 m), weight 101.6 kg, SpO2 91 %.  PHYSICAL EXAMINATION:  Constitutional: Appears well-developed and well-nourished. No distress. HENT: Normocephalic. Marland Kitchen Oropharynx is clear and moist.  Eyes: Conjunctivae and EOM are normal. PERRLA, no scleral icterus.  Neck: Normal ROM. Neck supple. No JVD. No tracheal deviation. CVS: RRR, S1/S2 +, no murmurs, no gallops, no carotid bruit.  Pulmonary: Effort and breath sounds normal, ++b/l lower lobe rhonchi, no wheezes, rales.  Abdominal: Soft. BS +,  no distension, tenderness, rebound or guarding.  Musculoskeletal: Normal range of  motion. No edema and no tenderness.  Neuro: Alert. CN 2-12 grossly intact. No focal deficits. Skin: Skin is warm and dry. No rash noted. Psychiatric: Normal mood and affect.      LABORATORY PANEL:   CBC Recent Labs  Lab 03/26/18 0442  WBC 23.1*  HGB 16.2  HCT 48.8  PLT 536*   ------------------------------------------------------------------------------------------------------------------  Chemistries  Recent Labs  Lab 03/26/18 0442  NA 136  K 3.6  CL 98  CO2 28  GLUCOSE 291*  BUN 22*  CREATININE 1.21  CALCIUM 9.1   ------------------------------------------------------------------------------------------------------------------  Cardiac Enzymes Recent Labs  Lab 03/23/18 1523 03/23/18 2048 03/24/18 0243  TROPONINI 0.12* 0.12* 0.12*   ------------------------------------------------------------------------------------------------------------------  RADIOLOGY:  Dg Chest 1 View  Result Date: 03/26/2018 CLINICAL DATA:  Pneumonia EXAM: CHEST  1 VIEW COMPARISON:  03/25/2018 FINDINGS: Diffuse right lung airspace disease and patchy opacities in the left mid and lower lung, stable. Cardiomegaly. No effusions. No acute bony abnormality. IMPRESSION: Bilateral airspace opacities, right greater than left are stable since prior study, most compatible with pneumonia. Electronically Signed   By: Rolm Baptise M.D.   On: 03/26/2018 07:46   Dg Chest 2 View  Result Date: 03/25/2018 CLINICAL DATA:  Pneumonia. EXAM: CHEST - 2 VIEW COMPARISON:  Chest x-ray and chest CT 03/23/2018 FINDINGS: The heart is borderline enlarged but stable. The mediastinal and hilar contours are unchanged. Persistent bilateral airspace disease most notably in the right upper lobe and consistent with multifocal pneumonia. No definite pleural effusions or pneumothorax. IMPRESSION: Persistent bilateral infiltrates. Electronically Signed   By: Marijo Sanes M.D.   On: 03/25/2018 10:36     ASSESSMENT AND PLAN:    52 year old male with poorly controlled diabetes and hypothyroidism who presented  to the ER due to shortness of breath.  1.  Community-acquired pneumonia: CT chest shows extensive multilobar pneumonia without evidence of pulmonary emboli  patient has been treated with Augmentin, azithromycin and Levaquin without complete resolution of his symptoms.   Patient evaluated by Dr. Ola Spurr.   Continue cefepime and Levaquin. Pulmonary consultation placed via epic Dr. Ashby Dawes to see patient this afternoon for bronchoscopy Continue steroids for possible inflammatory/ILD etiology Pulmonary consultation to be placed tomorrow for possible bronchoscopy Respiratory viral panel and influenza are negative. Follow-up on urine Legionella and strep pneumoniae antigen HIV nonreactive Respiratory culture for gram-positive cocci and rods Elevated white blood cell count due to steroids.  2.  Elevated troponin: This is due to demand ischemia.  Patient has ruled out for ACS.  Troponins are flat.    3.  Hypokalemia: Replete PRN  4.  Diabetes: Continue Amaryl with ADA diet and sliding scale/Lantus as per recommendations by diabetes consultant.  5.  Essential hypertension: Continue lisinopril, diltiazem and HCTZ  6.  Hypothyroidism: Continue Synthroid  7. Tobacco dependence: Patient is encouraged to quit chewing tobacco. Counseling was provided.   Management plans discussed with the patient and he is in agreement.  CODE STATUS: FULL  TOTAL TIME TAKING CARE OF THIS PATIENT: 24 minutes.    D/w dr fitzgerald   POSSIBLE D/C 2-4 days, DEPENDING ON CLINICAL CONDITION.   Naiomi Musto M.D on 03/26/2018 at 11:36 AM  Between 7am to 6pm - Pager - [phone] After 6pm go to [email or web address] - password [email or web address] [facility]  Office  [phone]  CC: Primary care physician; Olin Hauser, DO  Note: This dictation was prepared with Dragon dictation along with smaller  phrase technology. Any transcriptional errors that result from this process are unintentional.

## 2018-03-26 NOTE — Progress Notes (Signed)
*  PRELIMINARY RESULTS* Echocardiogram 2D Echocardiogram has been performed.  Shane Frazier 03/26/2018, 1:52 PM

## 2018-03-27 ENCOUNTER — Encounter: Payer: Self-pay | Admitting: *Deleted

## 2018-03-27 ENCOUNTER — Inpatient Hospital Stay: Payer: BLUE CROSS/BLUE SHIELD

## 2018-03-27 ENCOUNTER — Encounter
Admission: EM | Disposition: A | Discharge status: Home / Self Care | Payer: Self-pay | Source: Ambulatory Visit | Attending: Internal Medicine

## 2018-03-27 HISTORY — PX: FLEXIBLE BRONCHOSCOPY: SHX5094

## 2018-03-27 LAB — CBC
HCT: 41.3 % (ref 39.0–52.0)
Hemoglobin: 13.5 g/dL (ref 13.0–17.0)
MCH: 28.9 pg (ref 26.0–34.0)
MCHC: 32.7 g/dL (ref 30.0–36.0)
MCV: 88.4 fL (ref 80.0–100.0)
Platelets: 468 10*3/uL — ABNORMAL HIGH (ref 150–400)
RBC: 4.67 MIL/uL (ref 4.22–5.81)
RDW: 13.5 % (ref 11.5–15.5)
WBC: 19.4 10*3/uL — ABNORMAL HIGH (ref 4.0–10.5)
nRBC: 0 % (ref 0.0–0.2)

## 2018-03-27 LAB — GLUCOSE, CAPILLARY
GLUCOSE-CAPILLARY: 78 mg/dL (ref 70–99)
GLUCOSE-CAPILLARY: 91 mg/dL (ref 70–99)
GLUCOSE-CAPILLARY: 162 mg/dL — AB (ref 70–99)
Glucose-Capillary: 166 mg/dL — ABNORMAL HIGH (ref 70–99)
Glucose-Capillary: 70 mg/dL (ref 70–99)
Glucose-Capillary: 98 mg/dL (ref 70–99)
Glucose-Capillary: 183 mg/dL — ABNORMAL HIGH (ref 70–99)

## 2018-03-27 LAB — BASIC METABOLIC PANEL
Anion gap: 5 (ref 5–15)
BUN: 25 mg/dL — ABNORMAL HIGH (ref 6–20)
CO2: 30 mmol/L (ref 22–32)
Calcium: 8.5 mg/dL — ABNORMAL LOW (ref 8.9–10.3)
Chloride: 100 mmol/L (ref 98–111)
Creatinine, Ser: 1.34 mg/dL — ABNORMAL HIGH (ref 0.61–1.24)
GFR calc Af Amer: >60 mL/min (ref >60)
GFR calc non Af Amer: >60 mL/min (ref >60)
Glucose, Bld: 324 mg/dL — ABNORMAL HIGH (ref 70–99)
Potassium: 3.9 mmol/L (ref 3.5–5.1)
Sodium: 135 mmol/L (ref 135–145)

## 2018-03-27 LAB — KOH PREP: KOH Prep: NONE SEEN

## 2018-03-27 SURGERY — BRONCHOSCOPY, FLEXIBLE
Anesthesia: Moderate Sedation

## 2018-03-27 MED ORDER — FENTANYL CITRATE (PF) 100 MCG/2ML IJ SOLN
INTRAMUSCULAR | Status: DC | PRN
  Administered 2018-03-27 (×2): 25 ug via INTRAVENOUS
  Administered 2018-03-27: 50 ug via INTRAVENOUS

## 2018-03-27 MED ORDER — LIDOCAINE HCL URETHRAL/MUCOSAL 2 % EX GEL
1.0000 "application " | Freq: Once | CUTANEOUS | Status: DC
  Filled 2018-03-27: qty 30

## 2018-03-27 MED ORDER — DEXTROSE 50 % IV SOLN
1.0000 | Freq: Once | INTRAVENOUS | Status: AC
  Administered 2018-03-27: 50 mL via INTRAVENOUS

## 2018-03-27 MED ORDER — FENTANYL CITRATE (PF) 100 MCG/2ML IJ SOLN
INTRAMUSCULAR | Status: AC
  Filled 2018-03-27: qty 6

## 2018-03-27 MED ORDER — MIDAZOLAM HCL 2 MG/2ML IJ SOLN
INTRAMUSCULAR | Status: DC | PRN
  Administered 2018-03-27: 4 mg via INTRAVENOUS

## 2018-03-27 MED ORDER — SODIUM CHLORIDE FLUSH 0.9 % IV SOLN
INTRAVENOUS | Status: AC
  Filled 2018-03-27: qty 10

## 2018-03-27 MED ORDER — MIDAZOLAM HCL 2 MG/2ML IJ SOLN
INTRAMUSCULAR | Status: AC
  Filled 2018-03-27: qty 8

## 2018-03-27 MED ORDER — DEXTROSE 50 % IV SOLN
INTRAVENOUS | Status: AC
  Administered 2018-03-27: 50 mL via INTRAVENOUS
  Filled 2018-03-27: qty 50

## 2018-03-27 MED ORDER — PHENYLEPHRINE HCL 0.25 % NA SOLN
1.0000 | Freq: Four times a day (QID) | NASAL | Status: DC | PRN
  Filled 2018-03-27: qty 15

## 2018-03-27 MED ORDER — BUTAMBEN-TETRACAINE-BENZOCAINE 2-2-14 % EX AERO
1.0000 | INHALATION_SPRAY | Freq: Once | CUTANEOUS | Status: DC
  Filled 2018-03-27: qty 20

## 2018-03-27 MED ORDER — SODIUM CHLORIDE 0.9 % IV SOLN: INTRAVENOUS | Status: DC

## 2018-03-27 NOTE — Progress Notes (Signed)
Shane Frazier at [place] NAME: Shane Frazier    MR#:  [id number]  DATE OF BIRTH:  12/09/1965  SUBJECTIVE:   Doing well this am Sob much improved Cough better  REVIEW OF SYSTEMS:    Review of Systems  Constitutional: Negative for fever, chills weight loss HENT: Negative for ear pain, nosebleeds, congestion, facial swelling, rhinorrhea, neck pain, neck stiffness and ear discharge.   Respiratory: ++ for cough, shortness of breath (BETTER) no wheezing  Cardiovascular: Negative for chest pain, palpitations and leg swelling.  Gastrointestinal: Negative for heartburn, abdominal pain, vomiting, diarrhea or consitpation Genitourinary: Negative for dysuria, urgency, frequency, hematuria Musculoskeletal: Negative for back pain or joint pain Neurological: Negative for dizziness, seizures, syncope, focal weakness,  numbness and headaches.  Hematological: Does not bruise/bleed easily.  Psychiatric/Behavioral: Negative for hallucinations, confusion, dysphoric mood    Tolerating Diet:yes      DRUG ALLERGIES:   Allergies  Allergen Reactions  . Other Other (See Comments)    BRAZILIAN NUTS ONLY-"THROAT CLOSES UP"    VITALS:  Blood pressure (!) 128/92, pulse 88, temperature 98 F (36.7 C), temperature source Oral, resp. rate (!) 22, height 5\' 7"  (1.702 m), weight 101.6 kg, SpO2 96 %.  PHYSICAL EXAMINATION:  Constitutional: Appears well-developed and well-nourished. No distress. HENT: Normocephalic. Marland Kitchen Oropharynx is clear and moist.  Eyes: Conjunctivae and EOM are normal. PERRLA, no scleral icterus.  Neck: Normal ROM. Neck supple. No JVD. No tracheal deviation. CVS: RRR, S1/S2 +, no murmurs, no gallops, no carotid bruit.  Pulmonary: Effort and breath sounds normal, no rhonchii, no wheezes, rales.  Abdominal: Soft. BS +,  no distension, tenderness, rebound or guarding.  Musculoskeletal: Normal range of motion. No edema and no tenderness.  Neuro: Alert.  CN 2-12 grossly intact. No focal deficits. Skin: Skin is warm and dry. No rash noted. Psychiatric: Normal mood and affect.      LABORATORY PANEL:   CBC Recent Labs  Lab 03/27/18 0352  WBC 19.4*  HGB 13.5  HCT 41.3  PLT 468*   ------------------------------------------------------------------------------------------------------------------  Chemistries  Recent Labs  Lab 03/27/18 0352  NA 135  K 3.9  CL 100  CO2 30  GLUCOSE 324*  BUN 25*  CREATININE 1.34*  CALCIUM 8.5*   ------------------------------------------------------------------------------------------------------------------  Cardiac Enzymes Recent Labs  Lab 03/23/18 1523 03/23/18 2048 03/24/18 0243  TROPONINI 0.12* 0.12* 0.12*   ------------------------------------------------------------------------------------------------------------------  RADIOLOGY:  Dg Chest 1 View  Result Date: 03/26/2018 CLINICAL DATA:  Pneumonia EXAM: CHEST  1 VIEW COMPARISON:  03/25/2018 FINDINGS: Diffuse right lung airspace disease and patchy opacities in the left mid and lower lung, stable. Cardiomegaly. No effusions. No acute bony abnormality. IMPRESSION: Bilateral airspace opacities, right greater than left are stable since prior study, most compatible with pneumonia. Electronically Signed   By: Rolm Baptise M.D.   On: 03/26/2018 07:46     ASSESSMENT AND PLAN:   52 year old male with poorly controlled diabetes and hypothyroidism who presented to the ER due to shortness of breath.  1.  Acute hypoxic resp failure due to Community-acquired pneumonia: CT chest shows extensive multilobar pneumonia without evidence of pulmonary emboli  patient has been treated with Augmentin, azithromycin and Levaquin without complete resolution of his symptoms.   Patient evaluated by Dr. Ola Spurr.   Continue cefepime and Levaquin. Pulmonary consultation placed via epic Plan for bronchoscopy today Follow up on ECHO results HIV  nonreactive Respiratory culture for gram-positive cocci and rods Elevated white blood cell count due  to steroids (IMPROVING). On steroids empirically for concern possible ILD   2.  Elevated troponin: This is due to demand ischemia.  Patient has ruled out for ACS.  Troponins are flat.    3.  Hypokalemia: Replete PRN  4.  Diabetes: Continue Amaryl with ADA diet and sliding scale/Lantus  5.  Essential hypertension: Continue lisinopril, diltiazem and HCTZ  6.  Hypothyroidism: Continue Synthroid  7. Tobacco dependence: Patient is encouraged to quit chewing tobacco. Counseling was provided.   Management plans discussed with the patient and he is in agreement.  CODE STATUS: FULL  TOTAL TIME TAKING CARE OF THIS PATIENT: 25 minutes.      POSSIBLE D/C tomorrow, DEPENDING ON CLINICAL CONDITION.   Fuquan Wilson M.D on 03/27/2018 at 10:48 AM  Between 7am to 6pm - Pager - [phone] After 6pm go to [email or web address] - password [email or web address] [facility]  Office  [phone]  CC: Primary care physician; Olin Hauser, DO  Note: This dictation was prepared with Dragon dictation along with smaller phrase technology. Any transcriptional errors that result from this process are unintentional.

## 2018-03-27 NOTE — Procedures (Signed)
Indication:   Pulmonary infiltrates  Premedication:   Fentanyl 50+25 mcg Midaz 4 mg  Anesthesia: Topical to nose and throat 40 cc of 1% lidocaine used during the course of procedure  Procedure: After adequate sedation and anesthesia, the bronchoscope was introduced via the L naris and advanced into the posterior pharynx. Further anesthesia was obtained with 1% lidocaine and the scope was advanced into the trachea. Complete airway anesthesia was achieved with 1% lidocaine and a thorough airway examination was performed. This revealed the following findings:  Findings:  Upper airway - normal. Cords moved bilaterally Tracheobronchial anatomy - normal Bronchial mucosa - Severe acute and chronic bronchitis Other - no endobronchial masses, tumors or foreign bodies  Specimens:   BAL from RML. Effluent cloudy and blood tinged Cytology brushings from RLL  TBBx was not attempted due to excessive cough  Complications: Excessive cough.  Post procedure evaluation: post procedure check. CXR not indicated   Merton Border, MD PCCM service Mobile [phone] Pager [phone]  03/27/2018 1:54 PM

## 2018-03-27 NOTE — Interval H&P Note (Signed)
History and Physical Interval Note:  03/27/2018 1:26 PM  Shane Frazier.  has presented today for surgery, with the diagnosis of Pulmonary infiltrates  The various methods of treatment have been discussed with the patient and family. After consideration of risks, benefits and other options for treatment, the patient has consented to  Procedure(s): [place] With C-arm (N/A) as a surgical intervention .  The patient's history has been reviewed, patient examined, no change in status, stable for surgery.  I have reviewed the patient's chart and labs.  Questions were answered to the patient's satisfaction.     [name]

## 2018-03-27 NOTE — Plan of Care (Signed)
  Problem: Education: Goal: Knowledge of General Education information will improve Description Including pain rating scale, medication(s)/side effects and non-pharmacologic comfort measures Outcome: Progressing   Problem: Health Behavior/Discharge Planning: Goal: Ability to manage health-related needs will improve Outcome: Progressing   Problem: Clinical Measurements: Goal: Ability to maintain clinical measurements within normal limits will improve Outcome: Progressing Goal: Will remain free from infection Outcome: Progressing Goal: Diagnostic test results will improve Outcome: Progressing Goal: Respiratory complications will improve Outcome: Progressing Goal: Cardiovascular complication will be avoided Outcome: Progressing   Problem: Activity: Goal: Risk for activity intolerance will decrease Outcome: Progressing   Problem: Pain Managment: Goal: General experience of comfort will improve Outcome: Progressing   Problem: Safety: Goal: Ability to remain free from injury will improve Outcome: Progressing   Problem: Respiratory: Goal: Ability to maintain adequate ventilation will improve Outcome: Progressing Goal: Ability to maintain a clear airway will improve Outcome: Progressing

## 2018-03-28 ENCOUNTER — Encounter: Payer: Self-pay | Admitting: Internal Medicine

## 2018-03-28 LAB — ACID FAST SMEAR (AFB, MYCOBACTERIA)
Acid Fast Smear: NEGATIVE
Acid Fast Smear: NEGATIVE

## 2018-03-28 LAB — GLUCOSE, CAPILLARY
Glucose-Capillary: 194 mg/dL — ABNORMAL HIGH (ref 70–99)
Glucose-Capillary: 79 mg/dL (ref 70–99)

## 2018-03-28 LAB — CYTOLOGY - NON PAP

## 2018-03-28 LAB — CULTURE, BLOOD (ROUTINE X 2)
Culture: NO GROWTH
SPECIAL REQUESTS: ADEQUATE

## 2018-03-28 LAB — ADENOVIRUS ANTIBODIES: Adenovirus Antibody: 1:16 {titer} — ABNORMAL HIGH

## 2018-03-28 MED ORDER — NICOTINE 14 MG/24HR TD PT24: 14.0000 mg | MEDICATED_PATCH | Freq: Every day | TRANSDERMAL | 0 refills | Status: DC

## 2018-03-28 MED ORDER — FUROSEMIDE 40 MG PO TABS
40.0000 mg | ORAL_TABLET | Freq: Every day | ORAL | Status: DC
  Administered 2018-03-28: 40 mg via ORAL
  Filled 2018-03-28: qty 1

## 2018-03-28 NOTE — Discharge Summary (Signed)
[facility] NAME: Shane Frazier    MR#:  [id number]  DATE OF BIRTH:  April 13, 1966  DATE OF ADMISSION:  03/23/2018 ADMITTING PHYSICIAN: Nicholes Mango, MD  DATE OF DISCHARGE: 03/28/2018  PRIMARY CARE PHYSICIAN: Olin Hauser, DO    ADMISSION DIAGNOSIS:  Shortness of breath [R06.02] Elevated troponin [R79.89] Failure of outpatient treatment [Z78.9] Multifocal pneumonia [J18.9] Type 2 diabetes mellitus without complication, unspecified whether long term insulin use ([place]) [E11.9]  DISCHARGE DIAGNOSIS:  Active Problems:   Multifocal pneumonia   Failure of outpatient treatment   Shortness of breath   SECONDARY DIAGNOSIS:   Past Medical History:  Diagnosis Date  . CHF (congestive heart failure), NYHA class I, acute on chronic, systolic ([place])   . Concussion 2017   cause of seizure after hit head at work  . Diabetes mellitus without complication ([place])   . Difficult intubation    patient unaware of this diagnosis  . Hypothyroidism   . Seizures ([place]) 05-20-15   X 1-PT HIT HIS HEAD AT WORK AND THEN HAD A SEIZURE    HOSPITAL COURSE:   52 year old male with poorly controlled diabetes and hypothyroidism who presented to the ER due to shortness of breath.  1.  Acute hypoxic resp failure due to Community-acquired pneumonia: CT chest showed extensive multilobar pneumonia without evidence of pulmonary emboli  patient has been treated with Augmentin, azithromycin and Levaquin without complete resolution of his symptoms.   He was treated with Cefepime and Levaquin. He underwent bronchoscopy and will have outpatient follow up with Dr simonds. He does not need antibiotics at discharge per ID consultant.   2.  Elevated troponin: This is due to demand ischemia.  Patient has ruled out for ACS.  Troponins are flat.  He had no chest pain or EKG changes  3.  Hypokalemia: Replete PRN  4.  Diabetes: Continue Amaryl with ADA diet and  sliding scale/Lantus  5.  Essential hypertension: Continue lisinopril, diltiazem and HCTZ  6.  Hypothyroidism: Continue Synthroid  7. Tobacco dependence: Patient is encouraged to quit chewing tobacco. Counseling was provided  8. New diagnosis of systolic cardiomyopathy EF 40-45%: He will be referred to Cardiology for further workup and medications.   DISCHARGE CONDITIONS AND DIET:   Stable Heart healthy diabetic  CONSULTS OBTAINED:    DRUG ALLERGIES:   Allergies  Allergen Reactions  . Other Other (See Comments)    BRAZILIAN NUTS ONLY-"THROAT CLOSES UP"    DISCHARGE MEDICATIONS:   Allergies as of 03/28/2018      Reactions   Other Other (See Comments)   BRAZILIAN NUTS ONLY-"THROAT CLOSES UP"      Medication List    STOP taking these medications   levofloxacin 750 MG tablet Commonly known as:  LEVAQUIN     TAKE these medications   atorvastatin 20 MG tablet Commonly known as:  LIPITOR Take 20 mg by mouth every morning.   benzonatate 100 MG capsule Commonly known as:  TESSALON Take 1-2 capsules by mouth 3 (three) times daily as needed.   diltiazem 420 MG 24 hr tablet Commonly known as:  CARDIZEM LA Take 1 tablet (420 mg total) by mouth every morning.   glimepiride 4 MG tablet Commonly known as:  AMARYL Take 4 mg by mouth daily with breakfast.   hydrochlorothiazide 25 MG tablet Commonly known as:  HYDRODIURIL Take 25 mg by mouth daily.   levothyroxine 200 MCG tablet Commonly known as:  SYNTHROID, LEVOTHROID Take  200 mcg by mouth daily before breakfast.   lisinopril 20 MG tablet Commonly known as:  PRINIVIL,ZESTRIL Take 20 mg by mouth every morning.   metFORMIN 500 MG tablet Commonly known as:  GLUCOPHAGE Take 500 mg by mouth 2 (two) times daily with a meal.   nicotine 14 mg/24hr patch Commonly known as:  NICODERM CQ - dosed in mg/24 hours Place 1 patch (14 mg total) onto the skin daily.   PROAIR HFA 108 (90 Base) MCG/ACT inhaler Generic  drug:  albuterol Inhale 2 puffs into the lungs daily as needed.         Today   CHIEF COMPLAINT:  Doing well ready to go home today   VITAL SIGNS:  Blood pressure 129/84, pulse 86, temperature 98.1 F (36.7 C), temperature source Oral, resp. rate 20, height 5\' 7"  (1.702 m), weight 101.6 kg, SpO2 92 %.   REVIEW OF SYSTEMS:  Review of Systems  Constitutional: Negative.  Negative for chills, fever and malaise/fatigue.  HENT: Negative.  Negative for ear discharge, ear pain, hearing loss, nosebleeds and sore throat.   Eyes: Negative.  Negative for blurred vision and pain.  Respiratory: Negative.  Negative for cough, hemoptysis, shortness of breath and wheezing.   Cardiovascular: Negative.  Negative for chest pain, palpitations and leg swelling.  Gastrointestinal: Negative.  Negative for abdominal pain, blood in stool, diarrhea, nausea and vomiting.  Genitourinary: Negative.  Negative for dysuria.  Musculoskeletal: Negative.  Negative for back pain.  Skin: Negative.   Neurological: Negative for dizziness, tremors, speech change, focal weakness, seizures and headaches.  Endo/Heme/Allergies: Negative.  Does not bruise/bleed easily.  Psychiatric/Behavioral: Negative.  Negative for depression, hallucinations and suicidal ideas.     PHYSICAL EXAMINATION:  GENERAL:  52 y.o.-year-old patient lying in the bed with no acute distress.  NECK:  Supple, no jugular venous distention. No thyroid enlargement, no tenderness.  LUNGS: Normal breath sounds bilaterally, no wheezing, rales,rhonchi  No use of accessory muscles of respiration.  CARDIOVASCULAR: S1, S2 normal. No murmurs, rubs, or gallops.  ABDOMEN: Soft, non-tender, non-distended. Bowel sounds present. No organomegaly or mass.  EXTREMITIES: No pedal edema, cyanosis, or clubbing.  PSYCHIATRIC: The patient is alert and oriented x 3.  SKIN: No obvious rash, lesion, or ulcer.   DATA REVIEW:   CBC Recent Labs  Lab 03/27/18 0352  WBC  19.4*  HGB 13.5  HCT 41.3  PLT 468*    Chemistries  Recent Labs  Lab 03/27/18 0352  NA 135  K 3.9  CL 100  CO2 30  GLUCOSE 324*  BUN 25*  CREATININE 1.34*  CALCIUM 8.5*    Cardiac Enzymes Recent Labs  Lab 03/23/18 1523 03/23/18 2048 03/24/18 0243  TROPONINI 0.12* 0.12* 0.12*    Microbiology Results  @MICRORSLT48 @  RADIOLOGY:  No results found.    Allergies as of 03/28/2018      Reactions   Other Other (See Comments)   BRAZILIAN NUTS ONLY-"THROAT CLOSES UP"      Medication List    STOP taking these medications   levofloxacin 750 MG tablet Commonly known as:  LEVAQUIN     TAKE these medications   atorvastatin 20 MG tablet Commonly known as:  LIPITOR Take 20 mg by mouth every morning.   benzonatate 100 MG capsule Commonly known as:  TESSALON Take 1-2 capsules by mouth 3 (three) times daily as needed.   diltiazem 420 MG 24 hr tablet Commonly known as:  CARDIZEM LA Take 1 tablet (420 mg total) by  mouth every morning.   glimepiride 4 MG tablet Commonly known as:  AMARYL Take 4 mg by mouth daily with breakfast.   hydrochlorothiazide 25 MG tablet Commonly known as:  HYDRODIURIL Take 25 mg by mouth daily.   levothyroxine 200 MCG tablet Commonly known as:  SYNTHROID, LEVOTHROID Take 200 mcg by mouth daily before breakfast.   lisinopril 20 MG tablet Commonly known as:  PRINIVIL,ZESTRIL Take 20 mg by mouth every morning.   metFORMIN 500 MG tablet Commonly known as:  GLUCOPHAGE Take 500 mg by mouth 2 (two) times daily with a meal.   nicotine 14 mg/24hr patch Commonly known as:  NICODERM CQ - dosed in mg/24 hours Place 1 patch (14 mg total) onto the skin daily.   PROAIR HFA 108 (90 Base) MCG/ACT inhaler Generic drug:  albuterol Inhale 2 puffs into the lungs daily as needed.          Management plans discussed with the patient and he is in agreement. Stable for discharge home  Patient should follow up with dr simonds  CODE STATUS:      Code Status Orders  (From admission, onward)         Start     Ordered   03/23/18 1622  Full code  Continuous     03/23/18 1622        Code Status History    Date Active Date Inactive Code Status Order ID Comments User Context   10/27/2017 2051 10/28/2017 1520 Full Code [id number]  Jules Husbands, MD Inpatient      TOTAL TIME TAKING CARE OF THIS PATIENT: 38 minutes.    Note: This dictation was prepared with Dragon dictation along with smaller phrase technology. Any transcriptional errors that result from this process are unintentional.  Brittinie Wherley M.D on 03/28/2018 at 10:56 AM  Between 7am to 6pm - Pager - [phone] After 6pm go to [email or web address] - password [email or web address] [facility]  Office  [phone]  CC: Primary care physician; Olin Hauser, DO

## 2018-03-28 NOTE — Progress Notes (Signed)
Patient discharged home. Instructions explained and pt verbalized understanding. Patient decided not to wait for doctors note to excuse him from work.

## 2018-03-29 LAB — CULTURE, BAL-QUANTITATIVE

## 2018-03-29 LAB — CULTURE, BAL-QUANTITATIVE W GRAM STAIN
Culture: 2000 — AB
Gram Stain: NONE SEEN

## 2018-03-29 LAB — CULTURE, BLOOD (ROUTINE X 2): Culture: NO GROWTH

## 2018-03-30 ENCOUNTER — Ambulatory Visit (INDEPENDENT_AMBULATORY_CARE_PROVIDER_SITE_OTHER): Payer: BLUE CROSS/BLUE SHIELD | Admitting: Family Medicine

## 2018-03-30 ENCOUNTER — Telehealth: Payer: Self-pay

## 2018-03-30 ENCOUNTER — Encounter: Payer: Self-pay | Admitting: Family Medicine

## 2018-03-30 VITALS — BP 142/98 | HR 95 | Resp 16 | Ht 67.0 in | Wt 223.8 lb

## 2018-03-30 DIAGNOSIS — I429 Cardiomyopathy, unspecified: Secondary | ICD-10-CM | POA: Diagnosis not present

## 2018-03-30 DIAGNOSIS — J189 Pneumonia, unspecified organism: Secondary | ICD-10-CM | POA: Diagnosis not present

## 2018-03-30 DIAGNOSIS — R0609 Other forms of dyspnea: Secondary | ICD-10-CM

## 2018-03-30 DIAGNOSIS — R0600 Dyspnea, unspecified: Secondary | ICD-10-CM

## 2018-03-30 DIAGNOSIS — I5020 Unspecified systolic (congestive) heart failure: Secondary | ICD-10-CM

## 2018-03-30 LAB — CULTURE, RESPIRATORY: GRAM STAIN: NONE SEEN

## 2018-03-30 LAB — PNEUMOCYSTIS PCR: Result Pneumocystis PCR: NEGATIVE

## 2018-03-30 LAB — CULTURE, RESPIRATORY W GRAM STAIN

## 2018-03-30 NOTE — Patient Instructions (Addendum)
Thank you for coming to the office today.  Lungs significantly improved, oxygen is good. Overall you are on the right track. May still take 1-2 weeks or more to get back to full strength.  Follow-up with both HEART and LUNG specialists as advised from hospital. Heart function was reduced slightly, this is concerning for possible mild new diagnosis of heart failure, this may improve in future once you are feeling better. They will do another ECHO in the future.  [facility]) [facility] at [place], [place] [address] [address]: [address], [place], [place] Phone: [phone]  If worsening symptoms again, fever chills short of breath, productive cough, notify office or seek care again at hospital   Please schedule a Follow-up Appointment to: Return in about 2 weeks (around 04/13/2018), or if symptoms worsen or fail to improve, for pneumonia.  If you have any other questions or concerns, please feel free to call the office or send a message through [place]. You may also schedule an earlier appointment if necessary.  Additionally, you may be receiving a survey about your experience at our office within a few days to 1 week by e-mail or mail. We value your feedback.  [name], DO [place]

## 2018-03-30 NOTE — Progress Notes (Signed)
Subjective:    Patient ID: Shane Picket., male    DOB: [birth year], 52 y.o.   MRN: [id number]  Shane Hopfensperger. is a 52 y.o. male presenting on 03/30/2018 for Hospitalization Follow-up (pneumonia discharged on 03/28/18); Pneumonia; and Shortness of Breath   HPI   HOSPITAL FOLLOW-UP VISIT  Hospital/Location: [facility] Date of Admission: 03/23/18 Date of Discharge: 03/28/18 Transitions of care telephone call: Not completed  Reason for Admission: Dyspnea, Hypoxia, Pneumonia Primary (+Secondary) Diagnosis: Multifocal Pneumonia (bilateral), Dyspnea Elevated Troponin  FOLLOW-UP - Hospital H&P and Discharge Summary have been reviewed - Patient presents today about 2 days after recent hospitalization. Brief summary of recent course, patient had symptoms of pneumonia and dyspnea cough diagnosed back in 02/2018 on X-ray outpatient, he was treated with multiple rounds of antibiotics Augmentin, Azithromycin, and Levaquin for this over course of past 1 month, ultimately he did not improve and had significant worsening dyspnea, breathing, cough, fever he was sent to hospital from our office on 03/23/18 hospitalized, after CT showed multifocal pneumonia and he had hypoxia desat, also found to have mild elevated troponin and ECHO later showed reduced EF.  - Today reports overall has done well after discharge. Symptoms of dyspnea and cough have dramatically improved. He still is not at full strength, feels he is gradually improving. He is concerned about returning to work on Monday 12/16.   Additional - new diagnosis, Systolic CHF cardiomyopathy EF 40-45% on ECHO, he was referred to Cardiology after hospitalization, has not scheduled this yet. He denies any edema or swelling in lower extremity.  - New medications on discharge: None - Changes to current meds on discharge: None  Denies any fevers, chills, worsening dyspnea, chest pain or tightness, edema, headache, near syncope, dizziness  Health  Maintenance: UTD Pneumonia Vaccine Pneumovax-23 booster age < 27 already 03/24/18  Depression screen PHQ 2/9 03/23/2018 03/09/2018 12/06/2017  Decreased Interest 0 0 0  Down, Depressed, Hopeless 0 0 0  PHQ - 2 Score 0 0 0    Social History   Tobacco Use  . Smoking status: Never Smoker  . Smokeless tobacco: Current User    Types: Chew  Substance Use Topics  . Alcohol use: Yes    Comment: occasionally. rarely  . Drug use: No    Review of Systems Per HPI unless specifically indicated above     Objective:    BP (!) 142/98 (BP Location: Left Arm, Patient Position: Sitting, Cuff Size: Normal)   Pulse 95   Resp 16   Ht _0  (1.702 m)   Wt 223 lb 12.8 oz (101.5 kg)   SpO2 95%   BMI 35.05 kg/m   Wt Readings from Last 3 Encounters:  03/30/18 223 lb 12.8 oz (101.5 kg)  03/23/18 223 lb 15.8 oz (101.6 kg)  03/23/18 224 lb (101.6 kg)    Physical Exam Vitals signs and nursing note reviewed.  Constitutional:      General: He is not in acute distress.    Appearance: He is well-developed. He is not diaphoretic.     Comments: Well-appearing, comfortable, cooperative  HENT:     Head: Normocephalic and atraumatic.  Eyes:     General:        Right eye: No discharge.        Left eye: No discharge.     Conjunctiva/sclera: Conjunctivae normal.  Neck:     Musculoskeletal: Normal range of motion and neck supple.     Thyroid: No thyromegaly.  Cardiovascular:     Rate and Rhythm: Normal rate and regular rhythm.     Heart sounds: Normal heart sounds. No murmur.  Pulmonary:     Effort: Pulmonary effort is normal. No respiratory distress.     Breath sounds: Normal breath sounds. No wheezing or rales.     Comments: Mild reduced air movement bilateral lung bases, but otherwise clear lung sounds.  Speaks full sentences.  Rare cough. Musculoskeletal: Normal range of motion.  Lymphadenopathy:     Cervical: No cervical adenopathy.  Skin:    General: Skin is warm and dry.     Findings:  No erythema or rash.  Neurological:     Mental Status: He is alert and oriented to person, place, and time.  Psychiatric:        Behavior: Behavior normal.     Comments: Well groomed, good eye contact, normal speech and thoughts      I have personally reviewed the radiology report from 03/23/18 CT Chest Angio.  CLINICAL DATA:  Increased shortness of breath for the past 3 weeks. Diagnosed with pneumonia 3 weeks ago.  EXAM: CT ANGIOGRAPHY CHEST WITH CONTRAST  TECHNIQUE: Multidetector CT imaging of the chest was performed using the standard protocol during bolus administration of intravenous contrast. Multiplanar CT image reconstructions and MIPs were obtained to evaluate the vascular anatomy.  CONTRAST:  2m OMNIPAQUE IOHEXOL 350 MG/ML SOLN  COMPARISON:  Chest radiographs obtained earlier today and on 03/02/2018.  FINDINGS: Cardiovascular: Mildly enlarged heart. Normally opacified pulmonary arteries with no pulmonary arterial filling defects seen.  Mediastinum/Nodes: Small hiatal hernia. Mildly enlarged proximal right hilar and subcarinal lymph nodes. The largest has a short axis diameter of 12 mm on image number 35 series 4.  Lungs/Pleura: Small to moderate-sized right pleural effusion. Marked patchy opacity throughout the majority of the right lung. Similar opacities in the left lung. These are involving all the lobes of both lungs.  Upper Abdomen: Unremarkable.  Musculoskeletal: Thoracic and cervicothoracic spine degenerative changes.  Review of the MIP images confirms the above findings.  IMPRESSION: 1. No pulmonary emboli. 2. Extensive bilateral multilobar pneumonia, greater on the right. 3. Small to moderate-sized right pleural effusion. 4. Mild mediastinal and right hilar adenopathy, most likely reactive. 5. Small hiatal hernia.   Electronically Signed   By: SClaudie ReveringM.D.   On: 03/23/2018  13:48  -----------------------------------------------------------------------------  I have personally reviewed the radiology report from 03/26/18 ECHOCARDIOGRAM.  Result status: Final result                   *[facility]                       [address] [place] [id number]                           [id number] ------------------------------------------------------------------- Transthoracic Echocardiography  Patient:    Shane Frazier, ParkeMR #:       [id number] Date: 03/26/2018 Gender:     M Age:        89Height:     170.2 cm Weight:     101.6 kg BSA:        2.23 m^2 Pt. Status: Room:   ATTENDING    SMerton Border  B  ORDERING     Darden Dates  SONOGRAPHER  Sonia Side Hege RDCS  PERFORMING   [facility]  cc:  ------------------------------------------------------------------- LV EF: 40% -   45%  ------------------------------------------------------------------- Indications:      Cardiomegaly 429.3.  ------------------------------------------------------------------- History:   PMH:  Hypothyroidism, seizures.  Risk factors:  Diabetes mellitus.  ------------------------------------------------------------------- Study Conclusions  - Left ventricle: The cavity size was mildly dilated. Wall   thickness was normal. Systolic function was mildly to moderately   reduced. The estimated ejection fraction was in the range of 40%   to 45%. Diffuse hypokinesis. The study is not technically   sufficient to allow evaluation of LV diastolic function. - Mitral valve: There was mild regurgitation. - Left atrium: The atrium was moderately dilated. - Pulmonary arteries: Systolic pressure could not be accurately   estimated. - Pericardium, extracardiac: A trivial pericardial effusion was   identified.  ------------------------------------------------------------------- Study  data:   Study status:  Routine.  Procedure:  The patient reported no pain pre or post test. Transthoracic echocardiography for left ventricular function evaluation, for right ventricular function evaluation, and for assessment of valvular function. Image quality was adequate.  Study completion:  There were no complications.          Transthoracic echocardiography.  M-mode, complete 2D, spectral Doppler, and color Doppler.  Birthdate: Patient birthdate: [birth year].  Age:  Patient is 52 yr old.  Sex: Gender: male.    BMI: 35.1 kg/m^2.  Blood pressure:     132/88 Patient status:  Inpatient.  Study date:  Study date: 03/26/2018. Study time: 01:22 PM.  Location:  Bedside.  -------------------------------------------------------------------  ------------------------------------------------------------------- Left ventricle:  The cavity size was mildly dilated. Wall thickness was normal. Systolic function was mildly to moderately reduced. The estimated ejection fraction was in the range of 40% to 45%. Diffuse hypokinesis. The study is not technically sufficient to allow evaluation of LV diastolic function.  ------------------------------------------------------------------- Aortic valve:   Trileaflet; normal thickness leaflets. Mobility was not restricted.  Doppler:  Transvalvular velocity was within the normal range. There was no stenosis. There was no regurgitation. VTI ratio of LVOT to aortic valve: 0.66. Valve area (VTI): 2.08 cm^2. Indexed valve area (VTI): 0.93 cm^2/m^2. Peak velocity ratio of LVOT to aortic valve: 0.51. Valve area (Vmax): 1.61 cm^2. Indexed valve area (Vmax): 0.72 cm^2/m^2. Mean velocity ratio of LVOT to aortic valve: 0.61. Valve area (Vmean): 1.92 cm^2. Indexed valve area (Vmean): 0.86 cm^2/m^2.    Mean gradient (S): 5 mm Hg. Peak gradient (S): 9 mm Hg.  ------------------------------------------------------------------- Aorta:  Aortic root: The aortic root was  normal in size.  ------------------------------------------------------------------- Mitral valve:   Structurally normal valve.   Mobility was not restricted.  Doppler:  Transvalvular velocity was within the normal range. There was no evidence for stenosis. There was mild regurgitation.    Peak gradient (D): 7 mm Hg.  ------------------------------------------------------------------- Left atrium:  The atrium was moderately dilated.  ------------------------------------------------------------------- Right ventricle:  The cavity size was normal. Wall thickness was normal. Systolic function was normal.  ------------------------------------------------------------------- Pulmonic valve:    Doppler:  Transvalvular velocity was within the normal range. There was no evidence for stenosis.  ------------------------------------------------------------------- Tricuspid valve:   Structurally normal valve.    Doppler: Transvalvular velocity was within the normal range. There was no regurgitation.  ------------------------------------------------------------------- Pulmonary artery:   The main pulmonary artery was normal-sized. Systolic pressure could not be accurately estimated.  ------------------------------------------------------------------- Right atrium:  The atrium was normal in size.  ------------------------------------------------------------------- Pericardium:  A trivial pericardial effusion was identified.  ------------------------------------------------------------------- Systemic veins: Inferior vena cava: Not well visualized. The vessel was mildly dilated.  ------------------------------------------------------------------- Measurements   Left ventricle                           Value          Reference  LV ID, ED, PLAX chordal          (H)     53.4  mm       43 - 52  LV ID, ES, PLAX chordal          (H)     44.7  mm       23 - 38  LV fx shortening, PLAX  chordal   (L)     16    %        >=29  LV PW thickness, ED                      11.9  mm       ----------  IVS/LV PW ratio, ED                      0.84           <=1.3  Stroke volume, 2D                        47    ml       ----------  Stroke volume/bsa, 2D                    21    ml/m^2   ----------  LV ejection fraction, 1-p A4C            45    %        ----------  LV end-diastolic volume, 2-p             147   ml       ----------  LV end-systolic volume, 2-p              84    ml       ----------  LV ejection fraction, 2-p                43    %        ----------  Stroke volume, 2-p                       63    ml       ----------  LV end-diastolic volume/bsa, 2-p         66    ml/m^2   ----------  LV end-systolic volume/bsa, 2-p          38    ml/m^2   ----------  Stroke volume/bsa, 2-p                   28.1  ml/m^2   ----------  LV e&', lateral                           9.57  cm/s     ----------  LV E/e&', lateral  13.79          ----------  LV e&', medial                            6.2   cm/s     ----------  LV E/e&', medial                          21.29          ----------  LV e&', average                           7.89  cm/s     ----------  LV E/e&', average                         16.74          ----------    Ventricular septum                       Value          Reference  IVS thickness, ED                        10    mm       ----------    LVOT                                     Value          Reference  LVOT ID, S                               20    mm       ----------  LVOT area                                3.14  cm^2     ----------  LVOT ID                                  20    mm       ----------  LVOT peak velocity, S                    77.6  cm/s     ----------  LVOT mean velocity, S                    60.4  cm/s     ----------  LVOT VTI, S                              15.1  cm       ----------  Stroke volume (SV), LVOT DP               47.4  ml       ----------  Stroke index (SV/bsa), LVOT DP           21.3  ml/m^2   ----------    Aortic valve  Value          Reference  Aortic valve peak velocity, S            151   cm/s     ----------  Aortic valve mean velocity, S            98.9  cm/s     ----------  Aortic valve VTI, S                      22.8  cm       ----------  Aortic mean gradient, S                  5     mm Hg    ----------  Aortic peak gradient, S                  9     mm Hg    ----------  VTI ratio, LVOT/AV                       0.66           ----------  Aortic valve area, VTI                   2.08  cm^2     ----------  Aortic valve area/bsa, VTI               0.93  cm^2/m^2 ----------  Velocity ratio, peak, LVOT/AV            0.51           ----------  Aortic valve area, peak velocity         1.61  cm^2     ----------  Aortic valve area/bsa, peak              0.72  cm^2/m^2 ----------  velocity  Velocity ratio, mean, LVOT/AV            0.61           ----------  Aortic valve area, mean velocity         1.92  cm^2     ----------  Aortic valve area/bsa, mean              0.86  cm^2/m^2 ----------  velocity    Aorta                                    Value          Reference  Aortic root ID, ED                       24    mm       ----------    Left atrium                              Value          Reference  LA ID, A-P, ES                           49    mm       ----------  LA ID/bsa, A-P  2.2   cm/m^2   <=2.2  LA volume, S                             77.9  ml       ----------  LA volume/bsa, S                         34.9  ml/m^2   ----------  LA volume, ES, 1-p A4C                   75.6  ml       ----------  LA volume/bsa, ES, 1-p A4C               33.9  ml/m^2   ----------  LA volume, ES, 1-p A2C                   78.7  ml       ----------  LA volume/bsa, ES, 1-p A2C               35.3  ml/m^2   ----------    Mitral valve                              Value          Reference  Mitral E-wave peak velocity              132   cm/s     ----------  Mitral deceleration time         (L)     142   ms       150 - 230  Mitral peak gradient, D                  7     mm Hg    ----------    Right atrium                             Value          Reference  RA ID, S-I, ES, A4C              (H)     52.8  mm       34 - 49  RA area, ES, A4C                         19    cm^2     8.3 - 19.5  RA volume, ES, A/L                       56.1  ml       ----------  RA volume/bsa, ES, A/L                   25.1  ml/m^2   ----------    Right ventricle                          Value          Reference  RV ID, ED, PLAX                          36.8  mm  19 - 38  RV ID, minor axis, ED, A4C base          33.8  mm       ----------  TAPSE                                    36.6  mm       ----------  RV s&', lateral, S                        18.4  cm/s     ----------    Pulmonic valve                           Value          Reference  Pulmonic valve peak velocity, S          82.5  cm/s     ----------  Legend: (L)  and  (H)  mark values outside specified reference range.  ------------------------------------------------------------------- Prepared and Electronically Authenticated by  Kathlyn Sacramento, MD 2019-12-09T15:19:07      Results for orders placed or performed during the hospital encounter of 03/23/18  MRSA PCR Screening  Result Value Ref Range   MRSA by PCR NEGATIVE NEGATIVE  Culture, blood (routine x 2) Call MD if unable to obtain prior to antibiotics being given  Result Value Ref Range   Specimen Description BLOOD LEFT ANTECUBITAL    Special Requests      BOTTLES DRAWN AEROBIC AND ANAEROBIC Blood Culture adequate volume   Culture      NO GROWTH 5 DAYS Performed at [facility], [address] [place], [place] [address]    Report Status 03/28/2018 FINAL   Culture, sputum-assessment  Result Value Ref Range   Specimen  Description EXPECTORATED SPUTUM    Special Requests NONE    Sputum evaluation      THIS SPECIMEN IS ACCEPTABLE FOR SPUTUM CULTURE Performed at [facility], [address] [place], [place] [address]    Report Status 03/23/2018 FINAL   Respiratory Panel by PCR  Result Value Ref Range   Adenovirus NOT DETECTED NOT DETECTED   Coronavirus 229E NOT DETECTED NOT DETECTED   Coronavirus HKU1 NOT DETECTED NOT DETECTED   Coronavirus NL63 NOT DETECTED NOT DETECTED   Coronavirus OC43 NOT DETECTED NOT DETECTED   Metapneumovirus NOT DETECTED NOT DETECTED   Rhinovirus / Enterovirus NOT DETECTED NOT DETECTED   Influenza A NOT DETECTED NOT DETECTED   Influenza B NOT DETECTED NOT DETECTED   Parainfluenza Virus 1 NOT DETECTED NOT DETECTED   Parainfluenza Virus 2 NOT DETECTED NOT DETECTED   Parainfluenza Virus 3 NOT DETECTED NOT DETECTED   Parainfluenza Virus 4 NOT DETECTED NOT DETECTED   Respiratory Syncytial Virus NOT DETECTED NOT DETECTED   Bordetella pertussis NOT DETECTED NOT DETECTED   Chlamydophila pneumoniae NOT DETECTED NOT DETECTED   Mycoplasma pneumoniae NOT DETECTED NOT DETECTED  Culture, respiratory  Result Value Ref Range   Specimen Description      EXPECTORATED SPUTUM Performed at [facility], [address] [place], [place] [address]    Special Requests      NONE Reflexed from [phone] Performed at [facility], [address] [place], [place] [address]    Gram Stain      FEW WBC PRESENT, PREDOMINANTLY PMN RARE SQUAMOUS EPITHELIAL  CELLS PRESENT FEW GRAM POSITIVE COCCI RARE GRAM POSITIVE RODS    Culture      FEW Consistent with normal respiratory flora. Performed at [facility], [place] [address], [place], [place] [address]    Report Status 03/26/2018 FINAL   Culture, blood (Routine X 2) w Reflex to ID Panel  Result Value Ref Range   Specimen Description BLOOD LEFT ANTECUBITAL    Special Requests      BOTTLES DRAWN AEROBIC AND  ANAEROBIC Blood Culture results may not be optimal due to an excessive volume of blood received in culture bottles   Culture      NO GROWTH 5 DAYS Performed at [facility], [address] [place], [place] [address]    Report Status 03/29/2018 FINAL   KOH prep  Result Value Ref Range   Specimen Description BRONCHIAL ALVEOLAR LAVAGE    Special Requests NONE    KOH Prep      NO YEAST OR FUNGAL ELEMENTS SEEN Performed at [facility], [address] [place], [place] [address]    Report Status 03/27/2018 FINAL   Acid Fast Smear (AFB)  Result Value Ref Range   AFB Specimen Processing Concentration    Acid Fast Smear Negative    Source (AFB) BRONCHIAL ALVEOLAR LAVAGE   Acid Fast Smear (AFB)  Result Value Ref Range   AFB Specimen Processing Concentration    Acid Fast Smear Negative    Source (AFB) BRONCH BRUSH TIP   Culture, fungus without smear  Result Value Ref Range   Specimen Description      BRONCH BRUSH TIP Performed at [facility], [address] [place], [place] [address]    Special Requests      NONE Performed at [facility], [address], [place], [place] [address]    Culture      NO GROWTH < 24 HOURS Performed at [facility], [address], [place], [place] [address]    Report Status PENDING   Culture, respiratory  Result Value Ref Range   Specimen Description BRONCHIAL BRUSHING    Special Requests NONE    Gram Stain NO WBC SEEN NO ORGANISMS SEEN     Culture      FEW BURKHOLDERIA SPECIES MULTI-DRUG RESISTANT ORGANISM REPEATING SUSCEPTIBILITIES FOR CONFIRMATION Performed at [facility], [place] [address] [place], [place] [address]    Report Status PENDING    Organism ID, Bacteria BURKHOLDERIA SPECIES       Susceptibility   Burkholderia species - MIC*    CEFEPIME RESISTANT Resistant     CEFAZOLIN >=64 RESISTANT Resistant     GENTAMICIN >=16 RESISTANT Resistant     CIPROFLOXACIN RESISTANT  Resistant     IMIPENEM >=16 RESISTANT Resistant     TRIMETH/SULFA 80 RESISTANT Resistant     * FEW BURKHOLDERIA SPECIES  Culture, bal-quantitative  Result Value Ref Range   Specimen Description      BRONCHIAL ALVEOLAR LAVAGE Performed at [facility], [address], [place], [place] [address]    Special Requests      NONE Performed at [facility], [address] [place], [place] [address]    Gram Stain NO WBC SEEN NO ORGANISMS SEEN     Culture (A)     2,000 COLONIES/mL Consistent with normal respiratory flora. Performed at [facility], [place] [address], [place], [place] [address]    Report Status 03/29/2018 FINAL   Basic metabolic panel  Result Value Ref Range   Sodium 138 135 - 145 mmol/L   Potassium 3.3 (L) 3.5 - 5.1 mmol/L   Chloride 99 98 - 111 mmol/L   CO2 31 22 - 32 mmol/L   Glucose, Bld 162 (H) 70 - 99 mg/dL   BUN 12 6 - 20 mg/dL   Creatinine, Ser 1.08 0.61 - 1.24 mg/dL   Calcium 8.6 (L) 8.9 - 10.3 mg/dL   GFR calc non Af Amer >60 >60 mL/min   GFR calc Af Amer >60 >60 mL/min   Anion gap 8 5 - 15  CBC  Result Value Ref Range   WBC 10.1 4.0 - 10.5 K/uL   RBC 5.59 4.22 - 5.81 MIL/uL   Hemoglobin 16.3 13.0 - 17.0 g/dL   HCT 50.2 39.0 - 52.0 %   MCV 89.8 80.0 - 100.0 fL   MCH 29.2 26.0 - 34.0 pg   MCHC 32.5 30.0 - 36.0 g/dL   RDW 13.5 11.5 - 15.5 %   Platelets 410 (H) 150 - 400 K/uL   nRBC 0.0 0.0 - 0.2 %  Troponin I - Add-On to previous collection  Result Value Ref Range   Troponin I 0.13 (HH) <0.03 ng/mL  Troponin I - Now Then Q6H  Result Value Ref Range   Troponin I 0.12 (HH) <0.03 ng/mL  Troponin I - Now Then Q6H  Result Value Ref Range   Troponin I 0.12 (HH) <0.03 ng/mL  Troponin I - Now Then Q6H  Result Value Ref Range   Troponin I 0.12 (HH) <0.03 ng/mL  HIV antibody (Routine Screening)  Result Value Ref Range   HIV Screen 4th Generation wRfx Non Reactive Non Reactive  Strep pneumoniae urinary antigen  Result Value Ref  Range   Strep Pneumo Urinary Antigen NEGATIVE NEGATIVE  CBC  Result Value Ref Range   WBC 10.0 4.0 - 10.5 K/uL   RBC 5.39 4.22 - 5.81 MIL/uL   Hemoglobin 15.6 13.0 - 17.0 g/dL   HCT 47.7 39.0 - 52.0 %   MCV 88.5 80.0 - 100.0 fL   MCH 28.9 26.0 - 34.0 pg   MCHC 32.7 30.0 - 36.0 g/dL   RDW 13.8 11.5 - 15.5 %   Platelets 398 150 - 400 K/uL   nRBC 0.0 0.0 - 0.2 %  Creatinine, serum  Result Value Ref Range   Creatinine, Ser 1.00 0.61 - 1.24 mg/dL   GFR calc non Af Amer >60 >60 mL/min   GFR calc Af Amer >60 >60 mL/min  Influenza panel by PCR (type A & B)  Result Value Ref Range   Influenza A By PCR NEGATIVE NEGATIVE   Influenza B By PCR NEGATIVE NEGATIVE  Adenovirus antibodies  Result Value Ref Range   Adenovirus Antibody 1:16 (H) Neg:<1:8  Legionella Pneumophila Serogp 1 Ur Ag  Result Value Ref Range   L. pneumophila Serogp 1 Ur Ag Negative Negative   Source of Sample URINE, RANDOM   Glucose, capillary  Result Value Ref Range   Glucose-Capillary 264 (H) 70 - 99 mg/dL  Glucose, capillary  Result Value Ref Range   Glucose-Capillary 151 (H) 70 - 99 mg/dL  Glucose, capillary  Result Value Ref Range   Glucose-Capillary 125 (H) 70 - 99 mg/dL  Glucose, capillary  Result Value Ref Range   Glucose-Capillary 95 70 - 99 mg/dL  Glucose, capillary  Result Value Ref Range   Glucose-Capillary 167 (H) 70 - 99 mg/dL  Basic metabolic panel  Result Value Ref Range  Sodium 137 135 - 145 mmol/L   Potassium 3.4 (L) 3.5 - 5.1 mmol/L   Chloride 99 98 - 111 mmol/L   CO2 30 22 - 32 mmol/L   Glucose, Bld 179 (H) 70 - 99 mg/dL   BUN 15 6 - 20 mg/dL   Creatinine, Ser 0.96 0.61 - 1.24 mg/dL   Calcium 8.5 (L) 8.9 - 10.3 mg/dL   GFR calc non Af Amer >60 >60 mL/min   GFR calc Af Amer >60 >60 mL/min   Anion gap 8 5 - 15  CBC  Result Value Ref Range   WBC 15.3 (H) 4.0 - 10.5 K/uL   RBC 5.28 4.22 - 5.81 MIL/uL   Hemoglobin 15.3 13.0 - 17.0 g/dL   HCT 46.5 39.0 - 52.0 %   MCV 88.1 80.0 - 100.0  fL   MCH 29.0 26.0 - 34.0 pg   MCHC 32.9 30.0 - 36.0 g/dL   RDW 13.3 11.5 - 15.5 %   Platelets 444 (H) 150 - 400 K/uL   nRBC 0.0 0.0 - 0.2 %  Glucose, capillary  Result Value Ref Range   Glucose-Capillary 408 (H) 70 - 99 mg/dL  Glucose, capillary  Result Value Ref Range   Glucose-Capillary 212 (H) 70 - 99 mg/dL  Glucose, capillary  Result Value Ref Range   Glucose-Capillary 280 (H) 70 - 99 mg/dL  Glucose, capillary  Result Value Ref Range   Glucose-Capillary 191 (H) 70 - 99 mg/dL  Basic metabolic panel  Result Value Ref Range   Sodium 136 135 - 145 mmol/L   Potassium 3.6 3.5 - 5.1 mmol/L   Chloride 98 98 - 111 mmol/L   CO2 28 22 - 32 mmol/L   Glucose, Bld 291 (H) 70 - 99 mg/dL   BUN 22 (H) 6 - 20 mg/dL   Creatinine, Ser 1.21 0.61 - 1.24 mg/dL   Calcium 9.1 8.9 - 10.3 mg/dL   GFR calc non Af Amer >60 >60 mL/min   GFR calc Af Amer >60 >60 mL/min   Anion gap 10 5 - 15  CBC  Result Value Ref Range   WBC 23.1 (H) 4.0 - 10.5 K/uL   RBC 5.61 4.22 - 5.81 MIL/uL   Hemoglobin 16.2 13.0 - 17.0 g/dL   HCT 48.8 39.0 - 52.0 %   MCV 87.0 80.0 - 100.0 fL   MCH 28.9 26.0 - 34.0 pg   MCHC 33.2 30.0 - 36.0 g/dL   RDW 13.4 11.5 - 15.5 %   Platelets 536 (H) 150 - 400 K/uL   nRBC 0.0 0.0 - 0.2 %  Glucose, capillary  Result Value Ref Range   Glucose-Capillary 309 (H) 70 - 99 mg/dL  Glucose, capillary  Result Value Ref Range   Glucose-Capillary 242 (H) 70 - 99 mg/dL  Brain natriuretic peptide  Result Value Ref Range   B Natriuretic Peptide 209.0 (H) 0.0 - 100.0 pg/mL  Protime-INR  Result Value Ref Range   Prothrombin Time 14.7 11.4 - 15.2 seconds   INR 1.16   APTT  Result Value Ref Range   aPTT 29 24 - 36 seconds  Glucose, capillary  Result Value Ref Range   Glucose-Capillary 132 (H) 70 - 99 mg/dL  CBC  Result Value Ref Range   WBC 19.4 (H) 4.0 - 10.5 K/uL   RBC 4.67 4.22 - 5.81 MIL/uL   Hemoglobin 13.5 13.0 - 17.0 g/dL   HCT 41.3 39.0 - 52.0 %   MCV 88.4 80.0 - 100.0 fL  MCH 28.9 26.0 - 34.0 pg   MCHC 32.7 30.0 - 36.0 g/dL   RDW 13.5 11.5 - 15.5 %   Platelets 468 (H) 150 - 400 K/uL   nRBC 0.0 0.0 - 0.2 %  Basic metabolic panel  Result Value Ref Range   Sodium 135 135 - 145 mmol/L   Potassium 3.9 3.5 - 5.1 mmol/L   Chloride 100 98 - 111 mmol/L   CO2 30 22 - 32 mmol/L   Glucose, Bld 324 (H) 70 - 99 mg/dL   BUN 25 (H) 6 - 20 mg/dL   Creatinine, Ser 1.34 (H) 0.61 - 1.24 mg/dL   Calcium 8.5 (L) 8.9 - 10.3 mg/dL   GFR calc non Af Amer >60 >60 mL/min   GFR calc Af Amer >60 >60 mL/min   Anion gap 5 5 - 15  Glucose, capillary  Result Value Ref Range   Glucose-Capillary 154 (H) 70 - 99 mg/dL  Glucose, capillary  Result Value Ref Range   Glucose-Capillary 177 (H) 70 - 99 mg/dL  Glucose, capillary  Result Value Ref Range   Glucose-Capillary 166 (H) 70 - 99 mg/dL  Glucose, capillary  Result Value Ref Range   Glucose-Capillary 78 70 - 99 mg/dL  Glucose, capillary  Result Value Ref Range   Glucose-Capillary 91 70 - 99 mg/dL  Glucose, capillary  Result Value Ref Range   Glucose-Capillary 70 70 - 99 mg/dL  Glucose, capillary  Result Value Ref Range   Glucose-Capillary 98 70 - 99 mg/dL  Glucose, capillary  Result Value Ref Range   Glucose-Capillary 162 (H) 70 - 99 mg/dL  Glucose, capillary  Result Value Ref Range   Glucose-Capillary 183 (H) 70 - 99 mg/dL  Glucose, capillary  Result Value Ref Range   Glucose-Capillary 194 (H) 70 - 99 mg/dL  Glucose, capillary  Result Value Ref Range   Glucose-Capillary 79 70 - 99 mg/dL  ECHOCARDIOGRAM COMPLETE  Result Value Ref Range   Weight 3,583.8 oz   Height 67 in   BP 132/88 mmHg  Cytology - Non PAP;  Result Value Ref Range   CYTOLOGY - NON GYN      Cytology - Non PAP CASE: [id number] PATIENT: Shane Frazier Non-Gyn Cytology Report     SPECIMEN SUBMITTED: A. Lung, RLL; brushing  CLINICAL HISTORY: Shortness of breath, pulmonary infiltrates  PRE-OPERATIVE DIAGNOSIS: Probable  pneumonia  POST-OPERATIVE DIAGNOSIS: Severe acute and chronic bronchitis     DIAGNOSIS: A. LUNG, RIGHT LOWER LOBE; BRONCHOSCOPY BRUSHING: - NEGATIVE FOR MALIGNANCY. - BRONCHIAL CELLS AND MACROPHAGES.  Comment: Slides reviewed: 2 Diff-Quik slides and 1 Thin-Prep slide   GROSS DESCRIPTION: A. Site: RLL brushing Procedure: Bronchoscopy Cytotechnologist: Micheline Rough and Rivka Barbara Specimen(s) collected: 2 Diff-Quik stained slides 0 Pap stained slides Specimen labeled: RLL brush                      Volume: Not applicable                      Description: Clear CytoLyt solution with a metallic brush                      Submitted for: ThinPrep   Final Diagnosis performed by Bryan Lemma, MD.   Electronically signed 12/1 04/2017 4:58:05PM The electronic signature indicates that the named Attending Pathologist has evaluated the specimen  Technical component performed at [facility], [address], [place], [place] [address] Lab: [phone] Dir: Rush Farmer,  MD, MMM  Professional component performed at [facility], [facility], [place], [place], [place] [address] Lab: [phone] Dir: Dellia Nims. Reuel Derby, MD   Cytology - Non PAP;  Result Value Ref Range   CYTOLOGY - NON GYN      Cytology - Non PAP CASE: [id number] PATIENT: Shane Frazier Non-Gyn Cytology Report     SPECIMEN SUBMITTED: A. Lung, RML; lavage  CLINICAL HISTORY: Shortness of breath, pulmonary infiltrates  PRE-OPERATIVE DIAGNOSIS: Probable pneumonia  POST-OPERATIVE DIAGNOSIS: Severe acute and chronic bronchitis     DIAGNOSIS: A. LUNG, RIGHT MIDDLE LOBE; BRONCHOALVEOLAR LAVAGE: - NEGATIVE FOR MALIGNANCY. - ABUNDANT MACROPHAGES AND NEUTROPHILS, AND A FEW LYMPHOCYTES  Comment: Slides reviewed: 1 ThinPrep slide   GROSS DESCRIPTION: A. Site: RML Procedure: Bronchoscopy Cytotechnologist: Micheline Rough and Rivka Barbara Specimen(s) collected: 0 Diff-Quik stained  slides 0 Pap stained slides Specimen labeled: RML lavage                      Volume: 20 mL                      Description: Cloudy pink fluid                      Submitted for: ThinPrep   Final Diagnosis performed by Bryan Lemma, MD.   Electronically signed 03/28/2018 5:31:15PM The electronic signature  indicates that the named Attending Pathologist has evaluated the specimen  Technical component performed at [facility], [address] [place], [place] [address] Lab: [phone] Dir: Rush Farmer, MD, MMM  Professional component performed at [facility], [facility], [place], [place], [place] [address] Lab: [phone] Dir: Dellia Nims. Rubinas, MD       Assessment & Plan:   Problem List Items Addressed This Visit    Multifocal pneumonia - Primary  Clinically pneumonia appears to be resolving Has already completed all antibiotics inpatient, was discharged off oral antibiotics Reviewed pulm bronch, without evidence of any concerns Afebrile, pulse oximetry 95% on RA, no focal lung abnormality  Plan - Reassurance - advised work on deep breathing exercises for now - Note extended out of work until next week - return [place] 12/18 - He should follow-up with Pulmonology as planned, after their eval in hospital - May warrant further imaging on repeat in future from pulm - Follow-up - strict return criteria given if worsening    Systolic heart failure secondary to idiopathic cardiomyopathy ([place])  New finding on ECHO done with reduced LVEF 40-45% Clinically may have been contributing to dyspnea He is euvolemic now, without evidence of edema or volume overload  Plan He should follow-up with Cardiology as scheduled to discuss this, they may repeat ECHO in future when he is back to baseline. If not referred or no appointment, he should contact them or can call us back to refer if needed      Other Visit Diagnoses    Dyspnea on exertion          No orders of the  defined types were placed in this encounter.   Follow up plan: Return in about 2 weeks (around 04/13/2018), or if symptoms worsen or fail to improve, for pneumonia.  Keep apt scheduled 08/2018  [name], [facility] 03/30/2018, 8:50 AM

## 2018-03-30 NOTE — Telephone Encounter (Signed)
Spoke to patient, scheduled HFU apt and made patient aware to get CXR before apt.

## 2018-04-05 LAB — PNEUMOCYSTIS SMEAR, DFA

## 2018-04-13 ENCOUNTER — Encounter: Payer: Self-pay | Admitting: Family Medicine

## 2018-04-13 NOTE — Progress Notes (Signed)
Completed form from [facility] for short term disability for patient, recently with hospitalization, multifocal pneumonia, he was taken out of work 12/6 to 12/17, return on 12/18 without restrictions. I spoke with patient today, he was physically able to return to work on 12/18, however he states that he has resigned and has worked w/ Forensic psychologist. I advised him that I will submit the short term paper work then as planned, he has been treated, referred to specialist, hospitalized and then was advised he can return on 12/18 without restrictions. This will be faxed first thing on Monday 12/30.  [name], [facility] 04/13/2018, 6:04 PM

## 2018-04-17 LAB — CULTURE, FUNGUS WITHOUT SMEAR

## 2018-04-19 ENCOUNTER — Other Ambulatory Visit: Payer: Self-pay

## 2018-04-19 ENCOUNTER — Ambulatory Visit
Admission: RE | Admit: 2018-04-19 | Discharge: 2018-04-19 | Disposition: A | Discharge status: Home / Self Care | Payer: BLUE CROSS/BLUE SHIELD | Source: Ambulatory Visit | Attending: Pulmonary Disease | Admitting: Pulmonary Disease

## 2018-04-19 ENCOUNTER — Ambulatory Visit
Admission: RE | Admit: 2018-04-19 | Discharge: 2018-04-19 | Disposition: A | Discharge status: Home / Self Care | Payer: BLUE CROSS/BLUE SHIELD | Attending: Pulmonary Disease | Admitting: Pulmonary Disease

## 2018-04-19 DIAGNOSIS — J189 Pneumonia, unspecified organism: Secondary | ICD-10-CM

## 2018-04-19 MED ORDER — NA SULFATE-K SULFATE-MG SULF 17.5-3.13-1.6 GM/177ML PO SOLN: 1.0000 | Freq: Once | ORAL | 0 refills | Status: AC

## 2018-04-20 ENCOUNTER — Encounter: Payer: Self-pay | Admitting: Pulmonary Disease

## 2018-04-20 ENCOUNTER — Ambulatory Visit (INDEPENDENT_AMBULATORY_CARE_PROVIDER_SITE_OTHER): Payer: BLUE CROSS/BLUE SHIELD | Admitting: Pulmonary Disease

## 2018-04-20 VITALS — BP 108/60 | HR 98 | Resp 16 | Ht 67.0 in | Wt 228.0 lb

## 2018-04-20 DIAGNOSIS — R059 Cough, unspecified: Secondary | ICD-10-CM

## 2018-04-20 DIAGNOSIS — R05 Cough: Secondary | ICD-10-CM

## 2018-04-20 DIAGNOSIS — J189 Pneumonia, unspecified organism: Secondary | ICD-10-CM

## 2018-04-20 DIAGNOSIS — R918 Other nonspecific abnormal finding of lung field: Secondary | ICD-10-CM

## 2018-04-20 DIAGNOSIS — Z79899 Other long term (current) drug therapy: Secondary | ICD-10-CM

## 2018-04-20 NOTE — Patient Instructions (Signed)
Advance your physical activity as tolerated Continue Tessalon Perles as needed for cough Follow-up in 4-6 weeks with PFTs (lung function test) and repeat chest x-ray prior to that follow-up

## 2018-04-20 NOTE — Progress Notes (Signed)
PULMONARY POST HOSPITAL NOTE  Requesting MD/Service: Adrian Prows [facility] consult) Date of initial consultation: 03/26/2018 Reason for consultation: Extensive right lung infiltrate and patchy left lung infiltrate  PT PROFILE: 53 y.o.  M never smoker initially presented to primary care physician as outpatient with dyspnea and cough.  Chest x-ray revealed infiltrate.  Treated as community-acquired pneumonia.  Failed to improve.  Admitted 03/23/2018 with extensive infiltrate in right lung.  DATA: 03/02/18 CXR (outpatient): Vague bibasilar infiltrates 03/23/18 CXR (admission): Extensive infiltrate throughout all lobes on right.  Patchy infiltrate on left.  Borderline cardiomegaly 12/06-12/11/19: Hospitalized with diagnosis of community-acquired pneumonia 03/23/18 CT chest: Extensive airspace disease throughout on right with very small right pleural effusion.  Patchy infiltrates/airspace disease in LLL and to a lesser extent in LUL. 03/26/18: No significant change in pulmonary infiltrates compared to prior CXR.  Cardiac silhouette appears moderately enlarged. 03/27/18 Bronchoscopy: Mucosal changes of severe acute and chronic bronchitis.  No endobronchial tumors, masses, foreign bodies.  BAL performed in RML.  Brushings performed in RLL.  Cytology negative.  Washings negative for AFB, fungus, pneumocystis.  Bacterial cultures of BAL fluid grew Burkholderia.  INTERVAL: Discharged from [facility] 03/28/2018. No major pulmonary events in interim  SUBJ:  This is a scheduled post hospital follow up. Since discharge he has gradually improved but he is not all the way back to his baseline.  He continues to have episodic mild exertional dyspnea.  He continues to have paroxysms of cough.  His cough is nonproductive or productive only of scant mucus.  He has been prescribed Tessalon Perles by his primary care physician and these improve his cough.  He denies fever, chest pain, hemoptysis, purulent sputum, lower  extremity edema, calf tenderness.  He also denies orthopnea and paroxysmal nocturnal dyspnea.  We reviewed his smoking status and he has never smoked.  He does dip snuff.  He has never vaped.   Vitals:   04/20/18 0928 04/20/18 0930  BP:  108/60  Pulse:  98  Resp: 16   SpO2:  94%  Weight: 228 lb (103.4 kg)   Height: _0  (1.702 m)   RA   EXAM:  Gen: WDWN, No overt respiratory distress HEENT: NCAT, sclera white, oropharynx normal Neck: Supple without LAN, thyromegaly, JVD Lungs: breath sounds full without wheezes.  Faint right basilar crackles noted. Cardiovascular: RRR, no murmurs noted Abdomen: Soft, nontender, normal BS Ext: without clubbing, cyanosis, edema Neuro: CNs grossly intact, motor and sensory intact Skin: Limited exam, no lesions noted  DATA:   BMP Latest Ref Rng & Units 03/27/2018 03/26/2018 03/25/2018  Glucose 70 - 99 mg/dL 324(H) 291(H) 179(H)  BUN 6 - 20 mg/dL 25(H) 22(H) 15  Creatinine 0.61 - 1.24 mg/dL 1.34(H) 1.21 0.96  BUN/Creat Ratio 6 - 22 (calc) - - -  Sodium 135 - 145 mmol/L 135 136 137  Potassium 3.5 - 5.1 mmol/L 3.9 3.6 3.4(L)  Chloride 98 - 111 mmol/L 100 98 99  CO2 22 - 32 mmol/L _1 Calcium 8.9 - 10.3 mg/dL 8.5(L) 9.1 8.5(L)    CBC Latest Ref Rng & Units 03/27/2018 03/26/2018 03/25/2018  WBC 4.0 - 10.5 K/uL 19.4(H) 23.1(H) 15.3(H)  Hemoglobin 13.0 - 17.0 g/dL 13.5 16.2 15.3  Hematocrit 39.0 - 52.0 % 41.3 48.8 46.5  Platelets 150 - 400 K/uL 468(H) 536(H) 444(H)    CXR 04/19/18:  Nearly total resolution of R lung infiltrate  I have personally reviewed all chest radiographs reported above including CXRs and CT chest  unless otherwise indicated  IMPRESSION:     ICD-10-CM   1. Resolving CAP J18.9   2. Pulmonary infiltrate R91.8   3. Cough R05   4. On angiotensin-converting enzyme (ACE) inhibitors Z79.899    Overall, he is markedly improved.  I think he is in the expected resolution phase after a severe community-acquired pneumonia.   Dramatic improvement in chest x-ray is encouraging.  I am somewhat concerned about the persistent cough which might simply be a residual symptom from his recent pneumonia as his right lung heals.  However, I note that he is on an ACE inhibitor.  Prior to his recent hospitalization, he had no problems with chronic cough.  PLAN:  I have instructed him to advance his activity as tolerated He is to continue [employer] as needed for cough Follow-up in 4-6 weeks with PFTs and CXR prior to that visit   Merton Border, MD PCCM service Mobile [phone] Pager [phone] 04/20/2018 9:48 AM

## 2018-04-26 ENCOUNTER — Telehealth: Payer: Self-pay | Admitting: Gastroenterology

## 2018-04-26 LAB — FUNGUS CULTURE WITH STAIN

## 2018-04-26 LAB — FUNGUS CULTURE RESULT

## 2018-04-26 LAB — FUNGAL ORGANISM REFLEX

## 2018-04-26 NOTE — Telephone Encounter (Signed)
Pt Is calling to r/s his procedure for 04/27/18 due to his insurance not picking it up

## 2018-04-27 ENCOUNTER — Ambulatory Visit
Admission: RE | Admit: 2018-04-27 | Payer: BLUE CROSS/BLUE SHIELD | Source: Home / Self Care | Admitting: Gastroenterology

## 2018-04-27 ENCOUNTER — Encounter: Admission: RE | Payer: Self-pay | Source: Home / Self Care

## 2018-04-27 SURGERY — COLONOSCOPY WITH PROPOFOL
Anesthesia: General

## 2018-04-27 NOTE — Telephone Encounter (Signed)
Pt contacted office yesterday to cancel his colonoscopy.  His insurance will not cover him to have it at this time.  Asked him to call me back if anything changes with his insurance.  Thanks [employer]

## 2018-05-10 LAB — ACID FAST CULTURE WITH REFLEXED SENSITIVITIES (MYCOBACTERIA): Acid Fast Culture: NEGATIVE

## 2018-05-15 ENCOUNTER — Ambulatory Visit: Payer: Self-pay

## 2018-05-24 ENCOUNTER — Ambulatory Visit: Payer: Self-pay | Admitting: Pulmonary Disease

## 2018-05-24 LAB — ACID FAST CULTURE WITH REFLEXED SENSITIVITIES (MYCOBACTERIA): Acid Fast Culture: NEGATIVE

## 2018-07-10 ENCOUNTER — Other Ambulatory Visit: Payer: Self-pay

## 2018-07-10 DIAGNOSIS — E11319 Type 2 diabetes mellitus with unspecified diabetic retinopathy without macular edema: Secondary | ICD-10-CM

## 2018-07-11 ENCOUNTER — Other Ambulatory Visit: Payer: Self-pay | Admitting: Family Medicine

## 2018-07-11 DIAGNOSIS — E039 Hypothyroidism, unspecified: Secondary | ICD-10-CM

## 2018-07-11 DIAGNOSIS — E113293 Type 2 diabetes mellitus with mild nonproliferative diabetic retinopathy without macular edema, bilateral: Secondary | ICD-10-CM

## 2018-07-11 LAB — HEMOGLOBIN A1C
Hgb A1c MFr Bld: 8.5 % of total Hgb — ABNORMAL HIGH (ref <5.7)
Mean Plasma Glucose: 197 (calc)
eAG (mmol/L): 10.9 (calc)

## 2018-08-06 DIAGNOSIS — R519 Headache, unspecified: Secondary | ICD-10-CM | POA: Insufficient documentation

## 2018-08-22 ENCOUNTER — Other Ambulatory Visit: Payer: Self-pay | Admitting: Family Medicine

## 2018-08-22 DIAGNOSIS — E039 Hypothyroidism, unspecified: Secondary | ICD-10-CM

## 2018-08-22 DIAGNOSIS — I10 Essential (primary) hypertension: Secondary | ICD-10-CM

## 2018-08-22 MED ORDER — DILTIAZEM HCL ER COATED BEADS 420 MG PO TB24: 420.0000 mg | ORAL_TABLET | ORAL | 1 refills | Status: DC

## 2018-08-22 MED ORDER — LEVOTHYROXINE SODIUM 200 MCG PO TABS: 200.0000 ug | ORAL_TABLET | Freq: Every day | ORAL | 1 refills | Status: DC

## 2018-08-22 NOTE — Telephone Encounter (Signed)
Pt called requesting refill on matizim 420 mg, Levothyroxine  CHANGE DRUG STORE TO [facility]

## 2018-08-31 ENCOUNTER — Other Ambulatory Visit: Payer: BLUE CROSS/BLUE SHIELD

## 2018-09-03 ENCOUNTER — Encounter: Payer: Self-pay | Admitting: Anesthesiology

## 2018-09-07 ENCOUNTER — Ambulatory Visit: Payer: BLUE CROSS/BLUE SHIELD | Admitting: Family Medicine

## 2018-10-01 ENCOUNTER — Other Ambulatory Visit: Payer: No Typology Code available for payment source

## 2018-10-01 DIAGNOSIS — E113293 Type 2 diabetes mellitus with mild nonproliferative diabetic retinopathy without macular edema, bilateral: Secondary | ICD-10-CM

## 2018-10-01 DIAGNOSIS — E039 Hypothyroidism, unspecified: Secondary | ICD-10-CM

## 2018-10-02 LAB — HEMOGLOBIN A1C
Hgb A1c MFr Bld: 8.1 % of total Hgb — ABNORMAL HIGH (ref <5.7)
Mean Plasma Glucose: 186 (calc)
eAG (mmol/L): 10.3 (calc)

## 2018-10-02 LAB — TSH: TSH: 1.17 mIU/L (ref 0.40–4.50)

## 2018-10-02 LAB — T4, FREE: Free T4: 1.7 ng/dL (ref 0.8–1.8)

## 2018-10-15 ENCOUNTER — Ambulatory Visit: Payer: BLUE CROSS/BLUE SHIELD | Admitting: Family Medicine

## 2018-10-15 ENCOUNTER — Encounter: Payer: Self-pay | Admitting: Family Medicine

## 2018-10-15 ENCOUNTER — Other Ambulatory Visit: Payer: Self-pay

## 2018-10-15 ENCOUNTER — Ambulatory Visit (INDEPENDENT_AMBULATORY_CARE_PROVIDER_SITE_OTHER): Payer: No Typology Code available for payment source | Admitting: Family Medicine

## 2018-10-15 VITALS — BP 140/77 | HR 67 | Temp 98.5°F | Resp 16 | Ht 67.0 in | Wt 240.0 lb

## 2018-10-15 DIAGNOSIS — N529 Male erectile dysfunction, unspecified: Secondary | ICD-10-CM | POA: Diagnosis not present

## 2018-10-15 DIAGNOSIS — E785 Hyperlipidemia, unspecified: Secondary | ICD-10-CM

## 2018-10-15 DIAGNOSIS — E039 Hypothyroidism, unspecified: Secondary | ICD-10-CM | POA: Diagnosis not present

## 2018-10-15 DIAGNOSIS — E1169 Type 2 diabetes mellitus with other specified complication: Secondary | ICD-10-CM

## 2018-10-15 DIAGNOSIS — E113293 Type 2 diabetes mellitus with mild nonproliferative diabetic retinopathy without macular edema, bilateral: Secondary | ICD-10-CM

## 2018-10-15 MED ORDER — METFORMIN HCL 500 MG PO TABS: 500.0000 mg | ORAL_TABLET | Freq: Two times a day (BID) | ORAL | 1 refills | Status: DC

## 2018-10-15 MED ORDER — ATORVASTATIN CALCIUM 20 MG PO TABS: 20.0000 mg | ORAL_TABLET | Freq: Every day | ORAL | 3 refills | Status: DC

## 2018-10-15 MED ORDER — SILDENAFIL CITRATE 20 MG PO TABS: ORAL_TABLET | ORAL | 3 refills | Status: DC

## 2018-10-15 NOTE — Patient Instructions (Addendum)
Thank you for coming to the office today.  Recent Labs    03/05/18 0804 07/10/18 1007 10/01/18 0813  HGBA1C 7.8* 8.5* 8.1*    Increase metformin now to 1 pill twice a day  If does not work or causes side effect - we can consider add on new medicine Rybelsus - this is the oral form of ozempic, excellent medicine, one a day taken on empty stomach no other med, 30 min prior to first meal and other meds, start at dose 3mg  then increase up to dose 7mg , helps with weight loss, A1c reduction and protects heart  Trial on Sildenafil as needed, call if need more - this was sent to [facility] in [place]  Re ordered Atorvastatin for cholesterol  Please schedule a Follow-up Appointment to: Return in about 3 months (around 01/15/2019) for 3 month follow-up DM A1c, med adjust, ED.  If you have any other questions or concerns, please feel free to call the office or send a message through [place]. You may also schedule an earlier appointment if necessary.  Additionally, you may be receiving a survey about your experience at our office within a few days to 1 week by e-mail or mail. We value your feedback.  [name], DO [place]

## 2018-10-15 NOTE — Progress Notes (Signed)
Subjective:    Patient ID: Shane Picket., male    DOB: 12/05/1965, 53 y.o.   MRN: [id number]  Shane Ravert. is a 53 y.o. male presenting on 10/15/2018 for Diabetes (follow up after test result)   HPI   CHRONIC DM, Type 2: A1c improved to 8.1 CBGs:Home readings reviewed avg 150-160s Meds:Metformin 500mg  BID (taking once daily due to side effect), Glimepiride 4mg  daily Reports good compliance. Tolerating well w/o side-effects Currently on ACEi Lifestyle: - Diet (trying to improve but not always following diabetes diet) Due to have DM Eye at [facility] - will request record Denies hypoglycemia  Hypothyroidism Previously suboptimal control. Last lab showed normal TSH. Continues to take Levothyroxine 231mcg daily.  HYPERLIPIDEMIA: - Reports no concerns. Last lipid panel 02/2018, mild elevated LDL 139s - Currently taking atorvastatin 20mg  in past, Shane Frazier was out of this for while, tolerating well without side effects or myalgias Needs refill  Erectile Dysfunction - Reports difficulty with maintaining erection, able to get erection but loses it quickly, more frequent occurrences over past 1 year. Tried years ago cialis and possibly viagra, mixed results - States Shane Frazier remains sexually active with his wife, no other partners - Admits to good sexual desire. Does get nocturnal erections.   Depression screen [facility] 2/9 10/15/2018 03/23/2018 03/09/2018  Decreased Interest 0 0 0  Down, Depressed, Hopeless 0 0 0  PHQ - 2 Score 0 0 0    Social History   Tobacco Use  . Smoking status: Never Smoker  . Smokeless tobacco: Current User    Types: Chew  Substance Use Topics  . Alcohol use: Yes    Comment: occasionally. rarely  . Drug use: No    Review of Systems Per HPI unless specifically indicated above     Objective:    BP 140/77   Pulse 67   Temp 98.5 F (36.9 C) (Oral)   Resp 16   Ht 5\' 7"  (1.702 m)   Wt 240 lb (108.9 kg)   BMI 37.59 kg/m   Wt Readings from Last 3  Encounters:  10/15/18 240 lb (108.9 kg)  04/20/18 228 lb (103.4 kg)  03/30/18 223 lb 12.8 oz (101.5 kg)    Physical Exam Vitals signs and nursing note reviewed.  Constitutional:      General: Shane Frazier is not in acute distress.    Appearance: Shane Frazier is well-developed. Shane Frazier is not diaphoretic.     Comments: Well-appearing, comfortable, cooperative  HENT:     Head: Normocephalic and atraumatic.  Eyes:     General:        Right eye: No discharge.        Left eye: No discharge.     Conjunctiva/sclera: Conjunctivae normal.  Cardiovascular:     Rate and Rhythm: Normal rate.  Pulmonary:     Effort: Pulmonary effort is normal.  Skin:    General: Skin is warm and dry.     Findings: No erythema or rash.  Neurological:     Mental Status: Shane Frazier is alert and oriented to person, place, and time.  Psychiatric:        Behavior: Behavior normal.     Comments: Well groomed, good eye contact, normal speech and thoughts    Results for orders placed or performed in visit on 10/01/18  T4, free  Result Value Ref Range   Free T4 1.7 0.8 - 1.8 ng/dL  TSH  Result Value Ref Range   TSH 1.17 0.40 - 4.50  mIU/L  Hemoglobin A1c  Result Value Ref Range   Hgb A1c MFr Bld 8.1 (H) <5.7 % of total Hgb   Mean Plasma Glucose 186 (calc)   eAG (mmol/L) 10.3 (calc)      Assessment & Plan:   Problem List Items Addressed This Visit    Hyperlipidemia associated with type 2 diabetes mellitus ([place])    Elevated LDL, not controlled, non adherence to statin Last lipid panel 02/2018  Plan: 1. Continue current meds - Atorvastatin 20mg  daily - REFILL 2. Encourage improved lifestyle - low carb/cholesterol, reduce portion size, continue improving regular exercise      Relevant Medications   atorvastatin (LIPITOR) 20 MG tablet   metFORMIN (GLUCOPHAGE) 500 MG tablet   Hypothyroidism    Controlled clinically Normal TSH Continue current Levothyroxine 239mcg daily      Type 2 diabetes mellitus with diabetic retinopathy  ([place]) - Primary    Improved DM control A1c 8.1 With some hyperglycemia Without Hypoglycemia Complicated by DM retinopathy - requesting eye exam report for 2019  Plan:  1. Continue current therapy - Metformin 500mg  BID 2. Encourage improved lifestyle - low carb, low sugar diet, reduce portion size, continue improving regular exercise 3. Check CBG, bring log to next visit for review F/u 3 mo      Relevant Medications   atorvastatin (LIPITOR) 20 MG tablet   metFORMIN (GLUCOPHAGE) 500 MG tablet    Other Visit Diagnoses    Erectile dysfunction, unspecified erectile dysfunction type       Relevant Medications   sildenafil (REVATIO) 20 MG tablet      Meds ordered this encounter  Medications  . atorvastatin (LIPITOR) 20 MG tablet    Sig: Take 1 tablet (20 mg total) by mouth at bedtime.    Dispense:  90 tablet    Refill:  3  . metFORMIN (GLUCOPHAGE) 500 MG tablet    Sig: Take 1 tablet (500 mg total) by mouth 2 (two) times daily with a meal.    Dispense:  180 tablet    Refill:  1  . sildenafil (REVATIO) 20 MG tablet    Sig: Take 1-5 pills about 30 min prior to sex. Start with 1 and increase as needed.    Dispense:  30 tablet    Refill:  3     Follow up plan: Return in about 3 months (around 01/15/2019) for 3 month follow-up DM A1c, med adjust, ED.   [name], [facility] 10/15/2018, 10:09 AM

## 2018-10-16 DIAGNOSIS — E113293 Type 2 diabetes mellitus with mild nonproliferative diabetic retinopathy without macular edema, bilateral: Secondary | ICD-10-CM

## 2018-10-16 MED ORDER — ACCU-CHEK AVIVA VI STRP: ORAL_STRIP | 12 refills | Status: DC

## 2018-10-16 MED ORDER — ACCU-CHEK FASTCLIX LANCETS MISC: 12 refills | Status: DC

## 2018-10-16 NOTE — Assessment & Plan Note (Signed)
Controlled clinically Normal TSH Continue current Levothyroxine 285mcg daily

## 2018-10-16 NOTE — Assessment & Plan Note (Signed)
Improved DM control A1c 8.1 With some hyperglycemia Without Hypoglycemia Complicated by DM retinopathy - requesting eye exam report for 2019  Plan:  1. Continue current therapy - Metformin 500mg  BID 2. Encourage improved lifestyle - low carb, low sugar diet, reduce portion size, continue improving regular exercise 3. Check CBG, bring log to next visit for review F/u 3 mo

## 2018-10-16 NOTE — Assessment & Plan Note (Signed)
Elevated LDL, not controlled, non adherence to statin Last lipid panel 02/2018  Plan: 1. Continue current meds - Atorvastatin 20mg  daily - REFILL 2. Encourage improved lifestyle - low carb/cholesterol, reduce portion size, continue improving regular exercise

## 2018-10-17 MED ORDER — LISINOPRIL 20 MG PO TABS: 20.0000 mg | ORAL_TABLET | ORAL | 3 refills | Status: DC

## 2018-10-17 NOTE — Addendum Note (Signed)
Addended by: Olin Hauser on: 10/17/2018 11:49 AM   Modules accepted: Orders

## 2018-11-26 ENCOUNTER — Other Ambulatory Visit: Payer: Self-pay

## 2018-11-26 ENCOUNTER — Telehealth: Payer: Self-pay | Admitting: Gastroenterology

## 2018-11-26 ENCOUNTER — Telehealth: Payer: Self-pay | Admitting: Pulmonary Disease

## 2018-11-26 DIAGNOSIS — R6882 Decreased libido: Secondary | ICD-10-CM

## 2018-11-26 DIAGNOSIS — N529 Male erectile dysfunction, unspecified: Secondary | ICD-10-CM

## 2018-11-26 DIAGNOSIS — Z1211 Encounter for screening for malignant neoplasm of colon: Secondary | ICD-10-CM

## 2018-11-26 NOTE — Telephone Encounter (Signed)
Called and spoke to [place], who is requesting sleep study for pt.  I have made [place] aware that sleep study would be need to be ordered by either Dr. Alva Garnet or PCP.  Kristeen Miss stated that she would reach out to PCP regarding this matter.  Nothing further is needed at this time.

## 2018-11-26 NOTE — Telephone Encounter (Signed)
Pt wife left vm to reschedule a colonoscopy for pt please call pt or herself to reschedule

## 2018-11-26 NOTE — Telephone Encounter (Signed)
Returned patients call LVM for him to call office back to schedule colonoscopy.  Thanks [employer]

## 2018-12-04 ENCOUNTER — Other Ambulatory Visit: Payer: Self-pay

## 2018-12-04 ENCOUNTER — Other Ambulatory Visit
Admission: RE | Admit: 2018-12-04 | Discharge: 2018-12-04 | Disposition: A | Discharge status: Home / Self Care | Payer: No Typology Code available for payment source | Source: Ambulatory Visit | Attending: Gastroenterology | Admitting: Gastroenterology

## 2018-12-04 DIAGNOSIS — Z01812 Encounter for preprocedural laboratory examination: Secondary | ICD-10-CM | POA: Insufficient documentation

## 2018-12-04 DIAGNOSIS — Z20828 Contact with and (suspected) exposure to other viral communicable diseases: Secondary | ICD-10-CM | POA: Diagnosis not present

## 2018-12-04 LAB — SARS CORONAVIRUS 2 (TAT 6-24 HRS): SARS Coronavirus 2: NEGATIVE

## 2018-12-07 ENCOUNTER — Other Ambulatory Visit: Payer: Self-pay

## 2018-12-07 ENCOUNTER — Ambulatory Visit: Payer: No Typology Code available for payment source | Admitting: Anesthesiology

## 2018-12-07 ENCOUNTER — Encounter
Admission: RE | Disposition: A | Discharge status: Home / Self Care | Payer: Self-pay | Source: Home / Self Care | Attending: Gastroenterology

## 2018-12-07 ENCOUNTER — Ambulatory Visit
Admission: RE | Admit: 2018-12-07 | Discharge: 2018-12-07 | Disposition: A | Discharge status: Home / Self Care | Payer: No Typology Code available for payment source | Attending: Gastroenterology | Admitting: Gastroenterology

## 2018-12-07 DIAGNOSIS — K635 Polyp of colon: Secondary | ICD-10-CM | POA: Diagnosis not present

## 2018-12-07 DIAGNOSIS — E039 Hypothyroidism, unspecified: Secondary | ICD-10-CM | POA: Diagnosis not present

## 2018-12-07 DIAGNOSIS — E119 Type 2 diabetes mellitus without complications: Secondary | ICD-10-CM | POA: Diagnosis not present

## 2018-12-07 DIAGNOSIS — Z7984 Long term (current) use of oral hypoglycemic drugs: Secondary | ICD-10-CM | POA: Insufficient documentation

## 2018-12-07 DIAGNOSIS — Z1211 Encounter for screening for malignant neoplasm of colon: Secondary | ICD-10-CM | POA: Diagnosis present

## 2018-12-07 DIAGNOSIS — D120 Benign neoplasm of cecum: Secondary | ICD-10-CM | POA: Insufficient documentation

## 2018-12-07 DIAGNOSIS — Z79890 Hormone replacement therapy (postmenopausal): Secondary | ICD-10-CM | POA: Insufficient documentation

## 2018-12-07 DIAGNOSIS — Z79899 Other long term (current) drug therapy: Secondary | ICD-10-CM | POA: Diagnosis not present

## 2018-12-07 DIAGNOSIS — I110 Hypertensive heart disease with heart failure: Secondary | ICD-10-CM | POA: Insufficient documentation

## 2018-12-07 DIAGNOSIS — I509 Heart failure, unspecified: Secondary | ICD-10-CM | POA: Insufficient documentation

## 2018-12-07 HISTORY — PX: COLONOSCOPY WITH PROPOFOL: SHX5780

## 2018-12-07 LAB — GLUCOSE, CAPILLARY: Glucose-Capillary: 181 mg/dL — ABNORMAL HIGH (ref 70–99)

## 2018-12-07 SURGERY — COLONOSCOPY WITH PROPOFOL
Anesthesia: General

## 2018-12-07 MED ORDER — SODIUM CHLORIDE 0.9 % IV SOLN
INTRAVENOUS | Status: DC
  Administered 2018-12-07: 09:00:00 via INTRAVENOUS

## 2018-12-07 MED ORDER — PROPOFOL 10 MG/ML IV BOLUS
INTRAVENOUS | Status: DC | PRN
  Administered 2018-12-07: 100 mg via INTRAVENOUS

## 2018-12-07 MED ORDER — PROPOFOL 500 MG/50ML IV EMUL
INTRAVENOUS | Status: DC | PRN
  Administered 2018-12-07: 150 ug/kg/min via INTRAVENOUS

## 2018-12-07 NOTE — Anesthesia Postprocedure Evaluation (Signed)
Anesthesia Post Note  Patient: Shane Frazier.  Procedure(s) Performed: COLONOSCOPY WITH PROPOFOL (N/A )  Patient location during evaluation: PACU Anesthesia Type: General Level of consciousness: awake and alert Pain management: pain level controlled Vital Signs Assessment: post-procedure vital signs reviewed and stable Respiratory status: spontaneous breathing, nonlabored ventilation and respiratory function stable Cardiovascular status: blood pressure returned to baseline and stable Postop Assessment: no apparent nausea or vomiting Anesthetic complications: no     Last Vitals:  Vitals:   12/07/18 1007 12/07/18 1017  BP: 99/68 114/82  Pulse: 68 80  Resp: 17 (!) 23  Temp: (!) 36.3 C   SpO2: 95% 100%    Last Pain:  Vitals:   12/07/18 1017  TempSrc:   PainSc: 0-No pain                 Durenda Hurt

## 2018-12-07 NOTE — Op Note (Addendum)
[facility] Gastroenterology Patient Name: Shane Frazier Procedure Date: 12/07/2018 9:42 AM MRN: [id number] Account #: [id number] Date of Birth: 01/10/1966 Admit Type: Outpatient Age: 53 Room: [facility] ENDO ROOM 4 Gender: Male Note Status: Finalized Procedure:            Colonoscopy Indications:          Screening for colorectal malignant neoplasm Providers:            Jonathon Bellows MD, MD Medicines:            Monitored Anesthesia Care Complications:        No immediate complications. Procedure:            Pre-Anesthesia Assessment:                       - Prior to the procedure, a History and Physical was                        performed, and patient medications, allergies and                        sensitivities were reviewed. The patient's tolerance of                        previous anesthesia was reviewed.                       - ASA Grade Assessment: II - A patient with mild                        systemic disease.                       After obtaining informed consent, the colonoscope was                        passed under direct vision. Throughout the procedure,                        the patient's blood pressure, pulse, and oxygen                        saturations were monitored continuously. The                        Colonoscope was introduced through the anus and                        advanced to the the cecum, identified by the                        appendiceal orifice. The colonoscopy was performed with                        ease. The patient tolerated the procedure well. The                        quality of the bowel preparation was excellent. Findings:      The perianal and digital rectal examinations were normal.      A 3 mm polyp was found in the cecum. The polyp was sessile. The polyp  was removed with a cold biopsy forceps. Resection and retrieval were       complete.      The exam was otherwise without abnormality on direct and retroflexion        views. Impression:           - One 3 mm polyp in the cecum, removed with a cold                        biopsy forceps. Resected and retrieved.                       - The examination was otherwise normal on direct and                        retroflexion views. Recommendation:       - Discharge patient to home (with escort).                       - Resume previous diet.                       - Continue present medications.                       - Await pathology results.                       - Repeat colonoscopy for surveillance based on                        pathology results. Procedure Code(s):    --- Professional ---                       [id number], Colonoscopy, flexible; with biopsy, single or                        multiple Diagnosis Code(s):    --- Professional ---                       Z12.11, Encounter for screening for malignant neoplasm                        of colon                       K63.5, Polyp of colon CPT copyright 2019 American Medical Association. All rights reserved. The codes documented in this report are preliminary and upon coder review may  be revised to meet current compliance requirements. Jonathon Bellows, MD Jonathon Bellows MD, MD 12/07/2018 10:05:29 AM This report has been signed electronically. Number of Addenda: 0 Note Initiated On: 12/07/2018 9:42 AM Scope Withdrawal Time: 0 hours 9 minutes 39 seconds  Total Procedure Duration: 0 hours 11 minutes 47 seconds  Estimated Blood Loss: Estimated blood loss: none.      [facility]

## 2018-12-07 NOTE — Anesthesia Post-op Follow-up Note (Signed)
Anesthesia QCDR form completed.        

## 2018-12-07 NOTE — H&P (Signed)
Shane Bellows, MD [address] [place], [place], [place], [address] [address], [place], [place], [place], [address] Phone: [phone]  Fax: [phone]  Primary Care Physician:  Olin Hauser, DO   Pre-Procedure History & Physical: HPI:  Shane Dues. is a 53 y.o. male is here for an colonoscopy.   Past Medical History:  Diagnosis Date  . CHF (congestive heart failure), NYHA class I, acute on chronic, systolic ([place])   . Concussion 2017   cause of seizure after hit head at work  . Diabetes mellitus without complication ([place])   . Difficult intubation    patient unaware of this diagnosis  . Hypothyroidism   . Seizures ([place]) 05-20-15   X 1-PT HIT HIS HEAD AT WORK AND THEN HAD A SEIZURE    Past Surgical History:  Procedure Laterality Date  . FLEXIBLE BRONCHOSCOPY N/A 03/27/2018   Procedure: [place] With C-arm;  Surgeon: [name], MD;  Location: [facility] ORS;  Service: Pulmonary;  Laterality: N/A;  . HERNIA REPAIR     as a child,umbilical  . SHOULDER ARTHROSCOPY WITH ROTATOR CUFF REPAIR AND SUBACROMIAL DECOMPRESSION Left 09/09/2015   Procedure: SHOULDER ARTHROSCOPY, SUBACROMIAL DECOMPRESSION,BICEPS TENSON RELEASE AND MINI OPEN ROTATOR CUFF REPAIR;  Surgeon: Leanor Kail, MD;  Location: [facility] ORS;  Service: Orthopedics;  Laterality: Left;  Marland Kitchen VENTRAL HERNIA REPAIR N/A 10/27/2017   Procedure: HERNIA REPAIR VENTRAL ADULT;  Surgeon: Jules Husbands, MD;  Location: [facility] ORS;  Service: General;  Laterality: N/A;    Prior to Admission medications   Medication Sig Start Date End Date Taking? Authorizing Provider  atorvastatin (LIPITOR) 20 MG tablet Take 1 tablet (20 mg total) by mouth at bedtime. 10/15/18  Yes Karamalegos, Devonne Doughty, DO  diltiazem (MATZIM LA) 420 MG 24 hr tablet Take 1 tablet (420 mg total) by mouth every morning. 08/22/18  Yes Karamalegos, Devonne Doughty, DO  levothyroxine (SYNTHROID) 200 MCG tablet Take 1 tablet (200 mcg  total) by mouth daily before breakfast. 08/22/18  Yes Karamalegos, Devonne Doughty, DO  lisinopril (ZESTRIL) 20 MG tablet Take 1 tablet (20 mg total) by mouth every morning. 10/17/18  Yes Karamalegos, Devonne Doughty, DO  metFORMIN (GLUCOPHAGE) 500 MG tablet Take 1 tablet (500 mg total) by mouth 2 (two) times daily with a meal. 10/15/18  Yes Karamalegos, Devonne Doughty, DO  ACCU-CHEK AVIVA test strip Check blood sugar up to 2 x daily 10/16/18   Olin Hauser, DO  Accu-Chek FastClix Lancets MISC Check sugar up to 2 x daily 10/16/18   Parks Ranger, Devonne Doughty, DO  PROAIR HFA 108 [phone] Base) MCG/ACT inhaler Inhale 2 puffs into the lungs daily as needed. 03/16/18   [provider]  sildenafil (REVATIO) 20 MG tablet Take 1-5 pills about 30 min prior to sex. Start with 1 and increase as needed. 10/15/18   Olin Hauser, DO    Allergies as of 11/27/2018 - Review Complete 10/15/2018  Allergen Reaction Noted  . Other Other (See Comments) 10/11/2017    Family History  Problem Relation Age of Onset  . Hypertension Mother   . Cancer Father   . Diabetes Sister   . Thyroid cancer Sister     Social History   Socioeconomic History  . Marital status: Married    Spouse name: Not on file  . Number of children: Not on file  . Years of education: Not on file  . Highest education level: Not on file  Occupational History  . Not on file  Social Needs  . Financial resource strain: Not on file  . Food insecurity    Worry: Not on file    Inability: Not on file  . Transportation needs    Medical: Not on file    Non-medical: Not on file  Tobacco Use  . Smoking status: Never Smoker  . Smokeless tobacco: Current User    Types: Chew  Substance and Sexual Activity  . Alcohol use: Yes    Comment: occasionally. rarely  . Drug use: No  . Sexual activity: Yes  Lifestyle  . Physical activity    Days per week: Not on file    Minutes per session: Not on file  . Stress: Not on file   Relationships  . Social Herbalist on phone: Not on file    Gets together: Not on file    Attends religious service: Not on file    Active member of club or organization: Not on file    Attends meetings of clubs or organizations: Not on file    Relationship status: Not on file  . Intimate partner violence    Fear of current or ex partner: Not on file    Emotionally abused: Not on file    Physically abused: Not on file    Forced sexual activity: Not on file  Other Topics Concern  . Not on file  Social History Narrative  . Not on file    Review of Systems: See HPI, otherwise negative ROS  Physical Exam: BP 119/83   Pulse 71   Temp (!) 97.5 F (36.4 C) (Tympanic)   Resp 17   Ht _0  (1.727 m)   Wt 106.6 kg   SpO2 97%   BMI 35.73 kg/m  General:   Alert,  pleasant and cooperative in NAD Head:  Normocephalic and atraumatic. Neck:  Supple; no masses or thyromegaly. Lungs:  Clear throughout to auscultation, normal respiratory effort.    Heart:  +S1, +S2, Regular rate and rhythm, No edema. Abdomen:  Soft, nontender and nondistended. Normal bowel sounds, without guarding, and without rebound.   Neurologic:  Alert and  oriented x4;  grossly normal neurologically.  Impression/Plan: Shane Picket. is here for an colonoscopy to be performed for Screening colonoscopy average risk   Risks, benefits, limitations, and alternatives regarding  colonoscopy have been reviewed with the patient.  Questions have been answered.  All parties agreeable.   Shane Bellows, MD  12/07/2018, 9:42 AM

## 2018-12-07 NOTE — Transfer of Care (Signed)
Immediate Anesthesia Transfer of Care Note  Patient: Shane Frazier.  Procedure(s) Performed: COLONOSCOPY WITH PROPOFOL (N/A )  Patient Location: PACU  Anesthesia Type:General  Level of Consciousness: awake and sedated  Airway & Oxygen Therapy: Patient Spontanous Breathing and Patient connected to nasal cannula oxygen  Post-op Assessment: Report given to RN and Post -op Vital signs reviewed and stable  Post vital signs: Reviewed and stable  Last Vitals:  Vitals Value Taken Time  BP    Temp    Pulse    Resp    SpO2      Last Pain:  Vitals:   12/07/18 0824  TempSrc: Tympanic  PainSc: 0-No pain         Complications: No apparent anesthesia complications

## 2018-12-07 NOTE — Anesthesia Procedure Notes (Signed)
Performed by: Marwah Disbro, CRNA Pre-anesthesia Checklist: Patient identified, Emergency Drugs available, Suction available, Patient being monitored and Timeout performed Oxygen Delivery Method: Nasal cannula       

## 2018-12-07 NOTE — Anesthesia Preprocedure Evaluation (Signed)
Anesthesia Evaluation  Patient identified by MRN, date of birth, ID band Patient awake    Reviewed: Allergy & Precautions, H&P , NPO status , Patient's Chart, lab work & pertinent test results  Airway Mallampati: II  TM Distance: >3 FB Neck ROM: full    Dental  (+) Missing   Pulmonary neg pulmonary ROS,           Cardiovascular hypertension, (-) angina+CHF  (-) Past MI and (-) Cardiac Stents   Echo 03/26/28: - Left ventricle: The cavity size was mildly dilated. Wall   thickness was normal. Systolic function was mildly to moderately   reduced. The estimated ejection fraction was in the range of 40%   to 45%. Diffuse hypokinesis. The study is not technically   sufficient to allow evaluation of LV diastolic function. - Mitral valve: There was mild regurgitation. - Left atrium: The atrium was moderately dilated. - Pulmonary arteries: Systolic pressure could not be accurately   estimated. - Pericardium, extracardiac: A trivial pericardial effusion was   identified.   Neuro/Psych  Headaches, negative psych ROS   GI/Hepatic negative GI ROS, Neg liver ROS,   Endo/Other  diabetesHypothyroidism   Renal/GU negative Renal ROS  negative genitourinary   Musculoskeletal   Abdominal   Peds  Hematology negative hematology ROS (+)   Anesthesia Other Findings Past Medical History: No date: CHF (congestive heart failure), NYHA class I, acute on  chronic, systolic ([place]) 1610: Concussion     Comment:  cause of seizure after hit head at work No date: Diabetes mellitus without complication ([place]) No date: Difficult intubation     Comment:  patient unaware of this diagnosis No date: Hypothyroidism 05-20-15: Seizures ([place])     Comment:  X 1-PT HIT HIS HEAD AT WORK AND THEN HAD A SEIZURE  Past Surgical History: 03/27/2018: FLEXIBLE BRONCHOSCOPY; N/A     Comment:  Procedure: [place] With               C-arm;   Surgeon: [name], MD;  Location: [facility]               ORS;  Service: Pulmonary;  Laterality: N/A; No date: HERNIA REPAIR     Comment:  as a child,umbilical 9/60/4540: SHOULDER ARTHROSCOPY WITH ROTATOR CUFF REPAIR AND  SUBACROMIAL DECOMPRESSION; Left     Comment:  Procedure: SHOULDER ARTHROSCOPY, SUBACROMIAL               DECOMPRESSION,BICEPS TENSON RELEASE AND MINI OPEN ROTATOR              CUFF REPAIR;  Surgeon: Leanor Kail, MD;  Location:               [facility] ORS;  Service: Orthopedics;  Laterality: Left; 10/27/2017: VENTRAL HERNIA REPAIR; N/A     Comment:  Procedure: HERNIA REPAIR VENTRAL ADULT;  Surgeon: Jules Husbands, MD;  Location: [facility] ORS;  Service: General;                Laterality: N/A;  BMI    Body Mass Index: 35.73 kg/m      Reproductive/Obstetrics negative OB ROS                             Anesthesia Physical Anesthesia Plan  ASA: III  Anesthesia Plan: General   Post-op Pain Management:  Induction:   PONV Risk Score and Plan: Propofol infusion and TIVA  Airway Management Planned:   Additional Equipment:   Intra-op Plan:   Post-operative Plan:   Informed Consent: I have reviewed the patients History and Physical, chart, labs and discussed the procedure including the risks, benefits and alternatives for the proposed anesthesia with the patient or authorized representative who has indicated his/her understanding and acceptance.     Dental Advisory Given  Plan Discussed with: Anesthesiologist and CRNA  Anesthesia Plan Comments:         Anesthesia Quick Evaluation

## 2018-12-10 ENCOUNTER — Encounter: Payer: Self-pay | Admitting: Gastroenterology

## 2018-12-10 LAB — SURGICAL PATHOLOGY

## 2018-12-17 ENCOUNTER — Encounter: Payer: Self-pay | Admitting: Gastroenterology

## 2019-01-07 ENCOUNTER — Ambulatory Visit (INDEPENDENT_AMBULATORY_CARE_PROVIDER_SITE_OTHER): Payer: No Typology Code available for payment source | Admitting: Urology

## 2019-01-07 ENCOUNTER — Encounter: Payer: Self-pay | Admitting: Urology

## 2019-01-07 ENCOUNTER — Other Ambulatory Visit: Payer: Self-pay

## 2019-01-07 VITALS — BP 132/77 | HR 91 | Ht 68.0 in | Wt 239.0 lb

## 2019-01-07 DIAGNOSIS — N5201 Erectile dysfunction due to arterial insufficiency: Secondary | ICD-10-CM | POA: Diagnosis not present

## 2019-01-07 MED ORDER — TADALAFIL 20 MG PO TABS: 20.0000 mg | ORAL_TABLET | Freq: Every day | ORAL | 0 refills | Status: DC | PRN

## 2019-01-07 NOTE — Progress Notes (Signed)
01/07/2019 11:04 AM   Shane Frazier. 12-25-1965 [id number]  Referring provider: Olin Hauser, DO [address] [place],  [place] [address]  Chief Complaint  Patient presents with  . Erectile Dysfunction    HPI: Shane Frazier is a 53 y.o. male seen in consultation at request of Dr. Parks Ranger for evaluation erectile dysfunction.  He presents with a 2-year history of difficulty achieving and maintaining an erection.  He states his erections are almost never firm enough for penetration and the rare times he is able to achieve penetration he has difficulty maintaining the erection. SHIM was 6/25 indicating severe ED.  He tried generic sildenafil up to 100 mg with no improvement in his erections.  He does note mild upward curvature with erections without pain.  He states the curvature would not prohibit intercourse if he was able to achieve a firm erection.  Organic risk factors include hyperlipidemia, diabetes and antihypertensive medications including diltiazem and lisinopril.   PMH: Past Medical History:  Diagnosis Date  . CHF (congestive heart failure), NYHA class I, acute on chronic, systolic ([place])   . Concussion 2017   cause of seizure after hit head at work  . Diabetes mellitus without complication ([place])   . Difficult intubation    patient unaware of this diagnosis  . ED (erectile dysfunction)   . Hypothyroidism   . Seizures ([place]) 05-20-15   X 1-PT HIT HIS HEAD AT WORK AND THEN HAD A SEIZURE    Surgical History: Past Surgical History:  Procedure Laterality Date  . COLONOSCOPY WITH PROPOFOL N/A 12/07/2018   Procedure: COLONOSCOPY WITH PROPOFOL;  Surgeon: Jonathon Bellows, MD;  Location: [facility] ENDOSCOPY;  Service: Gastroenterology;  Laterality: N/A;  . FLEXIBLE BRONCHOSCOPY N/A 03/27/2018   Procedure: [place] With C-arm;  Surgeon: [name], MD;  Location: [facility] ORS;  Service: Pulmonary;  Laterality: N/A;  . HERNIA REPAIR     as a  child,umbilical  . SHOULDER ARTHROSCOPY WITH ROTATOR CUFF REPAIR AND SUBACROMIAL DECOMPRESSION Left 09/09/2015   Procedure: SHOULDER ARTHROSCOPY, SUBACROMIAL DECOMPRESSION,BICEPS TENSON RELEASE AND MINI OPEN ROTATOR CUFF REPAIR;  Surgeon: Leanor Kail, MD;  Location: [facility] ORS;  Service: Orthopedics;  Laterality: Left;  Marland Kitchen VENTRAL HERNIA REPAIR N/A 10/27/2017   Procedure: HERNIA REPAIR VENTRAL ADULT;  Surgeon: Jules Husbands, MD;  Location: [facility] ORS;  Service: General;  Laterality: N/A;    Home Medications:  Allergies as of 01/07/2019      Reactions   Other Other (See Comments)   BRAZILIAN NUTS ONLY-"THROAT CLOSES UP"      Medication List       Accurate as of January 07, 2019 11:04 AM. If you have any questions, ask your nurse or doctor.        STOP taking these medications   ProAir HFA 108 (90 Base) MCG/ACT inhaler Generic drug: albuterol Stopped by: Abbie Sons, MD     TAKE these medications   Accu-Chek Aviva test strip Generic drug: glucose blood Check blood sugar up to 2 x daily   Accu-Chek FastClix Lancets Misc Check sugar up to 2 x daily   atorvastatin 20 MG tablet Commonly known as: LIPITOR Take 1 tablet (20 mg total) by mouth at bedtime.   diltiazem 420 MG 24 hr tablet Commonly known as: Matzim LA Take 1 tablet (420 mg total) by mouth every morning.   levothyroxine 200 MCG tablet Commonly known as: SYNTHROID Take 1 tablet (200 mcg total) by mouth daily  before breakfast.   lisinopril 20 MG tablet Commonly known as: ZESTRIL Take 1 tablet (20 mg total) by mouth every morning.   metFORMIN 500 MG tablet Commonly known as: GLUCOPHAGE Take 1 tablet (500 mg total) by mouth 2 (two) times daily with a meal.   sildenafil 20 MG tablet Commonly known as: REVATIO Take 1-5 pills about 30 min prior to sex. Start with 1 and increase as needed.       Allergies:  Allergies  Allergen Reactions  . Other Other (See Comments)    BRAZILIAN NUTS ONLY-"THROAT  CLOSES UP"    Family History: Family History  Problem Relation Age of Onset  . Hypertension Mother   . Cancer Father   . Diabetes Sister   . Thyroid cancer Sister     Social History:  reports that he has never smoked. His smokeless tobacco use includes chew. He reports current alcohol use. He reports that he does not use drugs.  ROS: UROLOGY Frequent Urination?: No Hard to postpone urination?: No Burning/pain with urination?: No Get up at night to urinate?: No Leakage of urine?: No Urine stream starts and stops?: No Trouble starting stream?: No Do you have to strain to urinate?: No Blood in urine?: No Urinary tract infection?: No Sexually transmitted disease?: No Injury to kidneys or bladder?: No Painful intercourse?: No Weak stream?: No Erection problems?: Yes Penile pain?: No  Gastrointestinal Nausea?: No Vomiting?: No Indigestion/heartburn?: No Diarrhea?: No Constipation?: No  Constitutional Fever: No Night sweats?: No Weight loss?: No Fatigue?: No  Skin Skin rash/lesions?: No Itching?: No  Eyes Blurred vision?: No Double vision?: No  Ears/Nose/Throat Sore throat?: No Sinus problems?: No  Hematologic/Lymphatic Swollen glands?: No Easy bruising?: No  Cardiovascular Leg swelling?: No Chest pain?: No  Respiratory Cough?: No Shortness of breath?: No  Endocrine Excessive thirst?: No  Musculoskeletal Back pain?: No Joint pain?: No  Neurological Headaches?: No Dizziness?: No  Psychologic Depression?: No Anxiety?: No  Physical Exam: BP 132/77 (BP Location: Left Arm, Patient Position: Sitting, Cuff Size: Large)   Pulse 91   Ht _0  (1.727 m)   Wt 239 lb (108.4 kg)   BMI 36.34 kg/m   Constitutional:  Alert and oriented, No acute distress. HEENT: [place] AT, moist mucus membranes.  Trachea midline, no masses. Cardiovascular: No clubbing, cyanosis, or edema. Respiratory: Normal respiratory effort, no increased work of breathing. GI:  Abdomen is soft, nontender, nondistended, no abdominal masses GU: Penis with a 1 cm mid shaft dorsal plaque.  Testes descended bilaterally without masses or tenderness.  Estimated volume 20 cc bilaterally.  Spermatic cord/epididymis palpably normal bilaterally. Skin: No rashes, bruises or suspicious lesions. Neurologic: Grossly intact, no focal deficits, moving all 4 extremities. Psychiatric: Normal mood and affect.   Assessment & Plan:    - Erectile dysfunction 53 year old male with severe ED and positive organic risk factors.  We discussed the efficacy of PDE 5 inhibitors are equivalent across the board.  He had no improvement with sildenafil 100 mg.  Although it is unlikely he would achieve significant erections with other medications in the class this would be an option.  Intracavernosal injections, vacuum erection devices and penile implant surgery were also discussed.  He would initially like a second PDE 5 inhibitor to try and Rx tadalafil 20 mg was sent to pharmacy.  He was given literature on intracavernosal injections and vacuum erection devices.  He will call back regarding efficacy of tadalafil.   Abbie Sons, [facility] [address], [place], [place] [address] [phone]

## 2019-01-13 ENCOUNTER — Encounter: Payer: Self-pay | Admitting: Urology

## 2019-01-15 ENCOUNTER — Encounter: Payer: Self-pay | Admitting: Family Medicine

## 2019-01-15 ENCOUNTER — Ambulatory Visit: Payer: No Typology Code available for payment source | Admitting: Family Medicine

## 2019-01-15 ENCOUNTER — Other Ambulatory Visit: Payer: Self-pay | Admitting: Family Medicine

## 2019-01-15 ENCOUNTER — Other Ambulatory Visit: Payer: Self-pay

## 2019-01-15 ENCOUNTER — Ambulatory Visit (INDEPENDENT_AMBULATORY_CARE_PROVIDER_SITE_OTHER): Payer: No Typology Code available for payment source | Admitting: Family Medicine

## 2019-01-15 DIAGNOSIS — Z0000 Encounter for general adult medical examination without abnormal findings: Secondary | ICD-10-CM

## 2019-01-15 DIAGNOSIS — E1169 Type 2 diabetes mellitus with other specified complication: Secondary | ICD-10-CM

## 2019-01-15 DIAGNOSIS — E785 Hyperlipidemia, unspecified: Secondary | ICD-10-CM

## 2019-01-15 DIAGNOSIS — E039 Hypothyroidism, unspecified: Secondary | ICD-10-CM

## 2019-01-15 DIAGNOSIS — I10 Essential (primary) hypertension: Secondary | ICD-10-CM | POA: Diagnosis not present

## 2019-01-15 DIAGNOSIS — E113293 Type 2 diabetes mellitus with mild nonproliferative diabetic retinopathy without macular edema, bilateral: Secondary | ICD-10-CM

## 2019-01-15 DIAGNOSIS — I429 Cardiomyopathy, unspecified: Secondary | ICD-10-CM

## 2019-01-15 DIAGNOSIS — R351 Nocturia: Secondary | ICD-10-CM

## 2019-01-15 NOTE — Assessment & Plan Note (Addendum)
Previously elevated BP, now back under control on current meds - Home BP readings none  Improved creatinine    Plan:  1. Continue current BP regimen - refilled Diltiazem 420mg  daily, also continue other meds Lisinopril 20mg  and HCTZ 25mg  2. Encourage improved lifestyle - low sodium diet, regular exercise

## 2019-01-15 NOTE — Assessment & Plan Note (Addendum)
Improved DM control A1c 8.1 previously, now due for repeat A1c by next visit With some hyperglycemia still reported on CBGs Without Hypoglycemia Complicated by DM retinopathy  Plan:  1. Increase dose Metformin 500mg  BID now up to 1000mg  AM / 500mg  PM 2. Encourage improved lifestyle - low carb, low sugar diet, reduce portion size, continue improving regular exercise 3. Check CBG, bring log to next visit for review  Advised next DM eye visit 01/2019 - needs to schedule

## 2019-01-15 NOTE — Assessment & Plan Note (Signed)
Elevated LDL previously Last lipid panel 02/2018  Plan: 1. Continue current meds - Atorvastatin 20mg  daily 2. Encourage improved lifestyle - low carb/cholesterol, reduce portion size, continue improving regular exercise

## 2019-01-15 NOTE — Progress Notes (Signed)
Virtual Visit via Telephone The purpose of this virtual visit is to provide medical care while limiting exposure to the novel coronavirus (COVID19) for both patient and office staff.  Consent was obtained for phone visit:  Yes.   Answered questions that patient had about telehealth interaction:  Yes.   I discussed the limitations, risks, security and privacy concerns of performing an evaluation and management service by telephone. I also discussed with the patient that there may be a patient responsible charge related to this service. The patient expressed understanding and agreed to proceed.  Patient Location: Home Provider Location: [facility])  ---------------------------------------------------------------------- Chief Complaint  Patient presents with  . Diabetes    Med adjustment    S: Reviewed CMA documentation. I have called patient and gathered additional HPI as follows:  CHRONIC DM, Type 2: Recently has had A1c improved to 8.1. CBGs:Home readings reviewed avg 120-130 ,high reading up to 180 Meds:Metformin 500mg  BID with meal Reports good compliance. Tolerating well w/o side-effects Currently on ACEi Lifestyle: - Diet (trying to improve diabetic diet) - Exercise - active Due to have DM Eye at [facility] 01/2019 - will request record Denies hypoglycemia  CHRONIC HTN: Reports home readings good. Current Meds - Lisinopril 20mg  daily   Reports good compliance, took meds today. Tolerating well, w/o complaints. Denies CP, dyspnea, HA, edema, dizziness / lightheadedness  Hypothyroidism Previously suboptimal control. Last lab showed normal TSH. Continues to take Levothyroxine 249mcg daily.  HYPERLIPIDEMIA: - Reports no concerns. Last lipid panel 02/2018, mild elevated LDL 139s - Currently taking atorvastatin 20mg  in past, he was out of this for while, tolerating well without side effects or myalgias  Erectile Dysfunction - followed by [facility] Dr  Bernardo Heater, on tadalafil now as trial  Denies any high risk travel to areas of current concern for COVID19. Denies any known or suspected exposure to person with or possibly with COVID19.  Denies any fevers, chills, sweats, body ache, cough, shortness of breath, sinus pain or pressure, headache, abdominal pain, diarrhea  Past Medical History:  Diagnosis Date  . CHF (congestive heart failure), NYHA class I, acute on chronic, systolic ([place])   . Concussion 2017   cause of seizure after hit head at work  . Difficult intubation    patient unaware of this diagnosis  . ED (erectile dysfunction)   . Hypothyroidism   . Seizures ([place]) 05-20-15   X 1-PT HIT HIS HEAD AT WORK AND THEN HAD A SEIZURE   Social History   Tobacco Use  . Smoking status: Never Smoker  . Smokeless tobacco: Current User    Types: Chew  Substance Use Topics  . Alcohol use: Yes    Comment: occasionally. rarely  . Drug use: No    Current Outpatient Medications:  .  ACCU-CHEK AVIVA test strip, Check blood sugar up to 2 x daily, Disp: 100 each, Rfl: 12 .  Accu-Chek FastClix Lancets MISC, Check sugar up to 2 x daily, Disp: 102 each, Rfl: 12 .  atorvastatin (LIPITOR) 20 MG tablet, Take 1 tablet (20 mg total) by mouth at bedtime., Disp: 90 tablet, Rfl: 3 .  diltiazem (MATZIM LA) 420 MG 24 hr tablet, Take 1 tablet (420 mg total) by mouth every morning., Disp: 90 tablet, Rfl: 1 .  levothyroxine (SYNTHROID) 200 MCG tablet, Take 1 tablet (200 mcg total) by mouth daily before breakfast., Disp: 90 tablet, Rfl: 1 .  lisinopril (ZESTRIL) 20 MG tablet, Take 1 tablet (20 mg total) by mouth  every morning., Disp: 90 tablet, Rfl: 3 .  metFORMIN (GLUCOPHAGE) 500 MG tablet, Take 2 tabs (1000mg ) in AM and 1 tab (500mg ) in PM, Disp: 180 tablet, Rfl: 1 .  sildenafil (REVATIO) 20 MG tablet, Take 1-5 pills about 30 min prior to sex. Start with 1 and increase as needed., Disp: 30 tablet, Rfl: 3 .  tadalafil (CIALIS) 20 MG tablet, Take 1 tablet (20  mg total) by mouth daily as needed for erectile dysfunction., Disp: 5 tablet, Rfl: 0  Depression screen [facility] 2/9 10/15/2018 03/23/2018 03/09/2018  Decreased Interest 0 0 0  Down, Depressed, Hopeless 0 0 0  PHQ - 2 Score 0 0 0    No flowsheet data found.  -------------------------------------------------------------------------- O: No physical exam performed due to remote telephone encounter.  Lab results reviewed.  Recent Labs    03/05/18 0804 07/10/18 1007 10/01/18 0813  HGBA1C 7.8* 8.5* 8.1*     Recent Results (from the past 2160 hour(s))  SARS CORONAVIRUS 2 Nasal Swab Aptima Multi Swab     Status: None   Collection Time: 12/04/18 10:32 AM   Specimen: Aptima Multi Swab; Nasal Swab  Result Value Ref Range   SARS Coronavirus 2 NEGATIVE NEGATIVE    Comment: (NOTE) SARS-CoV-2 target nucleic acids are NOT DETECTED. The SARS-CoV-2 RNA is generally detectable in upper and lower respiratory specimens during the acute phase of infection. Negative results do not preclude SARS-CoV-2 infection, do not rule out co-infections with other pathogens, and should not be used as the sole basis for treatment or other patient management decisions. Negative results must be combined with clinical observations, patient history, and epidemiological information. The expected result is Negative. Fact Sheet for Patients: SugarRoll.be Fact Sheet for Healthcare Providers: https://www.woods-mathews.com/ This test is not yet approved or cleared by the [place] FDA and  has been authorized for detection and/or diagnosis of SARS-CoV-2 by FDA under an Emergency Use Authorization (EUA). This EUA will remain  in effect (meaning this test can be used) for the duration of the COVID-19 declaration under Section 56 4(b)(1) of the Act, 21 U.S.C. section 360bbb-3(b)(1), unless the authorization is terminated or revoked sooner. Performed at [facility],  [place] [address], [place], [place] [address]   Glucose, capillary     Status: Abnormal   Collection Time: 12/07/18  8:28 AM  Result Value Ref Range   Glucose-Capillary 181 (H) 70 - 99 mg/dL  Surgical pathology     Status: None   Collection Time: 12/07/18  9:55 AM  Result Value Ref Range   SURGICAL PATHOLOGY      Surgical Pathology CASE: [id number] PATIENT: Serita Grammes Surgical Pathology Report     SPECIMEN SUBMITTED: A. Colon polyp, cecum; cbx  CLINICAL HISTORY: None provided  PRE-OPERATIVE DIAGNOSIS: Screening colonoscopy  POST-OPERATIVE DIAGNOSIS: Polyps    DIAGNOSIS: A.  COLON POLYP, CECUM; COLD BIOPSY: - TUBULAR ADENOMA. - NEGATIVE FOR HIGH-GRADE DYSPLASIA AND MALIGNANCY.  GROSS DESCRIPTION: A. Labeled: C BX cecal polyp Received: Formalin Tissue fragment(s): 1 Size: 0.8 cm Description: Tan soft tissue fragment Entirely submitted in 1 cassette.    Final Diagnosis performed by Allena Napoleon, MD.   Electronically signed 12/10/2018 2:41:34PM The electronic signature indicates that the named Attending Pathologist has evaluated the specimen  Technical component performed at [facility], [address] [place], [place] [address] Lab: [phone] Dir: Rush Farmer, MD, MMM  Professional component performed at [facility], [facility], [address] [place], [place], [place] [address] Lab: [phone] Dir: Dellia Nims.  Rubinas, MD     -------------------------------------------------------------------------- A&P:  Problem List Items Addressed This Visit    Essential hypertension    Previously elevated BP, now back under control on current meds - Home BP readings none  Improved creatinine    Plan:  1. Continue current BP regimen - refilled Diltiazem 420mg  daily, also continue other meds Lisinopril 20mg  and HCTZ 25mg  2. Encourage improved lifestyle - low sodium diet, regular exercise      Hyperlipidemia associated with type 2 diabetes mellitus (HCC)     Elevated LDL previously Last lipid panel 02/2018  Plan: 1. Continue current meds - Atorvastatin 20mg  daily 2. Encourage improved lifestyle - low carb/cholesterol, reduce portion size, continue improving regular exercise      Relevant Medications   metFORMIN (GLUCOPHAGE) 500 MG tablet   Hypothyroidism    Controlled clinically Normal TSH last lab, will repeat upcoming Continue current Levothyroxine 243mcg daily      Type 2 diabetes mellitus with diabetic retinopathy ([place]) - Primary    Improved DM control A1c 8.1 previously, now due for repeat A1c by next visit With some hyperglycemia still reported on CBGs Without Hypoglycemia Complicated by DM retinopathy  Plan:  1. Increase dose Metformin 500mg  BID now up to 1000mg  AM / 500mg  PM 2. Encourage improved lifestyle - low carb, low sugar diet, reduce portion size, continue improving regular exercise 3. Check CBG, bring log to next visit for review  Advised next DM eye visit 01/2019 - needs to schedule      Relevant Medications   metFORMIN (GLUCOPHAGE) 500 MG tablet      No orders of the defined types were placed in this encounter.   Follow-up: - Return in 2 months for annual physical and labs - Future labs ordered for 03/2019  Patient verbalizes understanding with the above medical recommendations including the limitation of remote medical advice.  Specific follow-up and call-back criteria were given for patient to follow-up or seek medical care more urgently if needed.   - Time spent in direct consultation with patient on phone: 9 minutes  [name], [facility] 01/15/2019, 11:47 AM

## 2019-01-15 NOTE — Assessment & Plan Note (Signed)
Controlled clinically Normal TSH last lab, will repeat upcoming Continue current Levothyroxine 217mcg daily

## 2019-01-15 NOTE — Patient Instructions (Addendum)
Thank you for coming to the office today.  Keep up improving lifestyle  Increase Metformin from 500 twice a day - to take TWO pills in AM and then 1 pill in PM.  Please schedule your Annual Diabetic Eye Exam with Dr Ellin Mayhew in October 2020 - have them send [place] a copy of the eye report.  DUE for FASTING BLOOD WORK (no food or drink after midnight before the lab appointment, only water or coffee without cream/sugar on the morning of)  SCHEDULE "Lab Only" visit in the morning at the clinic for lab draw in 2 MONTHS   - Make sure Lab Only appointment is at about 1 week before your next appointment, so that results will be available  For Lab Results, once available within 2-3 days of blood draw, you can can log in to MyChart online to view your results and a brief explanation. Also, we can discuss results at next follow-up visit.   Please schedule a Follow-up Appointment to: Return in about 2 months (around 03/17/2019) for Annual Physical.  If you have any other questions or concerns, please feel free to call the office or send a message through [place]. You may also schedule an earlier appointment if necessary.  Additionally, you may be receiving a survey about your experience at our office within a few days to 1 week by e-mail or mail. We value your feedback.  [name], DO [place]

## 2019-02-01 IMAGING — CR DG CHEST 2V
2 series · 2 of 2 positions shown · non-contrast
Comparison: Radiographs March 02, 2018.

CLINICAL DATA: Shortness of breath.

EXAM:
CHEST - 2 VIEW

[chest pa]
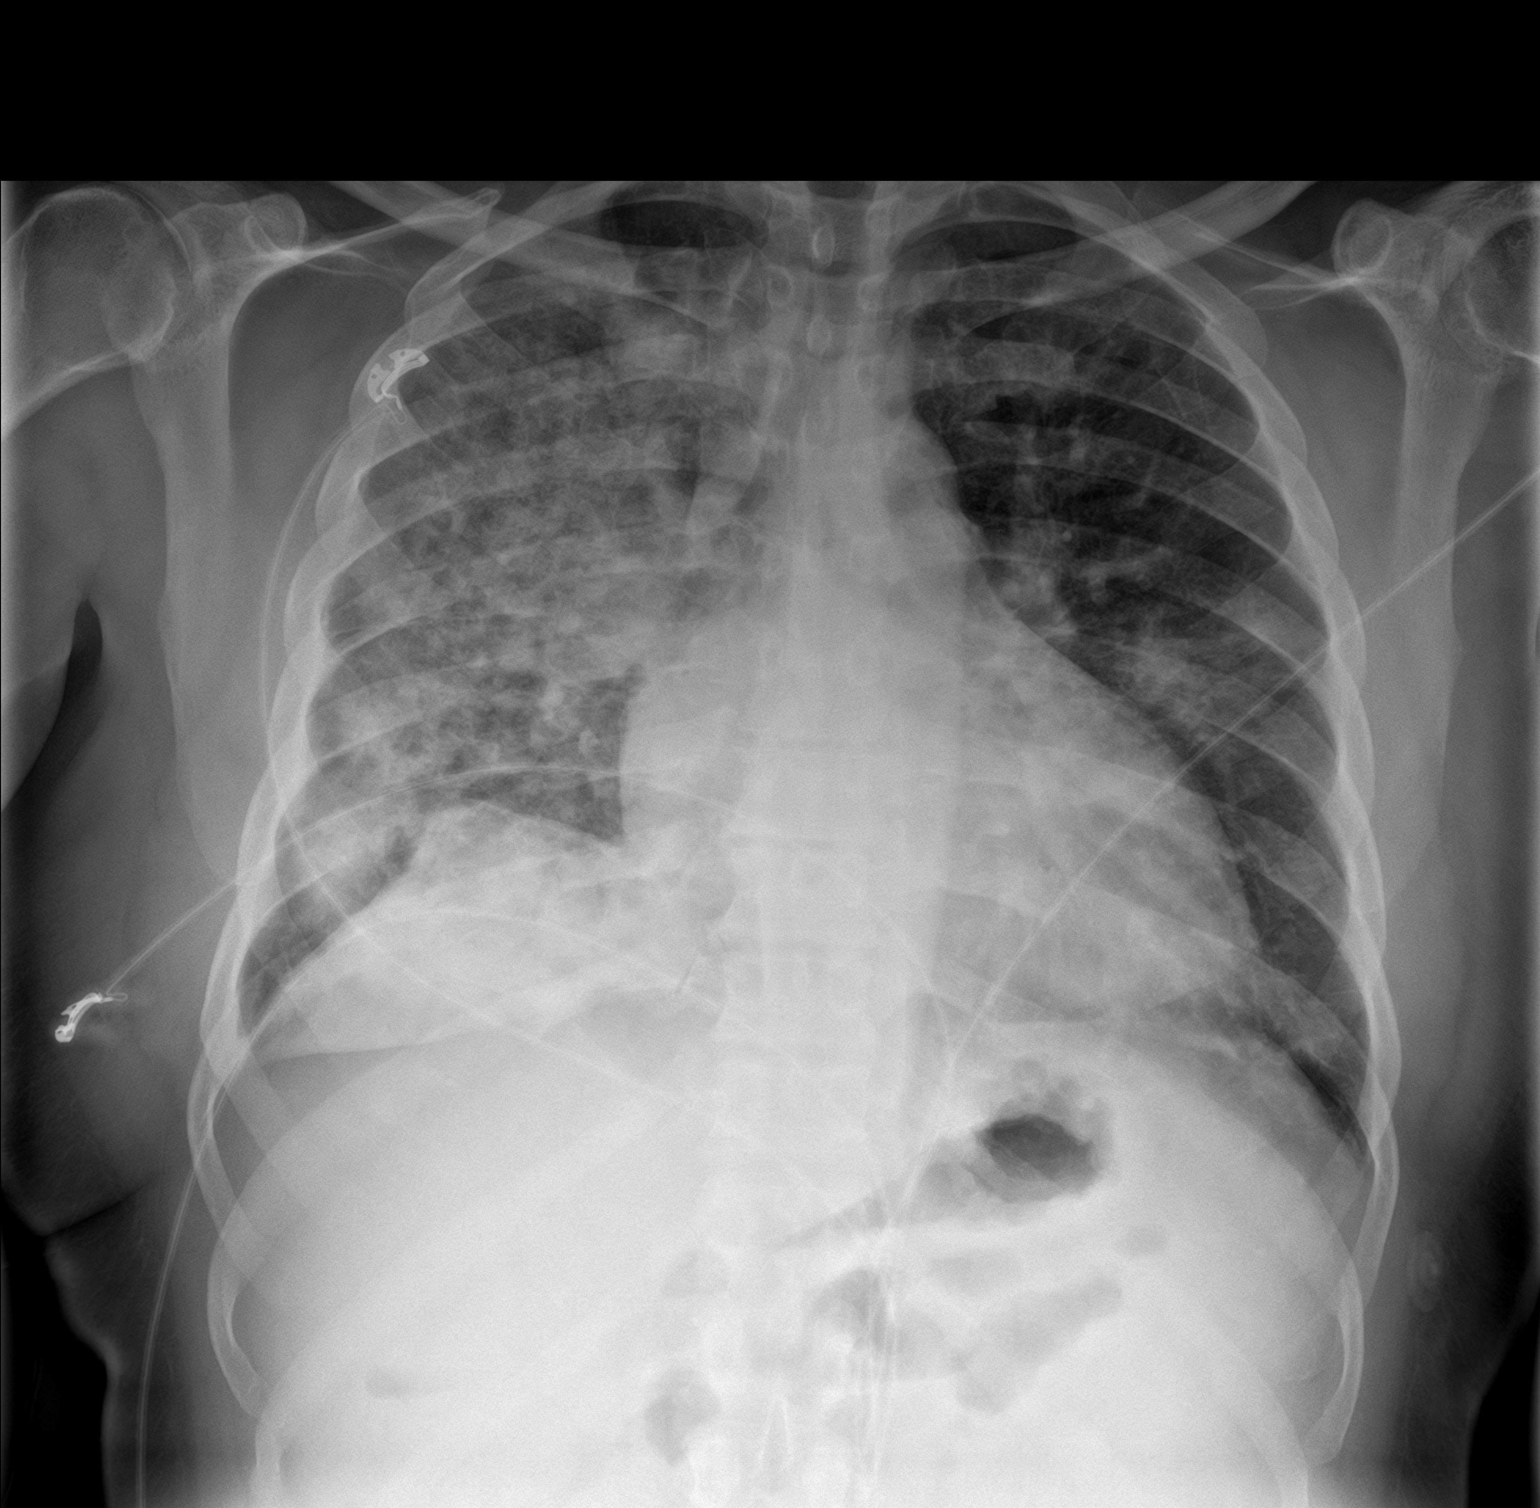

[chest lat]
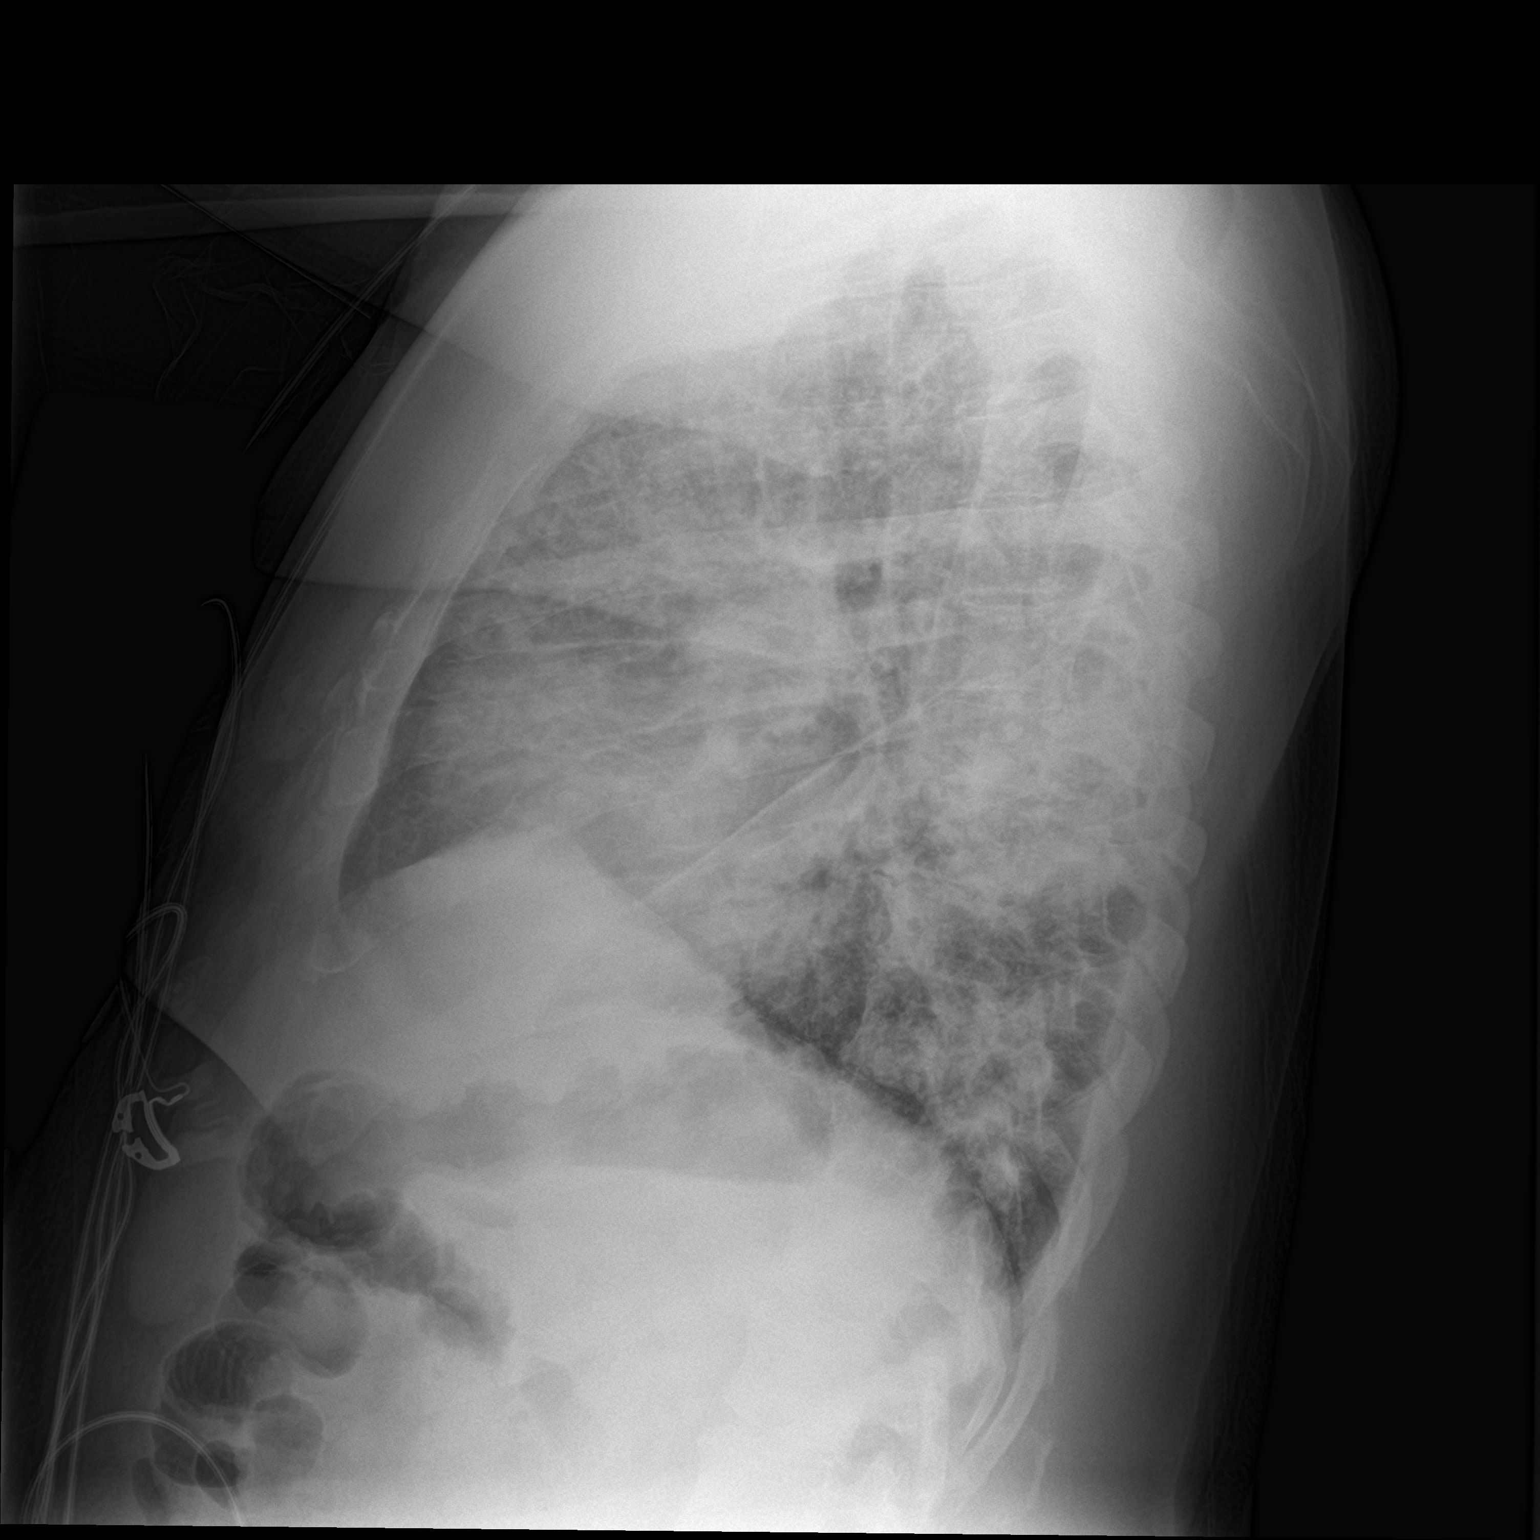

[2 of 2 positions shown; findings below may reference images not displayed]

FINDINGS: The heart size and mediastinal contours are within normal limits. No
pneumothorax or pleural effusion is noted. There is interval
development of diffuse right lung opacity as well as focal lingular
opacity most consistent with multifocal pneumonia. The visualized
skeletal structures are unremarkable.
IMPRESSION: Interval development of bilateral pneumonia, with right much larger
than left.

## 2019-02-04 ENCOUNTER — Other Ambulatory Visit: Payer: Self-pay

## 2019-02-04 ENCOUNTER — Other Ambulatory Visit: Payer: No Typology Code available for payment source

## 2019-02-04 DIAGNOSIS — R351 Nocturia: Secondary | ICD-10-CM

## 2019-02-04 DIAGNOSIS — E113293 Type 2 diabetes mellitus with mild nonproliferative diabetic retinopathy without macular edema, bilateral: Secondary | ICD-10-CM

## 2019-02-04 DIAGNOSIS — I429 Cardiomyopathy, unspecified: Secondary | ICD-10-CM

## 2019-02-04 DIAGNOSIS — E785 Hyperlipidemia, unspecified: Secondary | ICD-10-CM

## 2019-02-04 DIAGNOSIS — I10 Essential (primary) hypertension: Secondary | ICD-10-CM

## 2019-02-04 DIAGNOSIS — E039 Hypothyroidism, unspecified: Secondary | ICD-10-CM

## 2019-02-04 DIAGNOSIS — E1169 Type 2 diabetes mellitus with other specified complication: Secondary | ICD-10-CM

## 2019-02-04 DIAGNOSIS — Z0000 Encounter for general adult medical examination without abnormal findings: Secondary | ICD-10-CM

## 2019-02-04 IMAGING — DX DG CHEST 1V
1 series · 2 of 2 positions shown · non-contrast
Comparison: 03/25/2018

CLINICAL DATA: Pneumonia

EXAM:
CHEST  1 VIEW

[Series 1: chest ap · 0.14mm/px · 2 of 2 slices shown]
[im 1/2]
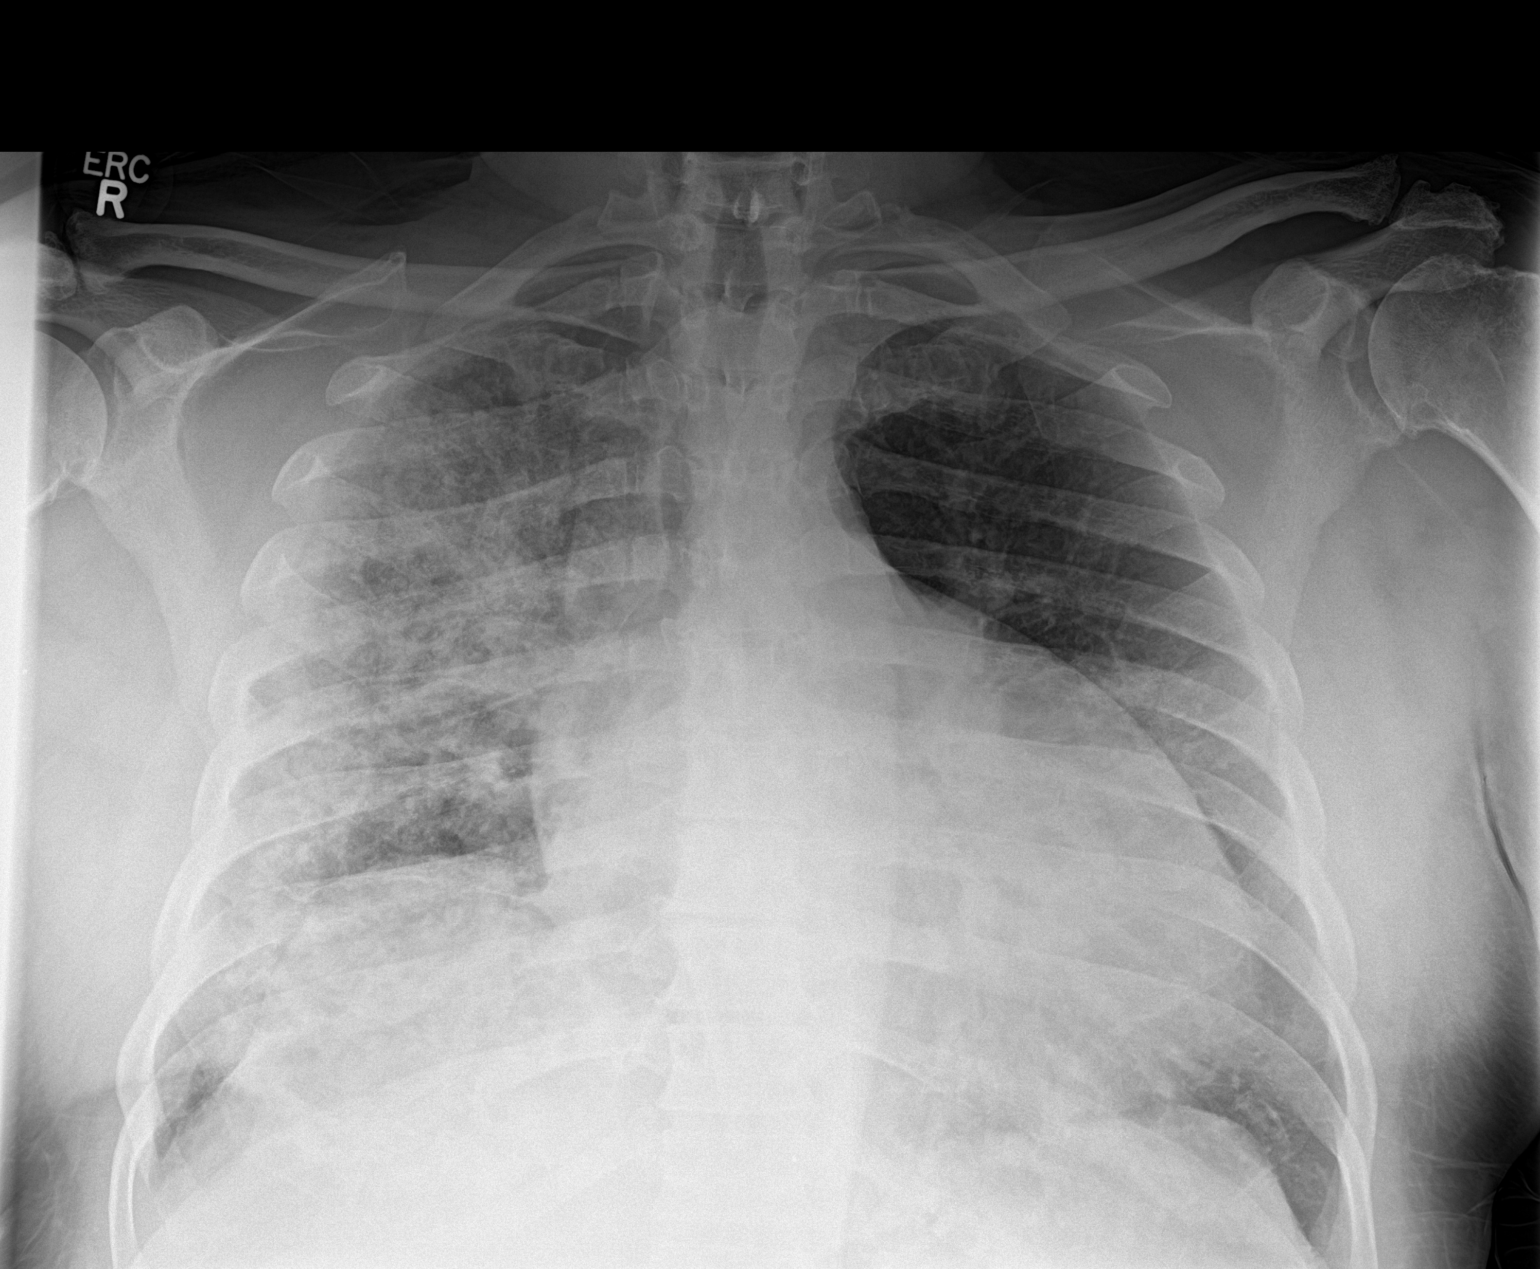
[im 2/2]
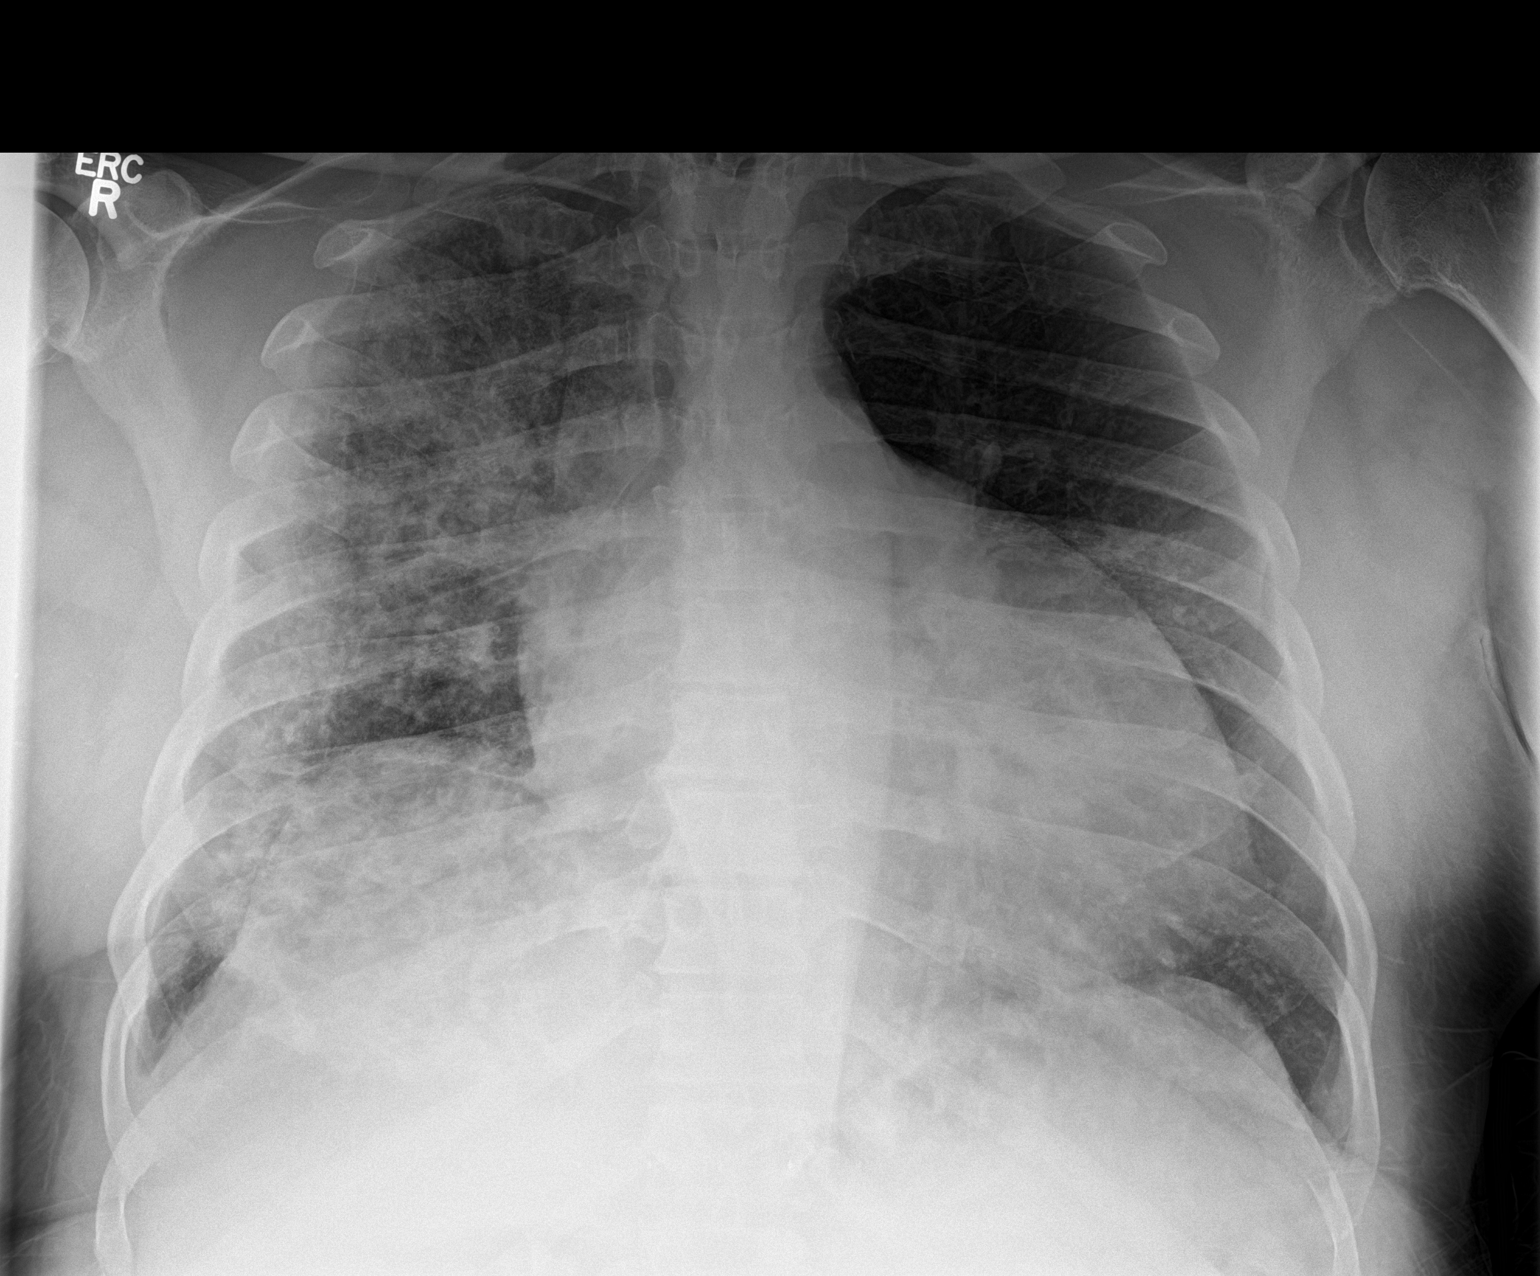

[2 of 2 positions shown; findings below may reference images not displayed]

FINDINGS: Diffuse right lung airspace disease and patchy opacities in the left
mid and lower lung, stable. Cardiomegaly. No effusions. No acute
bony abnormality.
IMPRESSION: Bilateral airspace opacities, right greater than left are stable
since prior study, most compatible with pneumonia.

## 2019-02-05 LAB — COMPLETE METABOLIC PANEL WITH GFR
AG Ratio: 1.4 (calc) (ref 1.0–2.5)
ALT: 15 U/L (ref 9–46)
AST: 15 U/L (ref 10–35)
Albumin: 4.2 g/dL (ref 3.6–5.1)
Alkaline phosphatase (APISO): 83 U/L (ref 35–144)
BUN: 11 mg/dL (ref 7–25)
CO2: 31 mmol/L (ref 20–32)
Calcium: 9.6 mg/dL (ref 8.6–10.3)
Chloride: 102 mmol/L (ref 98–110)
Creat: 1.09 mg/dL (ref 0.70–1.33)
GFR, Est African American: 90 mL/min/{1.73_m2} (ref >=60)
GFR, Est Non African American: 78 mL/min/{1.73_m2} (ref >=60)
Globulin: 3.1 g/dL (calc) (ref 1.9–3.7)
Glucose, Bld: 214 mg/dL — ABNORMAL HIGH (ref 65–99)
Potassium: 4.5 mmol/L (ref 3.5–5.3)
Sodium: 140 mmol/L (ref 135–146)
Total Bilirubin: 0.5 mg/dL (ref 0.2–1.2)
Total Protein: 7.3 g/dL (ref 6.1–8.1)

## 2019-02-05 LAB — CBC WITH DIFFERENTIAL/PLATELET
Absolute Monocytes: 360 cells/uL (ref 200–950)
Basophils Absolute: 32 cells/uL (ref 0–200)
Basophils Relative: 0.6 %
Eosinophils Absolute: 133 cells/uL (ref 15–500)
Eosinophils Relative: 2.5 %
HCT: 45.5 % (ref 38.5–50.0)
Hemoglobin: 15 g/dL (ref 13.2–17.1)
Lymphs Abs: 1473 cells/uL (ref 850–3900)
MCH: 28.5 pg (ref 27.0–33.0)
MCHC: 33 g/dL (ref 32.0–36.0)
MCV: 86.5 fL (ref 80.0–100.0)
MPV: 10.9 fL (ref 7.5–12.5)
Monocytes Relative: 6.8 %
Neutro Abs: 3302 cells/uL (ref 1500–7800)
Neutrophils Relative %: 62.3 %
Platelets: 254 10*3/uL (ref 140–400)
RBC: 5.26 10*6/uL (ref 4.20–5.80)
RDW: 12.6 % (ref 11.0–15.0)
Total Lymphocyte: 27.8 %
WBC: 5.3 10*3/uL (ref 3.8–10.8)

## 2019-02-05 LAB — HEMOGLOBIN A1C
Hgb A1c MFr Bld: 8.9 % of total Hgb — ABNORMAL HIGH (ref <5.7)
Mean Plasma Glucose: 209 (calc)
eAG (mmol/L): 11.6 (calc)

## 2019-02-05 LAB — LIPID PANEL
Cholesterol: 205 mg/dL — ABNORMAL HIGH (ref <200)
HDL: 65 mg/dL (ref >=40)
LDL Cholesterol (Calc): 124 mg/dL (calc) — ABNORMAL HIGH
Non-HDL Cholesterol (Calc): 140 mg/dL (calc) — ABNORMAL HIGH (ref <130)
Total CHOL/HDL Ratio: 3.2 (calc) (ref <5.0)
Triglycerides: 69 mg/dL (ref <150)

## 2019-02-05 LAB — TSH: TSH: 4.25 mIU/L (ref 0.40–4.50)

## 2019-02-05 LAB — PSA: PSA: 0.3 ng/mL (ref <=4.0)

## 2019-02-05 LAB — T4, FREE: Free T4: 1.7 ng/dL (ref 0.8–1.8)

## 2019-02-11 ENCOUNTER — Encounter: Payer: Self-pay | Admitting: Family Medicine

## 2019-02-11 ENCOUNTER — Other Ambulatory Visit: Payer: Self-pay

## 2019-02-11 ENCOUNTER — Ambulatory Visit (INDEPENDENT_AMBULATORY_CARE_PROVIDER_SITE_OTHER): Payer: No Typology Code available for payment source | Admitting: Family Medicine

## 2019-02-11 VITALS — BP 138/82 | HR 91 | Temp 98.8°F | Resp 16 | Ht 68.0 in | Wt 242.0 lb

## 2019-02-11 DIAGNOSIS — Z23 Encounter for immunization: Secondary | ICD-10-CM

## 2019-02-11 DIAGNOSIS — E1169 Type 2 diabetes mellitus with other specified complication: Secondary | ICD-10-CM

## 2019-02-11 DIAGNOSIS — Z0000 Encounter for general adult medical examination without abnormal findings: Secondary | ICD-10-CM | POA: Diagnosis not present

## 2019-02-11 DIAGNOSIS — E113293 Type 2 diabetes mellitus with mild nonproliferative diabetic retinopathy without macular edema, bilateral: Secondary | ICD-10-CM

## 2019-02-11 DIAGNOSIS — I429 Cardiomyopathy, unspecified: Secondary | ICD-10-CM

## 2019-02-11 DIAGNOSIS — I5020 Unspecified systolic (congestive) heart failure: Secondary | ICD-10-CM

## 2019-02-11 DIAGNOSIS — E785 Hyperlipidemia, unspecified: Secondary | ICD-10-CM

## 2019-02-11 DIAGNOSIS — I10 Essential (primary) hypertension: Secondary | ICD-10-CM

## 2019-02-11 DIAGNOSIS — E039 Hypothyroidism, unspecified: Secondary | ICD-10-CM

## 2019-02-11 NOTE — Patient Instructions (Addendum)
Thank you for coming to the office today.  Recent Labs    07/10/18 1007 10/01/18 0813 02/04/19 0826  HGBA1C 8.5* 8.1* 8.9*   Call insurance find cost and coverage of the following  1. Ozempic (Semaglutide injection) - start 0.25mg  weekly for 4 weeks then increase to 0.5mg  weekly - This one has best benefit of weight loss and reducing Cardiovascular events  2. Bydureon BCise (Exenatide ER) - once weekly - this is my preference, very good medicine well tolerated, less side effects of nausea, upset stomach. No dose changes. Cost and coverage is the problem, but we may be able to get it with the coupon card  3. Trulicity (Dulaglutide) - once weekly - this is very good one, usually one of my top choices as well, two doses, 0.75 (likely we would start) and 1.5 max dose. We can use coupon card here too  4. Victoza (Liraglutide) - once DAILY - 3 dose changes 0.6, 1.2 and 1.8, side effects nausea, upset stomach higher on this one but it is still very effective medicine  ---------------------  Flu shot today   Please schedule a Follow-up Appointment to: Return in about 3 months (around 05/14/2019) for 3 month follow-up DM A1c ?new med / Weight.  If you have any other questions or concerns, please feel free to call the office or send a message through [place]. You may also schedule an earlier appointment if necessary.  Additionally, you may be receiving a survey about your experience at our office within a few days to 1 week by e-mail or mail. We value your feedback.  [name], DO [place]

## 2019-02-11 NOTE — Progress Notes (Signed)
Subjective:    Patient ID: Shane Frazier., male    DOB: 09-28-1965, 53 y.o.   MRN: [id number]  Shane Frazier. is a 53 y.o. male presenting on 02/11/2019 for Annual Exam   HPI   Here for Annual Physical and Lab Review.  CHRONIC DM, Type 2: Recently has had A1c improved to 8.1 now up to 8.9 with worse control CBGs:Home readings reviewed, elevated readings Meds:Metformin531m BID with meal Reports good compliance. 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 Current Meds - Lisinopril 279mdaily   Reports good compliance, took meds today. Tolerating well, w/o complaints. Denies CP, dyspnea, HA, edema, dizziness / lightheadedness  Hypothyroidism Previously suboptimal control.Improved now Last lab showed normal TSH. Continues to take Levothyroxine 20045mdaily.  HYPERLIPIDEMIA: - Reports no concerns. Last lipid panel10/2020,mild elevated LDL 120s but improved - Currently takingatorvastatin 7m64m past, he was out of this for while, tolerating well without side effects or myalgias  Erectile Dysfunction - followed by [facility] Dr StoiBernardo Heater tadalafil now as trial 12/2018 - limited results, switched to tadalafil, still have some inadequate result    Upcoming eye doctor apt on Wednesday  Health Maintenance: Due for Flu Shot, will receive today    Depression screen PHQ [facility] 02/11/2019 10/15/2018 03/23/2018  Decreased Interest 0 0 0  Down, Depressed, Hopeless 0 0 0  PHQ - 2 Score 0 0 0    Past Medical History:  Diagnosis Date  . CHF (congestive heart failure), NYHA class I, acute on chronic, systolic (HCC)[place]. Concussion 2017   cause of seizure after  hit head at work  . Difficult intubation    patient unaware of this diagnosis  . ED (erectile dysfunction)   . Hypothyroidism   . Seizures (HCC)[place]1-17   X 1-PT HIT HIS HEAD AT WORK AND THEN HAD A SEIZURE   Past Surgical History:  Procedure Laterality Date  . COLONOSCOPY WITH PROPOFOL N/A 12/07/2018   Procedure: COLONOSCOPY WITH PROPOFOL;  Surgeon: AnnaJonathon Bellows;  [facility];  Service: Gastroenterology;  Laterality: N/A;  . FLEXIBLE BRONCHOSCOPY N/A 03/27/2018   Procedure: FLEXWayneh C-arm;  Surgeon: SimoWilhelmina Mcardle;  Location: [facility] ORS;  Service: Pulmonary;  Laterality: N/A;  . HERNIA REPAIR     as a child,umbilical  . SHOULDER ARTHROSCOPY WITH ROTATOR CUFF REPAIR AND SUBACROMIAL DECOMPRESSION Left 09/09/2015   Procedure: SHOULDER ARTHROSCOPY, SUBACROMIAL DECOMPRESSION,BICEPS TENSON RELEASE AND MINI OPEN ROTATOR CUFF REPAIR;  Surgeon: HaroLeanor Kail;  Location: [facility] ORS;  Service: Orthopedics;  Laterality: Left;  . VEMarland KitchenTRAL HERNIA REPAIR N/A 10/27/2017   Procedure: HERNIA REPAIR VENTRAL ADULT;  Surgeon: PaboJules Husbands;  Location: [facility] ORS;  Service: General;  Laterality: N/A;   Social History   Socioeconomic History  . Marital status: Married    Spouse name: Not on file  . Number of children: Not on file  . Years of education: Not on file  . Highest education level: Not on file  Occupational History  . Not on file  Social Needs  . Financial resource strain: Not on file  . Food insecurity  Worry: Not on file    Inability: Not on file  . Transportation needs    Medical: Not on file    Non-medical: Not on file  Tobacco Use  . Smoking status: Never Smoker  . Smokeless tobacco: Current User    Types: Chew  Substance and Sexual Activity  . Alcohol use: Yes    Comment: occasionally. rarely  . Drug use: No  . Sexual activity: Yes  Lifestyle  . Physical activity    Days per week: Not on file    Minutes per session:  Not on file  . Stress: Not on file  Relationships  . Social Herbalist on phone: Not on file    Gets together: Not on file    Attends religious service: Not on file    Active member of club or organization: Not on file    Attends meetings of clubs or organizations: Not on file    Relationship status: Not on file  . Intimate partner violence    Fear of current or ex partner: Not on file    Emotionally abused: Not on file    Physically abused: Not on file    Forced sexual activity: Not on file  Other Topics Concern  . Not on file  Social History Narrative  . Not on file   Family History  Problem Relation Age of Onset  . Hypertension Mother   . Cancer Father   . Diabetes Sister   . Thyroid cancer Sister    Current Outpatient Medications on File Prior to Visit  Medication Sig  . ACCU-CHEK AVIVA test strip Check blood sugar up to 2 x daily  . Accu-Chek FastClix Lancets MISC Check sugar up to 2 x daily  . atorvastatin (LIPITOR) 20 MG tablet Take 1 tablet (20 mg total) by mouth at bedtime.  Marland Kitchen diltiazem (MATZIM LA) 420 MG 24 hr tablet Take 1 tablet (420 mg total) by mouth every morning.  Marland Kitchen levothyroxine (SYNTHROID) 200 MCG tablet Take 1 tablet (200 mcg total) by mouth daily before breakfast.  . lisinopril (ZESTRIL) 20 MG tablet Take 1 tablet (20 mg total) by mouth every morning.  . metFORMIN (GLUCOPHAGE) 500 MG tablet Take 2 tabs (1057m) in AM and 1 tab (5091m in PM  . sildenafil (REVATIO) 20 MG tablet Take 1-5 pills about 30 min prior to sex. Start with 1 and increase as needed.  . tadalafil (CIALIS) 20 MG tablet Take 1 tablet (20 mg total) by mouth daily as needed for erectile dysfunction.   No current facility-administered medications on file prior to visit.     Review of Systems  Constitutional: Negative for activity change, appetite change, chills, diaphoresis, fatigue and fever.  HENT: Negative for congestion and hearing loss.   Eyes: Negative for visual  disturbance.  Respiratory: Negative for apnea, cough, chest tightness, shortness of breath and wheezing.   Cardiovascular: Negative for chest pain, palpitations and leg swelling.  Gastrointestinal: Negative for abdominal pain, constipation, diarrhea, nausea and vomiting.  Endocrine: Negative for cold intolerance.  Genitourinary: Negative for difficulty urinating, dysuria, frequency and hematuria.  Musculoskeletal: Negative for arthralgias and neck pain.  Skin: Negative for rash.  Allergic/Immunologic: Negative for environmental allergies.  Neurological: Negative for dizziness, weakness, light-headedness, numbness and headaches.  Hematological: Negative for adenopathy.  Psychiatric/Behavioral: Negative for behavioral problems, dysphoric mood and sleep disturbance.   Per HPI unless specifically indicated above      Objective:    BP 138/82 (BP Location:  Left Arm, Cuff Size: Normal)   Pulse 91   Temp 98.8 F (37.1 C) (Oral)   Resp 16   Ht _0  (1.727 m)   Wt 242 lb (109.8 kg)   BMI 36.80 kg/m   Wt Readings from Last 3 Encounters:  02/11/19 242 lb (109.8 kg)  01/07/19 239 lb (108.4 kg)  12/07/18 235 lb (106.6 kg)    Physical Exam Vitals signs and nursing note reviewed.  Constitutional:      General: He is not in acute distress.    Appearance: He is well-developed. He is not diaphoretic.     Comments: Well-appearing, comfortable, cooperative  HENT:     Head: Normocephalic and atraumatic.  Eyes:     General:        Right eye: No discharge.        Left eye: No discharge.     Conjunctiva/sclera: Conjunctivae normal.     Pupils: Pupils are equal, round, and reactive to light.  Neck:     Musculoskeletal: Normal range of motion and neck supple.     Thyroid: No thyromegaly.  Cardiovascular:     Rate and Rhythm: Normal rate and regular rhythm.     Heart sounds: Normal heart sounds. No murmur.  Pulmonary:     Effort: Pulmonary effort is normal. No respiratory distress.      Breath sounds: Normal breath sounds. No wheezing or rales.  Abdominal:     General: Bowel sounds are normal. There is no distension.     Palpations: Abdomen is soft. There is no mass.     Tenderness: There is no abdominal tenderness.  Musculoskeletal: Normal range of motion.        General: No tenderness.     Comments: Upper / Lower Extremities: - Normal muscle tone, strength bilateral upper extremities 5/5, lower extremities 5/5  Lymphadenopathy:     Cervical: No cervical adenopathy.  Skin:    General: Skin is warm and dry.     Findings: No erythema or rash.  Neurological:     Mental Status: He is alert and oriented to person, place, and time.     Comments: Distal sensation intact to light touch all extremities  Psychiatric:        Behavior: Behavior normal.     Comments: Well groomed, good eye contact, normal speech and thoughts      Diabetic Foot Exam - Simple   Simple Foot Form Visual Inspection See comments: Yes Sensation Testing Intact to touch and monofilament testing bilaterally: Yes Pulse Check Posterior Tibialis and Dorsalis pulse intact bilaterally: Yes Comments Bilateral callus formation without ulcer. No loss of sensation.      Results for orders placed or performed in visit on 02/04/19  T4, free  Result Value Ref Range   Free T4 1.7 0.8 - 1.8 ng/dL  TSH  Result Value Ref Range   TSH 4.25 0.40 - 4.50 mIU/L  PSA  Result Value Ref Range   PSA 0.3 < OR = 4.0 ng/mL  Lipid panel  Result Value Ref Range   Cholesterol 205 (H) <200 mg/dL   HDL 65 > OR = 40 mg/dL   Triglycerides 69 <150 mg/dL   LDL Cholesterol (Calc) 124 (H) mg/dL (calc)   Total CHOL/HDL Ratio 3.2 <5.0 (calc)   Non-HDL Cholesterol (Calc) 140 (H) <130 mg/dL (calc)  COMPLETE METABOLIC PANEL WITH GFR  Result Value Ref Range   Glucose, Bld 214 (H) 65 - 99 mg/dL   BUN 11 7 -  25 mg/dL   Creat 1.09 0.70 - 1.33 mg/dL   GFR, Est Non African American 78 > OR = 60 mL/min/1.64m   GFR, Est African  American 90 > OR = 60 mL/min/1.757m  BUN/Creatinine Ratio NOT APPLICABLE 6 - 22 (calc)   Sodium 140 135 - 146 mmol/L   Potassium 4.5 3.5 - 5.3 mmol/L   Chloride 102 98 - 110 mmol/L   CO2 31 20 - 32 mmol/L   Calcium 9.6 8.6 - 10.3 mg/dL   Total Protein 7.3 6.1 - 8.1 g/dL   Albumin 4.2 3.6 - 5.1 g/dL   Globulin 3.1 1.9 - 3.7 g/dL (calc)   AG Ratio 1.4 1.0 - 2.5 (calc)   Total Bilirubin 0.5 0.2 - 1.2 mg/dL   Alkaline phosphatase (APISO) 83 35 - 144 U/L   AST 15 10 - 35 U/L   ALT 15 9 - 46 U/L  CBC with Differential/Platelet  Result Value Ref Range   WBC 5.3 3.8 - 10.8 Thousand/uL   RBC 5.26 4.20 - 5.80 Million/uL   Hemoglobin 15.0 13.2 - 17.1 g/dL   HCT 45.5 38.5 - 50.0 %   MCV 86.5 80.0 - 100.0 fL   MCH 28.5 27.0 - 33.0 pg   MCHC 33.0 32.0 - 36.0 g/dL   RDW 12.6 11.0 - 15.0 %   Platelets 254 140 - 400 Thousand/uL   MPV 10.9 7.5 - 12.5 fL   Neutro Abs 3,302 1,500 - 7,800 cells/uL   Lymphs Abs 1,473 850 - 3,900 cells/uL   Absolute Monocytes 360 200 - 950 cells/uL   Eosinophils Absolute 133 15 - 500 cells/uL   Basophils Absolute 32 0 - 200 cells/uL   Neutrophils Relative % 62.3 %   Total Lymphocyte 27.8 %   Monocytes Relative 6.8 %   Eosinophils Relative 2.5 %   Basophils Relative 0.6 %  Hemoglobin A1c  Result Value Ref Range   Hgb A1c MFr Bld 8.9 (H) <5.7 % of total Hgb   Mean Plasma Glucose 209 (calc)   eAG (mmol/L) 11.6 (calc)      Assessment & Plan:   Problem List Items Addressed This Visit    Type 2 diabetes mellitus with diabetic retinopathy (HCC)    Elevated A1c to 8.9 With some hyperglycemia still reported on CBGs Without Hypoglycemia Complicated by DM retinopathy  Plan:  1. Can maximize dose Metofmrin  2. Encourage improved lifestyle - low carb, low sugar diet, reduce portion size, continue improving regular exercise 3. Check CBG, bring log to next visit for review  Advised next DM eye visit - follow up  If A1c not improved w/ aggressive lifestyle wt  loss then will recommend /  Offer GLP1 medications      RESOLVED: Systolic heart failure secondary to idiopathic cardiomyopathy ([facility]  Morbid obesity ([place]  Hypothyroidism   Hyperlipidemia associated with type 2 diabetes mellitus ([facility]  Essential hypertension    Other Visit Diagnoses    Annual physical exam    -  Primary   Needs flu shot       Relevant Orders   Flu Vaccine QUAD 36+ mos IM (Completed)     Updated Health Maintenance information Flu vaccine Reviewed recent lab results with patient Encouraged improvement to lifestyle with diet and exercise - Goal of weight loss     No orders of the defined types were placed in this encounter.    Follow up plan: Return in about 3 months (around 05/14/2019) for  3 month follow-up DM A1c ?new med / Weight.  [name], [facility] 02/11/2019, 11:02 AM

## 2019-02-13 LAB — HM DIABETES EYE EXAM

## 2019-02-13 NOTE — Assessment & Plan Note (Signed)
Elevated A1c to 8.9 With some hyperglycemia still reported on CBGs Without Hypoglycemia Complicated by DM retinopathy  Plan:  1. Can maximize dose Metofmrin  2. Encourage improved lifestyle - low carb, low sugar diet, reduce portion size, continue improving regular exercise 3. Check CBG, bring log to next visit for review  Advised next DM eye visit - follow up  If A1c not improved w/ aggressive lifestyle wt loss then will recommend /  Offer GLP1 medications

## 2019-03-05 ENCOUNTER — Encounter: Payer: Self-pay | Admitting: Urology

## 2019-03-06 MED ORDER — TADALAFIL 20 MG PO TABS: 20.0000 mg | ORAL_TABLET | Freq: Every day | ORAL | 6 refills | Status: DC | PRN

## 2019-03-06 NOTE — Telephone Encounter (Signed)
Patient want his medication sent to the [facility]

## 2019-03-07 ENCOUNTER — Other Ambulatory Visit: Payer: Self-pay | Admitting: Urology

## 2019-03-07 ENCOUNTER — Telehealth: Payer: Self-pay

## 2019-03-07 DIAGNOSIS — N5201 Erectile dysfunction due to arterial insufficiency: Secondary | ICD-10-CM

## 2019-03-07 MED ORDER — TADALAFIL 20 MG PO TABS: 20.0000 mg | ORAL_TABLET | Freq: Every day | ORAL | 6 refills | Status: DC | PRN

## 2019-03-07 NOTE — Telephone Encounter (Signed)
RX cancelled at [facility] and sent to Shane Frazier in order for pt to use [employer]RX coupon.

## 2019-03-25 ENCOUNTER — Encounter: Payer: Self-pay | Admitting: Family Medicine

## 2019-03-25 ENCOUNTER — Other Ambulatory Visit: Payer: Self-pay

## 2019-03-25 ENCOUNTER — Ambulatory Visit (INDEPENDENT_AMBULATORY_CARE_PROVIDER_SITE_OTHER): Payer: No Typology Code available for payment source | Admitting: Family Medicine

## 2019-03-25 DIAGNOSIS — E113293 Type 2 diabetes mellitus with mild nonproliferative diabetic retinopathy without macular edema, bilateral: Secondary | ICD-10-CM

## 2019-03-25 NOTE — Progress Notes (Signed)
Patient was called today for his virtual visit for diabetes follow-up. However it has only been 6 weeks since last visit for physical. He says he had no new issues, and was actually planning to wait until the 3 month visit to follow-up on the A1c and sugar. I advised him that if no new medical problems or updates today, we could cancel and re-schedule this virtual visit. He should follow up face to face in January 2021 for Diabetes A1c, foot exam, and medication management.  [name], DO [facility] 03/25/2019, 11:36 AM

## 2019-04-23 ENCOUNTER — Ambulatory Visit: Payer: No Typology Code available for payment source | Attending: Internal Medicine

## 2019-04-23 DIAGNOSIS — Z20822 Contact with and (suspected) exposure to covid-19: Secondary | ICD-10-CM

## 2019-04-24 LAB — NOVEL CORONAVIRUS, NAA: SARS-CoV-2, NAA: NOT DETECTED

## 2019-04-29 ENCOUNTER — Other Ambulatory Visit: Payer: Self-pay | Admitting: Family Medicine

## 2019-04-29 DIAGNOSIS — I10 Essential (primary) hypertension: Secondary | ICD-10-CM

## 2019-04-29 DIAGNOSIS — E039 Hypothyroidism, unspecified: Secondary | ICD-10-CM

## 2019-05-06 ENCOUNTER — Ambulatory Visit: Payer: No Typology Code available for payment source | Attending: Internal Medicine

## 2019-05-06 DIAGNOSIS — Z20822 Contact with and (suspected) exposure to covid-19: Secondary | ICD-10-CM

## 2019-05-07 LAB — NOVEL CORONAVIRUS, NAA: SARS-CoV-2, NAA: NOT DETECTED

## 2019-05-13 ENCOUNTER — Ambulatory Visit (INDEPENDENT_AMBULATORY_CARE_PROVIDER_SITE_OTHER): Payer: No Typology Code available for payment source | Admitting: Family Medicine

## 2019-05-13 ENCOUNTER — Other Ambulatory Visit: Payer: Self-pay

## 2019-05-13 ENCOUNTER — Encounter: Payer: Self-pay | Admitting: Family Medicine

## 2019-05-13 VITALS — BP 138/70 | HR 81 | Temp 97.7°F | Resp 16 | Ht 68.0 in | Wt 249.0 lb

## 2019-05-13 DIAGNOSIS — E113293 Type 2 diabetes mellitus with mild nonproliferative diabetic retinopathy without macular edema, bilateral: Secondary | ICD-10-CM | POA: Diagnosis not present

## 2019-05-13 DIAGNOSIS — I10 Essential (primary) hypertension: Secondary | ICD-10-CM

## 2019-05-13 LAB — POCT GLYCOSYLATED HEMOGLOBIN (HGB A1C): Hemoglobin A1C: 9.3 % — AB (ref 4.0–5.6)

## 2019-05-13 MED ORDER — OZEMPIC (0.25 OR 0.5 MG/DOSE) 2 MG/1.5ML ~~LOC~~ SOPN: 0.2500 mg | PEN_INJECTOR | SUBCUTANEOUS | 0 refills | Status: DC

## 2019-05-13 NOTE — Progress Notes (Signed)
Subjective:    Patient ID: Shane Frazier., male    DOB: [birth year], 54 y.o.   MRN: [id number]  Esai Thaut. is a 54 y.o. male presenting on 05/13/2019 for Diabetes   HPI   CHRONIC DM, Type 2: Prior A1c increased from 8 to 8.9, now due today A1c CBGs:Home readings reviewed, elevated readings Meds:Metformin500mg  BIDwith meal, did not increase. No other meds Reports good compliance. 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 Some elevated readings here in office at times. Current Meds -Lisinopril 20mg  daily Reports good compliance, took meds today. Tolerating well, w/o complaints. Denies CP, dyspnea, HA, edema, dizziness / lightheadedness   Depression screen [facility] 2/9 05/13/2019 03/25/2019 02/11/2019  Decreased Interest 0 0 0  Down, Depressed, Hopeless 0 0 0  PHQ - 2 Score 0 0 0    Social History   Tobacco Use  . Smoking status: Never Smoker  . Smokeless tobacco: Current User    Types: Chew  Substance Use Topics  . Alcohol use: Yes    Comment: occasionally. rarely  . Drug use: No    Review of Systems Per HPI unless specifically indicated above     Objective:    BP 138/70 (BP Location: Left Arm, Cuff Size: Normal)   Pulse 81   Temp 97.7 F (36.5 C) (Oral)   Resp 16   Ht 5\' 8"  (1.727 m)   Wt 249 lb (112.9 kg)   BMI 37.86 kg/m   Wt Readings from Last 3 Encounters:  05/13/19 249 lb (112.9 kg)  02/11/19 242 lb (109.8 kg)  01/07/19 239 lb (108.4 kg)    Physical Exam Vitals and nursing note reviewed.  Constitutional:      General: He is not in acute distress.    Appearance: He is  well-developed. He is not diaphoretic.     Comments: Well-appearing, comfortable, cooperative  HENT:     Head: Normocephalic and atraumatic.  Eyes:     General:        Right eye: No discharge.        Left eye: No discharge.     Conjunctiva/sclera: Conjunctivae normal.  Cardiovascular:     Rate and Rhythm: Normal rate.  Pulmonary:     Effort: Pulmonary effort is normal.  Skin:    General: Skin is warm and dry.     Findings: No erythema or rash.  Neurological:     Mental Status: He is alert and oriented to person, place, and time.  Psychiatric:        Behavior: Behavior normal.     Comments: Well groomed, good eye contact, normal speech and thoughts      Diabetic Foot Exam - Simple   Simple Foot Form Diabetic Foot exam was performed with the following findings: Yes 05/13/2019  9:48 AM  Visual Inspection No deformities, no ulcerations, no other skin breakdown bilaterally: Yes Sensation Testing Intact to touch and monofilament testing bilaterally: Yes Pulse Check Posterior Tibialis and Dorsalis pulse intact bilaterally: Yes Comments      Recent Labs    10/01/18 0813 02/04/19 0826 05/13/19 0942  HGBA1C 8.1* 8.9* 9.3*     Results  for orders placed or performed in visit on 05/13/19  POCT HgB A1C  Result Value Ref Range   Hemoglobin A1C 9.3 (A) 4.0 - 5.6 %      Assessment & Plan:   Problem List Items Addressed This Visit    Type 2 diabetes mellitus with diabetic retinopathy ([place]) - Primary    Elevated A1c to 9.3 from 8 range prior, wt gain, non adherent lifestyle With some hyperglycemia still reported on CBGs Without Hypoglycemia Complicated by DM retinopathy  Plan:  1. START GLP1 - ozempic sample given today 0.25mg  weekly x 4 week then up to 0.5mg  weekly x 2 weeks - call us for request new rx order Ozempic 0.5mg  weekly inj, review benefit risk side effect, goal to titrate to 1mg  in future - CONTINUE Metformin 500 BID for now, can adjust dose inc if need as  well 2. Encourage improved lifestyle - low carb, low sugar diet, reduce portion size, continue improving regular exercise 3. Check CBG, bring log to next visit for review 4. DM Foot exam - Request DM Eye exam from Dr Ellin Mayhew 01/2019      Relevant Medications   OZEMPIC, 0.25 OR 0.5 MG/DOSE, 2 MG/1.5ML SOPN   Other Relevant Orders   POCT HgB A1C (Completed)   Morbid obesity ([place])    Morbid obesity with BMI >38 Comorbid conditions type 2 diabetes, HLD, HTN      Relevant Medications   OZEMPIC, 0.25 OR 0.5 MG/DOSE, 2 MG/1.5ML SOPN   Essential hypertension    Mild elevated BP, improve on recheck - Home BP readings none     Plan:  1. Continue current BP regimen - refilled Diltiazem 420mg  daily, also continue other meds Lisinopril 20mg  and HCTZ 25mg  2. Encourage improved lifestyle - low sodium diet, regular exercise         Meds ordered this encounter  Medications  . OZEMPIC, 0.25 OR 0.5 MG/DOSE, 2 MG/1.5ML SOPN    Sig: Inject 0.25 mg into the skin once a week. For first 4 weeks. Then increase dose to 0.5mg  weekly    Dispense:  1 pen    Refill:  0      Follow up plan: Return in about 3 months (around 08/11/2019) for 3 month DM A1c med adjust.   [name], DO [facility] 05/13/2019, 9:41 AM

## 2019-05-13 NOTE — Assessment & Plan Note (Signed)
Mild elevated BP, improve on recheck - Home BP readings none     Plan:  1. Continue current BP regimen - refilled Diltiazem 420mg  daily, also continue other meds Lisinopril 20mg  and HCTZ 25mg  2. Encourage improved lifestyle - low sodium diet, regular exercise

## 2019-05-13 NOTE — Assessment & Plan Note (Signed)
Morbid obesity with BMI >38 Comorbid conditions type 2 diabetes, HLD, HTN

## 2019-05-13 NOTE — Assessment & Plan Note (Signed)
Elevated A1c to 9.3 from 8 range prior, wt gain, non adherent lifestyle With some hyperglycemia still reported on CBGs Without Hypoglycemia Complicated by DM retinopathy  Plan:  1. START GLP1 - ozempic sample given today 0.25mg  weekly x 4 week then up to 0.5mg  weekly x 2 weeks - call us for request new rx order Ozempic 0.5mg  weekly inj, review benefit risk side effect, goal to titrate to 1mg  in future - CONTINUE Metformin 500 BID for now, can adjust dose inc if need as well 2. Encourage improved lifestyle - low carb, low sugar diet, reduce portion size, continue improving regular exercise 3. Check CBG, bring log to next visit for review 4. DM Foot exam - Request DM Eye exam from Dr Ellin Mayhew 01/2019

## 2019-05-13 NOTE — Patient Instructions (Addendum)
Thank you for coming to the office today.  Call insurance find cost and coverage of the following - Check if ozempic is covered, otherwise if not, we need to switch to Trulicity or Bydureon BCise  1. Ozempic (Semaglutide injection) - start 0.25mg  weekly for 4 weeks then increase to 0.5mg  weekly - This one has best benefit of weight loss and reducing Cardiovascular events  Sample today for 6 weeks, let me know if ready for NEW rx in 4-5 weeks before you run out.  Recent Labs    10/01/18 0813 02/04/19 0826 05/13/19 0942  HGBA1C 8.1* 8.9* 9.3*     Please schedule a Follow-up Appointment to: Return in about 3 months (around 08/11/2019) for 3 month DM A1c med adjust.  If you have any other questions or concerns, please feel free to call the office or send a message through [place]. You may also schedule an earlier appointment if necessary.  Additionally, you may be receiving a survey about your experience at our office within a few days to 1 week by e-mail or mail. We value your feedback.  [name], DO [place]

## 2019-05-27 ENCOUNTER — Other Ambulatory Visit: Payer: Self-pay | Admitting: Family Medicine

## 2019-05-27 DIAGNOSIS — E113293 Type 2 diabetes mellitus with mild nonproliferative diabetic retinopathy without macular edema, bilateral: Secondary | ICD-10-CM

## 2019-06-24 ENCOUNTER — Other Ambulatory Visit: Payer: Self-pay

## 2019-06-24 DIAGNOSIS — E113293 Type 2 diabetes mellitus with mild nonproliferative diabetic retinopathy without macular edema, bilateral: Secondary | ICD-10-CM

## 2019-06-25 MED ORDER — OZEMPIC (0.25 OR 0.5 MG/DOSE) 2 MG/1.5ML ~~LOC~~ SOPN: 0.2500 mg | PEN_INJECTOR | SUBCUTANEOUS | 1 refills | Status: DC

## 2019-09-10 ENCOUNTER — Encounter: Payer: Self-pay | Admitting: Family Medicine

## 2019-09-10 ENCOUNTER — Ambulatory Visit (INDEPENDENT_AMBULATORY_CARE_PROVIDER_SITE_OTHER): Payer: No Typology Code available for payment source | Admitting: Family Medicine

## 2019-09-10 ENCOUNTER — Other Ambulatory Visit: Payer: Self-pay

## 2019-09-10 ENCOUNTER — Other Ambulatory Visit: Payer: Self-pay | Admitting: Family Medicine

## 2019-09-10 VITALS — BP 142/90 | HR 84 | Temp 97.7°F | Resp 16 | Ht 68.0 in | Wt 239.6 lb

## 2019-09-10 DIAGNOSIS — I10 Essential (primary) hypertension: Secondary | ICD-10-CM

## 2019-09-10 DIAGNOSIS — Z9989 Dependence on other enabling machines and devices: Secondary | ICD-10-CM | POA: Insufficient documentation

## 2019-09-10 DIAGNOSIS — E113293 Type 2 diabetes mellitus with mild nonproliferative diabetic retinopathy without macular edema, bilateral: Secondary | ICD-10-CM | POA: Diagnosis not present

## 2019-09-10 DIAGNOSIS — E785 Hyperlipidemia, unspecified: Secondary | ICD-10-CM

## 2019-09-10 DIAGNOSIS — R351 Nocturia: Secondary | ICD-10-CM

## 2019-09-10 DIAGNOSIS — E039 Hypothyroidism, unspecified: Secondary | ICD-10-CM

## 2019-09-10 DIAGNOSIS — Z0000 Encounter for general adult medical examination without abnormal findings: Secondary | ICD-10-CM

## 2019-09-10 DIAGNOSIS — G4733 Obstructive sleep apnea (adult) (pediatric): Secondary | ICD-10-CM | POA: Insufficient documentation

## 2019-09-10 DIAGNOSIS — E1169 Type 2 diabetes mellitus with other specified complication: Secondary | ICD-10-CM

## 2019-09-10 DIAGNOSIS — G8929 Other chronic pain: Secondary | ICD-10-CM

## 2019-09-10 DIAGNOSIS — R29818 Other symptoms and signs involving the nervous system: Secondary | ICD-10-CM | POA: Diagnosis not present

## 2019-09-10 DIAGNOSIS — M25511 Pain in right shoulder: Secondary | ICD-10-CM | POA: Diagnosis not present

## 2019-09-10 LAB — POCT GLYCOSYLATED HEMOGLOBIN (HGB A1C): Hemoglobin A1C: 8 % — AB (ref 4.0–5.6)

## 2019-09-10 MED ORDER — OZEMPIC (1 MG/DOSE) 4 MG/3ML ~~LOC~~ SOPN: 1.0000 mg | PEN_INJECTOR | SUBCUTANEOUS | 1 refills | Status: DC

## 2019-09-10 MED ORDER — HYDROCHLOROTHIAZIDE 25 MG PO TABS: 25.0000 mg | ORAL_TABLET | Freq: Every day | ORAL | 3 refills | Status: DC

## 2019-09-10 NOTE — Assessment & Plan Note (Signed)
Stable currently Intact range of motion Exam not supportive of rotator cuff tendinopathy or injury Suspected some overuse component may have episodic bursitis or tendonitis of R shoulder Future consider x-ray if indicated

## 2019-09-10 NOTE — Assessment & Plan Note (Signed)
Persistent clinical concern for suspected obstructive sleep apnea given reported symptoms with witnessed apnea, snoring and sleep disturbance, excessive sleepiness. - Screening: ESS score 16 / STOP-Bang Score 7 high risk - Neck Circumference: 18" - Co-morbidities: HTN  Plan: 1. Discussion on initial diagnosis and testing for OSA, risk factors, management, complications 2. Agree to proceed with sleep study testing based on clinical concerns - referral to [facility] for initial PSG and further management.

## 2019-09-10 NOTE — Assessment & Plan Note (Signed)
Significantly improved A1c from 9.3 down to 8.0 on GLP1 agent and lifestyle Wt down 10 lbs With some hyperglycemia still reported on CBGs Without Hypoglycemia Complicated by DM retinopathy  Plan:  1. INCREASE GLP1 Ozempic from 0.5mg  weekly inj up to 1.0mg  weekly inj - #84 day supply ordered - CONTINUE Metformin 500 BID for now, can adjust dose inc if need as well 2. Encourage improved lifestyle - low carb, low sugar diet, reduce portion size, continue improving regular exercise 3. Check CBG, bring log to next visit for review - Request DM Eye exam from Dr Ellin Mayhew 01/2019 again

## 2019-09-10 NOTE — Patient Instructions (Addendum)
Thank you for coming to the office today.  Increase Ozempic to 1mg  - new pen and dose sent to [facility]. It should be a 3 month supply or 1 pen per month, 3 pens, dose is 1.0mg  injection weekly - can finish the old pen at 0.5 and then start new one when ready.  [facility] - will fax them order for sleep study. Stay tuned. They can offer possibly a home sleep study test or they can do both tests at the sleep center. Stay tuned.  Tingling in fingertips likely from elevated blood sugar.  Recommend call Dr Du[facility] for follow-up on ED   Please schedule a Follow-up Appointment to: Return in about 5 months (around 02/10/2020) for Annual Physical.  If you have any other questions or concerns, please feel free to call the office or send a message through [place]. You may also schedule an earlier appointment if necessary.  Additionally, you may be receiving a survey about your experience at our office within a few days to 1 week by e-mail or mail. We value your feedback.  [name], DO [place]

## 2019-09-10 NOTE — Assessment & Plan Note (Addendum)
Mild elevated BP, improve on recheck. Still above goal - Home BP readings none     Plan:  1. Continue current BP regimen - Diltiazem 420mg  daily, Lisinopril 20mg  - Seems to be off HCTZ 25mg  daily, no longer on list - will determine if still taking otherwise may be due for re order - on further discussion we cannot determine why this med was stopped, will agree to re order HCTZ 25mg  daily for now 2. Encourage improved lifestyle - low sodium diet, regular exercise

## 2019-09-10 NOTE — Progress Notes (Addendum)
Subjective:    Patient ID: Shane Frazier., male    DOB: [birth year], 54 y.o.   MRN: [id number]  Shane Frazier. is a 54 y.o. male presenting on 09/10/2019 for Diabetes   HPI   CHRONIC DM, Type 2: History of elevated A1c up to 9.3 previously 04/2019, at that visit he was initiated on Ozempic injection therapy Interval update he has done well with dose increase on medicine. CBGs:Home readings improved Meds:Ozempic 0.5mg  weekly injection, Metformin500mg  BIDwith meal Reports good compliance. Tolerating well w/o side-effects Currently on ACEi Lifestyle: - Diet (trying to improvediabetic diet) - Exercise - limited with work driving long hours Reportedly had DM Eye at [facility]- will request record, he said no changes but has history of DM Retinopathy already Admits weight loss 10 lbs in 4 months Denies hypoglycemia  CHRONIC HTN: Reportshome readings good. Some elevated readings here in office at times. Current Meds -Lisinopril 20mg  daily, Diltiazem LA 420mg  daily - off HCTZ 25mg  Reports good compliance, took meds today. Tolerating well, w/o complaints. Denies CP, dyspnea, HA, edema, dizziness / lightheadedness  R Shoulder, chronic pain History of Left shoulder surgery rotator cuff fix Now has some R shoulder issues with pain episodic. 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 He lays on it often. Admits some tingling in fingertips   Suspected Sleep Apnea Concern for patient has had witnessed sleep apnea events overnight, stopped breathing. Admits snoring overnight loudly. Has some muscle jerk and jumps in sleep. Admits excessive daytime sleepiness.  Epworth Sleepiness Scale Total Score: 16 Sitting and reading - 3 Watching TV - 3 Sitting inactive in a public place - 1 As a passenger in a car for an hour without a break - 3 Lying down to rest in the afternoon when circumstances permit - 3 Sitting and talking to someone -  1 Sitting quietly after a lunch without alcohol - 2 In a car, while stopped for a few minutes in traffic - 0  STOP-Bang OSA scoring Snoring yes   Tiredness yes   Observed apneas yes   Pressure HTN yes   BMI > 35 kg/m2 yes   Age > 61  yes   Neck (male >17 in; Male >16 in)  yes 25"  Gender male yes   OSA risk low (0-2)  OSA risk intermediate (3-4)  OSA risk high (5+)  Total: 8 high risk     Health Maintenance: Not updated on COVID19 vaccine, he declines at this time.  Depression screen [facility] 2/9 09/10/2019 05/13/2019 03/25/2019  Decreased Interest 0 0 0  Down, Depressed, Hopeless 0 0 0  PHQ - 2 Score 0 0 0    Social History   Tobacco Use  . Smoking status: Never Smoker  . Smokeless tobacco: Current User    Types: Chew  Substance Use Topics  . Alcohol use: Yes    Comment: occasionally. rarely  . Drug use: No    Review of Systems Per HPI unless specifically indicated above     Objective:    BP (!) 142/90 (BP Location: Left Arm, Cuff Size: Normal)   Pulse 84   Temp 97.7 F (36.5 C) (Temporal)   Resp 16   Ht 5\' 8"  (1.727 m)   Wt 239 lb 9.6 oz (108.7 kg)   BMI 36.43 kg/m   Wt Readings from Last 3 Encounters:  09/10/19 239 lb 9.6 oz (108.7 kg)  05/13/19 249 lb (112.9 kg)  02/11/19 242 lb (109.8 kg)    Physical Exam Vitals and nursing note reviewed.  Constitutional:      General: He is not in acute distress.    Appearance: He is well-developed. He is not diaphoretic.     Comments: Well-appearing, comfortable, cooperative  HENT:     Head: Normocephalic and atraumatic.  Eyes:     General:        Right eye: No discharge.        Left eye: No discharge.     Conjunctiva/sclera: Conjunctivae normal.  Cardiovascular:     Rate and Rhythm: Normal rate.  Pulmonary:     Effort: Pulmonary effort is normal.  Musculoskeletal:     Comments: Right Shoulder Inspection: Normal appearance bilateral symmetrical Palpation: Non-tender to palpation over anterior, lateral,  or posterior shoulder  ROM: Full intact active ROM forward flexion, abduction, internal / external rotation, symmetrical Special Testing: Rotator cuff testing negative for weakness with supraspinatus full can and empty can test, Negative AC impingement negative for pain Strength: Normal strength 5/5 flex/ext, ext rot / int rot, grip, rotator cuff str testing. Neurovascular: Distally intact pulses, sensation to light touch   Skin:    General: Skin is warm and dry.     Findings: No erythema or rash.  Neurological:     Mental Status: He is alert and oriented to person, place, and time.  Psychiatric:        Behavior: Behavior normal.     Comments: Well groomed, good eye contact, normal speech and thoughts      Recent Labs    02/04/19 0826 05/13/19 0942 09/10/19 0908  HGBA1C 8.9* 9.3* 8.0*     Results for orders placed or performed in visit on 09/10/19  POCT glycosylated hemoglobin (Hb A1C)  Result Value Ref Range   Hemoglobin A1C 8.0 (A) 4.0 - 5.6 %      Assessment & Plan:   Problem List Items Addressed This Visit    Type 2 diabetes mellitus with diabetic retinopathy ([place]) - Primary    Significantly improved A1c from 9.3 down to 8.0 on GLP1 agent and lifestyle Wt down 10 lbs With some hyperglycemia still reported on CBGs Without Hypoglycemia Complicated by DM retinopathy  Plan:  1. INCREASE GLP1 Ozempic from 0.5mg  weekly inj up to 1.0mg  weekly inj - #84 day supply ordered - CONTINUE Metformin 500 BID for now, can adjust dose inc if need as well 2. Encourage improved lifestyle - low carb, low sugar diet, reduce portion size, continue improving regular exercise 3. Check CBG, bring log to next visit for review - Request DM Eye exam from Dr Ellin Mayhew 01/2019 again      Relevant Medications   OZEMPIC, 1 MG/DOSE, 4 MG/3ML SOPN   Other Relevant Orders   POCT glycosylated hemoglobin (Hb A1C) (Completed)   Suspected sleep apnea    Persistent clinical concern for suspected  obstructive sleep apnea given reported symptoms with witnessed apnea, snoring and sleep disturbance, excessive sleepiness. - Screening: ESS score 16 / STOP-Bang Score 7 high risk - Neck Circumference: 18" - Co-morbidities: HTN  Plan: 1. Discussion on initial diagnosis and testing for OSA, risk factors, management, complications 2. Agree to proceed with sleep study testing based on clinical concerns - referral to [facility] for initial PSG and further management.      Essential hypertension    Mild elevated BP, improve on recheck. Still above goal - Home BP readings none     Plan:  1. Continue current BP regimen - Diltiazem 420mg  daily, Lisinopril 20mg  - Seems to be off HCTZ 25mg  daily, no longer on list - will determine if still taking otherwise may be due for re order - on further discussion we cannot determine why this med was stopped, will agree to re order HCTZ 25mg  daily for now 2. Encourage improved lifestyle - low sodium diet, regular exercise      Relevant Medications   hydrochlorothiazide (HYDRODIURIL) 25 MG tablet   Chronic right shoulder pain    Stable currently Intact range of motion Exam not supportive of rotator cuff tendinopathy or injury Suspected some overuse component may have episodic bursitis or tendonitis of R shoulder Future consider x-ray if indicated            Meds ordered this encounter  Medications  . OZEMPIC, 1 MG/DOSE, 4 MG/3ML SOPN    Sig: Inject 1 mg into the skin once a week.    Dispense:  9 mL    Refill:  1    Dose increase from 0.5 up to 1mg   . hydrochlorothiazide (HYDRODIURIL) 25 MG tablet    Sig: Take 1 tablet (25 mg total) by mouth daily.    Dispense:  90 tablet    Refill:  3      Follow up plan: Return in about 5 months (around 02/10/2020) for Annual Physical.  Future labs ordered for 01/2020  DOT Physical Tomorrow 5/26   [name], [facility] 09/10/2019, 9:06 AM

## 2019-09-11 ENCOUNTER — Encounter: Payer: No Typology Code available for payment source | Admitting: Family Medicine

## 2019-09-11 ENCOUNTER — Encounter: Payer: Self-pay | Admitting: Family Medicine

## 2019-09-11 ENCOUNTER — Ambulatory Visit (INDEPENDENT_AMBULATORY_CARE_PROVIDER_SITE_OTHER): Payer: No Typology Code available for payment source | Admitting: Family Medicine

## 2019-09-11 ENCOUNTER — Other Ambulatory Visit: Payer: Self-pay

## 2019-09-11 VITALS — BP 128/76 | HR 77 | Temp 97.5°F | Ht 68.0 in | Wt 240.8 lb

## 2019-09-11 DIAGNOSIS — Z024 Encounter for examination for driving license: Secondary | ICD-10-CM

## 2019-09-11 LAB — POCT URINALYSIS DIPSTICK
Bilirubin, UA: NEGATIVE
Blood, UA: NEGATIVE
Glucose, UA: POSITIVE — AB
Ketones, UA: NEGATIVE
Leukocytes, UA: NEGATIVE
Nitrite, UA: NEGATIVE
Protein, UA: NEGATIVE
Spec Grav, UA: 1.02 (ref 1.010–1.025)
Urobilinogen, UA: 0.2 E.U./dL
pH, UA: 5 (ref 5.0–8.0)

## 2019-09-11 NOTE — Progress Notes (Signed)
Subjective:    Patient ID: Shane Frazier., male    DOB: [birth year], 54 y.o.   MRN: [id number]  Shane Frazier. is a 54 y.o. male presenting on 09/11/2019 for Employment Physical (DOT Physical)   HPI  Mr. Saladino presents to clinic for DOT PE.  Depression screen [facility] 2/9 09/10/2019 05/13/2019 03/25/2019  Decreased Interest 0 0 0  Down, Depressed, Hopeless 0 0 0  PHQ - 2 Score 0 0 0    Social History   Tobacco Use  . Smoking status: Never Smoker  . Smokeless tobacco: Current User    Types: Chew  Substance Use Topics  . Alcohol use: Yes    Comment: occasionally. rarely  . Drug use: No    Review of Systems  Constitutional: Negative.   HENT: Negative.   Eyes: Negative.   Respiratory: Negative.   Cardiovascular: Negative.   Gastrointestinal: Negative.   Endocrine: Negative.   Genitourinary: Negative.   Musculoskeletal: Negative.   Skin: Negative.   Allergic/Immunologic: Negative.   Neurological: Negative.   Hematological: Negative.   Psychiatric/Behavioral: Negative.    Per HPI unless specifically indicated above     Objective:    BP 128/76 (BP Location: Right Arm, Patient Position: Sitting, Cuff Size: Large)   Pulse 77   Temp (!) 97.5 F (36.4 C) (Temporal)   Ht 5\' 8"  (1.727 m)   Wt 240 lb 12.8 oz (109.2 kg)   BMI 36.61 kg/m   Wt Readings from Last 3 Encounters:  09/11/19 240 lb 12.8 oz (109.2 kg)  09/10/19 239 lb 9.6 oz (108.7 kg)  05/13/19 249 lb (112.9 kg)    Physical Exam Vitals reviewed.  Constitutional:      General: He is not in acute distress.    Appearance: Normal appearance. He is well-developed and well-groomed. He is obese. He is not ill-appearing or toxic-appearing.  HENT:     Head: Normocephalic.     Right Ear: Tympanic membrane, ear canal and external ear normal. There is no impacted cerumen.     Left Ear: Tympanic membrane, ear canal and external ear normal. There is no impacted cerumen.     Nose: Nose normal. No congestion or rhinorrhea.     Mouth/Throat:     Mouth: Mucous membranes are moist.     Pharynx: Oropharynx is clear. No oropharyngeal exudate or posterior oropharyngeal erythema.  Eyes:     General: Lids are normal. Vision grossly intact. No scleral icterus.       Right eye: No discharge.        Left eye: No discharge.     Extraocular Movements: Extraocular movements intact.     Conjunctiva/sclera: Conjunctivae normal.     Pupils: Pupils are equal, round, and reactive to light.  Cardiovascular:     Rate and Rhythm: Normal rate and regular rhythm.     Pulses: Normal pulses.          Dorsalis pedis pulses are 2+ on the right side and 2+ on the left side.       Posterior tibial pulses are 2+ on the right side and 2+ on the left side.     Heart sounds: Normal heart sounds. No murmur. No friction rub. No gallop.   Pulmonary:     Effort: Pulmonary effort is normal. No respiratory distress.     Breath sounds: Normal breath sounds. No wheezing, rhonchi or rales.  Abdominal:     General: Abdomen is flat. Bowel sounds are normal.  There is no distension.     Palpations: Abdomen is soft. There is no hepatomegaly, splenomegaly or mass.     Tenderness: There is no abdominal tenderness. There is no right CVA tenderness, left CVA tenderness, guarding or rebound.     Hernia: No hernia is present.  Musculoskeletal:        General: Normal range of motion.     Cervical back: Normal range of motion and neck supple. No rigidity or tenderness.     Right lower leg: No edema.     Left lower leg: No edema.  Feet:     Right foot:     Skin integrity: Skin integrity normal.     Left foot:     Skin integrity: Skin integrity normal.  Lymphadenopathy:     Cervical: No cervical adenopathy.  Skin:    General: Skin is warm and dry.     Capillary Refill: Capillary refill takes less than 2 seconds.  Neurological:     General: No focal deficit present.     Mental Status: He is alert and oriented to person, place, and time.     Cranial  Nerves: Cranial nerves are intact. No cranial nerve deficit.     Sensory: Sensation is intact. No sensory deficit.     Motor: Motor function is intact. No weakness.     Coordination: Coordination is intact. Coordination normal.     Gait: Gait is intact. Gait normal.     Deep Tendon Reflexes: Reflexes are normal and symmetric. Reflexes normal.  Psychiatric:        Attention and Perception: Attention and perception normal.        Mood and Affect: Mood and affect normal.        Speech: Speech normal.        Behavior: Behavior normal. Behavior is cooperative.        Thought Content: Thought content normal.        Cognition and Memory: Cognition and memory normal.        Judgment: Judgment normal.    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      Relevant Orders   POCT Urinalysis Dipstick (Completed)      No orders of the defined types were placed in this encounter.     Follow up plan: Return in about 1 year (around 09/10/2020) for DOT PE.   Harlin Rain, [facility] 09/11/2019, 11:14 AM

## 2019-09-11 NOTE — Assessment & Plan Note (Signed)
DOT PE, card given for 1 year, expires 09/10/2020 with seizure exemption from [facility].

## 2019-09-12 DIAGNOSIS — R29818 Other symptoms and signs involving the nervous system: Secondary | ICD-10-CM

## 2019-09-13 ENCOUNTER — Encounter: Payer: Self-pay | Admitting: Family Medicine

## 2019-09-17 ENCOUNTER — Other Ambulatory Visit: Payer: Self-pay

## 2019-09-17 ENCOUNTER — Ambulatory Visit (INDEPENDENT_AMBULATORY_CARE_PROVIDER_SITE_OTHER): Payer: No Typology Code available for payment source | Admitting: Family Medicine

## 2019-09-17 ENCOUNTER — Encounter: Payer: Self-pay | Admitting: Family Medicine

## 2019-09-17 VITALS — BP 154/77 | HR 87 | Temp 97.5°F | Resp 16 | Ht 68.0 in | Wt 241.6 lb

## 2019-09-17 DIAGNOSIS — S83422A Sprain of lateral collateral ligament of left knee, initial encounter: Secondary | ICD-10-CM | POA: Diagnosis not present

## 2019-09-17 NOTE — Patient Instructions (Addendum)
Thank you for coming to the office today.  Switched orders now to - Humphreys most likely have a Left knee lateral sprain - this is an injury to the ligaments supporting the knee, it can be a minor injury or small tear, rarely it can turn out to be a larger tear to the ligament if it does not improve, we cannot rule this out.   Exam today is not suggestive of meniscus tear or injury which is good news  No X-ray today we can reconsider in future  - I recommend a Knee Brace to support your knee and limit motion of the knee to help it heal, avoid excess weight bearing and repetitive activities on it - Not while sleeping  Recommend to start taking Tylenol Extra Strength 500mg  tabs - take 1 to 2 tabs per dose (max 1000mg ) every 6-8 hours for pain (take regularly, don't skip a dose for next 7 days), max 24 hour daily dose is 6 tablets or 3000mg . In the future you can repeat the same everyday Tylenol course for 1-2 weeks at a time.   Use RICE therapy: - R - Rest / relative rest with activity modification avoid overuse and frequent bending or pressure on bent knee - I - Ice packs (make sure you use a towel or sock / something to protect skin) - C - Compression with ACE wrap or immobilizer apply pressure and reduce swelling allowing more support - E - Elevation - if significant swelling, lift leg above heart level (toes above your nose) to help reduce swelling, most helpful at night after day of being on your feet   Orthopedics should also have an Urgent Care Walk In Availability for after hours.   Please schedule a Follow-up Appointment to: Return in about 4 weeks (around 10/15/2019), or if symptoms worsen or fail to improve, for left knee sprain.  If you have any other questions or concerns, please feel free to call the office or send a message through [facility]. You may also schedule an earlier appointment if necessary.  Additionally, you may be receiving a survey about your experience  at our office within a few days to 1 week by e-mail or mail. We value your feedback.  [name], DO [place]

## 2019-09-17 NOTE — Progress Notes (Signed)
Subjective:    Patient ID: Shane Frazier., male    DOB: 1966-01-04, 54 y.o.   MRN: [id number]  Lenn Srinivasan. is a 54 y.o. male presenting on 09/17/2019 for Leg Pain (left side--as per patient twisted the leg while mowing the loan 2 weeks ago but has pain now)  Patient presents for a same day appointment.  HPI   Left Knee, Pain Reports new onset injury 2 weeks ago, mowing lawn and stepped into a hole and twisted Left knee, he had some pain initially, no initial swelling or bruising. It improved. Now over past few days Hard to say if it is improving or worsening. Left knee does not cause pain at rest. He can walk on it and not feeling unstable. However if flexing and bending Left knee he has moderate to severe sharp pain in knee only with bending. He tried knee brace at times for Left knee, worse when slept in it last night. If he walks with it helps support his knee. - Admits some mild swelling recently - not taking any medicine for pain - no prior L knee surgery Denies any bruising, other joint pain, numbness tingling, weakness   Depression screen [facility] 2/9 09/10/2019 05/13/2019 03/25/2019  Decreased Interest 0 0 0  Down, Depressed, Hopeless 0 0 0  PHQ - 2 Score 0 0 0    Social History   Tobacco Use  . Smoking status: Never Smoker  . Smokeless tobacco: Current User    Types: Chew  Substance Use Topics  . Alcohol use: Yes    Comment: occasionally. rarely  . Drug use: No    Review of Systems Per HPI unless specifically indicated above     Objective:    BP (!) 154/77   Pulse 87   Temp (!) 97.5 F (36.4 C) (Temporal)   Resp 16   Ht 5\' 8"  (1.727 m)   Wt 241 lb 9.6 oz (109.6 kg)   SpO2 99%   BMI 36.74 kg/m   Wt Readings from Last 3 Encounters:  09/17/19 241 lb 9.6 oz (109.6 kg)  09/11/19 240 lb 12.8 oz (109.2 kg)  09/10/19 239 lb 9.6 oz (108.7 kg)    Physical Exam Vitals and nursing note reviewed.  Constitutional:      General: He is not in acute distress.     Appearance: He is well-developed. He is not diaphoretic.     Comments: Well-appearing, comfortable, cooperative  HENT:     Head: Normocephalic and atraumatic.  Eyes:     General:        Right eye: No discharge.        Left eye: No discharge.     Conjunctiva/sclera: Conjunctivae normal.  Cardiovascular:     Rate and Rhythm: Normal rate.  Pulmonary:     Effort: Pulmonary effort is normal.  Musculoskeletal:     Comments: Left Knee Inspection: Normal appearance and symmetrical. No ecchymosis or effusion. Palpation: Mild tender left lateral knee superior to joint line. Mild to moderate fine crepitus bilateral. ROM: Full active ROM bilaterally Special Testing: Lachman / Valgus/Varus tests negative with intact ligaments (ACL, MCL, LCL). McMurray negative without meniscus symptoms. Standing Thessaly intact without pain or instability. Pain only provoked with weight bearing significant knee flexion on L Strength: 5/5 intact knee flex/ext, ankle dorsi/plantarflex Neurovascular: distally intact sensation light touch and pulses   Skin:    General: Skin is warm and dry.     Findings: No erythema or  rash.  Neurological:     Mental Status: He is alert and oriented to person, place, and time.  Psychiatric:        Behavior: Behavior normal.     Comments: Well groomed, good eye contact, normal speech and thoughts    Results for orders placed or performed in visit on 09/13/19  HM DIABETES EYE EXAM  Result Value Ref Range   HM Diabetic Eye Exam Retinopathy (A) No Retinopathy      Assessment & Plan:   Problem List Items Addressed This Visit    None    Visit Diagnoses    Sprain of lateral collateral ligament of left knee, initial encounter    -  Primary      Acute L lateral knee pain without bruising swelling Following acute injury step into hole with twisting motion without fall or other impact or trauma Limited improvement in 2 weeks Known OA/DJD crepitus Some concern for meniscus  given localized area of discomfort with knee flexion but has good stability on exam today - Able to bear weight, no knee instability and no mechanical locking - No prior history of knee surgery, arthroscopy - Inadequate conservative therapy   Plan: Today reassurance will proceed with conservative care, may use NSAID, Tylenol, muscle rub as needed, Knee brace PRN, RICE therapy, relative rest allow to heal from likely sprain.  Hold imaging unlikely fracture based on mechanism  Follow-up 2 - 4 weeks, if still worsening, consider PT vs Ortho vs X-ray   No orders of the defined types were placed in this encounter.     Follow up plan: Return in about 4 weeks (around 10/15/2019), or if symptoms worsen or fail to improve, for left knee sprain.  [name], [facility] 09/17/2019, 3:09 PM

## 2019-10-07 ENCOUNTER — Ambulatory Visit: Payer: No Typology Code available for payment source | Admitting: Family Medicine

## 2019-10-08 ENCOUNTER — Encounter: Payer: Self-pay | Admitting: Family Medicine

## 2019-10-08 ENCOUNTER — Other Ambulatory Visit: Payer: Self-pay

## 2019-10-08 ENCOUNTER — Ambulatory Visit (INDEPENDENT_AMBULATORY_CARE_PROVIDER_SITE_OTHER): Payer: No Typology Code available for payment source | Admitting: Family Medicine

## 2019-10-08 VITALS — BP 108/67 | HR 89 | Temp 97.9°F | Resp 16 | Ht 68.0 in | Wt 243.0 lb

## 2019-10-08 DIAGNOSIS — M1712 Unilateral primary osteoarthritis, left knee: Secondary | ICD-10-CM

## 2019-10-08 DIAGNOSIS — M79662 Pain in left lower leg: Secondary | ICD-10-CM

## 2019-10-08 DIAGNOSIS — M25562 Pain in left knee: Secondary | ICD-10-CM

## 2019-10-08 DIAGNOSIS — G8929 Other chronic pain: Secondary | ICD-10-CM

## 2019-10-08 DIAGNOSIS — M7989 Other specified soft tissue disorders: Secondary | ICD-10-CM | POA: Diagnosis not present

## 2019-10-08 DIAGNOSIS — S83422D Sprain of lateral collateral ligament of left knee, subsequent encounter: Secondary | ICD-10-CM

## 2019-10-08 MED ORDER — DICLOFENAC SODIUM 1 % EX GEL: 2.0000 g | Freq: Three times a day (TID) | CUTANEOUS | 2 refills | Status: DC | PRN

## 2019-10-08 NOTE — Progress Notes (Signed)
Subjective:    Patient ID: Shane Picket., male    DOB: 1965-12-05, 54 y.o.   MRN: [id number]  Shane Rahm. is a 54 y.o. male presenting on 10/08/2019 for Leg Swelling (Left, painful onset week)   HPI   Left Knee, Pain and swelling of LLE Possible LCL sprain - Last visit with me 09/17/19, for initial visit for same problem Left knee pain and sprain, treated with conservative care and brace at that time, see prior notes for background information. 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 Has pain in calf with swelling - No personal or fam history of DVT - Brace helps knee pain but cant always wear it - no prior L knee surgery or procedure Denies any bruising, other joint pain, numbness tingling, weakness, warmth redness  Depression screen [facility] 2/9 09/10/2019 05/13/2019 03/25/2019  Decreased Interest 0 0 0  Down, Depressed, Hopeless 0 0 0  PHQ - 2 Score 0 0 0    Social History   Tobacco Use  . Smoking status: Never Smoker  . Smokeless tobacco: Current User    Types: Chew  Vaping Use  . Vaping Use: Never used  Substance Use Topics  . Alcohol use: Yes    Comment: occasionally. rarely  . Drug use: No    Review of Systems Per HPI unless specifically indicated above     Objective:    BP 108/67   Pulse 89   Temp 97.9 F (36.6 C) (Temporal)   Resp 16   Ht 5\' 8"  (1.727 m)   Wt 243 lb (110.2 kg)   SpO2 96%   BMI 36.95 kg/m   Wt Readings from Last 3 Encounters:  10/08/19 243 lb (110.2 kg)  09/17/19 241 lb 9.6 oz (109.6 kg)  09/11/19 240 lb 12.8 oz (109.2 kg)    Physical Exam Vitals and nursing note reviewed.  Constitutional:      General: He is not  in acute distress.    Appearance: He is well-developed. He is not diaphoretic.     Comments: Well-appearing, comfortable, cooperative  HENT:     Head: Normocephalic and atraumatic.  Eyes:     General:        Right eye: No discharge.        Left eye: No discharge.     Conjunctiva/sclera: Conjunctivae normal.  Cardiovascular:     Rate and Rhythm: Normal rate.  Pulmonary:     Effort: Pulmonary effort is normal.  Musculoskeletal:     Left lower leg: Edema (generalized non pitting edema left lower leg including calf and lower ankle - +1 no erythema non tender on exam) present.     Comments: Left Knee Inspection: Normal appearance and symmetrical. No ecchymosis or effusion. Palpation: Mild tender left lateral knee superior to joint line. Moderate fine crepitus bilateral. ROM: Full active ROM bilaterally Special Testing: Lachman / Valgus/Varus tests negative with intact ligaments (ACL, MCL, LCL). McMurray negative without meniscus symptoms. Standing Thessaly with some lateral pain but with stability. Pain only provoked with weight bearing significant knee flexion on L Strength: 5/5 intact knee flex/ext, ankle dorsi/plantarflex Neurovascular: distally intact sensation light  touch and pulses  Antalgic gait due to pain  Skin:    General: Skin is warm and dry.     Findings: No erythema or rash.  Neurological:     Mental Status: He is alert and oriented to person, place, and time.  Psychiatric:        Behavior: Behavior normal.     Comments: Well groomed, good eye contact, normal speech and thoughts        Results for orders placed or performed in visit on 09/13/19  HM DIABETES EYE EXAM  Result Value Ref Range   HM Diabetic Eye Exam Retinopathy (A) No Retinopathy      Assessment & Plan:   Problem List Items Addressed This Visit    None    Visit Diagnoses    Pain and swelling of left lower leg    -  Primary   Relevant Orders   US Venous Img Lower Unilateral Left   Sprain of  lateral collateral ligament of left knee, subsequent encounter       Chronic pain of left knee       Relevant Medications   diclofenac Sodium (VOLTAREN) 1 % GEL   Primary osteoarthritis of left knee       Relevant Medications   diclofenac Sodium (VOLTAREN) 1 % GEL      Persisetnt, Acute L lateral knee pain from twisting injury Concern sprain LCL ligament Has secondary swelling of lower extremity LEFT lower leg and calf with some pain as well  Onset 4+ weeks ago - following acute injury step into hole with twisting motion without fall or other impact or trauma Known osteoarthritis multiple joints including knees, no recent x-rays but has moderate crepitus on exam  Some concern for meniscus given localized area of discomfort with knee flexion but has good stability on exam today - No prior history of knee surgery, arthroscopy  Plan: - refer to [facility] for consultation, X-ray and further management - defer X-rays here since we are referring to ortho today  - with significant swelling worsening on Left lower ext and calf, will pursue a venous doppler US ordered today to be scheduled. No acute evidence of DVT but concern with degree of persistent swelling, advised if sudden change or severe worsening swelling he can return sooner or go to hospital / urgent care if indicated  Rx Diclofenac voltaren topical 2-3 times daily PRN  Continue with conservative care, may use NSAID, Tylenol, muscle rub as needed, Knee brace PRN, RICE therapy, relative rest allow to heal from likely sprain.   Orders Placed This Encounter  Procedures  . US Venous Img Lower Unilateral Left    Standing Status:   Future    Standing Expiration Date:   10/07/2020    Order Specific Question:   Reason for Exam (SYMPTOM  OR DIAGNOSIS REQUIRED)    Answer:   left lower extremity calf pain and swelling, worse after knee injury, rule out DVT    Order Specific Question:   Preferred imaging location?    Answer:    [facility]  . Ambulatory referral to Orthopedic Surgery    Referral Priority:   Routine    Referral Type:   Surgical    Referral Reason:   Specialty Services Required    Requested Specialty:   Orthopedic Surgery    Number of Visits Requested:   1     Meds ordered this encounter  Medications  . diclofenac Sodium (VOLTAREN) 1 % GEL  Sig: Apply 2 g topically 3 (three) times daily as needed (left knee pain arthritis).    Dispense:  100 g    Refill:  2      Follow up plan: Return in about 4 weeks (around 11/05/2019), or if symptoms worsen or fail to improve, for follow-up 4 weeks Left knee pain swelling.   [name], [facility] 10/08/2019, 8:15 AM

## 2019-10-08 NOTE — Patient Instructions (Addendum)
Thank you for coming to the office today.  Ordered a Vascular Ultrasound to check the circulation and veins of the Left leg - to rule out any blood clot or other issues causing swelling.  ----------------------------  Referral to Orthopedic for evaluation of Left Knee - they will do x-rays and further treatment.  [facility] Address: [address], [place], [place] [address] Hours:  9AM-5PM Phone: [phone]  Try the new rx sent to [place] for the Voltaren (generic Diclofenac gel) anti inflammatory use 2-3 times a day as needed for pain relief.  Recommend to start taking Tylenol Extra Strength 500mg  tabs - take 1 to 2 tabs per dose (max 1000mg ) every 6-8 hours for pain (take regularly, don't skip a dose for next 7 days), max 24 hour daily dose is 6 tablets or 3000mg . In the future you can repeat the same everyday Tylenol course for 1-2 weeks at a time.   Use RICE therapy: - R - Rest / relative rest with activity modification avoid overuse of joint - I - Ice packs (make sure you use a towel or sock / something to protect skin) - C - Compression with flexible Knee Sleeve / ACE wrap to apply pressure and reduce swelling allowing more support - E - Elevation - if significant swelling, lift leg above heart level (toes above your nose) to help reduce swelling, most helpful at night after day of being on your feet    Please schedule a Follow-up Appointment to: Return in about 4 weeks (around 11/05/2019), or if symptoms worsen or fail to improve, for follow-up 4 weeks Left knee pain swelling.  If you have any other questions or concerns, please feel free to call the office or send a message through [facility]. You may also schedule an earlier appointment if necessary.  Additionally, you may be receiving a survey about your experience at our office within a few days to 1 week by e-mail or mail. We value your feedback.  [name], DO [place]

## 2019-10-10 ENCOUNTER — Other Ambulatory Visit: Payer: Self-pay

## 2019-10-10 ENCOUNTER — Ambulatory Visit
Admission: RE | Admit: 2019-10-10 | Discharge: 2019-10-10 | Disposition: A | Discharge status: Home / Self Care | Payer: No Typology Code available for payment source | Source: Ambulatory Visit | Attending: Family Medicine | Admitting: Family Medicine

## 2019-10-10 DIAGNOSIS — M7989 Other specified soft tissue disorders: Secondary | ICD-10-CM | POA: Insufficient documentation

## 2019-10-10 DIAGNOSIS — M79662 Pain in left lower leg: Secondary | ICD-10-CM | POA: Diagnosis not present

## 2019-10-11 ENCOUNTER — Ambulatory Visit: Payer: No Typology Code available for payment source | Attending: Neurology

## 2019-10-11 DIAGNOSIS — R0683 Snoring: Secondary | ICD-10-CM | POA: Diagnosis present

## 2019-10-11 DIAGNOSIS — G4733 Obstructive sleep apnea (adult) (pediatric): Secondary | ICD-10-CM | POA: Insufficient documentation

## 2019-10-14 ENCOUNTER — Other Ambulatory Visit: Payer: Self-pay

## 2019-10-18 ENCOUNTER — Encounter: Payer: Self-pay | Admitting: Family Medicine

## 2019-11-15 ENCOUNTER — Ambulatory Visit: Payer: No Typology Code available for payment source | Attending: Neurology

## 2019-11-15 DIAGNOSIS — G4733 Obstructive sleep apnea (adult) (pediatric): Secondary | ICD-10-CM | POA: Diagnosis present

## 2019-11-19 ENCOUNTER — Other Ambulatory Visit: Payer: Self-pay

## 2019-11-26 ENCOUNTER — Telehealth: Payer: Self-pay | Admitting: Family Medicine

## 2019-11-26 NOTE — Telephone Encounter (Signed)
Received result of CPAP Titration 2nd sleep study done on 11/15/19 from [facility] today.  Reviewed report, it should be scanned into system, I have messaged patient back on mychart to let him know we received the result now waiting on the order for CPAP Machine.  Can you call the [employer] or - [employer] (the person who faxed [place] the results was - Annye English, at [facility]: [phone].)  Or can call the main [facility] to check status of the CPAP machine orders to be sent to our office.  As patient was asking on how soon they can get their machine.  Thanks!  [name], [facility] 11/26/2019, 12:59 PM

## 2019-11-26 NOTE — Telephone Encounter (Signed)
Spoke to Shane Frazier--they only interpret the sleep study result and send order to the provider if patient require another sleep study but their company don't handle ordering CPAP machine --they used to handle in past but company has bought out by others, she suggested [facility] for CPAP machine. Also, inform the patient that wait could be little longer now since [facility] don't handle about ordering CPAP through them he and his Spouse understand very well.

## 2019-11-26 NOTE — Telephone Encounter (Signed)
Ok. Thanks for finding out and notifying patient.  We can try [facility].  Would you be able to reach out to them to request order form?  The sleep study results have been signed and dated by me and were in my "To be scanned" pile. They are likely up front ready to be scanned.  We likely will need to get the copy from the scan pile and make an extra copy so we can fax it to [facility] before it gets sent to be scanned.  [name], [facility] 11/26/2019, 4:46 PM

## 2019-11-27 NOTE — Telephone Encounter (Signed)
Spoke to [facility] --they need provider's Rx , sleep study and insurance information fax to [phone]. Will fax demographic as well.

## 2019-11-27 NOTE — Telephone Encounter (Signed)
Handwritten order on rx pad  CPAP Machine, 16 cm H2O, humidified, w/ Simplus full face mask,  Medium and supplies  Ready for fax  [name], [facility] 11/27/2019, 5:18 PM

## 2019-11-28 NOTE — Telephone Encounter (Signed)
Faxed to the [facility].

## 2019-12-09 NOTE — Telephone Encounter (Signed)
Patient came by office today in person, said [facility] could not read the last handwritten rx faxed by [place] on 11/28/19.  I wrote a new rx and gave to patient they will take it physically over to [facility]  [name], [facility] 12/09/2019, 3:43 PM

## 2020-01-30 ENCOUNTER — Other Ambulatory Visit: Payer: Self-pay | Admitting: Family Medicine

## 2020-01-30 DIAGNOSIS — E113293 Type 2 diabetes mellitus with mild nonproliferative diabetic retinopathy without macular edema, bilateral: Secondary | ICD-10-CM

## 2020-01-30 NOTE — Telephone Encounter (Signed)
Requested medication (s) are due for refill today: yes  Requested medication (s) are on the active medication list: {yes  Last refill:  10/16/18  Future visit scheduled: yes  Notes to clinic: Note from pharmacy stating that insurance pays for freestyle. Please send new script for meter and supplies    Requested Prescriptions  Pending Prescriptions Disp Refills   ACCU-CHEK GUIDE test strip [Pharmacy Med Name: ACCU-CHEK GUIDE STRP Strip] 200 strip 12    Sig: CHECK BLOOD SUGAR UP TO 2 X DAILY      Endocrinology: Diabetes - Testing Supplies Passed - 01/30/2020  3:27 PM      Passed - Valid encounter within last 12 months    Recent Outpatient Visits           3 months ago Pain and swelling of left lower leg   [place], DO   4 months ago Sprain of lateral collateral ligament of left knee, initial encounter   [place], DO   4 months ago Encounter for commercial driving license (CDL) exam   [facility], Lupita Raider, FNP   4 months ago Type 2 diabetes mellitus with both eyes 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, without long-term current use of insulin [facility])   [facility], Devonne Doughty, DO   8 months ago Type 2 diabetes mellitus with both eyes 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, without long-term current use of insulin [facility])   [facility], Devonne Doughty, DO       Future Appointments             In 1 week [place] [facility], Devonne Doughty, DO [facility], [place]   In 7 months [place], Devonne Doughty, [facility], [facility]

## 2020-01-31 MED ORDER — FREESTYLE LANCETS MISC: 12 refills | Status: DC

## 2020-01-31 MED ORDER — FREESTYLE LITE TEST VI STRP: ORAL_STRIP | 12 refills | Status: DC

## 2020-01-31 MED ORDER — FREESTYLE LITE DEVI: 0 refills | Status: DC

## 2020-01-31 MED ORDER — FREESTYLE CONTROL SOLUTION VI LIQD: 0 refills | Status: DC

## 2020-01-31 NOTE — Telephone Encounter (Signed)
Changed to [employer] Device.  [name], DO [facility] 01/31/2020, 8:39 AM

## 2020-02-03 ENCOUNTER — Other Ambulatory Visit: Payer: Self-pay | Admitting: Family Medicine

## 2020-02-03 DIAGNOSIS — E113293 Type 2 diabetes mellitus with mild nonproliferative diabetic retinopathy without macular edema, bilateral: Secondary | ICD-10-CM

## 2020-02-07 ENCOUNTER — Other Ambulatory Visit: Payer: Self-pay | Admitting: Family Medicine

## 2020-02-07 ENCOUNTER — Telehealth: Payer: Self-pay

## 2020-02-07 DIAGNOSIS — E113293 Type 2 diabetes mellitus with mild nonproliferative diabetic retinopathy without macular edema, bilateral: Secondary | ICD-10-CM

## 2020-02-07 MED ORDER — FREESTYLE LITE DEVI: 0 refills | Status: DC

## 2020-02-07 MED ORDER — FREESTYLE LITE TEST VI STRP: ORAL_STRIP | 12 refills | Status: DC

## 2020-02-07 MED ORDER — FREESTYLE LANCETS MISC: 12 refills | Status: DC

## 2020-02-07 MED ORDER — FREESTYLE CONTROL SOLUTION VI LIQD: 0 refills | Status: DC

## 2020-02-07 NOTE — Telephone Encounter (Signed)
Refill request

## 2020-02-07 NOTE — Addendum Note (Signed)
Addended by: Olin Hauser on: 02/07/2020 02:51 PM   Modules accepted: Orders

## 2020-02-07 NOTE — Telephone Encounter (Signed)
Re ordered Freestyle strips  [name], DO [facility] 02/07/2020, 2:21 PM

## 2020-02-10 ENCOUNTER — Other Ambulatory Visit: Payer: Self-pay

## 2020-02-10 ENCOUNTER — Ambulatory Visit (INDEPENDENT_AMBULATORY_CARE_PROVIDER_SITE_OTHER): Payer: No Typology Code available for payment source | Admitting: Family Medicine

## 2020-02-10 ENCOUNTER — Encounter: Payer: Self-pay | Admitting: Family Medicine

## 2020-02-10 VITALS — BP 138/84 | HR 77 | Temp 97.5°F | Resp 16 | Ht 67.5 in | Wt 246.0 lb

## 2020-02-10 DIAGNOSIS — E113293 Type 2 diabetes mellitus with mild nonproliferative diabetic retinopathy without macular edema, bilateral: Secondary | ICD-10-CM

## 2020-02-10 DIAGNOSIS — G4733 Obstructive sleep apnea (adult) (pediatric): Secondary | ICD-10-CM

## 2020-02-10 DIAGNOSIS — R351 Nocturia: Secondary | ICD-10-CM | POA: Diagnosis not present

## 2020-02-10 DIAGNOSIS — Z0000 Encounter for general adult medical examination without abnormal findings: Secondary | ICD-10-CM | POA: Diagnosis not present

## 2020-02-10 DIAGNOSIS — E785 Hyperlipidemia, unspecified: Secondary | ICD-10-CM

## 2020-02-10 DIAGNOSIS — E039 Hypothyroidism, unspecified: Secondary | ICD-10-CM

## 2020-02-10 DIAGNOSIS — I10 Essential (primary) hypertension: Secondary | ICD-10-CM

## 2020-02-10 DIAGNOSIS — E1169 Type 2 diabetes mellitus with other specified complication: Secondary | ICD-10-CM

## 2020-02-10 NOTE — Assessment & Plan Note (Signed)
Elevated LDL previously Last lipid panel 2020  Plan: Check fasting lipid 1. Continue current meds - Atorvastatin 20mg  daily 2. Encourage improved lifestyle - low carb/cholesterol, reduce portion size, continue improving regular exercise

## 2020-02-10 NOTE — Progress Notes (Signed)
Subjective:    Patient ID: Shane Picket., male    DOB: 04-15-1966, 54 y.o.   MRN: [id number]  Shane Frazier. is a 54 y.o. male presenting on 02/10/2020 for Annual Exam, Hypertension, and Diabetes   HPI   Here for Annual Physical and Lab Orders today.  CHRONIC DM, Type 2: Improved overall. Last A1c had improved to 8, now due for repeat Doing well on Ozempic CBGs:Home readings improved - ordered Freestyle Lite new testing supplies recently Meds:Ozempic 38m weekly injection, Metformin5070min AM and 100083mM w meal Reports good compliance. 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 Still not on CPAP yet picks up tomorrow see below Current Meds -Lisinopril 24m82mily, Diltiazem LA 424mg18mly, HCTZ 25mg 38my Reports good compliance, took meds today. Tolerating well, w/o complaints. Denies CP, dyspnea, HA, edema, dizziness / lightheadedness  OSA History of witnessed sleep apnea events overnight, stopped breathing. Admits snoring overnight loudly. Has some muscle jerk and jumps in sleep. Admits excessive daytime sleepiness. Completed Sleep [facility] chart[facility] - Confirm OSA AHI 47.8/hr. Signed order for CPAP Titration next. CPAP Titration 11/15/19 - severe OSA, improved on CPAP 16 cm H2O, humidified, w/ F&P Simplus full face mask, medium 11/27/19 - [facility] [place] + Sleep study and ins on 11/28/19 for order - He will pick up CPAP tomorrow  Health Maintenance:  Due for Flu Shot, declines today despite counseling on benefits   Depression screen PHQ 2/[facility]/25/2021 05/13/2019  03/25/2019  Decreased Interest 0 0 0  Down, Depressed, Hopeless 0 0 0  PHQ - 2 Score 0 0 0    Past Medical History:  Diagnosis Date  . CHF (congestive heart failure), NYHA class I, acute on chronic, systolic (HCC)  [place]   cause of seizure after hit head at work  . Difficult intubation    patient unaware of this diagnosis  . ED (erectile dysfunction)   . Hypothyroidism   . Seizures (HCC) [id number]   X 1-PT HIT HIS HEAD AT WORK AND THEN HAD A SEIZURE   Past Surgical History:  Procedure Laterality Date  . COLONOSCOPY WITH PROPOFOL N/A 12/07/2018   Procedure: COLONOSCOPY WITH PROPOFOL;  Surgeon: Anna, Jonathon Bellows [facility];  Service: Gastroenterology;  Laterality: N/A;  . FLEXIBLE BRONCHOSCOPY N/A 03/27/2018   Procedure: [place] Falls[place];  Surgeon: SimondWilhelmina Mcardle Location: [facility];  Service: Pulmonary;  Laterality: N/A;  . HERNIA REPAIR     as a child,umbilical  . SHOULDER ARTHROSCOPY WITH ROTATOR CUFF REPAIR AND SUBACROMIAL DECOMPRESSION Left 09/09/2015   Procedure: SHOULDER ARTHROSCOPY, SUBACROMIAL DECOMPRESSION,BICEPS TENSON RELEASE AND MINI OPEN ROTATOR CUFF REPAIR;  Surgeon: HaroldLeanor Kail Location: [facility] ORS;  Service: Orthopedics;  Laterality: Left;  . VENTMarland KitchenAL HERNIA REPAIR N/A 10/27/2017   Procedure: HERNIA REPAIR VENTRAL ADULT;  Surgeon: Pabon,Jules Husbands Location: [facility] ORS;  Service: General;  Laterality: N/A;   Social History   Socioeconomic History  . Marital status: Married    Spouse name: Not on file  . Number of children: Not on file  .  Years of education: Not on file  . Highest education level: Not on file  Occupational History  . Not on file  Tobacco Use  . Smoking status: Never Smoker  . Smokeless tobacco: Never Used  Vaping Use  . Vaping Use: Never used  Substance and Sexual Activity  . Alcohol use: Yes    Comment: occasionally. rarely  . Drug use: No  . Sexual activity: Yes  Other  Topics Concern  . Not on file  Social History Narrative  . Not on file   Social Determinants of Health   Financial Resource Strain:   . Difficulty of Paying Living Expenses: Not on file  Food Insecurity:   . Worried About Charity fundraiser in the Last Year: Not on file  . Ran Out of Food in the Last Year: Not on file  Transportation Needs:   . Lack of Transportation (Medical): Not on file  . Lack of Transportation (Non-Medical): Not on file  Physical Activity:   . Days of Exercise per Week: Not on file  . Minutes of Exercise per Session: Not on file  Stress:   . Feeling of Stress : Not on file  Social Connections:   . Frequency of Communication with Friends and Family: Not on file  . Frequency of Social Gatherings with Friends and Family: Not on file  . Attends Religious Services: Not on file  . Active Member of Clubs or Organizations: Not on file  . Attends Archivist Meetings: Not on file  . Marital Status: Not on file  Intimate Partner Violence:   . Fear of Current or Ex-Partner: Not on file  . Emotionally Abused: Not on file  . Physically Abused: Not on file  . Sexually Abused: Not on file   Family History  Problem Relation Age of Onset  . Hypertension Mother   . Cancer Father   . Diabetes Sister   . Thyroid cancer Sister    Current Outpatient Medications on File Prior to Visit  Medication Sig  . atorvastatin (LIPITOR) 20 MG tablet Take 1 tablet (20 mg total) by mouth at bedtime.  . Blood Glucose Calibration (FREESTYLE CONTROL SOLUTION) LIQD Use to calibrate glucometer to check blood sugar  . Blood Glucose Monitoring Suppl (FREESTYLE LITE) DEVI Use to check blood sugar up to 2 x daily  . diclofenac Sodium (VOLTAREN) 1 % GEL Apply 2 g topically 3 (three) times daily as needed (left knee pain arthritis).  Marland Kitchen diltiazem (CARDIZEM LA) 420 MG 24 hr tablet TAKE 1 TABLET (420 MG TOTAL) BY MOUTH EVERY MORNING.  Marland Kitchen FREESTYLE LITE test strip Use to check blood sugar  up to 2 x daily  . hydrochlorothiazide (HYDRODIURIL) 25 MG tablet Take 1 tablet (25 mg total) by mouth daily.  . Lancets (FREESTYLE) lancets Check blood sugar up to 2 x daily  . levothyroxine (SYNTHROID) 200 MCG tablet TAKE 1 TABLET (200 MCG TOTAL) BY MOUTH DAILY BEFORE BREAKFAST.  Marland Kitchen lisinopril (ZESTRIL) 20 MG tablet Take 1 tablet (20 mg total) by mouth every morning.  . metFORMIN (GLUCOPHAGE) 500 MG tablet TAKE 1 TABLET (500 MG TOTAL) BY MOUTH 2 (TWO) TIMES DAILY WITH A MEAL.  Marland Kitchen OZEMPIC, 1 MG/DOSE, 4 MG/3ML SOPN Inject 1 mg into the skin once a week.  . tadalafil (CIALIS) 20 MG tablet Take 1 tablet (20 mg total) by mouth daily as needed for erectile dysfunction.   No current facility-administered medications on file prior to visit.    Review of  Systems  Constitutional: Negative for activity change, appetite change, chills, diaphoresis, fatigue and fever.  HENT: Negative for congestion and hearing loss.   Eyes: Negative for visual disturbance.  Respiratory: Negative for cough, chest tightness, shortness of breath and wheezing.   Cardiovascular: Negative for chest pain, palpitations and leg swelling.  Gastrointestinal: Negative for abdominal pain, constipation, diarrhea, nausea and vomiting.  Endocrine: Negative for cold intolerance.  Genitourinary: Negative for dysuria, frequency and hematuria.  Musculoskeletal: Negative for arthralgias and neck pain.  Skin: Negative for rash.  Allergic/Immunologic: Negative for environmental allergies.  Neurological: Negative for dizziness, weakness, light-headedness, numbness and headaches.  Hematological: Negative for adenopathy.  Psychiatric/Behavioral: Negative for behavioral problems, dysphoric mood and sleep disturbance.   Per HPI unless specifically indicated above      Objective:    BP 138/84 (BP Location: Left Arm, Cuff Size: Normal)   Pulse 77   Temp (!) 97.5 F (36.4 C) (Temporal)   Resp 16   Ht 5' 7.5" (1.715 m)   Wt 246 lb (111.6  kg)   SpO2 98%   BMI 37.96 kg/m   Wt Readings from Last 3 Encounters:  02/10/20 246 lb (111.6 kg)  10/08/19 243 lb (110.2 kg)  09/17/19 241 lb 9.6 oz (109.6 kg)    Physical Exam Vitals and nursing note reviewed.  Constitutional:      General: He is not in acute distress.    Appearance: He is well-developed. He is not diaphoretic.     Comments: Well-appearing, comfortable, cooperative  HENT:     Head: Normocephalic and atraumatic.  Eyes:     General:        Right eye: No discharge.        Left eye: No discharge.     Conjunctiva/sclera: Conjunctivae normal.     Pupils: Pupils are equal, round, and reactive to light.  Neck:     Thyroid: No thyromegaly.     Vascular: No carotid bruit.  Cardiovascular:     Rate and Rhythm: Normal rate and regular rhythm.     Heart sounds: Normal heart sounds. No murmur heard.   Pulmonary:     Effort: Pulmonary effort is normal. No respiratory distress.     Breath sounds: Normal breath sounds. No wheezing or rales.  Abdominal:     General: Bowel sounds are normal. There is no distension.     Palpations: Abdomen is soft. There is no mass.     Tenderness: There is no abdominal tenderness.  Musculoskeletal:        General: No tenderness. Normal range of motion.     Cervical back: Normal range of motion and neck supple.     Right lower leg: No edema.     Left lower leg: No edema.     Comments: Upper / Lower Extremities: - Normal muscle tone, strength bilateral upper extremities 5/5, lower extremities 5/5  Lymphadenopathy:     Cervical: No cervical adenopathy.  Skin:    General: Skin is warm and dry.     Findings: No erythema or rash.  Neurological:     Mental Status: He is alert and oriented to person, place, and time.     Comments: Distal sensation intact to light touch all extremities  Psychiatric:        Behavior: Behavior normal.     Comments: Well groomed, good eye contact, normal speech and thoughts      Recent Labs     05/13/19 0942 09/10/19 0908  HGBA1C 9.3*  8.0*    Results for orders placed or performed in visit on 09/13/19  HM DIABETES EYE EXAM  Result Value Ref Range   HM Diabetic Eye Exam Retinopathy (A) No Retinopathy      Assessment & Plan:   Problem List Items Addressed This Visit    Type 2 diabetes mellitus with diabetic retinopathy ([place])    Previously, significantly improved A1c from 9.3 down to 8.0 on GLP1 agent and lifestyle Due repeat A1c With some hyperglycemia still reported on CBGs Without Hypoglycemia Complicated by DM retinopathy  Plan:  1. Keep on current ozempic 24m weekly - CONTINUE Metformin 500 BID 2. Encourage improved lifestyle - low carb, low sugar diet, reduce portion size, continue improving regular exercise 3. Check CBG, bring log to next visit for review      OSA (obstructive sleep apnea) - Primary    Uncontrolled OSA - not started CPAP yet, will pick up tomorrow See prior notes Likely elevated BP / HTN secondary component to OSA  Once starts CPAP may need to return within 30-90 days based on insurance requirement if requested for surveillance and follow-up after initial CPAP      Morbid obesity ([place]    Morbid obesity with BMI >37 Comorbid conditions type 2 diabetes, HLD, HTN OSA      Hypothyroidism    Controlled clinically Last lab normal thyroid panel 2020 Check thyroid panel Continue current Levothyroxine 2080m daily      Hyperlipidemia associated with type 2 diabetes mellitus ([place]   Elevated LDL previously Last lipid panel 2020  Plan: Check fasting lipid 1. Continue current meds - Atorvastatin 2013maily 2. Encourage improved lifestyle - low carb/cholesterol, reduce portion size, continue improving regular exercise      Essential hypertension    Mildly elevated initial BP, repeat manual check improved but still elevated. - Home BP readings reviewed  Complication OSA   Plan:  1. Continue current BP regimen - Diltiazem 420m8mily,  Lisinopril 20mg50mly, HCTZ 25mg 63my 2. Encourage improved lifestyle - low sodium diet, regular exercise 3. Continue monitor BP outside office, bring readings to next visit, if persistently >140/90 or new symptoms notify office sooner  Recommend start CPAP soon once gets, can help lower his BP, if he has secondary HTN elevated due to OSA.       Other Visit Diagnoses    Annual physical exam       Nocturia          Updated Health Maintenance information - Declines Flu shot and COVID shot at this time Reviewed recent lab results with patient Encouraged improvement to lifestyle with diet and exercise - Goal of weight loss   No orders of the defined types were placed in this encounter.     Follow up plan: Return in about 4 months (around 06/12/2020) for 4 month follow-up DM A1c, HTN OSA.  [facility] 02/10/2020, 8:31 AM

## 2020-02-10 NOTE — Assessment & Plan Note (Signed)
Morbid obesity with BMI >37 Comorbid conditions type 2 diabetes, HLD, HTN OSA 

## 2020-02-10 NOTE — Assessment & Plan Note (Signed)
Previously, significantly improved A1c from 9.3 down to 8.0 on GLP1 agent and lifestyle Due repeat A1c With some hyperglycemia still reported on CBGs Without Hypoglycemia Complicated by DM retinopathy  Plan:  1. Keep on current ozempic 1mg  weekly - CONTINUE Metformin 500 BID 2. Encourage improved lifestyle - low carb, low sugar diet, reduce portion size, continue improving regular exercise 3. Check CBG, bring log to next visit for review

## 2020-02-10 NOTE — Assessment & Plan Note (Signed)
Uncontrolled OSA - not started CPAP yet, will pick up tomorrow See prior notes Likely elevated BP / HTN secondary component to OSA  Once starts CPAP may need to return within 30-90 days based on insurance requirement if requested for surveillance and follow-up after initial CPAP

## 2020-02-10 NOTE — Patient Instructions (Addendum)
Thank you for coming to the office today.  If you plan to follow-up with Urologist you can just call to schedule since already established.  Go ahead and schedule w/ Eye Doctor  [facility]   Address: [address] [place], [place] [address] Phone: [phone]  Website: [email or web address]  I think BP will improve with using the CPAP machine  You may need to come back for a quick CPAP check visit to prove to insurance it is working. Usually they let you know or we let you know if this is required.  Please schedule a Follow-up Appointment to: Return in about 4 months (around 06/12/2020) for 4 month follow-up DM A1c, HTN OSA.  If you have any other questions or concerns, please feel free to call the office or send a message through [place]. You may also schedule an earlier appointment if necessary.  Additionally, you may be receiving a survey about your experience at our office within a few days to 1 week by e-mail or mail. We value your feedback.  [name], DO [place]

## 2020-02-10 NOTE — Assessment & Plan Note (Signed)
Controlled clinically Last lab normal thyroid panel 2020 Check thyroid panel Continue current Levothyroxine 248mcg daily

## 2020-02-10 NOTE — Assessment & Plan Note (Signed)
Mildly elevated initial BP, repeat manual check improved but still elevated. - Home BP readings reviewed  Complication OSA   Plan:  1. Continue current BP regimen - Diltiazem 420mg  daily, Lisinopril 20mg  daily, HCTZ 25mg  daily 2. Encourage improved lifestyle - low sodium diet, regular exercise 3. Continue monitor BP outside office, bring readings to next visit, if persistently >140/90 or new symptoms notify office sooner  Recommend start CPAP soon once gets, can help lower his BP, if he has secondary HTN elevated due to OSA.

## 2020-02-11 LAB — LIPID PANEL
Cholesterol: 210 mg/dL — ABNORMAL HIGH (ref <200)
HDL: 58 mg/dL (ref >=40)
LDL Cholesterol (Calc): 136 mg/dL (calc) — ABNORMAL HIGH
Non-HDL Cholesterol (Calc): 152 mg/dL (calc) — ABNORMAL HIGH (ref <130)
Total CHOL/HDL Ratio: 3.6 (calc) (ref <5.0)
Triglycerides: 70 mg/dL (ref <150)

## 2020-02-11 LAB — CBC WITH DIFFERENTIAL/PLATELET
Absolute Monocytes: 400 cells/uL (ref 200–950)
Basophils Absolute: 30 cells/uL (ref 0–200)
Basophils Relative: 0.6 %
Eosinophils Absolute: 140 cells/uL (ref 15–500)
Eosinophils Relative: 2.8 %
HCT: 44.4 % (ref 38.5–50.0)
Hemoglobin: 14.9 g/dL (ref 13.2–17.1)
Lymphs Abs: 1485 cells/uL (ref 850–3900)
MCH: 29.2 pg (ref 27.0–33.0)
MCHC: 33.6 g/dL (ref 32.0–36.0)
MCV: 87.1 fL (ref 80.0–100.0)
MPV: 10.6 fL (ref 7.5–12.5)
Monocytes Relative: 8 %
Neutro Abs: 2945 cells/uL (ref 1500–7800)
Neutrophils Relative %: 58.9 %
Platelets: 245 10*3/uL (ref 140–400)
RBC: 5.1 10*6/uL (ref 4.20–5.80)
RDW: 12.7 % (ref 11.0–15.0)
Total Lymphocyte: 29.7 %
WBC: 5 10*3/uL (ref 3.8–10.8)

## 2020-02-11 LAB — COMPLETE METABOLIC PANEL WITH GFR
AG Ratio: 1.3 (calc) (ref 1.0–2.5)
ALT: 17 U/L (ref 9–46)
AST: 18 U/L (ref 10–35)
Albumin: 3.9 g/dL (ref 3.6–5.1)
Alkaline phosphatase (APISO): 56 U/L (ref 35–144)
BUN: 15 mg/dL (ref 7–25)
CO2: 28 mmol/L (ref 20–32)
Calcium: 9.4 mg/dL (ref 8.6–10.3)
Chloride: 104 mmol/L (ref 98–110)
Creat: 1.08 mg/dL (ref 0.70–1.33)
GFR, Est African American: 90 mL/min/{1.73_m2} (ref >=60)
GFR, Est Non African American: 78 mL/min/{1.73_m2} (ref >=60)
Globulin: 3 g/dL (calc) (ref 1.9–3.7)
Glucose, Bld: 147 mg/dL — ABNORMAL HIGH (ref 65–99)
Potassium: 3.7 mmol/L (ref 3.5–5.3)
Sodium: 139 mmol/L (ref 135–146)
Total Bilirubin: 0.5 mg/dL (ref 0.2–1.2)
Total Protein: 6.9 g/dL (ref 6.1–8.1)

## 2020-02-11 LAB — TSH: TSH: 4.9 mIU/L — ABNORMAL HIGH (ref 0.40–4.50)

## 2020-02-11 LAB — HEMOGLOBIN A1C
Hgb A1c MFr Bld: 8 % of total Hgb — ABNORMAL HIGH (ref <5.7)
Mean Plasma Glucose: 183 (calc)
eAG (mmol/L): 10.1 (calc)

## 2020-02-11 LAB — T4, FREE: Free T4: 1.2 ng/dL (ref 0.8–1.8)

## 2020-02-11 LAB — PSA: PSA: 0.26 ng/mL (ref <=4.0)

## 2020-02-13 ENCOUNTER — Telehealth: Payer: Self-pay | Admitting: Family Medicine

## 2020-02-13 NOTE — Telephone Encounter (Signed)
Patient has just recently received his CPAP.  Fax reviewed today from [place].  Please call him to schedule follow-up:  He will be required to have face to face office visit with me between the dates of:  03-13-2020 and no later than 05-11-2020  We will need to document that he is using the CPAP and benefiting from it and he is compliant.  Required to use the CPAP for >4 hours per night on 70% of nights during at least consecutive 30 day period (within first 3 months of starting CPAP)  This is required to approve CPAP for future supplies / insurance payment.  Requirement for [place] to FAX copy of this office visit note to:  [facility] [place] [address] Ph: [phone] Fax [place], [place] [facility] 02/13/2020, 12:43 PM

## 2020-02-13 NOTE — Telephone Encounter (Signed)
Patient's spouse advised.  

## 2020-02-13 NOTE — Telephone Encounter (Signed)
Left message

## 2020-03-11 LAB — HM DIABETES EYE EXAM

## 2020-03-23 ENCOUNTER — Other Ambulatory Visit: Payer: Self-pay

## 2020-03-23 ENCOUNTER — Encounter: Payer: Self-pay | Admitting: Family Medicine

## 2020-03-23 ENCOUNTER — Ambulatory Visit (INDEPENDENT_AMBULATORY_CARE_PROVIDER_SITE_OTHER): Payer: No Typology Code available for payment source | Admitting: Family Medicine

## 2020-03-23 VITALS — BP 132/75 | HR 78 | Temp 97.5°F | Resp 16 | Ht 67.5 in | Wt 245.6 lb

## 2020-03-23 DIAGNOSIS — G4733 Obstructive sleep apnea (adult) (pediatric): Secondary | ICD-10-CM

## 2020-03-23 DIAGNOSIS — Z9989 Dependence on other enabling machines and devices: Secondary | ICD-10-CM

## 2020-03-23 NOTE — Assessment & Plan Note (Signed)
On CPAP, new treatment now [place] usage for past 30 days, he used 22 out of 30 days, but several days 5-7 days used < 1 hour.  He has improved his adherence now. He will try to improve this and return for re-evaluation to document this at next apt before 05/11/20  We will need to document that he is using the CPAP and benefiting from it and he is compliant.  Required to use the CPAP for >4 hours per night on 70% of nights during at least consecutive 30 day period (within first 3 months of starting CPAP)

## 2020-03-23 NOTE — Patient Instructions (Addendum)
Thank you for coming to the office today.  We could not approve the CPAP today, due to under usage.  Here is the criteria  Between 03-13-2020 and no later than 05-11-2020 - will need 30 day consecutive period, with >70% of nights (at least 21 out of 30 nights) using CPAP >4 hours or more.  I looked through your data and it shows you used it for 22 of 30 nights, but on several nights about 5-7 nights used it < 1 hour.  Try your best to keep up the good work on CPAP and use it every night if possible or at least 21 out of next 30 nights, and >4 hours each of those nights.  We can get you back in early January 2022 and re-check your data. If you meet the mark we can approve the CPAP.  We can also check A1c.   Please schedule a Follow-up Appointment to: Return in about 4 weeks (around 04/20/2020) for 4 weeks CPAP OSA, keep February apt for diabetes.  If you have any other questions or concerns, please feel free to call the office or send a message through [place]. You may also schedule an earlier appointment if necessary.  Additionally, you may be receiving a survey about your experience at our office within a few days to 1 week by e-mail or mail. We value your feedback.  [name], DO [place]

## 2020-03-23 NOTE — Progress Notes (Signed)
Subjective:    Patient ID: Shane Picket., male    DOB: 01/23/1966, 54 y.o.   MRN: [id number]  Shane Schueller. is a 54 y.o. male presenting on 03/23/2020 for Sleep Apnea (discuss after CPAP machine)   HPI   OSA, on CPAP Background information : History of witnessed sleep apnea events overnight, stopped breathing. Admits snoring overnight loudly. Has some muscle jerk and jumps in sleep. Prior history excessive daytime sleepiness. - he has completed [facility] sleep study series at [facility]. He has new CPAP from [place] now.  CPAP Start 02/22/20 through 03/22/20 - he used CPAP 22 out of 30 nights. At least 6-7 nights were under used < 1-2 hours. Rest of nights were >4 hours.  Today he is doing well, feels improved on CPAP, more rested. Still benefiting from it and starting to use it more. He admits several nights not wearing it due to interruptions in his sleep schedule with caring for his father in law at home and he got limited sleep those nights.  Denies headache, elevated BP, chest pain, dyspnea.   Depression screen [facility] 2/9 09/10/2019 05/13/2019 03/25/2019  Decreased Interest 0 0 0  Down, Depressed, Hopeless 0 0 0  PHQ - 2 Score 0 0 0    Social History   Tobacco Use  . Smoking status: Never Smoker  . Smokeless tobacco: Never Used  Vaping Use  . Vaping Use: Never used  Substance Use Topics  . Alcohol use: Yes    Comment: occasionally. rarely  . Drug use: No    Review of Systems Per HPI unless specifically indicated above     Objective:    BP 132/75   Pulse 78   Temp (!) 97.5 F (36.4 C) (Temporal)   Resp 16   Ht 5' 7.5" (1.715 m)   Wt 245 lb 9.6 oz (111.4 kg)   SpO2 98%   BMI 37.90 kg/m   Wt Readings from Last 3 Encounters:  03/23/20 245 lb 9.6 oz (111.4 kg)  02/10/20 246 lb (111.6 kg)  10/08/19 243 lb (110.2 kg)    Physical Exam Vitals and nursing note reviewed.  Constitutional:      General: He is not in acute distress.    Appearance: He is well-developed.  He is not diaphoretic.     Comments: Well-appearing, comfortable, cooperative  HENT:     Head: Normocephalic and atraumatic.  Eyes:     General:        Right eye: No discharge.        Left eye: No discharge.     Conjunctiva/sclera: Conjunctivae normal.  Cardiovascular:     Rate and Rhythm: Normal rate.  Pulmonary:     Effort: Pulmonary effort is normal.  Skin:    General: Skin is warm and dry.     Findings: No erythema or rash.  Neurological:     Mental Status: He is alert and oriented to person, place, and time.  Psychiatric:        Behavior: Behavior normal.     Comments: Well groomed, good eye contact, normal speech and thoughts      Results for orders placed or performed in visit on 02/10/20  T4, free  Result Value Ref Range   Free T4 1.2 0.8 - 1.8 ng/dL  TSH  Result Value Ref Range   TSH 4.90 (H) 0.40 - 4.50 mIU/L  PSA  Result Value Ref Range   PSA 0.26 < OR = 4.0  ng/mL  Lipid panel  Result Value Ref Range   Cholesterol 210 (H) <200 mg/dL   HDL 58 > OR = 40 mg/dL   Triglycerides 70 <150 mg/dL   LDL Cholesterol (Calc) 136 (H) mg/dL (calc)   Total CHOL/HDL Ratio 3.6 <5.0 (calc)   Non-HDL Cholesterol (Calc) 152 (H) <130 mg/dL (calc)  COMPLETE METABOLIC PANEL WITH GFR  Result Value Ref Range   Glucose, Bld 147 (H) 65 - 99 mg/dL   BUN 15 7 - 25 mg/dL   Creat 1.08 0.70 - 1.33 mg/dL   GFR, Est Non African American 78 > OR = 60 mL/min/1.37m2   GFR, Est African American 90 > OR = 60 mL/min/1.35m2   BUN/Creatinine Ratio NOT APPLICABLE 6 - 22 (calc)   Sodium 139 135 - 146 mmol/L   Potassium 3.7 3.5 - 5.3 mmol/L   Chloride 104 98 - 110 mmol/L   CO2 28 20 - 32 mmol/L   Calcium 9.4 8.6 - 10.3 mg/dL   Total Protein 6.9 6.1 - 8.1 g/dL   Albumin 3.9 3.6 - 5.1 g/dL   Globulin 3.0 1.9 - 3.7 g/dL (calc)   AG Ratio 1.3 1.0 - 2.5 (calc)   Total Bilirubin 0.5 0.2 - 1.2 mg/dL   Alkaline phosphatase (APISO) 56 35 - 144 U/L   AST 18 10 - 35 U/L   ALT 17 9 - 46 U/L  CBC with  Differential/Platelet  Result Value Ref Range   WBC 5.0 3.8 - 10.8 Thousand/uL   RBC 5.10 4.20 - 5.80 Million/uL   Hemoglobin 14.9 13.2 - 17.1 g/dL   HCT 44.4 38 - 50 %   MCV 87.1 80.0 - 100.0 fL   MCH 29.2 27.0 - 33.0 pg   MCHC 33.6 32.0 - 36.0 g/dL   RDW 12.7 11.0 - 15.0 %   Platelets 245 140 - 400 Thousand/uL   MPV 10.6 7.5 - 12.5 fL   Neutro Abs 2,945 1,500 - 7,800 cells/uL   Lymphs Abs 1,485 850 - 3,900 cells/uL   Absolute Monocytes 400 200 - 950 cells/uL   Eosinophils Absolute 140 15.0 - 500.0 cells/uL   Basophils Absolute 30 0.0 - 200.0 cells/uL   Neutrophils Relative % 58.9 %   Total Lymphocyte 29.7 %   Monocytes Relative 8.0 %   Eosinophils Relative 2.8 %   Basophils Relative 0.6 %  Hemoglobin A1c  Result Value Ref Range   Hgb A1c MFr Bld 8.0 (H) <5.7 % of total Hgb   Mean Plasma Glucose 183 (calc)   eAG (mmol/L) 10.1 (calc)      Assessment & Plan:   Problem List Items Addressed This Visit    OSA on CPAP - Primary    On CPAP, new treatment now [place] Reviewed usage for past 30 days, he used 22 out of 30 days, but several days 5-7 days used < 1 hour.  He has improved his adherence now. He will try to improve this and return for re-evaluation to document this at next apt before 05/11/20  We will need to document that he is using the CPAP and benefiting from it and he is compliant.  Required to use the CPAP for >4 hours per night on 70% of nights during at least consecutive 30 day period (within first 3 months of starting CPAP)           No orders of the defined types were placed in this encounter.     Follow up plan: Return in  about 4 weeks (around 04/20/2020) for 4 weeks CPAP OSA, keep February apt for diabetes.   [name], [facility] 03/23/2020, 1:49 PM

## 2020-04-07 ENCOUNTER — Other Ambulatory Visit: Payer: Self-pay | Admitting: Urology

## 2020-04-07 DIAGNOSIS — N5201 Erectile dysfunction due to arterial insufficiency: Secondary | ICD-10-CM

## 2020-04-13 ENCOUNTER — Other Ambulatory Visit: Payer: Self-pay | Admitting: Family Medicine

## 2020-04-13 ENCOUNTER — Encounter: Payer: Self-pay | Admitting: Urology

## 2020-04-13 ENCOUNTER — Other Ambulatory Visit: Payer: Self-pay

## 2020-04-13 ENCOUNTER — Ambulatory Visit (INDEPENDENT_AMBULATORY_CARE_PROVIDER_SITE_OTHER): Payer: No Typology Code available for payment source | Admitting: Urology

## 2020-04-13 DIAGNOSIS — N5201 Erectile dysfunction due to arterial insufficiency: Secondary | ICD-10-CM | POA: Diagnosis not present

## 2020-04-13 DIAGNOSIS — E113293 Type 2 diabetes mellitus with mild nonproliferative diabetic retinopathy without macular edema, bilateral: Secondary | ICD-10-CM

## 2020-04-13 MED ORDER — TADALAFIL 20 MG PO TABS: ORAL_TABLET | ORAL | 0 refills | Status: DC

## 2020-04-13 NOTE — Progress Notes (Signed)
04/13/2020 9:27 AM   Shane Frazier. 05-05-1965 [id number]  Referring provider: Olin Hauser, DO [address] [place],  [place] [address]  Chief Complaint  Patient presents with  . Erectile Dysfunction    HPI: 54 y.o. male presents for follow-up of ED.   Initially seen 01/07/2019  Significant organic risk factors  Sildenafil was not effective and he desired a trial of tadalafil; second line options were discussed  Tadalafil did produce increased tumescence but no rigidity   PMH: Past Medical History:  Diagnosis Date  . CHF (congestive heart failure), NYHA class I, acute on chronic, systolic ([place])   . Concussion 2017   cause of seizure after hit head at work  . Difficult intubation    patient unaware of this diagnosis  . ED (erectile dysfunction)   . Hypothyroidism   . Seizures ([place]) 05-20-15   X 1-PT HIT HIS HEAD AT WORK AND THEN HAD A SEIZURE    Surgical History: Past Surgical History:  Procedure Laterality Date  . COLONOSCOPY WITH PROPOFOL N/A 12/07/2018   Procedure: COLONOSCOPY WITH PROPOFOL;  Surgeon: Jonathon Bellows, MD;  Location: [facility] ENDOSCOPY;  Service: Gastroenterology;  Laterality: N/A;  . FLEXIBLE BRONCHOSCOPY N/A 03/27/2018   Procedure: [place] With C-arm;  Surgeon: [name], MD;  Location: [facility] ORS;  Service: Pulmonary;  Laterality: N/A;  . HERNIA REPAIR     as a child,umbilical  . SHOULDER ARTHROSCOPY WITH ROTATOR CUFF REPAIR AND SUBACROMIAL DECOMPRESSION Left 09/09/2015   Procedure: SHOULDER ARTHROSCOPY, SUBACROMIAL DECOMPRESSION,BICEPS TENSON RELEASE AND MINI OPEN ROTATOR CUFF REPAIR;  Surgeon: Leanor Kail, MD;  Location: [facility] ORS;  Service: Orthopedics;  Laterality: Left;  Marland Kitchen VENTRAL HERNIA REPAIR N/A 10/27/2017   Procedure: HERNIA REPAIR VENTRAL ADULT;  Surgeon: Jules Husbands, MD;  Location: [facility] ORS;  Service: General;  Laterality: N/A;    Home Medications:  Allergies as of 04/13/2020      Reactions    Other Other (See Comments)   BRAZILIAN NUTS ONLY-"THROAT CLOSES UP"      Medication List       Accurate as of April 13, 2020  9:27 AM. If you have any questions, ask your nurse or doctor.        atorvastatin 20 MG tablet Commonly known as: LIPITOR Take 1 tablet (20 mg total) by mouth at bedtime.   diclofenac Sodium 1 % Gel Commonly known as: VOLTAREN Apply 2 g topically 3 (three) times daily as needed (left knee pain arthritis).   diltiazem 420 MG 24 hr tablet Commonly known as: CARDIZEM LA TAKE 1 TABLET (420 MG TOTAL) BY MOUTH EVERY MORNING.   FreeStyle Control Solution Liqd Use to calibrate glucometer to check blood sugar   freestyle lancets Check blood sugar up to 2 x daily   FreeStyle Lite Devi Use to check blood sugar up to 2 x daily   FREESTYLE LITE test strip Generic drug: glucose blood Use to check blood sugar up to 2 x daily   hydrochlorothiazide 25 MG tablet Commonly known as: HYDRODIURIL Take 1 tablet (25 mg total) by mouth daily.   levothyroxine 200 MCG tablet Commonly known as: SYNTHROID TAKE 1 TABLET (200 MCG TOTAL) BY MOUTH DAILY BEFORE BREAKFAST.   lisinopril 20 MG tablet Commonly known as: ZESTRIL Take 1 tablet (20 mg total) by mouth every morning.   metFORMIN 500 MG tablet Commonly known as: GLUCOPHAGE TAKE 1 TABLET (500 MG TOTAL) BY MOUTH 2 (TWO) TIMES DAILY WITH  A MEAL.   Ozempic (1 MG/DOSE) 4 MG/3ML Sopn Generic drug: Semaglutide (1 MG/DOSE) Inject 1 mg into the skin once a week.   tadalafil 20 MG tablet Commonly known as: CIALIS Take 1 tablet (20 mg total) by mouth daily as needed for erectile dysfunction.       Allergies:  Allergies  Allergen Reactions  . Other Other (See Comments)    BRAZILIAN NUTS ONLY-"THROAT CLOSES UP"    Family History: Family History  Problem Relation Age of Onset  . Hypertension Mother   . Cancer Father   . Diabetes Sister   . Thyroid cancer Sister     Social History:  reports that he has  never smoked. He has never used smokeless tobacco. He reports current alcohol use. He reports that he does not use drugs.   Physical Exam: BP (!) 172/91   Pulse 85   Ht _0  (1.702 m)   Wt 240 lb (108.9 kg)   BMI 37.59 kg/m   Constitutional:  Alert and oriented, No acute distress. HEENT: [place] AT, moist mucus membranes.  Trachea midline, no masses. Cardiovascular: No clubbing, cyanosis, or edema. Respiratory: Normal respiratory effort, no increased work of breathing.    Assessment & Plan:    1.  Erectile dysfunction  Has failed PDE 5 inhibitor therapy  I again reviewed second line options of vacuum erection devices and intracavernosal injections  He was provided literature and will call back if he desires to proceed with either of these options  Penile implant surgery was also discussed  He did request a refill of tadalafil   Abbie Sons, MD  [facility] [address], [place], [place] [address] [phone]

## 2020-04-13 NOTE — Telephone Encounter (Signed)
Requested Prescriptions  Pending Prescriptions Disp Refills  . lisinopril (ZESTRIL) 20 MG tablet [Pharmacy Med Name: LISINOPRIL 20 MG TABLET 20 Tablet] 90 tablet 1    Sig: TAKE 1 TABLET (20 MG TOTAL) BY MOUTH EVERY MORNING.     Cardiovascular:  ACE Inhibitors Passed - 04/13/2020  7:52 AM      Passed - Cr in normal range and within 180 days    Creat  Date Value Ref Range Status  02/10/2020 1.08 0.70 - 1.33 mg/dL Final    Comment:    For patients >12 years of age, the reference limit for Creatinine is approximately 13% higher for people identified as African-American. .          Passed - K in normal range and within 180 days    Potassium  Date Value Ref Range Status  02/10/2020 3.7 3.5 - 5.3 mmol/L Final         Passed - Patient is not pregnant      Passed - Last BP in normal range    BP Readings from Last 1 Encounters:  04/13/20 (!) 172/91         Passed - Valid encounter within last 6 months    Recent Outpatient Visits          3 weeks ago OSA on CPAP   [facility] [place], Netta Neat, DO   2 months ago OSA (obstructive sleep apnea)   [facility], Netta Neat, DO   6 months ago Pain and swelling of left lower leg   [facility] [place], Netta Neat, DO   6 months ago Sprain of lateral collateral ligament of left knee, initial encounter   [facility] [place], Netta Neat, DO   7 months ago Encounter for commercial driving license (CDL) exam   [facility], Jodelle Gross, FNP      Future Appointments            In 1 month Althea Charon, Netta Neat, DO [facility], [facility]   In 4 months [place], Jodelle Gross, FNP [facility], [facility]

## 2020-04-14 ENCOUNTER — Encounter: Payer: Self-pay | Admitting: Urology

## 2020-05-11 ENCOUNTER — Other Ambulatory Visit: Payer: Self-pay | Admitting: Family Medicine

## 2020-05-11 DIAGNOSIS — E039 Hypothyroidism, unspecified: Secondary | ICD-10-CM

## 2020-06-01 ENCOUNTER — Ambulatory Visit (INDEPENDENT_AMBULATORY_CARE_PROVIDER_SITE_OTHER): Payer: No Typology Code available for payment source | Admitting: Family Medicine

## 2020-06-01 ENCOUNTER — Encounter: Payer: Self-pay | Admitting: Family Medicine

## 2020-06-01 ENCOUNTER — Other Ambulatory Visit: Payer: Self-pay

## 2020-06-01 VITALS — BP 144/88 | HR 82 | Ht 67.5 in | Wt 243.6 lb

## 2020-06-01 DIAGNOSIS — E113293 Type 2 diabetes mellitus with mild nonproliferative diabetic retinopathy without macular edema, bilateral: Secondary | ICD-10-CM

## 2020-06-01 DIAGNOSIS — I10 Essential (primary) hypertension: Secondary | ICD-10-CM | POA: Diagnosis not present

## 2020-06-01 DIAGNOSIS — G4733 Obstructive sleep apnea (adult) (pediatric): Secondary | ICD-10-CM

## 2020-06-01 DIAGNOSIS — Z9989 Dependence on other enabling machines and devices: Secondary | ICD-10-CM

## 2020-06-01 LAB — POCT GLYCOSYLATED HEMOGLOBIN (HGB A1C): Hemoglobin A1C: 8.1 % — AB (ref 4.0–5.6)

## 2020-06-01 NOTE — Assessment & Plan Note (Signed)
A1c 8.1, stable from previous 8.0 With some hyperglycemia still reported on CBGs Without Hypoglycemia Complicated by DM retinopathy  Plan:  1. Keep on current ozempic 1mg  weekly - CONTINUE Metformin 500 BID 2. Encourage improved lifestyle - low carb, low sugar diet, reduce portion size, continue improving regular exercise 3. Check CBG, bring log to next visit for review

## 2020-06-01 NOTE — Patient Instructions (Addendum)
Thank you for coming to the office today.  Keep up the good work overall  Recent Labs    09/10/19 0908 02/10/20 0848 06/01/20 0844  HGBA1C 8.0* 8.0* 8.1*    Keep using CPAP  Will request copy of eye exam from Dr Ellin Mayhew  BP improved. Keep track of BP readings.  Please schedule a Follow-up Appointment to: Return in about 4 months (around 09/29/2020) for 4 month follow-up DM A1c, HTN, OSA.  If you have any other questions or concerns, please feel free to call the office or send a message through [place]. You may also schedule an earlier appointment if necessary.  Additionally, you may be receiving a survey about your experience at our office within a few days to 1 week by e-mail or mail. We value your feedback.  [name], DO [place]

## 2020-06-01 NOTE — Assessment & Plan Note (Signed)
Mildly elevated initial BP, repeat manual check improved but still elevated. - Home BP readings reviewed  Complication OSA on CPAP    Plan:  1. Continue current BP regimen - Diltiazem 420mg  daily, Lisinopril 20mg  daily, HCTZ 25mg  daily 2. Encourage improved lifestyle - low sodium diet, regular exercise 3. Continue monitor BP outside office, bring readings to next visit, if persistently >140/90 or new symptoms notify office sooner

## 2020-06-01 NOTE — Assessment & Plan Note (Signed)
Morbid obesity with BMI >37 Comorbid conditions type 2 diabetes, HLD, HTN OSA

## 2020-06-01 NOTE — Progress Notes (Signed)
Subjective:    Patient ID: Shane Frazier., male    DOB: [birth year], 55 y.o.   MRN: [id number]  Kerney Hopfensperger. is a 55 y.o. male presenting on 06/01/2020 for Diabetes   HPI   CHRONIC DM, Type 2: Morbid Obesity BMI >37 Improved overall. Last A1c had improved to 8, now due for repeat today CBG avg 130-140s Doing well on Ozempic CBGs:Home readingsimproved - ordered Freestyle Lite new testing supplies recently Meds:Ozempic 1mg  weekly injection,Metformin500mg  in AM and 1000mg  PM w meal Reports good compliance. 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 He just took meds not long before apt had mild elevated reading initially Current Meds -Lisinopril 20mg  daily, Diltiazem LA 420mg  daily, HCTZ 25mg  daily Reports good compliance, took meds today. Tolerating well, w/o complaints. Denies CP, dyspnea, HA, edema, dizziness / lightheadedness  OSA, on CPAP Background information : History ofwitnessed sleep apnea events overnight, stopped breathing. Admits snoring overnight loudly. Has some muscle jerk and jumps in sleep. Prior history excessive daytime sleepiness. - he has completed SleepMed sleep study series at [facility]. He has new CPAP from [facility] now. - He has been using CPAP regularly however difficulty with adherence due to often awakening, not adhering to high percentage of usage due to frequent awakenings. - He does benefit from using it. He feels less tired and sleepy during the day when he uses it consistently.   Health Maintenance: No COVID Vaccine.  Depression screen [facility] 2/9 09/10/2019 05/13/2019 03/25/2019  Decreased Interest 0 0 0  Down,  Depressed, Hopeless 0 0 0  PHQ - 2 Score 0 0 0    Social History   Tobacco Use  . Smoking status: Never Smoker  . Smokeless tobacco: Never Used  Vaping Use  . Vaping Use: Never used  Substance Use Topics  . Alcohol use: Yes    Comment: occasionally. rarely  . Drug use: No    Review of Systems Per HPI unless specifically indicated above     Objective:    BP (!) 144/88 (BP Location: Left Arm, Cuff Size: Normal)   Pulse 82   Ht 5' 7.5" (1.715 m)   Wt 243 lb 9.6 oz (110.5 kg)   SpO2 95%   BMI 37.59 kg/m   Wt Readings from Last 3 Encounters:  06/01/20 243 lb 9.6 oz (110.5 kg)  04/13/20 240 lb (108.9 kg)  03/23/20 245 lb 9.6 oz (111.4 kg)    Physical Exam Vitals and nursing note reviewed.  Constitutional:      General: He is not in acute distress.    Appearance: He is well-developed and well-nourished. He is obese. He is not diaphoretic.     Comments: Well-appearing, comfortable, cooperative  HENT:     Head: Normocephalic and atraumatic.     Mouth/Throat:     Mouth: Oropharynx is clear and moist.  Eyes:     General:        Right eye: No discharge.        Left eye: No discharge.     Conjunctiva/sclera: Conjunctivae normal.  Cardiovascular:     Rate and Rhythm: Normal rate.  Pulmonary:     Effort: Pulmonary effort is normal.  Musculoskeletal:  General: No edema.  Skin:    General: Skin is warm and dry.     Findings: No erythema or rash.  Neurological:     Mental Status: He is alert and oriented to person, place, and time.  Psychiatric:        Mood and Affect: Mood and affect normal.        Behavior: Behavior normal.     Comments: Well groomed, good eye contact, normal speech and thoughts      Diabetic Foot Exam - Simple   Simple Foot Form Diabetic Foot exam was performed with the following findings: Yes 06/01/2020  8:43 AM  Visual Inspection No deformities, no ulcerations, no other skin breakdown bilaterally: Yes Sensation Testing Intact to touch  and monofilament testing bilaterally: Yes Pulse Check Posterior Tibialis and Dorsalis pulse intact bilaterally: Yes Comments    Recent Labs    09/10/19 0908 02/10/20 0848 06/01/20 0844  HGBA1C 8.0* 8.0* 8.1*     Results for orders placed or performed in visit on 06/01/20  POCT HgB A1C  Result Value Ref Range   Hemoglobin A1C 8.1 (A) 4.0 - 5.6 %      Assessment & Plan:   Problem List Items Addressed This Visit    Type 2 diabetes mellitus with diabetic retinopathy ([place]) - Primary    A1c 8.1, stable from previous 8.0 With some hyperglycemia still reported on CBGs Without Hypoglycemia Complicated by DM retinopathy  Plan:  1. Keep on current ozempic 1mg  weekly - CONTINUE Metformin 500 BID 2. Encourage improved lifestyle - low carb, low sugar diet, reduce portion size, continue improving regular exercise 3. Check CBG, bring log to next visit for review      Relevant Orders   POCT HgB A1C (Completed)   OSA on CPAP    On CPAP [employer] [facility] Reviewed usage for past 30 days - did not meet criteria. He is benefiting from CPAP when he is using it.  Required to use the CPAP for >4 hours per night on 70% of nights during at least consecutive 30 day period (within first 3 months of starting CPAP)      Morbid obesity (HCC)    Morbid obesity with BMI >37 Comorbid conditions type 2 diabetes, HLD, HTN OSA      Essential hypertension    Mildly elevated initial BP, repeat manual check improved but still elevated. - Home BP readings reviewed  Complication OSA on CPAP    Plan:  1. Continue current BP regimen - Diltiazem 420mg  daily, Lisinopril 20mg  daily, HCTZ 25mg  daily 2. Encourage improved lifestyle - low sodium diet, regular exercise 3. Continue monitor BP outside office, bring readings to next visit, if persistently >140/90 or new symptoms notify office sooner         No orders of the defined types were placed in this encounter.    Follow up plan: Return in about 4  months (around 09/29/2020) for 4 month follow-up DM A1c, HTN, OSA.   [name], [facility] 06/01/2020, 8:36 AM

## 2020-06-01 NOTE — Assessment & Plan Note (Signed)
On CPAP [employer] [facility] Reviewed usage for past 30 days - did not meet criteria. He is benefiting from CPAP when he is using it.  Required to use the CPAP for >4 hours per night on 70% of nights during at least consecutive 30 day period (within first 3 months of starting CPAP)

## 2020-06-04 ENCOUNTER — Encounter: Payer: Self-pay | Admitting: Family Medicine

## 2020-07-06 ENCOUNTER — Other Ambulatory Visit: Payer: Self-pay | Admitting: Family Medicine

## 2020-07-06 DIAGNOSIS — E113293 Type 2 diabetes mellitus with mild nonproliferative diabetic retinopathy without macular edema, bilateral: Secondary | ICD-10-CM

## 2020-07-06 NOTE — Telephone Encounter (Signed)
Requested Prescriptions  Pending Prescriptions Disp Refills  . OZEMPIC, 1 MG/DOSE, 4 MG/76ML SOPN [Pharmacy Med Name: OZEMPIC (1 MG/DOSE) 4 MG/76M 4 Solution Pen-injector] 3 mL 1    Sig: INJECT 1 MG INTO THE SKIN ONCE A WEEK.     Endocrinology:  Diabetes - GLP-1 Receptor Agonists Failed - 07/06/2020  7:51 AM      Failed - HBA1C is between 0 and 7.9 and within 180 days    Hemoglobin A1C  Date Value Ref Range Status  06/01/2020 8.1 (A) 4.0 - 5.6 % Final  10/17/2017 9.8  Final   Hgb A1c MFr Bld  Date Value Ref Range Status  02/10/2020 8.0 (H) <5.7 % of total Hgb Final    Comment:    For someone without known diabetes, a hemoglobin A1c value of 6.5% or greater indicates that they may have  diabetes and this should be confirmed with a follow-up  test. . For someone with known diabetes, a value <7% indicates  that their diabetes is well controlled and a value  greater than or equal to 7% indicates suboptimal  control. A1c targets should be individualized based on  duration of diabetes, age, comorbid conditions, and  other considerations. . Currently, no consensus exists regarding use of hemoglobin A1c for diagnosis of diabetes for children. Renella Cunas - Valid encounter within last 6 months    Recent Outpatient Visits          1 month ago Type 2 diabetes mellitus with both eyes 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, without long-term current use of insulin [facility])   [place], DO   3 months ago OSA on CPAP   [place], DO   4 months ago OSA (obstructive sleep apnea)   [place], DO   9 months ago Pain and swelling of left lower leg   [place], DO   9 months ago Sprain of lateral collateral ligament of left knee, initial encounter   [facility] O[facility] Hauser, DO      Future Appointments            In 2 months Malfi, Lupita Raider, [facility], [place]   In 3 months [place], [facility], [facility]

## 2020-07-20 ENCOUNTER — Other Ambulatory Visit: Payer: Self-pay | Admitting: Family Medicine

## 2020-07-20 ENCOUNTER — Other Ambulatory Visit: Payer: Self-pay

## 2020-07-20 DIAGNOSIS — I10 Essential (primary) hypertension: Secondary | ICD-10-CM

## 2020-07-20 MED ORDER — DILTIAZEM HCL ER COATED BEADS 420 MG PO TB24
420.0000 mg | ORAL_TABLET | ORAL | 1 refills | Status: DC
  Filled 2020-07-20: qty 90, 90d supply, fill #0
  Filled 2020-11-14: qty 90, 90d supply, fill #1

## 2020-07-20 NOTE — Telephone Encounter (Signed)
Requested Prescriptions  Pending Prescriptions Disp Refills  . diltiazem (CARDIZEM LA) 420 MG 24 hr tablet 90 tablet 1    Sig: TAKE 1 TABLET (420 MG TOTAL) BY MOUTH EVERY MORNING.     Cardiovascular:  Calcium Channel Blockers Failed - 07/20/2020  8:38 AM      Failed - Last BP in normal range    BP Readings from Last 1 Encounters:  06/01/20 (!) 144/88         Passed - Valid encounter within last 6 months    Recent Outpatient Visits          1 month ago Type 2 diabetes mellitus with both eyes 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, without long-term current use of insulin [facility])   [place], DO   3 months ago OSA on CPAP   [place], DO   5 months ago OSA (obstructive sleep apnea)   [place], DO   9 months ago Pain and swelling of left lower leg   [place], DO   10 months ago Sprain of lateral collateral ligament of left knee, initial encounter   [place], Devonne Doughty, DO      Future Appointments            In 1 week Kathrine Haddock, NP [facility], [place]   In 2 months [place], [facility], [facility]

## 2020-07-21 ENCOUNTER — Other Ambulatory Visit: Payer: Self-pay

## 2020-07-28 ENCOUNTER — Other Ambulatory Visit: Payer: Self-pay

## 2020-07-28 ENCOUNTER — Ambulatory Visit: Payer: No Typology Code available for payment source | Admitting: Unknown Physician Specialty

## 2020-07-28 VITALS — BP 151/80 | HR 85 | Temp 97.8°F | Ht 67.5 in | Wt 247.0 lb

## 2020-07-28 DIAGNOSIS — Z024 Encounter for examination for driving license: Secondary | ICD-10-CM

## 2020-07-28 NOTE — Progress Notes (Signed)
BP (!) 151/80 (BP Location: Right Arm, Patient Position: Sitting, Cuff Size: Large)   Pulse 85   Temp 97.8 F (36.6 C) (Temporal)   Ht 5' 7.5" (1.715 m)   Wt 247 lb (112 kg)   BMI 38.11 kg/m    Subjective:    Patient ID: Shane Frazier., male    DOB: 09-Apr-1966, 55 y.o.   MRN: [id number]  HPI: Raheel Kunkle. is a 55 y.o. male  Chief Complaint  Patient presents with  . 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  He does have DM and sleep apnea.  His last Hgb A1C is 8.1 and not on insulin.  He has not worn his CPAP at night due to caregiving responsibilites  Relevant past medical, surgical, family and social history reviewed and updated as indicated. Interim medical history since our last visit reviewed. Allergies and medications reviewed and updated.  Review of Systems  Per HPI unless specifically indicated above     Objective:    BP (!) 151/80 (BP Location: Right Arm, Patient Position: Sitting, Cuff Size: Large)   Pulse 85   Temp 97.8 F (36.6 C) (Temporal)   Ht 5' 7.5" (1.715 m)   Wt 247 lb (112 kg)   BMI 38.11 kg/m   Wt Readings from Last 3 Encounters:  07/28/20 247 lb (112 kg)  06/01/20 243 lb 9.6 oz (110.5 kg)  04/13/20 240 lb (108.9 kg)    Physical Exam Constitutional:      General: He is not in acute distress.    Appearance: Normal appearance. He is well-developed.  HENT:     Head: Normocephalic and atraumatic.  Eyes:     General: Lids are normal. No scleral icterus.       Right eye: No discharge.        Left eye: No discharge.     Conjunctiva/sclera: Conjunctivae normal.  Neck:     Vascular: No carotid bruit or JVD.  Cardiovascular:     Rate and Rhythm: Normal rate and regular rhythm.     Heart sounds: Normal heart sounds.  Pulmonary:     Effort: Pulmonary effort is normal. No respiratory distress.     Breath sounds: Normal breath sounds.  Abdominal:     Palpations: There is no hepatomegaly or splenomegaly.   Musculoskeletal:        General: Normal range of motion.     Cervical back: Normal range of motion and neck supple.  Skin:    General: Skin is warm and dry.     Coloration: Skin is not pale.     Findings: No rash.  Neurological:     Mental Status: He is alert and oriented to person, place, and time.  Psychiatric:        Behavior: Behavior normal.        Thought Content: Thought content normal.        Judgment: Judgment normal.     Results for orders placed or performed in visit on 06/04/20  HM DIABETES EYE EXAM  Result Value Ref Range   HM Diabetic Eye Exam Retinopathy (A) No Retinopathy      Assessment & Plan:   Problem List Items Addressed This Visit   None   Visit Diagnoses    Encounter for Department of Transportation (DOT) examination for trucking license    -  Primary   OK for 30 days due to CPAP use.  I  can issue 90 days after demonstrating use for 30 days.  DM OK for 1 year       Follow up plan: Return in about 1 month (around 08/27/2020).

## 2020-08-04 ENCOUNTER — Encounter: Payer: No Typology Code available for payment source | Admitting: Unknown Physician Specialty

## 2020-08-10 ENCOUNTER — Other Ambulatory Visit: Payer: Self-pay

## 2020-08-10 ENCOUNTER — Other Ambulatory Visit: Payer: Self-pay | Admitting: Urology

## 2020-08-10 DIAGNOSIS — N5201 Erectile dysfunction due to arterial insufficiency: Secondary | ICD-10-CM

## 2020-08-10 MED ORDER — TADALAFIL 20 MG PO TABS: ORAL_TABLET | ORAL | 0 refills | Status: DC

## 2020-08-10 MED FILL — Semaglutide Soln Pen-inj 1 MG/DOSE (4 MG/3ML): SUBCUTANEOUS | 28 days supply | Qty: 3 | Fill #0 | Status: AC

## 2020-08-20 IMAGING — US US EXTREM LOW VENOUS*L*
1 series · 15 of 24 positions shown · non-contrast
Comparison: None.

CLINICAL DATA: Left lower extremity pain and edema. History of knee
injury 2 weeks ago. Evaluate for DVT.



[Series 1: us extrem low venous*left* · 15 of 32 slices shown]
[im 1/32]
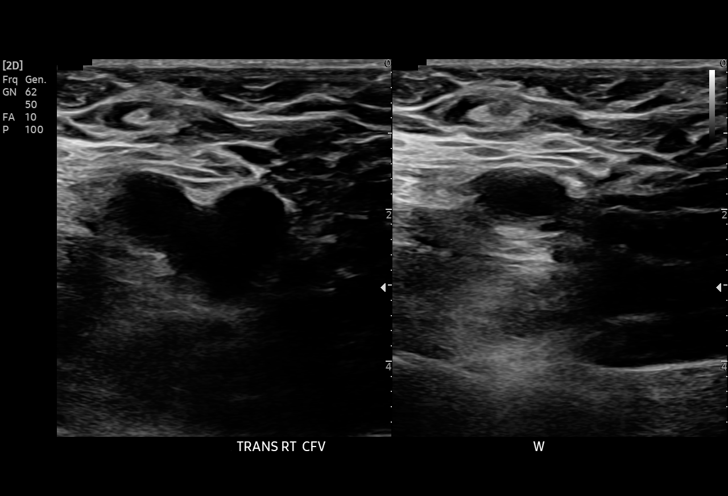
[im 3/32]
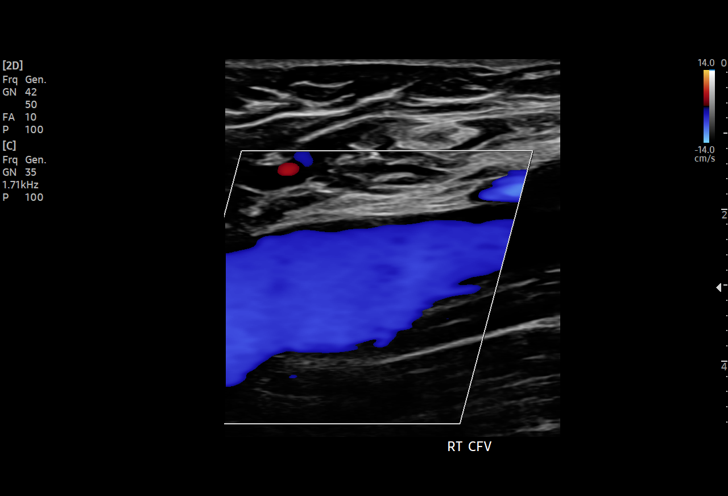
[im 6/32]
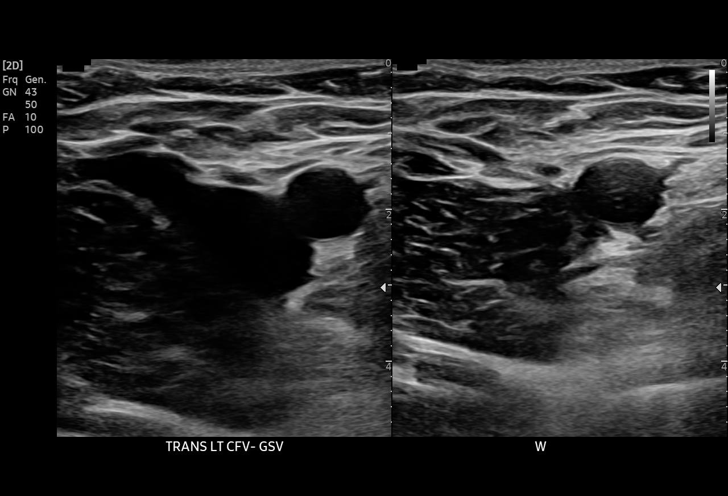
[im 7/32]
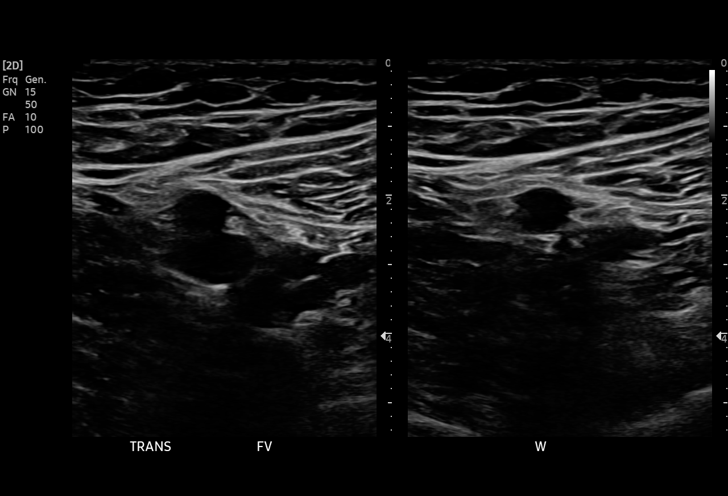
[im 10/32]
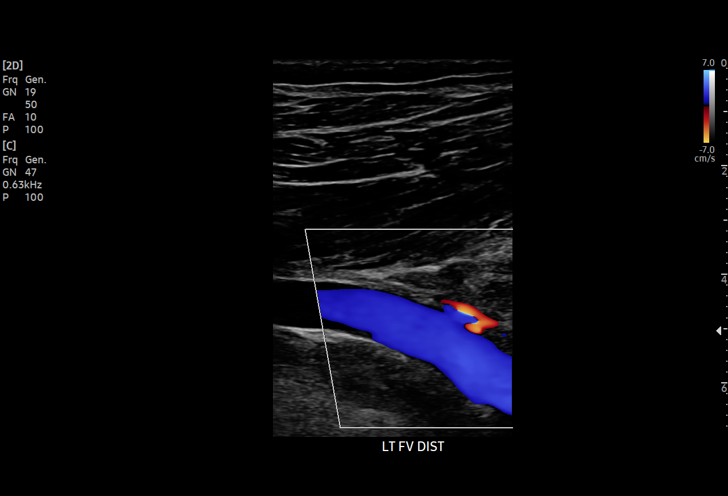
[im 11/32]
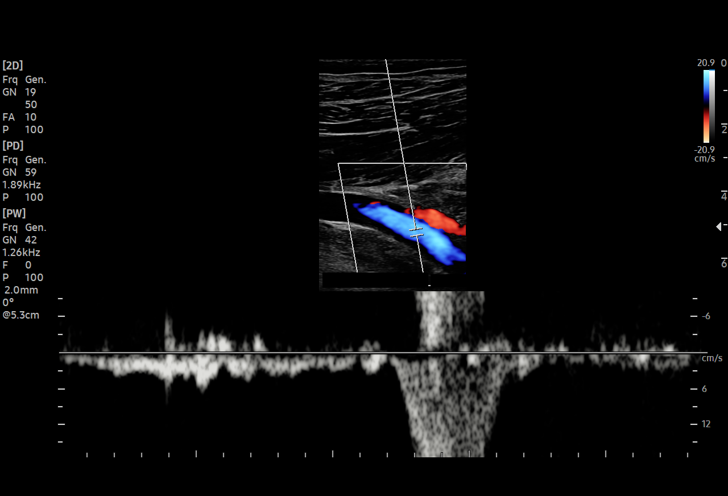
[im 14/32]
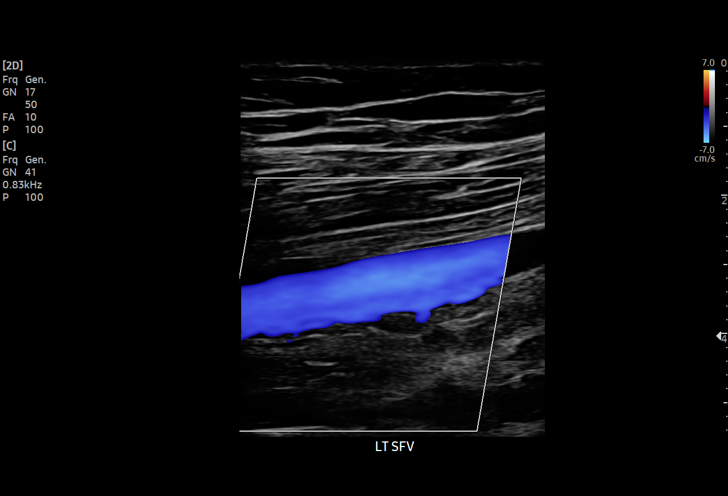
[im 17/32]
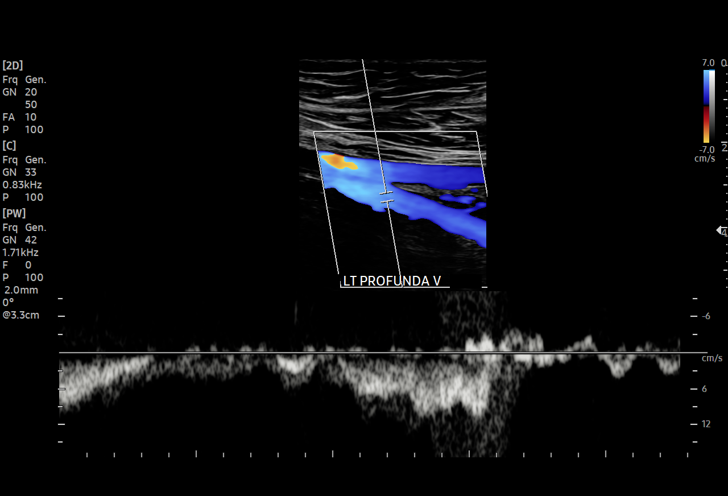
[im 18/32]
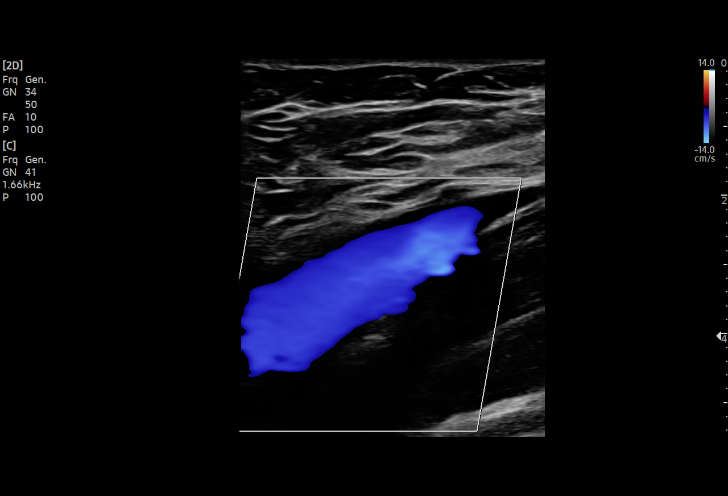
[im 21/32]
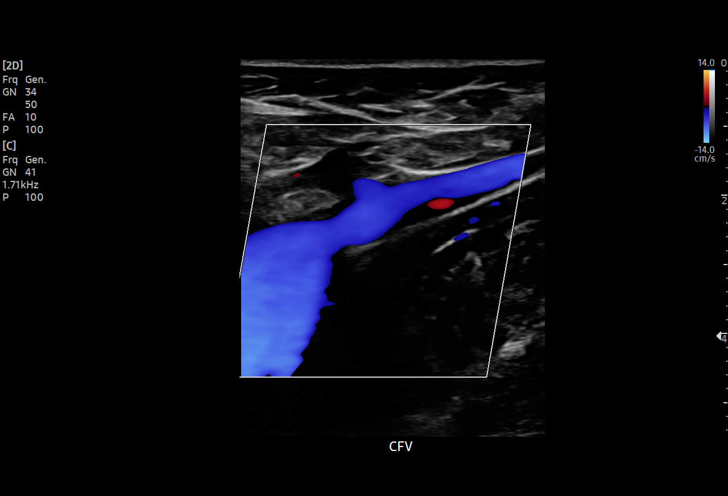
[im 22/32]
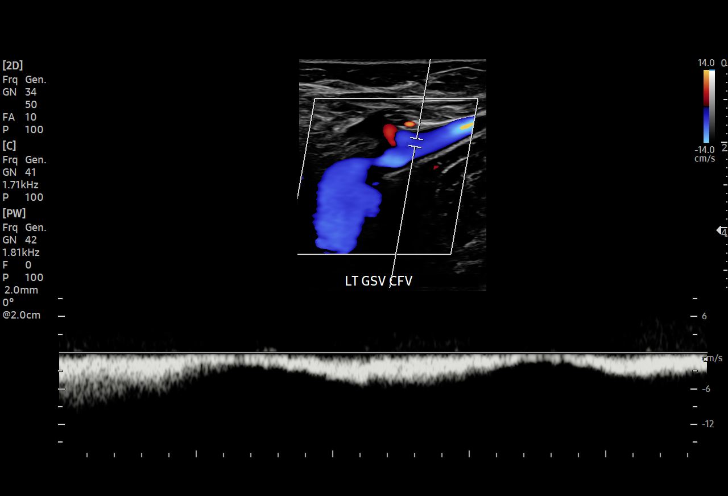
[im 25/32]
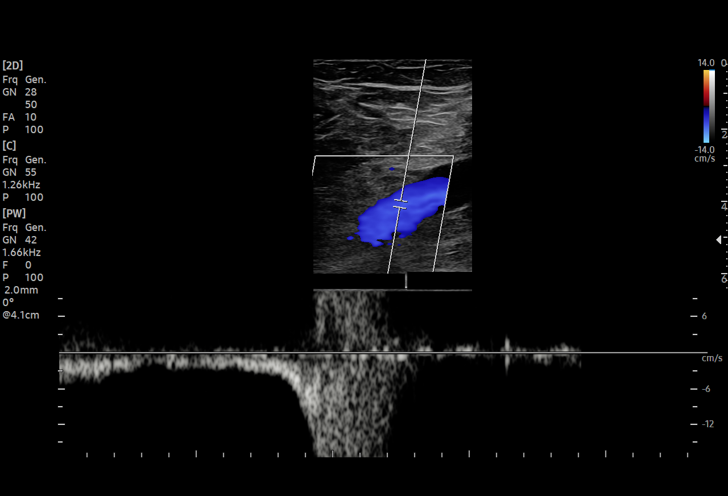
[im 27/32]
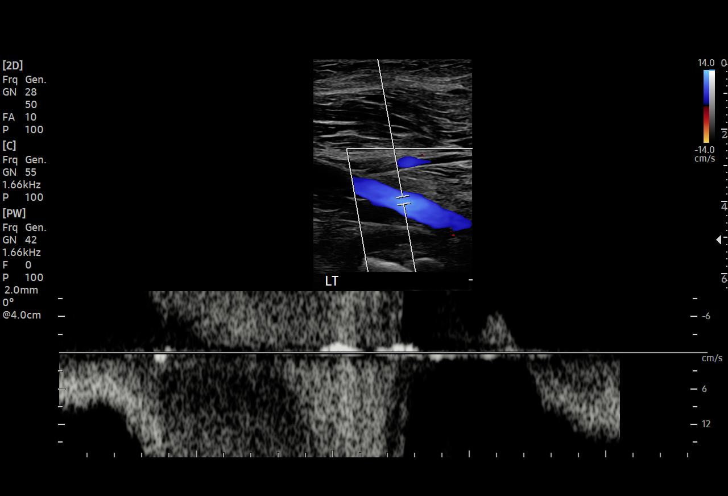
[im 29/32]
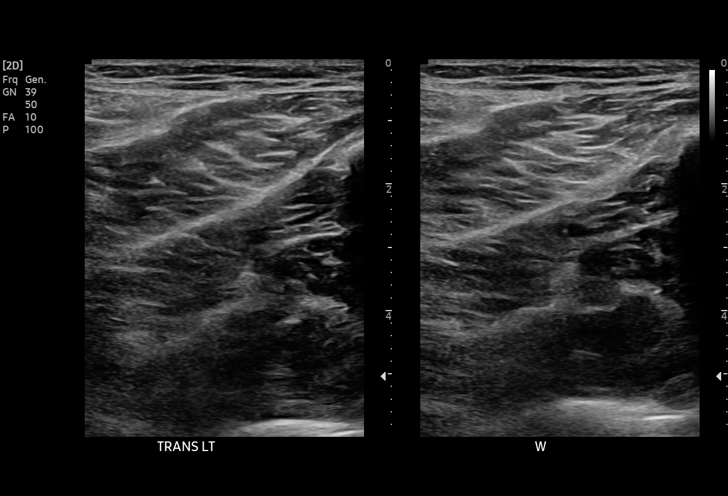
[im 32/32]
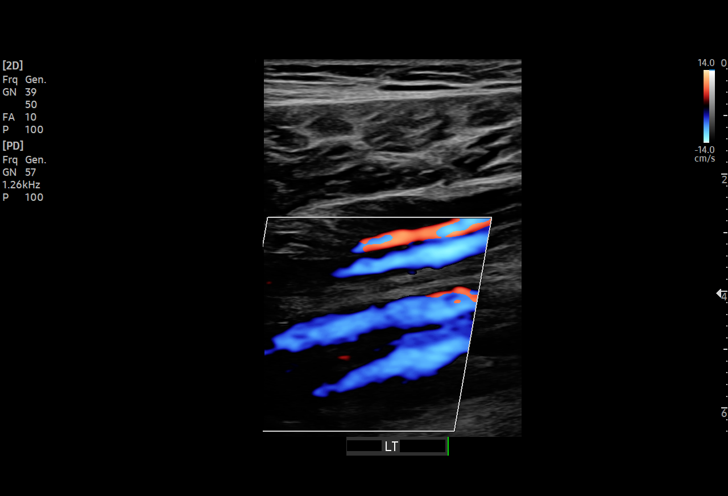

[15 of 24 positions shown; findings below may reference images not displayed]

FINDINGS: Contralateral Common Femoral Vein: Respiratory phasicity is normal
and symmetric with the symptomatic side. No evidence of thrombus.
Normal compressibility.

Common Femoral Vein: No evidence of thrombus. Normal
compressibility, respiratory phasicity and response to augmentation.

Saphenofemoral Junction: No evidence of thrombus. Normal
compressibility and flow on color Doppler imaging.

Profunda Femoral Vein: No evidence of thrombus. Normal
compressibility and flow on color Doppler imaging.

Femoral Vein: No evidence of thrombus. Normal compressibility,
respiratory phasicity and response to augmentation.

Popliteal Vein: No evidence of thrombus. Normal compressibility,
respiratory phasicity and response to augmentation.

Calf Veins: No evidence of thrombus. Normal compressibility and flow
on color Doppler imaging.

Superficial Great Saphenous Vein: No evidence of thrombus. Normal
compressibility.

Venous Reflux:  None.

Other Findings:  None.
IMPRESSION: No evidence of DVT within the left lower extremity.

## 2020-09-04 ENCOUNTER — Encounter: Payer: No Typology Code available for payment source | Admitting: Family Medicine

## 2020-09-10 ENCOUNTER — Other Ambulatory Visit: Payer: Self-pay

## 2020-09-10 ENCOUNTER — Other Ambulatory Visit (INDEPENDENT_AMBULATORY_CARE_PROVIDER_SITE_OTHER): Payer: No Typology Code available for payment source

## 2020-09-10 DIAGNOSIS — E113293 Type 2 diabetes mellitus with mild nonproliferative diabetic retinopathy without macular edema, bilateral: Secondary | ICD-10-CM | POA: Diagnosis not present

## 2020-09-10 LAB — POCT GLYCOSYLATED HEMOGLOBIN (HGB A1C): Hemoglobin A1C: 8.6 % — AB (ref 4.0–5.6)

## 2020-09-10 NOTE — Telephone Encounter (Signed)
Ok for POCT A1C 

## 2020-09-24 ENCOUNTER — Other Ambulatory Visit: Payer: Self-pay | Admitting: Family Medicine

## 2020-09-24 ENCOUNTER — Other Ambulatory Visit: Payer: Self-pay

## 2020-09-24 DIAGNOSIS — E113293 Type 2 diabetes mellitus with mild nonproliferative diabetic retinopathy without macular edema, bilateral: Secondary | ICD-10-CM

## 2020-09-24 MED FILL — Semaglutide Soln Pen-inj 1 MG/DOSE (4 MG/3ML): SUBCUTANEOUS | 28 days supply | Qty: 3 | Fill #0 | Status: AC

## 2020-09-25 ENCOUNTER — Other Ambulatory Visit: Payer: Self-pay

## 2020-09-25 MED FILL — Lisinopril Tab 20 MG: ORAL | 90 days supply | Qty: 90 | Fill #0 | Status: AC

## 2020-09-28 ENCOUNTER — Other Ambulatory Visit: Payer: Self-pay

## 2020-10-08 NOTE — Progress Notes (Signed)
[facility] - Clinic Note  10/12/2020     CHIEF COMPLAINT Patient presents for Retina Evaluation   HISTORY OF PRESENT ILLNESS: Shane L Sazama Jr. is a 54 y.o. male who presents to the clinic today for:   HPI     Retina Evaluation   In both eyes.  This started 7 months ago.  I, the attending physician,  performed the HPI with the patient and updated documentation appropriately.        Comments   Newpt ret eval referred by Dr. Woodard for CSR OS, Mac edema OS, NPDR OU. Pt states he had his annual eye exam in Nov/Dec 2021 with Dr. Woodard. He reports that a few weeks ago he noticed vision change in OS. Reports no vision changes in OD that hes noticed. BS yesterday was 141, most recent A1C 8.6      Last edited by Zamora, Brian, MD on 10/13/2020 10:57 PM.    Pt is here on the referral of Dr. Woodard for concern of CSR / DME OS, pt saw Dr. Woodard at the end of last year and vision was good, he reports a decrease in OS vision recently, but is unsure exactly when it started, pt states medication for diabetes and HTN, but is unaware of any recent spikes in BP  Referring physician: Woodard, Robert, OD [address] [place],  [place] [id number]   HISTORICAL INFORMATION:   Selected notes from the medical record:  Referred by Dr. Woodard LEE: 06.22.2022 Ocular Hx- NPDR OU, Mac edema OS, CSR OS BCVA OD 20/20, OS 20/40    CURRENT MEDICATIONS: No current outpatient medications on file. (Ophthalmic Drugs)   No current facility-administered medications for this visit. (Ophthalmic Drugs)   Current Outpatient Medications (Other)  Medication Sig   atorvastatin (LIPITOR) 20 MG tablet Take 1 tablet (20 mg total) by mouth at bedtime.   Blood Glucose Calibration (FREESTYLE CONTROL SOLUTION) LIQD Use to calibrate glucometer to check blood sugar   Blood Glucose Monitoring Suppl (FREESTYLE FREEDOM LITE) w/Device KIT USE TO CHECK BLOOD SUGAR UP TO 2 TIMES DAILY   Blood Glucose  Monitoring Suppl (FREESTYLE LITE) DEVI Use to check blood sugar up to 2 x daily   diclofenac Sodium (VOLTAREN) 1 % GEL Apply 2 g topically 3 (three) times daily as needed (left knee pain arthritis).   diltiazem (CARDIZEM LA) 420 MG 24 hr tablet TAKE 1 TABLET (420 MG TOTAL) BY MOUTH EVERY MORNING.   FREESTYLE LITE test strip USE TO CHECK BLOOD SUGAR UP TO 2 TIMES DAILY (Patient taking differently: USE TO CHECK BLOOD SUGAR UP TO 2 TIMES DAILY)   hydrochlorothiazide (HYDRODIURIL) 25 MG tablet Take 1 tablet (25 mg total) by mouth daily.   Lancets (FREESTYLE) lancets CHECK BLOOD SUGAR UP TO 2 TIMES DAILY (Patient taking differently: CHECK BLOOD SUGAR UP TO 2 TIMES DAILY)   levothyroxine (SYNTHROID) 200 MCG tablet TAKE 1 TABLET BY MOUTH DAILY BEFORE BREAKFAST.   lisinopril (ZESTRIL) 20 MG tablet TAKE 1 TABLET BY MOUTH EVERY MORNING.   metFORMIN (GLUCOPHAGE) 500 MG tablet TAKE 1 TABLET (500 MG TOTAL) BY MOUTH 2 (TWO) TIMES DAILY WITH A MEAL.   OZEMPIC, 1 MG/DOSE, 4 MG/3ML SOPN INJECT 1 MG INTO THE SKIN ONCE A WEEK.   tadalafil (CIALIS) 20 MG tablet 1 tab po 1 hour prior to intercourse   No current facility-administered medications for this visit. (Other)      REVIEW OF SYSTEMS: ROS   Positive for:   Musculoskeletal, Endocrine, Cardiovascular Last edited by Simpson, Makenzie E, COT on 10/12/2020  8:17 AM.       ALLERGIES Allergies  Allergen Reactions   Other Other (See Comments)    BRAZILIAN NUTS ONLY-"THROAT CLOSES UP"    PAST MEDICAL HISTORY Past Medical History:  Diagnosis Date   CHF (congestive heart failure), NYHA class I, acute on chronic, systolic (HCC)    Concussion 2017   cause of seizure after hit head at work   Difficult intubation    patient unaware of this diagnosis   ED (erectile dysfunction)    Hypothyroidism    Seizures (HCC) 05-20-15   X 1-PT HIT HIS HEAD AT WORK AND THEN HAD A SEIZURE   Past Surgical History:  Procedure Laterality Date   COLONOSCOPY WITH PROPOFOL  N/A 12/07/2018   Procedure: COLONOSCOPY WITH PROPOFOL;  Surgeon: Anna, Kiran, MD;  Location: [facility];  Service: Gastroenterology;  Laterality: N/A;   FLEXIBLE BRONCHOSCOPY N/A 03/27/2018   Procedure: FLEXIBLE BRONCHOSCOPY With [employer] With C-arm;  Surgeon: Simonds, David B, MD;  Location: [facility] OR[facility];  Service: Pulmonary;  Laterality: N/A;   HERNIA REPAIR     as a child,umbilical   SHOULDER ARTHROSCOPY WITH ROTATOR CUFF REPAIR AND SUBACROMIAL DECOMPRESSION Left 09/09/2015   Procedure: SHOULDER ARTHROSCOPY, SUBACROMIAL DECOMPRESSION,BICEPS TENSON RELEASE AND MINI OPEN ROTATOR CUFF REPAIR;  Surgeon: Harold Kernodle, MD;  Location: [facility] ORS;  Service: Orthopedics;  Laterality: Left;   VENTRAL HERNIA REPAIR N/A 10/27/2017   Procedure: HERNIA REPAIR VENTRAL ADULT;  Surgeon: Pabon, Diego F, MD;  Location: [facility] OR[facility];  Service: General;  Laterality: N/A;    FAMILY HISTORY Family History  Problem Relation Age of Onset   Hypertension Mother    Cancer Father    Diabetes Sister    Thyroid cancer Sister     SOCIAL HISTORY Social History   Tobacco Use   Smoking status: Never   Smokeless tobacco: Never  Vaping Use   Vaping Use: Never used  Substance Use Topics   Alcohol use: Yes    Comment: occasionally. rarely   Drug use: No         OPHTHALMIC EXAM:  Base Eye Exam     Visual Acuity (Snellen - Linear)       Right Left   Dist cc 20/20 20/30   Dist ph cc  NI    Correction: Glasses         Tonometry (Tonopen, 8:30 AM)       Right Left   Pressure 18 18         Pupils       Dark Light Shape React APD   Right 4 3 Round Minimal None   Left 4 3 Round Minimal None         Visual Fields (Counting fingers)       Left Right    Full Full         Extraocular Movement       Right Left    Full, Ortho Full, Ortho         Neuro/Psych     Oriented x3: Yes   Mood/Affect: Normal         Dilation     Both eyes: 1.0% Mydriacyl, 2.5% Phenylephrine @ 8:31 AM            Slit Lamp and Fundus Exam     Slit Lamp Exam       Right Left   Lids/Lashes Dermatochalasis - upper lid   Dermatochalasis - upper lid   Conjunctiva/Sclera nasal and temporal pinguecula nasal and temporal pinguecula   Cornea arcus arcus   Anterior Chamber Deep and quiet Deep and quiet   Iris Round and dilated, No NVI Round and dilated, No NVI   Lens 2+ Nuclear sclerosis, 2+ Cortical cataract 2+ Nuclear sclerosis, 2+ Cortical cataract   Vitreous Vitreous syneresis Vitreous syneresis         Fundus Exam       Right Left   Disc Pink and Sharp Pink and Sharp, +cupping   C/D Ratio 0.6 0.6   Macula Flat, Good foveal reflex, scattered Microaneurysms, focal Exudates IT, trace cystic changes non-central Blunted foveal reflex, +edema centrally and IN mac, MA, large CWS/RAM along IT arteriorle   Vessels attenuated, Tortuous attenuated, Tortuous, Copper wiring, +RAM IN macula, severe attenuation temporal periphery   Periphery Attached, scattered MA/DBH    Attached              Refraction     Wearing Rx       Sphere Cylinder Axis   Right -2.25 +1.00 077   Left -2.00 +1.00 121         Manifest Refraction       Sphere Cylinder Axis Dist VA   Right -2.25 +1.00 077 20/20   Left -2.25 +1.00 120 20/30+2            IMAGING AND PROCEDURES  Imaging and Procedures for 10/12/2020  OCT, Retina - OU - Both Eyes       Right Eye Quality was good. Central Foveal Thickness: 271. Progression has no prior data. Findings include normal foveal contour, no IRF, no SRF, intraretinal hyper-reflective material, vitreomacular adhesion .   Left Eye Quality was good. Central Foveal Thickness: 501. Progression has no prior data. Findings include abnormal foveal contour, intraretinal hyper-reflective material, intraretinal fluid, subretinal fluid (Focal edema nasal macula and centrally).   Notes *Images captured and stored on drive  Diagnosis / Impression:  OD: NFP, no  IRF/SRF OS: +DME -- Focal edema nasal macula and centrally  Clinical management:  See below  Abbreviations: NFP - Normal foveal profile. CME - cystoid macular edema. PED - pigment epithelial detachment. IRF - intraretinal fluid. SRF - subretinal fluid. EZ - ellipsoid zone. ERM - epiretinal membrane. ORA - outer retinal atrophy. ORT - outer retinal tubulation. SRHM - subretinal hyper-reflective material. IRHM - intraretinal hyper-reflective material      Fluorescein Angiography Optos (Transit OS)       Right Eye Progression has no prior data. Early phase findings include vascular perfusion defect, microaneurysm. Mid/Late phase findings include microaneurysm, vascular perfusion defect (Focal vascular perfusion defect temporal periphery, late leaking MA).   Left Eye Progression has no prior data. Early phase findings include microaneurysm, blockage. Mid/Late phase findings include microaneurysm, leakage, blockage (Focal blockage along IT arteriole with surrounding leakage -- macroaneurysm).   Notes **Images stored on drive**  Impression: Moderate NPDR with DME OU OS: focal RAM along IT arteriole with blockage and leakage      Intravitreal Injection, Pharmacologic Agent - OS - Left Eye       Time Out 10/12/2020. 9:48 AM. Confirmed correct patient, procedure, site, and patient consented.   Anesthesia Topical anesthesia was used. Anesthetic medications included Lidocaine 2%, Proparacaine 0.5%.   Procedure Preparation included 5% betadine to ocular surface, eyelid speculum. A supplied needle was used.   Injection: 1.25 mg Bevacizumab 1.25mg/0.05ml   Route: Intravitreal, Site: Left Eye   NDC:   50242-060-01, Lot: [id number], Expiration date: 11/25/2020, Waste: 0 mL   Post-op Post injection exam found visual acuity of at least counting fingers. The patient tolerated the procedure well. There were no complications. The patient received written and verbal post procedure care  education.              ASSESSMENT/PLAN:    ICD-10-CM   1. Moderate nonproliferative diabetic retinopathy of both eyes with macular edema associated with type 2 diabetes mellitus (HCC)  E11.3313 Intravitreal Injection, Pharmacologic Agent - OS - Left Eye    Bevacizumab (AVASTIN) SOLN 1.25 mg    2. Retinal edema  H35.81 OCT, Retina - OU - Both Eyes    3. Retinal macroaneurysm, left eye  H35.012 Intravitreal Injection, Pharmacologic Agent - OS - Left Eye    Bevacizumab (AVASTIN) SOLN 1.25 mg    4. Essential hypertension  I10     5. Hypertensive retinopathy of both eyes  H35.033 Fluorescein Angiography Optos (Transit OS)    6. Combined forms of age-related cataract of both eyes  H25.813       1,2. Moderate non-proliferative diabetic retinopathy, both eyes - The incidence, risk factors for progression, natural history and treatment options for diabetic retinopathy were discussed with patient.   - The need for close monitoring of blood glucose, blood pressure, and serum lipids, avoiding cigarette or any type of tobacco, and the need for long term follow up was also discussed with patient. - FA (06.27.22) shows leaking MA; no NV OU focal RAM along IT arteriole with blockage and leakage  - OCT shows diabetic macular edema OS - The natural history, pathology, and characteristics of diabetic macular edema discussed with patient.  A generalized discussion of the major clinical trials concerning treatment of diabetic macular edema (ETDRS, DCT, SCORE, RISE / RIDE, and ongoing DRCR net studies) was completed.  This discussion included mention of the various approaches to treating diabetic macular edema (observation, laser photocoagulation, anti-VEGF injections with lucentis / Avastin / Eylea, steroid injections with Kenalog / Ozurdex, and intraocular surgery with vitrectomy).  The goal hemoglobin A1C of 6-7 was discussed, as well as importance of smoking cessation and hypertension control.  Need  for ongoing treatment and monitoring were specifically discussed with reference to chronic nature of diabetic macular edema. - BCVA 20/20 OD, 20/30 OS - recommend IVA #1 OS today, 06.27.22 for DME - pt wishes to proceed with injection - RBA of procedure discussed, questions answered - informed consent obtained and signed - see procedure note - f/u in 4 wks -- DFE/OCT, possible injection  3. Retinal arterial macroaneurysm w/ macular edema OS  - recommend IVA OS #1 today as above   4,5. Hypertensive retinopathy OU - discussed importance of tight BP control - monitor  6. Mixed Cataract OU - The symptoms of cataract, surgical options, and treatments and risks were discussed with patient. - discussed diagnosis and progression - not yet visually significant - monitor for now  Ophthalmic Meds Ordered this visit:  Meds ordered this encounter  Medications   Bevacizumab (AVASTIN) SOLN 1.25 mg        Return in about 4 weeks (around 11/09/2020) for f/u NPDR OU, DFE, OCT.  There are no Patient Instructions on file for this visit.   Explained the diagnoses, plan, and follow up with the patient and they expressed understanding.  Patient expressed understanding of the importance of proper follow up care.   This document serves as a record of services personally performed by   Brian G. Zamora, MD, PhD. It was created on their behalf by Robin Hodges, an ophthalmic technician. The creation of this record is the provider's dictation and/or activities during the visit.    Electronically signed by: Robin Hodges COA, 10/13/20  11:05 PM   Brian G. Zamora, M.D., Ph.D. Diseases & Surgery of the Retina and Vitreous [facility] 10/12/2020  I have reviewed the above documentation for accuracy and completeness, and I agree with the above. Brian G. Zamora, M.D., Ph.D. 10/13/20 11:11 PM   Abbreviations: M myopia (nearsighted); A astigmatism; H hyperopia (farsighted); P  presbyopia; Mrx spectacle prescription;  CTL contact lenses; OD right eye; OS left eye; OU both eyes  XT exotropia; ET esotropia; PEK punctate epithelial keratitis; PEE punctate epithelial erosions; DES dry eye syndrome; MGD meibomian gland dysfunction; ATs artificial tears; PFAT's preservative free artificial tears; NSC nuclear sclerotic cataract; PSC posterior subcapsular cataract; ERM epi-retinal membrane; PVD posterior vitreous detachment; RD retinal detachment; DM diabetes mellitus; DR diabetic retinopathy; NPDR non-proliferative diabetic retinopathy; PDR proliferative diabetic retinopathy; CSME clinically significant macular edema; DME diabetic macular edema; dbh dot blot hemorrhages; CWS cotton wool spot; POAG primary open angle glaucoma; C/D cup-to-disc ratio; HVF humphrey visual field; GVF goldmann visual field; OCT optical coherence tomography; IOP intraocular pressure; BRVO Branch retinal vein occlusion; CRVO central retinal vein occlusion; CRAO central retinal artery occlusion; BRAO branch retinal artery occlusion; RT retinal tear; SB scleral buckle; PPV pars plana vitrectomy; VH Vitreous hemorrhage; PRP panretinal laser photocoagulation; IVK intravitreal kenalog; VMT vitreomacular traction; MH Macular hole;  NVD neovascularization of the disc; NVE neovascularization elsewhere; AREDS age related eye disease study; ARMD age related macular degeneration; POAG primary open angle glaucoma; EBMD epithelial/anterior basement membrane dystrophy; ACIOL anterior chamber intraocular lens; IOL intraocular lens; PCIOL posterior chamber intraocular lens; Phaco/IOL phacoemulsification with intraocular lens placement; PRK photorefractive keratectomy; LASIK laser assisted in situ keratomileusis; HTN hypertension; DM diabetes mellitus; COPD chronic obstructive pulmonary disease  

## 2020-10-12 ENCOUNTER — Ambulatory Visit (INDEPENDENT_AMBULATORY_CARE_PROVIDER_SITE_OTHER): Payer: BLUE CROSS/BLUE SHIELD | Admitting: Ophthalmology

## 2020-10-12 ENCOUNTER — Other Ambulatory Visit: Payer: Self-pay

## 2020-10-12 ENCOUNTER — Ambulatory Visit: Payer: No Typology Code available for payment source | Admitting: Family Medicine

## 2020-10-12 ENCOUNTER — Ambulatory Visit (INDEPENDENT_AMBULATORY_CARE_PROVIDER_SITE_OTHER): Payer: No Typology Code available for payment source | Admitting: Family Medicine

## 2020-10-12 ENCOUNTER — Encounter: Payer: Self-pay | Admitting: Family Medicine

## 2020-10-12 ENCOUNTER — Other Ambulatory Visit: Payer: Self-pay | Admitting: Family Medicine

## 2020-10-12 VITALS — BP 147/90 | HR 56

## 2020-10-12 VITALS — BP 141/78 | HR 78 | Ht 67.5 in | Wt 245.2 lb

## 2020-10-12 DIAGNOSIS — R351 Nocturia: Secondary | ICD-10-CM

## 2020-10-12 DIAGNOSIS — E113293 Type 2 diabetes mellitus with mild nonproliferative diabetic retinopathy without macular edema, bilateral: Secondary | ICD-10-CM | POA: Diagnosis not present

## 2020-10-12 DIAGNOSIS — Z9989 Dependence on other enabling machines and devices: Secondary | ICD-10-CM

## 2020-10-12 DIAGNOSIS — I10 Essential (primary) hypertension: Secondary | ICD-10-CM | POA: Diagnosis not present

## 2020-10-12 DIAGNOSIS — E785 Hyperlipidemia, unspecified: Secondary | ICD-10-CM | POA: Diagnosis not present

## 2020-10-12 DIAGNOSIS — H3581 Retinal edema: Secondary | ICD-10-CM | POA: Diagnosis not present

## 2020-10-12 DIAGNOSIS — E113313 Type 2 diabetes mellitus with moderate nonproliferative diabetic retinopathy with macular edema, bilateral: Secondary | ICD-10-CM | POA: Diagnosis not present

## 2020-10-12 DIAGNOSIS — H35012 Changes in retinal vascular appearance, left eye: Secondary | ICD-10-CM | POA: Diagnosis not present

## 2020-10-12 DIAGNOSIS — Z0000 Encounter for general adult medical examination without abnormal findings: Secondary | ICD-10-CM

## 2020-10-12 DIAGNOSIS — E1169 Type 2 diabetes mellitus with other specified complication: Secondary | ICD-10-CM

## 2020-10-12 DIAGNOSIS — H35033 Hypertensive retinopathy, bilateral: Secondary | ICD-10-CM

## 2020-10-12 DIAGNOSIS — H25813 Combined forms of age-related cataract, bilateral: Secondary | ICD-10-CM

## 2020-10-12 DIAGNOSIS — G4733 Obstructive sleep apnea (adult) (pediatric): Secondary | ICD-10-CM

## 2020-10-12 DIAGNOSIS — E039 Hypothyroidism, unspecified: Secondary | ICD-10-CM

## 2020-10-12 MED ORDER — METFORMIN HCL 500 MG PO TABS
ORAL_TABLET | ORAL | 3 refills | Status: AC
  Filled 2020-10-12: qty 180, 90d supply, fill #0

## 2020-10-12 MED ORDER — ATORVASTATIN CALCIUM 20 MG PO TABS
20.0000 mg | ORAL_TABLET | Freq: Every day | ORAL | 3 refills | Status: DC
Start: 2020-10-12 — End: 2022-02-07
  Filled 2020-10-12: qty 90, 90d supply, fill #0

## 2020-10-12 NOTE — Assessment & Plan Note (Signed)
Elevated LDL previously Due for labs next visit  Plan: 1. Continue current meds - Atorvastatin 20mg  daily refill 2. Encourage improved lifestyle - low carb/cholesterol, reduce portion size, continue improving regular exercise

## 2020-10-12 NOTE — Assessment & Plan Note (Signed)
Mildly elevated initial BP Goal to check home readings. Complication OSA on CPAP    Plan:  1. Continue current BP regimen - Diltiazem 420mg  daily, Lisinopril 20mg  daily, HCTZ 25mg  daily 2. Encourage improved lifestyle - low sodium diet, regular exercise 3. Continue monitor BP outside office, bring readings to next visit, if persistently >140/90 or new symptoms notify office sooner

## 2020-10-12 NOTE — Progress Notes (Signed)
Subjective:    Patient ID: Shane [place]., male    DOB: [birth year], 55 y.o.   MRN: [id number]  Shane Nauert. is a 55 y.o. male presenting on 10/12/2020 for Diabetes and Hypertension   HPI  CHRONIC DM, Type 2: Morbid Obesity BMI >37 Hyperlipidemia Improved overall. Last A1c had improved to 8, now due for repeat today CBG avg 130-180, avg 145s, about 3 x week. Doing well on Ozempic CBGs: Home readings improved - ordered Freestyle Lite new testing supplies recently Meds: Ozempic 1mg  weekly injection, Metformin 500mg  BID Reports good compliance. 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 Denies hypoglycemia   CHRONIC HTN: Mild elevated, not always taking medication regularly. Current Meds - Lisinopril 20mg  daily, Diltiazem LA 420mg  daily, HCTZ 25mg  daily Reports good compliance, took meds today. Tolerating well, w/o complaints. Denies CP, dyspnea, HA, edema, dizziness / lightheadedness   OSA, on CPAP Background information : History of witnessed sleep apnea events overnight, stopped breathing. Admits snoring overnight loudly. Has some muscle jerk and jumps in sleep. Prior history excessive daytime sleepiness. - he has completed Sleep[facility] sleep study series at [facility]. He has new CPAP from [place] now. - He has been using CPAP regularly however difficulty with adherence due to often awakening, not adhering to high percentage of usage due to frequent awakenings. - He does benefit from using it. He feels less tired and sleepy during the day when he uses it consistently.    Depression screen [facility] 2/9 10/12/2020 09/10/2019 05/13/2019  Decreased Interest 0 0 0  Down, Depressed, Hopeless 0 0 0   PHQ - 2 Score 0 0 0    Social History   Tobacco Use   Smoking status: Never   Smokeless tobacco: Never  Vaping Use   Vaping Use: Never used  Substance Use Topics   Alcohol use: Yes    Comment: occasionally. rarely   Drug use: No    Review of Systems Per HPI unless specifically indicated above     Objective:    BP (!) 141/78   Pulse 78   Ht 5' 7.5" (1.715 m)   Wt 245 lb 3.2 oz (111.2 kg)   SpO2 98%   BMI 37.84 kg/m   Wt Readings from Last 3 Encounters:  10/12/20 245 lb 3.2 oz (111.2 kg)  07/28/20 247 lb (112 kg)  06/01/20 243 lb 9.6 oz (110.5 kg)    Physical Exam Vitals and nursing note reviewed.  Constitutional:      General: He is not in acute distress.    Appearance: He is well-developed. He is not diaphoretic.     Comments: Well-appearing, comfortable, cooperative  HENT:     Head: Normocephalic and atraumatic.  Eyes:     General:        Right eye: No discharge.        Left eye: No discharge.     Conjunctiva/sclera: Conjunctivae normal.  Neck:     Thyroid: No thyromegaly.  Cardiovascular:     Rate and Rhythm: Normal rate and regular rhythm.     Pulses: Normal pulses.     Heart sounds: Normal heart sounds. No murmur heard. Pulmonary:  Effort: Pulmonary effort is normal. No respiratory distress.     Breath sounds: Normal breath sounds. No wheezing or rales.  Musculoskeletal:        General: Normal range of motion.     Cervical back: Normal range of motion and neck supple.  Lymphadenopathy:     Cervical: No cervical adenopathy.  Skin:    General: Skin is warm and dry.     Findings: No erythema or rash.  Neurological:     Mental Status: He is alert and oriented to person, place, and time. Mental status is at baseline.  Psychiatric:        Behavior: Behavior normal.     Comments: Well groomed, good eye contact, normal speech and thoughts      Results for orders placed or performed in visit on 09/10/20  POCT glycosylated hemoglobin (Hb A1C)   Result Value Ref Range   Hemoglobin A1C 8.6 (A) 4.0 - 5.6 %   HbA1c POC (<> result, manual entry)     HbA1c, POC (prediabetic range)     HbA1c, POC (controlled diabetic range)        Assessment & Plan:   Problem List Items Addressed This Visit     Type 2 diabetes mellitus with diabetic retinopathy ([place])    Elevated A1c up to 8.6 from prior 8 to 8.1 range With some hyperglycemia still reported on CBGs Without Hypoglycemia Complicated by DM retinopathy  Plan:  1. Keep on current ozempic 1mg  weekly - offered dose inc to ozempic 2mg  reconsider 2. RESTART and Adhere to Metformin 500mg  BID, dose inc in future if indicated 2. Encourage improved lifestyle - low carb, low sugar diet, reduce portion size, continue improving regular exercise 3. Check CBG, bring log to next visit for review       Relevant Medications   metFORMIN (GLUCOPHAGE) 500 MG tablet   atorvastatin (LIPITOR) 20 MG tablet   Hyperlipidemia associated with type 2 diabetes mellitus (HCC)    Elevated LDL previously Due for labs next visit  Plan: 1. Continue current meds - Atorvastatin 20mg  daily refill 2. Encourage improved lifestyle - low carb/cholesterol, reduce portion size, continue improving regular exercise       Relevant Medications   metFORMIN (GLUCOPHAGE) 500 MG tablet   atorvastatin (LIPITOR) 20 MG tablet   Essential hypertension - Primary    Mildly elevated initial BP Goal to check home readings. Complication OSA on CPAP    Plan:  1. Continue current BP regimen - Diltiazem 420mg  daily, Lisinopril 20mg  daily, HCTZ 25mg  daily 2. Encourage improved lifestyle - low sodium diet, regular exercise 3. Continue monitor BP outside office, bring readings to next visit, if persistently >140/90 or new symptoms notify office sooner       Relevant Medications   atorvastatin (LIPITOR) 20 MG tablet    Meds ordered this encounter  Medications   metFORMIN (GLUCOPHAGE) 500 MG tablet    Sig: TAKE 1 TABLET (500  MG TOTAL) BY MOUTH 2 (TWO) TIMES DAILY WITH A MEAL.    Dispense:  180 tablet    Refill:  3   atorvastatin (LIPITOR) 20 MG tablet    Sig: Take 1 tablet (20 mg total) by mouth at bedtime.    Dispense:  90 tablet    Refill:  3     Follow up plan: Return in about 4 months (around 02/11/2021) for 4 month fasting lab only then 1 week later Annual Physical.  Future labs ordered for 02/08/21   Sheppard Coil  Parks Ranger, [facility] 10/12/2020, 2:47 PM

## 2020-10-12 NOTE — Assessment & Plan Note (Signed)
Elevated A1c up to 8.6 from prior 8 to 8.1 range With some hyperglycemia still reported on CBGs Without Hypoglycemia Complicated by DM retinopathy  Plan:  1. Keep on current ozempic 1mg  weekly - offered dose inc to ozempic 2mg  reconsider 2. RESTART and Adhere to Metformin 500mg  BID, dose inc in future if indicated 2. Encourage improved lifestyle - low carb, low sugar diet, reduce portion size, continue improving regular exercise 3. Check CBG, bring log to next visit for review

## 2020-10-12 NOTE — Patient Instructions (Addendum)
Thank you for coming to the office today.  Recommend to take the PM dose of Metformin 500mg  twice a day  Keep track of blood sugars, if abnormal or still elevated >150 consistently let me know we can double the dose next time or sooner  Also consider Ozempic 2mg  higher dose if needed.  DUE for FASTING BLOOD WORK (no food or drink after midnight before the lab appointment, only water or coffee without cream/sugar on the morning of)  SCHEDULE "Lab Only" visit in the morning at the clinic for lab draw in 1 YEAR  - Make sure Lab Only appointment is at about 1 week before your next appointment, so that results will be available  For Lab Results, once available within 2-3 days of blood draw, you can can log in to MyChart online to view your results and a brief explanation. Also, we can discuss results at next follow-up visit.   Please schedule a Follow-up Appointment to: Return in about 4 months (around 02/11/2021) for 4 month fasting lab only then 1 week later Annual Physical.  If you have any other questions or concerns, please feel free to call the office or send a message through [place]. You may also schedule an earlier appointment if necessary.  Additionally, you may be receiving a survey about your experience at our office within a few days to 1 week by e-mail or mail. We value your feedback.  [name], DO [place]

## 2020-10-13 ENCOUNTER — Other Ambulatory Visit: Payer: Self-pay

## 2020-10-13 ENCOUNTER — Encounter (INDEPENDENT_AMBULATORY_CARE_PROVIDER_SITE_OTHER): Payer: Self-pay | Admitting: Ophthalmology

## 2020-10-13 MED ORDER — BEVACIZUMAB CHEMO INJECTION 1.25MG/0.05ML SYRINGE FOR KALEIDOSCOPE
1.2500 mg | INTRAVITREAL | Status: AC | PRN
  Administered 2020-10-12: 1.25 mg via INTRAVITREAL

## 2020-10-23 ENCOUNTER — Other Ambulatory Visit (HOSPITAL_COMMUNITY): Payer: Self-pay

## 2020-10-26 ENCOUNTER — Other Ambulatory Visit: Payer: Self-pay

## 2020-10-26 MED FILL — Semaglutide Soln Pen-inj 1 MG/DOSE (4 MG/3ML): SUBCUTANEOUS | 28 days supply | Qty: 3 | Fill #1 | Status: AC

## 2020-11-05 NOTE — Progress Notes (Signed)
[facility] Note  11/09/2020     CHIEF COMPLAINT Patient presents for Retina Follow Up   HISTORY OF PRESENT ILLNESS: Shane Frazier. is a 55 y.o. male who presents to the clinic today for:   HPI     Retina Follow Up   Patient presents with  Diabetic Retinopathy.  In both eyes.  This started 4 weeks ago.  I, the attending physician,  performed the HPI with the patient and updated documentation appropriately.        Comments   Patient here for 4 weeks retina follow up for NPDR OU. Patient states vision OS getting better. No eye pain.       Last edited by Bernarda Caffey, MD on 11/09/2020  1:09 PM.      Referring physician: Olin Hauser, DO [place],  [place] [address]   HISTORICAL INFORMATION:   Selected notes from the MEDICAL RECORD NUMBER Referred by Dr. Ellin Mayhew LEE: 06.22.2022 Ocular Hx- NPDR OU, Mac edema OS, CSR OS BCVA OD 20/20, OS 20/40    CURRENT MEDICATIONS: No current outpatient medications on file. (Ophthalmic Drugs)   No current facility-administered medications for this visit. (Ophthalmic Drugs)   Current Outpatient Medications (Other)  Medication Sig   atorvastatin (LIPITOR) 20 MG tablet Take 1 tablet (20 mg total) by mouth at bedtime.   Blood Glucose Calibration (FREESTYLE CONTROL SOLUTION) LIQD Use to calibrate glucometer to check blood sugar   Blood Glucose Monitoring Suppl (FREESTYLE FREEDOM LITE) w/Device KIT USE TO CHECK BLOOD SUGAR UP TO 2 TIMES DAILY   Blood Glucose Monitoring Suppl (FREESTYLE LITE) DEVI Use to check blood sugar up to 2 x daily   diclofenac Sodium (VOLTAREN) 1 % GEL Apply 2 g topically 3 (three) times daily as needed (left knee pain arthritis).   diltiazem (CARDIZEM LA) 420 MG 24 hr tablet TAKE 1 TABLET (420 MG TOTAL) BY MOUTH EVERY MORNING.   FREESTYLE LITE test strip USE TO CHECK BLOOD SUGAR UP TO 2 TIMES DAILY (Patient taking differently: USE TO CHECK BLOOD SUGAR UP TO 2 TIMES  DAILY)   hydrochlorothiazide (HYDRODIURIL) 25 MG tablet Take 1 tablet (25 mg total) by mouth daily.   Lancets (FREESTYLE) lancets CHECK BLOOD SUGAR UP TO 2 TIMES DAILY (Patient taking differently: CHECK BLOOD SUGAR UP TO 2 TIMES DAILY)   levothyroxine (SYNTHROID) 200 MCG tablet TAKE 1 TABLET BY MOUTH DAILY BEFORE BREAKFAST.   lisinopril (ZESTRIL) 20 MG tablet TAKE 1 TABLET BY MOUTH EVERY MORNING.   metFORMIN (GLUCOPHAGE) 500 MG tablet TAKE 1 TABLET (500 MG TOTAL) BY MOUTH 2 (TWO) TIMES DAILY WITH A MEAL.   OZEMPIC, 1 MG/DOSE, 4 MG/3ML SOPN INJECT 1 MG INTO THE SKIN ONCE A WEEK.   tadalafil (CIALIS) 20 MG tablet 1 tab po 1 hour prior to intercourse   No current facility-administered medications for this visit. (Other)      REVIEW OF SYSTEMS: ROS   Positive for: Musculoskeletal, Endocrine, Cardiovascular Last edited by Theodore Demark, COA on 11/09/2020  9:27 AM.        ALLERGIES Allergies  Allergen Reactions   Other Other (See Comments)    BRAZILIAN NUTS ONLY-"THROAT CLOSES UP"    PAST MEDICAL HISTORY Past Medical History:  Diagnosis Date   CHF (congestive heart failure), NYHA class I, acute on chronic, systolic ([place])    Concussion 2017   cause of seizure after hit head at work   Difficult intubation  patient unaware of this diagnosis   ED (erectile dysfunction)    Hypothyroidism    Seizures ([place]) 05-20-15   X 1-PT HIT HIS HEAD AT WORK AND THEN HAD A SEIZURE   Past Surgical History:  Procedure Laterality Date   COLONOSCOPY WITH PROPOFOL N/A 12/07/2018   Procedure: COLONOSCOPY WITH PROPOFOL;  Surgeon: Jonathon Bellows, MD;  Location: [facility] ENDOSCOPY;  Service: Gastroenterology;  Laterality: N/A;   FLEXIBLE BRONCHOSCOPY N/A 03/27/2018   Procedure: [place] With C-arm;  Surgeon: [name], MD;  Location: [facility] ORS;  Service: Pulmonary;  Laterality: N/A;   HERNIA REPAIR     as a child,umbilical   SHOULDER ARTHROSCOPY WITH ROTATOR CUFF REPAIR  AND SUBACROMIAL DECOMPRESSION Left 09/09/2015   Procedure: SHOULDER ARTHROSCOPY, SUBACROMIAL DECOMPRESSION,BICEPS TENSON RELEASE AND MINI OPEN ROTATOR CUFF REPAIR;  Surgeon: Leanor Kail, MD;  Location: [facility] ORS;  Service: Orthopedics;  Laterality: Left;   VENTRAL HERNIA REPAIR N/A 10/27/2017   Procedure: HERNIA REPAIR VENTRAL ADULT;  Surgeon: Jules Husbands, MD;  Location: [facility] ORS;  Service: General;  Laterality: N/A;    FAMILY HISTORY Family History  Problem Relation Age of Onset   Hypertension Mother    Cancer Father    Diabetes Sister    Thyroid cancer Sister     SOCIAL HISTORY Social History   Tobacco Use   Smoking status: Never   Smokeless tobacco: Never  Vaping Use   Vaping Use: Never used  Substance Use Topics   Alcohol use: Yes    Comment: occasionally. rarely   Drug use: No         OPHTHALMIC EXAM:  Base Eye Exam     Visual Acuity (Snellen - Linear)       Right Left   Dist cc 20/20 20/25   Dist ph cc  NI    Correction: Glasses         Tonometry (Tonopen, 9:25 AM)       Right Left   Pressure 15 14         Pupils       Dark Light Shape React APD   Right 4 3 Round Minimal None   Left 4 3 Round Minimal None         Visual Fields (Counting fingers)       Left Right    Full Full         Extraocular Movement       Right Left    Full, Ortho Full, Ortho         Neuro/Psych     Oriented x3: Yes   Mood/Affect: Normal         Dilation     Both eyes: 1.0% Mydriacyl, 2.5% Phenylephrine @ 9:24 AM           Slit Lamp and Fundus Exam     Slit Lamp Exam       Right Left   Lids/Lashes Dermatochalasis - upper lid Dermatochalasis - upper lid   Conjunctiva/Sclera nasal and temporal pinguecula nasal and temporal pinguecula   Cornea arcus arcus   Anterior Chamber Deep and quiet Deep and quiet   Iris Round and dilated, No NVI Round and dilated, No NVI   Lens 2+ Nuclear sclerosis, 2+ Cortical cataract 2+ Nuclear sclerosis,  2+ Cortical cataract   Vitreous Vitreous syneresis Vitreous syneresis         Fundus Exam       Right Left   Disc Pink and  Sharp Pink and Sharp, +cupping   C/D Ratio 0.6 0.6   Macula Flat, Good foveal reflex, scattered Microaneurysms, focal Exudates IT, trace cystic changes non-central improving Blunted foveal reflex, +edema centrally and IN mac improving, +MA, large CWS/RAM along IT arteriorle improving   Vessels attenuated, Tortuous attenuated, Tortuous, Copper wiring, +RAM IN macula, severe attenuation temporal periphery   Periphery Attached, scattered MA/DBH    Attached              Refraction     Wearing Rx       Sphere Cylinder Axis   Right -2.25 +1.00 077   Left -2.00 +1.00 121            IMAGING AND PROCEDURES  Imaging and Procedures for 11/09/2020  OCT, Retina - OU - Both Eyes       Right Eye Quality was good. Central Foveal Thickness: 269. Progression has improved. Findings include normal foveal contour, no IRF, no SRF, intraretinal hyper-reflective material, vitreomacular adhesion (Interval improvement in cystic changes superior to fovea).   Left Eye Quality was good. Central Foveal Thickness: 299. Progression has improved. Findings include abnormal foveal contour, intraretinal hyper-reflective material, intraretinal fluid, subretinal fluid (Interval improvement in IRF/SRF/foveal contour).   Notes *Images captured and stored on drive  Diagnosis / Impression:  OD: Interval improvement in cystic changes superior to fovea OS: Interval improvement in IRF/SRF/foveal contour  Clinical management:  See below  Abbreviations: NFP - Normal foveal profile. CME - cystoid macular edema. PED - pigment epithelial detachment. IRF - intraretinal fluid. SRF - subretinal fluid. EZ - ellipsoid zone. ERM - epiretinal membrane. ORA - outer retinal atrophy. ORT - outer retinal tubulation. SRHM - subretinal hyper-reflective material. IRHM - intraretinal hyper-reflective  material      Intravitreal Injection, Pharmacologic Agent - OS - Left Eye       Time Out 11/09/2020. 9:43 AM. Confirmed correct patient, procedure, site, and patient consented.   Anesthesia Topical anesthesia was used. Anesthetic medications included Lidocaine 2%, Proparacaine 0.5%.   Procedure Preparation included 5% betadine to ocular surface, eyelid speculum. A supplied needle was used.   Injection: 1.25 mg Bevacizumab 1.61m/0.05ml   Route: Intravitreal, Site: Left Eye   NDC: [id number] Lot:: [id number] Expiration date: 12/14/2020, Waste: 0.05 mL   Post-op Post injection exam found visual acuity of at least counting fingers. The patient tolerated the procedure well. There were no complications. The patient received written and verbal post procedure care education.               ASSESSMENT/PLAN:    ICD-10-CM   1. Moderate nonproliferative diabetic retinopathy of both eyes with macular edema associated with type 2 diabetes mellitus (HCC)  EH47.6546Intravitreal Injection, Pharmacologic Agent - OS - Left Eye    Bevacizumab (AVASTIN) SOLN 1.25 mg    2. Retinal edema  H35.81 OCT, Retina - OU - Both Eyes    3. Retinal macroaneurysm, left eye  H35.012 Intravitreal Injection, Pharmacologic Agent - OS - Left Eye    Bevacizumab (AVASTIN) SOLN 1.25 mg    4. Essential hypertension  I10     5. Hypertensive retinopathy of both eyes  H35.033     6. Combined forms of age-related cataract of both eyes  H25.813        1,2. Moderate non-proliferative diabetic retinopathy, both eyes  - s/p IVA OS #1 (06.27.22) - FA (06.27.22) shows leaking MA; no NV OU focal RAM along IT arteriole with blockage and leakage  - OCT  shows interval improvement in diabetic macular edema OS -- improved IRF/SRF and foveal contour - BCVA 20/20 OD, 20/25 OS (improved OS) - recommend IVA #2 OS today, 07.25.22 for DME - pt wishes to proceed with injection - RBA of procedure discussed, questions  answered - informed consent obtained and signed - see procedure note - f/u in 4 wks -- DFE/OCT, possible injection  3. Retinal arterial macroaneurysm w/ macular edema OS -- improving  - recommend IVA OS #2 today as above   4,5. Hypertensive retinopathy OU - discussed importance of tight BP control - monitor  6. Mixed Cataract OU - The symptoms of cataract, surgical options, and treatments and risks were discussed with patient. - discussed diagnosis and progression - not yet visually significant - monitor for now  Ophthalmic Meds Ordered this visit:  Meds ordered this encounter  Medications   Bevacizumab (AVASTIN) SOLN 1.25 mg     Return in about 4 weeks (around 12/07/2020) for 4 weeks NPDR OU .  There are no Patient Instructions on file for this visit.   Explained the diagnoses, plan, and follow up with the patient and they expressed understanding.  Patient expressed understanding of the importance of proper follow up care.   This document serves as a record of services personally performed by Gardiner Sleeper, MD, PhD. It was created on their behalf by Roselee Nova, COMT. The creation of this record is the provider's dictation and/or activities during the visit.  Electronically signed by: Roselee Nova, COMT 11/09/20 1:12 PM  Gardiner Sleeper, M.D., Ph.D. Diseases & Surgery of the Retina and [place]   I have reviewed the above documentation for accuracy and completeness, and I agree with the above. Gardiner Sleeper, M.D., Ph.D. 11/09/20 1:12 PM  Abbreviations: M myopia (nearsighted); A astigmatism; H hyperopia (farsighted); P presbyopia; Mrx spectacle prescription;  CTL contact lenses; OD right eye; OS left eye; OU both eyes  XT exotropia; ET esotropia; PEK punctate epithelial keratitis; PEE punctate epithelial erosions; DES dry eye syndrome; MGD meibomian gland dysfunction; ATs artificial tears; PFAT's preservative free artificial tears; [place]  nuclear sclerotic cataract; PSC posterior subcapsular cataract; ERM epi-retinal membrane; PVD posterior vitreous detachment; RD retinal detachment; DM diabetes mellitus; DR diabetic retinopathy; NPDR non-proliferative diabetic retinopathy; PDR proliferative diabetic retinopathy; CSME clinically significant macular edema; DME diabetic macular edema; dbh dot blot hemorrhages; CWS cotton wool spot; POAG primary open angle glaucoma; C/D cup-to-disc ratio; HVF humphrey visual field; GVF goldmann visual field; OCT optical coherence tomography; IOP intraocular pressure; BRVO Branch retinal vein occlusion; CRVO central retinal vein occlusion; CRAO central retinal artery occlusion; BRAO branch retinal artery occlusion; RT retinal tear; SB scleral buckle; PPV pars plana vitrectomy; VH Vitreous hemorrhage; PRP panretinal laser photocoagulation; IVK intravitreal kenalog; VMT vitreomacular traction; MH Macular hole;  NVD neovascularization of the disc; NVE neovascularization elsewhere; AREDS age related eye disease study; ARMD age related macular degeneration; POAG primary open angle glaucoma; EBMD epithelial/anterior basement membrane dystrophy; ACIOL anterior chamber intraocular lens; IOL intraocular lens; PCIOL posterior chamber intraocular lens; Phaco/IOL phacoemulsification with intraocular lens placement; [place] photorefractive keratectomy; LASIK laser assisted in situ keratomileusis; HTN hypertension; DM diabetes mellitus; COPD chronic obstructive pulmonary disease

## 2020-11-09 ENCOUNTER — Encounter (INDEPENDENT_AMBULATORY_CARE_PROVIDER_SITE_OTHER): Payer: Self-pay | Admitting: Ophthalmology

## 2020-11-09 ENCOUNTER — Ambulatory Visit (INDEPENDENT_AMBULATORY_CARE_PROVIDER_SITE_OTHER): Payer: No Typology Code available for payment source | Admitting: Ophthalmology

## 2020-11-09 ENCOUNTER — Other Ambulatory Visit: Payer: Self-pay

## 2020-11-09 DIAGNOSIS — H35012 Changes in retinal vascular appearance, left eye: Secondary | ICD-10-CM | POA: Diagnosis not present

## 2020-11-09 DIAGNOSIS — I10 Essential (primary) hypertension: Secondary | ICD-10-CM

## 2020-11-09 DIAGNOSIS — E113313 Type 2 diabetes mellitus with moderate nonproliferative diabetic retinopathy with macular edema, bilateral: Secondary | ICD-10-CM | POA: Diagnosis not present

## 2020-11-09 DIAGNOSIS — H3581 Retinal edema: Secondary | ICD-10-CM

## 2020-11-09 DIAGNOSIS — H25813 Combined forms of age-related cataract, bilateral: Secondary | ICD-10-CM

## 2020-11-09 DIAGNOSIS — H35033 Hypertensive retinopathy, bilateral: Secondary | ICD-10-CM

## 2020-11-09 MED ORDER — BEVACIZUMAB CHEMO INJECTION 1.25MG/0.05ML SYRINGE FOR KALEIDOSCOPE
1.2500 mg | INTRAVITREAL | Status: AC | PRN
  Administered 2020-11-09: 1.25 mg via INTRAVITREAL

## 2020-11-15 ENCOUNTER — Encounter (INDEPENDENT_AMBULATORY_CARE_PROVIDER_SITE_OTHER): Payer: Self-pay | Admitting: Ophthalmology

## 2020-11-15 ENCOUNTER — Other Ambulatory Visit: Payer: Self-pay

## 2020-11-16 ENCOUNTER — Other Ambulatory Visit: Payer: Self-pay

## 2020-11-17 ENCOUNTER — Other Ambulatory Visit: Payer: Self-pay

## 2020-11-18 ENCOUNTER — Other Ambulatory Visit: Payer: Self-pay

## 2020-11-18 MED ORDER — NEOMYCIN-POLYMYXIN-DEXAMETH 0.1 % OP SUSP
OPHTHALMIC | 0 refills | Status: DC
  Filled 2020-11-18: qty 5, 30d supply, fill #0

## 2020-12-08 ENCOUNTER — Encounter (INDEPENDENT_AMBULATORY_CARE_PROVIDER_SITE_OTHER): Payer: No Typology Code available for payment source | Admitting: Ophthalmology

## 2020-12-08 DIAGNOSIS — H25813 Combined forms of age-related cataract, bilateral: Secondary | ICD-10-CM

## 2020-12-08 DIAGNOSIS — H3581 Retinal edema: Secondary | ICD-10-CM

## 2020-12-08 DIAGNOSIS — I10 Essential (primary) hypertension: Secondary | ICD-10-CM

## 2020-12-08 DIAGNOSIS — H35033 Hypertensive retinopathy, bilateral: Secondary | ICD-10-CM

## 2020-12-08 DIAGNOSIS — H35012 Changes in retinal vascular appearance, left eye: Secondary | ICD-10-CM

## 2020-12-08 DIAGNOSIS — E113313 Type 2 diabetes mellitus with moderate nonproliferative diabetic retinopathy with macular edema, bilateral: Secondary | ICD-10-CM

## 2020-12-14 ENCOUNTER — Encounter (INDEPENDENT_AMBULATORY_CARE_PROVIDER_SITE_OTHER): Payer: No Typology Code available for payment source | Admitting: Ophthalmology

## 2020-12-14 DIAGNOSIS — H35033 Hypertensive retinopathy, bilateral: Secondary | ICD-10-CM

## 2020-12-14 DIAGNOSIS — H35012 Changes in retinal vascular appearance, left eye: Secondary | ICD-10-CM

## 2020-12-14 DIAGNOSIS — H25813 Combined forms of age-related cataract, bilateral: Secondary | ICD-10-CM

## 2020-12-14 DIAGNOSIS — I10 Essential (primary) hypertension: Secondary | ICD-10-CM

## 2020-12-14 DIAGNOSIS — E113313 Type 2 diabetes mellitus with moderate nonproliferative diabetic retinopathy with macular edema, bilateral: Secondary | ICD-10-CM

## 2020-12-14 DIAGNOSIS — H3581 Retinal edema: Secondary | ICD-10-CM

## 2020-12-15 ENCOUNTER — Other Ambulatory Visit: Payer: Self-pay | Admitting: Family Medicine

## 2020-12-15 ENCOUNTER — Encounter (INDEPENDENT_AMBULATORY_CARE_PROVIDER_SITE_OTHER): Payer: No Typology Code available for payment source | Admitting: Ophthalmology

## 2020-12-15 ENCOUNTER — Other Ambulatory Visit: Payer: Self-pay

## 2020-12-15 DIAGNOSIS — E113313 Type 2 diabetes mellitus with moderate nonproliferative diabetic retinopathy with macular edema, bilateral: Secondary | ICD-10-CM

## 2020-12-15 DIAGNOSIS — H43813 Vitreous degeneration, bilateral: Secondary | ICD-10-CM

## 2020-12-15 DIAGNOSIS — E113293 Type 2 diabetes mellitus with mild nonproliferative diabetic retinopathy without macular edema, bilateral: Secondary | ICD-10-CM

## 2020-12-15 DIAGNOSIS — I10 Essential (primary) hypertension: Secondary | ICD-10-CM | POA: Diagnosis not present

## 2020-12-15 DIAGNOSIS — H35033 Hypertensive retinopathy, bilateral: Secondary | ICD-10-CM | POA: Diagnosis not present

## 2020-12-16 ENCOUNTER — Other Ambulatory Visit: Payer: Self-pay

## 2020-12-16 MED FILL — Semaglutide Soln Pen-inj 1 MG/DOSE (4 MG/3ML): SUBCUTANEOUS | 28 days supply | Qty: 3 | Fill #0 | Status: AC

## 2020-12-17 ENCOUNTER — Other Ambulatory Visit: Payer: Self-pay

## 2020-12-18 ENCOUNTER — Encounter: Payer: Self-pay | Admitting: Family Medicine

## 2020-12-22 ENCOUNTER — Other Ambulatory Visit: Payer: Self-pay

## 2020-12-22 ENCOUNTER — Ambulatory Visit (INDEPENDENT_AMBULATORY_CARE_PROVIDER_SITE_OTHER): Payer: No Typology Code available for payment source | Admitting: Family Medicine

## 2020-12-22 ENCOUNTER — Encounter: Payer: Self-pay | Admitting: Family Medicine

## 2020-12-22 VITALS — BP 130/72 | HR 85 | Ht 67.5 in | Wt 245.6 lb

## 2020-12-22 DIAGNOSIS — F1021 Alcohol dependence, in remission: Secondary | ICD-10-CM

## 2020-12-22 NOTE — Patient Instructions (Addendum)
Thank you for coming to the office today.  PSYCHIATRY (AND THERAPY-COUSENLING)  [place] ([facility] Address: [id number], [place], [place] [address] Hours: 8:30AM-5PM Phone: [phone]  [employer], available walk-in 9am-4pm M-F [place], [place] [address] Hours: [place] (M-F, walk in available) [phone]   Please schedule a Follow-up Appointment to: Return in about 4 weeks (around 01/19/2021) for 4 weeks follow-up alcohol / psych updates.  If you have any other questions or concerns, please feel free to call the office or send a message through [place]. You may also schedule an earlier appointment if necessary.  Additionally, you may be receiving a survey about your experience at our office within a few days to 1 week by e-mail or mail. We value your feedback.  [name], DO [facility], [facility]  Alcohol Withdrawal Syndrome Alcohol withdrawal syndrome is a group of symptoms that can develop when a person who drinks heavily and regularly stops drinking or drinks less. Alcohol withdrawal syndrome can be mild or severe, and it may even be life-threatening. Alcohol withdrawal syndrome usually affects people who have alcohol use disorder, which may also be called alcoholism. Alcohol use disorder is when a person is unable to control his or her alcohol use, and drinking too much or too often causes problems at home, at work, or in relationships. What are the causes? Drinking heavily and drinking on a regular basis cause changes in brain chemistry. Over time, the body becomes dependent on alcohol. When alcohol use stops, the chemistry system in the brain becomes unbalanced and causes the symptoms of alcohol withdrawal. What increases the risk? Alcohol withdrawal syndrome is more likely to occur in people who drink more than the recommended limit of alcohol (2  drinks a day for men or 1 drink a day for non-pregnant women). It is also more likely to affect heavy drinkers who have been using alcohol for long periods of time. The more a person drinks and the longer he or she drinks, the greater the risk of alcohol withdrawal syndrome. Severe withdrawal is more likely to develop in someone who: Had severe alcohol withdrawal in the past. Had a seizure during a previous episode of alcohol withdrawal. Is elderly. Uses other drugs. Has a long-term (chronic) medical problem, such as heart, lung, or liver disease. Has depression. Does not get enough nutrients from his or her diet (malnutrition). What are the signs or symptoms? Symptoms of this condition can be mild to moderate, or they can be severe. Symptoms may develop a few hours (or up to a day) after a person changes his or her drinking patterns. During the 48 hours after he or she has stopped drinking, the following symptoms may go away or get better: Uncontrollable shaking (tremor). Sweating. Headache. Anxiety. Inability to relax (agitation). Trouble sleeping (insomnia). Irregular heartbeats (palpitations). Alcohol cravings. Seizure. The following symptoms may get worse 24-48 hours after a person has decreased or stopped alcohol use, and they may gradually improve over a period of days or weeks: Nausea and vomiting. Fatigue. Sensitivity to light and sounds. Confusion and inability to think clearly. Loss of appetite. Mood swings, irritability, depression, and anxiety. Insomnia and nightmares. The following symptoms are severe and life-threatening. When these symptoms occur together, they are called delirium tremens (DTs): High blood pressure. Increased heart rate. Trouble breathing. Seizures. These may go away along with other symptoms, or they may persist. Seeing, hearing, feeling,  smelling, or tasting things that are not there (hallucinations). If you experience hallucinations, they usually  begin 12-24 hours after a change in drinking patterns. Delirium tremens requires immediate hospitalization. How is this diagnosed? This condition may be diagnosed based on: Your symptoms and medical history. Your history of alcohol use. Your health care provider may ask questions about your drinking behavior. It is important to be honest when you answer these questions. A psychological assessment. A physical exam. Blood tests or urine tests to measure blood alcohol level and to rule out other causes of symptoms. MRI or CT scan. This may be done if you seem to have abnormal thinking or behaviors (altered mental status). Diagnosis can be difficult. People going through withdrawal often avoid seeking medical care and are not thinking clearly. Friends and family members play an important role in recognizing symptoms and encouraging loved ones to get treatment. How is this treated? Most people with symptoms of withdrawal can be treated outside of a hospital setting (outpatient treatment), with close monitoring such as daily check-ins with a health care provider and counseling. You may need treatment at a hospital or treatment center (inpatient treatment) if: You have a history of delirium tremens or seizures. You have severe symptoms. You are addicted to other drugs. You cannot swallow medicine. You have a serious medical condition such as heart failure. You experienced withdrawal in the past but then you continued drinking alcohol. You are not likely to commit to an outpatient treatment schedule. Treatment may involve: Monitoring your blood pressure, pulse, and breathing. IV fluids to keep you hydrated. Medicines to reduce withdrawal symptoms and discomfort (benzodiazepines). Medicine to reduce anxiety. Medicine to prevent or control seizures. Multivitamins and B vitamins. Having a health care provider check on you daily. It is important to get treatment for alcohol withdrawal early. Getting  treatment early can: Speed up your recovery from withdrawal symptoms. Make you more successful with long-term stoppage of alcohol use (sobriety). If you need help to stop drinking, your health care provider may recommend a long-term treatment plan that includes: Medicines to help treat alcohol use disorder. Substance abuse counseling. Support groups. Follow these instructions at home:  Take over-the-counter and prescription medicines (including vitamin supplements) only as told by your health care provider. Do not drink alcohol. Do not drive until your health care provider approves. Have someone you trust stay with you or be available if you need help with your symptoms or with not drinking. Drink enough fluid to keep your urine pale yellow. Consider joining an alcohol support group or treatment program. These can provide emotional support, advice, and guidance. Keep all follow-up visits as told by your health care provider. This is important. Contact a health care provider if: Your symptoms get worse instead of better. You cannot eat or drink without vomiting. You are struggling with not drinking alcohol. You cannot stop drinking alcohol. Get help right away if: You have an irregular heartbeat. You have chest pain. You have trouble breathing. You have a seizure for the first time. You hallucinate. You become very confused. Summary Alcohol withdrawal is a group of symptoms that can develop when a person who drinks heavily and regularly stops drinking or drinks less. Symptoms of this condition can be mild to moderate, or they can be severe. Treatment may include hospitalization, medicine, and counseling. This information is not intended to replace advice given to you by your health care provider. Make sure you discuss any questions you have with your health care  provider. Document Revised: 02/24/2020 Document Reviewed: 02/24/2020 Elsevier Patient Education  2022 [employer].

## 2020-12-22 NOTE — Progress Notes (Signed)
Subjective:    Patient ID: Shane Picket., male    DOB: 13-Aug-1965, 55 y.o.   MRN: [id number]  Shane Rudisill. is a 54 y.o. male presenting on 12/22/2020 for Alcohol Problem   HPI  Alcohol Dependence, in early remission.  He can skip drinking for 3-6 days. By his report, he says last drink was 6 days ago. He does not have shaking or tremors or complications. No prior complicated withdrawal or hospitalization He would admit to drinking mostly at night when off a shift or out of work and would binge several days. He would typically drink vodka pint to fifth alcohol over 1-2 days. 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  He is interested in Psychiatry mental health referral / therapy today.  He is here with his wife who expresses concern on this topic as well. There are concerns with underlying mood or stress / anxiety component as well underlying.  Depression screen [facility] 2/9 10/12/2020 09/10/2019 05/13/2019  Decreased Interest 0 0 0  Down, Depressed, Hopeless 0 0 0  PHQ - 2 Score 0 0 0    Social History   Tobacco Use   Smoking status: Never   Smokeless tobacco: Never  Vaping Use   Vaping Use: Never used  Substance Use Topics   Alcohol use: Yes    Comment: occasionally. rarely   Drug use: No    Review of Systems Per HPI unless specifically indicated above     Objective:    BP 130/72   Pulse 85   Ht 5' 7.5" (1.715 m)   Wt 245 lb 9.6 oz (111.4 kg)   SpO2 98%   BMI 37.90 kg/m   Wt Readings from Last 3 Encounters:  12/22/20 245 lb 9.6 oz (111.4 kg)  10/12/20 245 lb 3.2 oz (111.2 kg)  07/28/20 247 lb (112 kg)    Physical Exam Vitals and nursing note reviewed.  Constitutional:      General: He is not in acute distress.    Appearance: Normal appearance. He is well-developed. He is not diaphoretic.     Comments: Well-appearing, comfortable, cooperative  HENT:      Head: Normocephalic and atraumatic.  Eyes:     General:        Right eye: No discharge.        Left eye: No discharge.     Conjunctiva/sclera: Conjunctivae normal.  Cardiovascular:     Rate and Rhythm: Normal rate.  Pulmonary:     Effort: Pulmonary effort is normal.  Skin:    General: Skin is warm and dry.     Findings: No erythema or rash.  Neurological:     Mental Status: He is alert and oriented to person, place, and time.  Psychiatric:        Mood and Affect: Mood normal.        Behavior: Behavior normal.        Thought Content: Thought content normal.     Comments: Well groomed, good eye contact, normal speech and thoughts   Results for orders placed or performed in visit on 09/10/20  POCT glycosylated hemoglobin (Hb A1C)  Result Value Ref Range   Hemoglobin A1C 8.6 (A) 4.0 - 5.6 %   HbA1c POC (<> result, manual entry)     HbA1c, POC (prediabetic range)     HbA1c, POC (  controlled diabetic range)        Assessment & Plan:   Problem List Items Addressed This Visit   None Visit Diagnoses     Alcohol dependence in early full remission [facility])    -  Primary   Relevant Orders   Ambulatory referral to Psychiatry       alcohol dependence abuse, early remission with recently quit alcohol intake 6 days ago, since 12/17/20, he has underlying mental health issues that may be contributing to his alcohol dependence, he will need substance abuse counseling and therapy may benefit from med management and also eval of underlying mental health for mood vs anxiety contributing   Referral to [facility] today  Counseling on alcohol cessation and management  No history of complicated withdrawal Defer medication management today Consider Naltrexone, will hold off at this time.  Orders Placed This Encounter  Procedures   Ambulatory referral to Psychiatry    Referral Priority:   Routine    Referral Type:   Psychiatric    Referral Reason:   Specialty Services Required    Requested  Specialty:   Psychiatry    Number of Visits Requested:   1      No orders of the defined types were placed in this encounter.     Follow up plan: Return in about 4 weeks (around 01/19/2021) for 4 weeks follow-up alcohol / psych updates.   [name], [facility] 12/22/2020, 2:24 PM

## 2021-01-12 ENCOUNTER — Ambulatory Visit (INDEPENDENT_AMBULATORY_CARE_PROVIDER_SITE_OTHER): Payer: No Typology Code available for payment source | Admitting: Ophthalmology

## 2021-01-12 ENCOUNTER — Other Ambulatory Visit: Payer: Self-pay

## 2021-01-12 ENCOUNTER — Encounter (INDEPENDENT_AMBULATORY_CARE_PROVIDER_SITE_OTHER): Payer: No Typology Code available for payment source | Admitting: Ophthalmology

## 2021-01-12 ENCOUNTER — Encounter (INDEPENDENT_AMBULATORY_CARE_PROVIDER_SITE_OTHER): Payer: Self-pay | Admitting: Ophthalmology

## 2021-01-12 DIAGNOSIS — H25813 Combined forms of age-related cataract, bilateral: Secondary | ICD-10-CM

## 2021-01-12 DIAGNOSIS — H35012 Changes in retinal vascular appearance, left eye: Secondary | ICD-10-CM

## 2021-01-12 DIAGNOSIS — E113313 Type 2 diabetes mellitus with moderate nonproliferative diabetic retinopathy with macular edema, bilateral: Secondary | ICD-10-CM | POA: Diagnosis not present

## 2021-01-12 DIAGNOSIS — H3581 Retinal edema: Secondary | ICD-10-CM

## 2021-01-12 DIAGNOSIS — I10 Essential (primary) hypertension: Secondary | ICD-10-CM | POA: Diagnosis not present

## 2021-01-12 DIAGNOSIS — H35033 Hypertensive retinopathy, bilateral: Secondary | ICD-10-CM

## 2021-01-12 MED ORDER — BEVACIZUMAB CHEMO INJECTION 1.25MG/0.05ML SYRINGE FOR KALEIDOSCOPE
1.2500 mg | INTRAVITREAL | Status: AC | PRN
  Administered 2021-01-12: 1.25 mg via INTRAVITREAL

## 2021-01-12 NOTE — Progress Notes (Signed)
[facility] Note  01/12/2021     CHIEF COMPLAINT Patient presents for Retina Follow Up   HISTORY OF PRESENT ILLNESS: Shane Conard. is a 55 y.o. male who presents to the clinic today for:   HPI     Retina Follow Up   Patient presents with  Diabetic Retinopathy.  In both eyes.  Severity is moderate.  Duration of 4 weeks.  Since onset it is stable.  I, the attending physician,  performed the HPI with the patient and updated documentation appropriately.        Comments   Patient states vision the same OU. BS runs about 160 in the am. Last a1c was 8.5, checked May or June 2022.      Last edited by Bernarda Caffey, MD on 01/12/2021 11:58 AM.    Pt states vision is doing well  Referring physician: Olin Hauser, DO [place],  [place] [address]   HISTORICAL INFORMATION:   Selected notes from the MEDICAL RECORD NUMBER Referred by Dr. Ellin Mayhew LEE: 06.22.2022 Ocular Hx- NPDR OU, Mac edema OS, CSR OS BCVA OD 20/20, OS 20/40    CURRENT MEDICATIONS: Current Outpatient Medications (Ophthalmic Drugs)  Medication Sig   neomycin-polymyxin-dexamethasone (MAXITROL) 0.1 % ophthalmic suspension Location: Right Eye. 1 GTT AFFECTED EYE QID X 7 DAYS, DAILY X 7 DAYS, STOP (Patient not taking: Reported on 01/12/2021)   No current facility-administered medications for this visit. (Ophthalmic Drugs)   Current Outpatient Medications (Other)  Medication Sig   atorvastatin (LIPITOR) 20 MG tablet Take 1 tablet (20 mg total) by mouth at bedtime.   Blood Glucose Calibration (FREESTYLE CONTROL SOLUTION) LIQD Use to calibrate glucometer to check blood sugar   Blood Glucose Monitoring Suppl (FREESTYLE FREEDOM LITE) w/Device KIT USE TO CHECK BLOOD SUGAR UP TO 2 TIMES DAILY   Blood Glucose Monitoring Suppl (FREESTYLE LITE) DEVI Use to check blood sugar up to 2 x daily   diclofenac Sodium (VOLTAREN) 1 % GEL Apply 2 g topically 3 (three) times daily as needed  (left knee pain arthritis).   diltiazem (CARDIZEM LA) 420 MG 24 hr tablet TAKE 1 TABLET (420 MG TOTAL) BY MOUTH EVERY MORNING.   FREESTYLE LITE test strip USE TO CHECK BLOOD SUGAR UP TO 2 TIMES DAILY (Patient taking differently: USE TO CHECK BLOOD SUGAR UP TO 2 TIMES DAILY)   hydrochlorothiazide (HYDRODIURIL) 25 MG tablet Take 1 tablet (25 mg total) by mouth daily.   Lancets (FREESTYLE) lancets CHECK BLOOD SUGAR UP TO 2 TIMES DAILY (Patient taking differently: CHECK BLOOD SUGAR UP TO 2 TIMES DAILY)   levothyroxine (SYNTHROID) 200 MCG tablet TAKE 1 TABLET BY MOUTH DAILY BEFORE BREAKFAST.   lisinopril (ZESTRIL) 20 MG tablet TAKE 1 TABLET BY MOUTH EVERY MORNING.   metFORMIN (GLUCOPHAGE) 500 MG tablet TAKE 1 TABLET (500 MG TOTAL) BY MOUTH 2 (TWO) TIMES DAILY WITH A MEAL.   OZEMPIC, 1 MG/DOSE, 4 MG/3ML SOPN INJECT 1 MG INTO THE SKIN ONCE A WEEK.   tadalafil (CIALIS) 20 MG tablet 1 tab po 1 hour prior to intercourse   No current facility-administered medications for this visit. (Other)   REVIEW OF SYSTEMS: ROS   Positive for: Musculoskeletal, Endocrine, Cardiovascular Negative for: Constitutional, Gastrointestinal, Neurological, Skin, Genitourinary, HENT, Eyes, Respiratory, Psychiatric, Allergic/Imm, Heme/Lymph Last edited by Roselee Nova D, COT on 01/12/2021  8:17 AM.     ALLERGIES Allergies  Allergen Reactions   Other Other (See Comments)  BRAZILIAN NUTS ONLY-"THROAT CLOSES UP"    PAST MEDICAL HISTORY Past Medical History:  Diagnosis Date   CHF (congestive heart failure), NYHA class I, acute on chronic, systolic ([place])    Concussion 2017   cause of seizure after hit head at work   Difficult intubation    patient unaware of this diagnosis   ED (erectile dysfunction)    Hypothyroidism    Seizures ([place]) 05-20-15   X 1-PT HIT HIS HEAD AT WORK AND THEN HAD A SEIZURE   Past Surgical History:  Procedure Laterality Date   COLONOSCOPY WITH PROPOFOL N/A 12/07/2018   Procedure: COLONOSCOPY  WITH PROPOFOL;  Surgeon: Jonathon Bellows, MD;  [facility] ENDOSCOPY;  Service: Gastroenterology;  Laterality: N/A;   FLEXIBLE BRONCHOSCOPY N/A 03/27/2018   Procedure: Tea With C-arm;  Surgeon: [name], MD;  Location: [facility] ORS;  Service: Pulmonary;  Laterality: N/A;   HERNIA REPAIR     as a child,umbilical   SHOULDER ARTHROSCOPY WITH ROTATOR CUFF REPAIR AND SUBACROMIAL DECOMPRESSION Left 09/09/2015   Procedure: SHOULDER ARTHROSCOPY, SUBACROMIAL DECOMPRESSION,BICEPS TENSON RELEASE AND MINI OPEN ROTATOR CUFF REPAIR;  Surgeon: Leanor Kail, MD;  Location: [facility] ORS;  Service: Orthopedics;  Laterality: Left;   VENTRAL HERNIA REPAIR N/A 10/27/2017   Procedure: HERNIA REPAIR VENTRAL ADULT;  Surgeon: Jules Husbands, MD;  Location: [facility] ORS;  Service: General;  Laterality: N/A;    FAMILY HISTORY Family History  Problem Relation Age of Onset   Hypertension Mother    Cancer Father    Diabetes Sister    Thyroid cancer Sister     SOCIAL HISTORY Social History   Tobacco Use   Smoking status: Never   Smokeless tobacco: Never  Vaping Use   Vaping Use: Never used  Substance Use Topics   Alcohol use: Yes    Comment: occasionally. rarely   Drug use: No       OPHTHALMIC EXAM:  Base Eye Exam     Visual Acuity (Snellen - Linear)       Right Left   Dist cc 20/20 20/20    Correction: Glasses         Tonometry (Tonopen, 8:21 AM)       Right Left   Pressure 14 14         Pupils       Dark Light Shape React APD   Right 4 3 Round Brisk None   Left 4 3 Round Brisk None         Visual Fields (Counting fingers)       Left Right    Full Full         Extraocular Movement       Right Left    Full, Ortho Full, Ortho         Neuro/Psych     Oriented x3: Yes   Mood/Affect: Normal         Dilation     Both eyes: 1.0% Mydriacyl, 2.5% Phenylephrine @ 8:21 AM           Slit Lamp and Fundus Exam     Slit Lamp Exam        Right Left   Lids/Lashes Dermatochalasis - upper lid Dermatochalasis - upper lid   Conjunctiva/Sclera nasal and temporal pinguecula nasal and temporal pinguecula   Cornea arcus arcus   Anterior Chamber Deep and quiet Deep and quiet   Iris Round and dilated, No NVI Round and dilated, No NVI  Lens 2+ Nuclear sclerosis, 2+ Cortical cataract 2+ Nuclear sclerosis, 2+ Cortical cataract   Vitreous Vitreous syneresis Vitreous syneresis         Fundus Exam       Right Left   Disc Pink and Sharp Pink and Sharp, +cupping   C/D Ratio 0.6 0.6   Macula Flat, Good foveal reflex, scattered Microaneurysms, focal Exudates IT, trace cystic changes non-central Good foveal reflex, cluster of fine exudates and edema IN macual, surrounding large macroaneurysm, large CWS/RAM along IT arteriorle improving   Vessels attenuated, Tortuous attenuated, Tortuous, Copper wiring, +RAM IN macula, severe attenuation temporal periphery   Periphery Attached, scattered MA/DBH    Attached              Refraction     Wearing Rx       Sphere Cylinder Axis   Right -2.25 +1.00 077   Left -2.00 +1.00 121            IMAGING AND PROCEDURES  Imaging and Procedures for 01/12/2021  OCT, Retina - OU - Both Eyes       Right Eye Quality was good. Central Foveal Thickness: 273. Progression has been stable. Findings include normal foveal contour, no SRF, intraretinal hyper-reflective material, vitreomacular adhesion , intraretinal fluid (Persistent cystic changes superior to fovea, cystic changes inferior macula - slightly increased).   Left Eye Quality was good. Central Foveal Thickness: 253. Progression has improved. Findings include intraretinal hyper-reflective material, intraretinal fluid, normal foveal contour, no SRF (Mild Interval improvement in IRF and IRHM nasal macula).   Notes *Images captured and stored on drive  Diagnosis / Impression:  OD: Persistent cystic changes superior to fovea, cystic  changes inferior macula - slightly increased OS: Mild Interval improvement in IRF and IRHM nasal macula  Clinical management:  See below  Abbreviations: NFP - Normal foveal profile. CME - cystoid macular edema. PED - pigment epithelial detachment. IRF - intraretinal fluid. SRF - subretinal fluid. EZ - ellipsoid zone. ERM - epiretinal membrane. ORA - outer retinal atrophy. ORT - outer retinal tubulation. SRHM - subretinal hyper-reflective material. IRHM - intraretinal hyper-reflective material      Intravitreal Injection, Pharmacologic Agent - OS - Left Eye       Time Out 01/12/2021. 8:52 AM. Confirmed correct patient, procedure, site, and patient consented.   Anesthesia Topical anesthesia was used. Anesthetic medications included Lidocaine 2%, Proparacaine 0.5%.   Procedure Preparation included 5% betadine to ocular surface, eyelid speculum. A supplied needle was used.   Injection: 1.25 mg Bevacizumab 1.68m/0.05ml   Route: Intravitreal, Site: Left Eye   NDC:: [id number] Lot: [id number] , Expiration date: 03/03/2021, Waste: 0 mL   Post-op Post injection exam found visual acuity of at least counting fingers. The patient tolerated the procedure well. There were no complications. The patient received written and verbal post procedure care education. Post injection medications were not given.            ASSESSMENT/PLAN:    ICD-10-CM   1. Moderate nonproliferative diabetic retinopathy of both eyes with macular edema associated with type 2 diabetes mellitus (HCC)  EI78.6767Intravitreal Injection, Pharmacologic Agent - OS - Left Eye    Bevacizumab (AVASTIN) SOLN 1.25 mg    2. Retinal edema  H35.81 OCT, Retina - OU - Both Eyes    3. Retinal macroaneurysm, left eye  H35.012     4. Essential hypertension  I10     5. Hypertensive retinopathy of both eyes  H35.033  6. Combined forms of age-related cataract of both eyes  H25.813     1,2. Moderate non-proliferative diabetic  retinopathy, both eyes  - s/p IVA OS #1 (06.27.22), #2 (07.25.22), #3 (08.30.22; JDM) - FA (06.27.22) shows leaking MA; no NV OU focal RAM along IT arteriole with blockage and leakage  - OCT shows OD: Persistent cystic changes superior to fovea, cystic changes inferior macula - slightly increased; OS: Mild Interval improvement in IRF and IRHM nasal macula - BCVA 20/20 OD, 20/20 OS (improved OS) - recommend IVA #4 OS today, 09.27.22 for DME - pt wishes to proceed with injection - RBA of procedure discussed, questions answered - informed consent obtained and signed - see procedure note - f/u in 4 wks -- DFE/OCT, possible injection  3. Retinal arterial macroaneurysm w/ macular edema OS -- improving  - recommend IVA OS #4 as above   4,5. Hypertensive retinopathy OU - discussed importance of tight BP control - monitor  6. Mixed Cataract OU - The symptoms of cataract, surgical options, and treatments and risks were discussed with patient. - discussed diagnosis and progression - not yet visually significant - monitor for now  Ophthalmic Meds Ordered this visit:  Meds ordered this encounter  Medications   Bevacizumab (AVASTIN) SOLN 1.25 mg      Return in about 4 weeks (around 02/09/2021) for f/u NPDR OU, DFE, OCT.  There are no Patient Instructions on file for this visit.   Explained the diagnoses, plan, and follow up with the patient and they expressed understanding.  Patient expressed understanding of the importance of proper follow up care.   This document serves as a record of services personally performed by Gardiner Sleeper, MD, PhD. It was created on their behalf by Roselee Nova, COMT. The creation of this record is the provider's dictation and/or activities during the visit.  Electronically signed by: Roselee Nova, COMT 01/12/21 12:00 PM  This document serves as a record of services personally performed by Gardiner Sleeper, MD, PhD. It was created on their behalf by San Jetty.  Owens Shark, OA an ophthalmic technician. The creation of this record is the provider's dictation and/or activities during the visit.    Electronically signed by: San Jetty. Owens Shark, [place] 09.27.2022 12:00 PM   Gardiner Sleeper, M.D., Ph.D. Diseases & Surgery of the Retina and Vitreous [facility]  I have reviewed the above documentation for accuracy and completeness, and I agree with the above. Gardiner Sleeper, M.D., Ph.D. 01/12/21 12:01 PM   Abbreviations: M myopia (nearsighted); A astigmatism; H hyperopia (farsighted); P presbyopia; Mrx spectacle prescription;  CTL contact lenses; OD right eye; OS left eye; OU both eyes  XT exotropia; ET esotropia; PEK punctate epithelial keratitis; PEE punctate epithelial erosions; DES dry eye syndrome; MGD meibomian gland dysfunction; ATs artificial tears; PFAT's preservative free artificial tears; [place] nuclear sclerotic cataract; PSC posterior subcapsular cataract; ERM epi-retinal membrane; PVD posterior vitreous detachment; RD retinal detachment; DM diabetes mellitus; DR diabetic retinopathy; NPDR non-proliferative diabetic retinopathy; PDR proliferative diabetic retinopathy; CSME clinically significant macular edema; DME diabetic macular edema; dbh dot blot hemorrhages; CWS cotton wool spot; POAG primary open angle glaucoma; C/D cup-to-disc ratio; HVF humphrey visual field; GVF goldmann visual field; OCT optical coherence tomography; IOP intraocular pressure; BRVO Branch retinal vein occlusion; CRVO central retinal vein occlusion; CRAO central retinal artery occlusion; BRAO branch retinal artery occlusion; RT retinal tear; SB scleral buckle; PPV pars plana vitrectomy; VH Vitreous hemorrhage; PRP panretinal laser photocoagulation;  IVK intravitreal kenalog; VMT vitreomacular traction; MH Macular hole;  NVD neovascularization of the disc; NVE neovascularization elsewhere; AREDS age related eye disease study; ARMD age related macular degeneration; POAG  primary open angle glaucoma; EBMD epithelial/anterior basement membrane dystrophy; ACIOL anterior chamber intraocular lens; IOL intraocular lens; PCIOL posterior chamber intraocular lens; Phaco/IOL phacoemulsification with intraocular lens placement; [place] photorefractive keratectomy; LASIK laser assisted in situ keratomileusis; HTN hypertension; DM diabetes mellitus; COPD chronic obstructive pulmonary disease

## 2021-01-18 ENCOUNTER — Other Ambulatory Visit: Payer: Self-pay

## 2021-01-18 ENCOUNTER — Encounter: Payer: Self-pay | Admitting: Family Medicine

## 2021-01-18 ENCOUNTER — Ambulatory Visit (INDEPENDENT_AMBULATORY_CARE_PROVIDER_SITE_OTHER): Payer: No Typology Code available for payment source | Admitting: Family Medicine

## 2021-01-18 VITALS — BP 140/88 | HR 95 | Ht 67.5 in | Wt 242.2 lb

## 2021-01-18 DIAGNOSIS — F1021 Alcohol dependence, in remission: Secondary | ICD-10-CM

## 2021-01-18 MED FILL — Semaglutide Soln Pen-inj 1 MG/DOSE (4 MG/3ML): SUBCUTANEOUS | 28 days supply | Qty: 3 | Fill #1 | Status: AC

## 2021-01-18 NOTE — Progress Notes (Signed)
Subjective:    Patient ID: Shane Frazier., male    DOB: Jun 25, 1965, 55 y.o.   MRN: [id number]  Shane Frazier. is a 55 y.o. male presenting on 01/18/2021 for Alcohol Problem   HPI  Alcohol Dependence, in early remission.  - Last visit with me 12/22/20, for initial visit for same problem alcohol abuse, treated with alcohol cessation, see prior notes for background information. - Interval update with he remained off alcohol for about a week, he did resume drinking every other day, drinks liquor and few drinks at a time. - Today patient reports no new withdrawal symptoms recently. He has gone 2-3 weeks without alcohol in the past and has not had withdrawal symptoms.  He has upcoming CBT therapy session initially coming up this Thursday 10/6 with LCSW through [facility].  No prior complicated withdrawal or hospitalization  He would admit to drinking mostly at night when off a shift or out of work and would binge several days. He would typically drink vodka pint to fifth alcohol over 1-2 days. 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  He does not drink on days he is working.    Depression screen [facility] 2/9 01/18/2021 10/12/2020 09/10/2019  Decreased Interest 0 0 0  Down, Depressed, Hopeless 0 0 0  PHQ - 2 Score 0 0 0   No flowsheet data found.    Social History   Tobacco Use   Smoking status: Never   Smokeless tobacco: Never  Vaping Use   Vaping Use: Never used  Substance Use Topics   Alcohol use: Yes    Comment: occasionally. rarely   Drug use: No    Review of Systems Per HPI unless specifically indicated above     Objective:    BP 140/88 (BP Location: Left Arm, Cuff Size: Normal)   Pulse 95   Ht 5' 7.5" (1.715 m)   Wt 242 lb 3.2 oz (109.9 kg)   SpO2 97%   BMI 37.37 kg/m   Wt Readings from Last 3 Encounters:  01/18/21 242 lb 3.2 oz (109.9 kg)  12/22/20 245 lb 9.6  oz (111.4 kg)  10/12/20 245 lb 3.2 oz (111.2 kg)    Physical Exam Vitals and nursing note reviewed.  Constitutional:      General: He is not in acute distress.    Appearance: Normal appearance. He is well-developed. He is not diaphoretic.     Comments: Well-appearing, comfortable, cooperative  HENT:     Head: Normocephalic and atraumatic.  Eyes:     General:        Right eye: No discharge.        Left eye: No discharge.     Conjunctiva/sclera: Conjunctivae normal.  Cardiovascular:     Rate and Rhythm: Normal rate.  Pulmonary:     Effort: Pulmonary effort is normal.  Skin:    General: Skin is warm and dry.     Findings: No erythema or rash.  Neurological:     Mental Status: He is alert and oriented to person, place, and time.  Psychiatric:        Mood and Affect: Mood normal.        Behavior: Behavior normal.        Thought Content: Thought content normal.     Comments: Well groomed, good eye contact, normal speech and thoughts  Results for orders placed or performed in visit on 09/10/20  POCT glycosylated hemoglobin (Hb A1C)  Result Value Ref Range   Hemoglobin A1C 8.6 (A) 4.0 - 5.6 %   HbA1c POC (<> result, manual entry)     HbA1c, POC (prediabetic range)     HbA1c, POC (controlled diabetic range)        Assessment & Plan:   Problem List Items Addressed This Visit   None Visit Diagnoses     Alcohol dependence in early full remission ([place])    -  Primary       Resumed alcohol intake every other day, reduced amount Has upcoming therapy CBT apt scheduled w/ [facility] LCSW on 10/6 Discussed goal for alcohol cessation and underlying evaluation of other mood/anxiety factors with psych/therapy Consider medications when if indicated or recommended by therapist   No orders of the defined types were placed in this encounter.     Follow up plan: Return in about 4 weeks (around 02/15/2021) for keep apt as scheduled.   [name], [facility] 01/18/2021, 9:36 AM

## 2021-01-18 NOTE — Patient Instructions (Addendum)
Thank you for coming to the office today.  Flu Shot at end of the month at next visit.  Keep up with Therapy apt on Thursday 10/6 and work on plan to cut back and quit drinking with them.  If you need medication, you can contact us and follow-up we can discuss alcohol medication but need a plan in place first from therapist.  Please schedule a Follow-up Appointment to: Return in about 4 weeks (around 02/15/2021) for keep apt as scheduled.  If you have any other questions or concerns, please feel free to call the office or send a message through [facility] [facility]. You may also schedule an earlier appointment if necessary.  Additionally, you may be receiving a survey about your experience at our office within a few days to 1 week by e-mail or mail. We value your feedback.  [name], DO [place]

## 2021-01-21 ENCOUNTER — Other Ambulatory Visit: Payer: Self-pay

## 2021-01-21 ENCOUNTER — Ambulatory Visit (INDEPENDENT_AMBULATORY_CARE_PROVIDER_SITE_OTHER): Payer: No Typology Code available for payment source | Admitting: Licensed Clinical Social Worker

## 2021-01-21 DIAGNOSIS — F1020 Alcohol dependence, uncomplicated: Secondary | ICD-10-CM

## 2021-01-21 NOTE — Progress Notes (Addendum)
Virtual Visit via Telephone Note  I connected with Shane Frazier. on 01/21/21 at  8:00 AM EDT by telephone and verified that I am speaking with the correct person using two identifiers.  Location: Patient: car Provider:office   I discussed the limitations, risks, security and privacy concerns of performing an evaluation and management service by telephone and the availability of in person appointments. I also discussed with the patient that there may be a patient responsible charge related to this service. The patient expressed understanding and agreed to proceed.  I discussed the assessment and treatment plan with the patient. The patient was provided an opportunity to ask questions and all were answered. The patient agreed with the plan and demonstrated an understanding of the instructions.   The patient was advised to call back or seek an in-person evaluation if the symptoms worsen or if the condition fails to improve as anticipated.  I provided 55 minutes of non-face-to-face time during this encounter.   Shane Messier, LCSW   Comprehensive Clinical Assessment (CCA) Note  01/21/2021 Shane Frazier [id number]  Chief Complaint:  Chief Complaint  Patient presents with   Alcohol Problem   Visit Diagnosis: alcohol use disorder, severe    CCA Screening, Triage and Referral (STR)  Patient Reported Information How did you hear about [place]? Other (Comment)  Referral name: self/internal medicine  Referral phone number: No data recorded  Whom do you see for routine medical problems? Primary Care  Practice/Facility Name: [facility]  Practice/Facility Phone Number: No data recorded Name of Contact: [name] Number: No data recorded Contact Fax Number: No data recorded Prescriber Name: No data recorded Prescriber Address (if known): No data recorded  What Is the Reason for Your Visit/Call Today? No data recorded How Long Has This Been Causing You Problems? >  than 6 months  What Do You Feel Would Help You the Most Today? No data recorded  Have You Recently Been in Any Inpatient Treatment (Hospital/Detox/Crisis Center/28-Day Program)? No  Name/Location of Program/Hospital:No data recorded How Long Were You There? No data recorded When Were You Discharged? No data recorded  Have You Ever Received Services From [facility] Before? No  Who Do You See at [name]? No data recorded  Have You Recently Had Any Thoughts About Hurting Yourself? No  Are You Planning to Commit Suicide/Harm Yourself At This time? No   Have you Recently Had Thoughts About [place]? No  Explanation: No data recorded  Have You Used Any Alcohol or Drugs in the Past 24 Hours? Yes  How Long Ago Did You Use Drugs or Alcohol? No data recorded What Did You Use and How Much? No data recorded  Do You Currently Have a Therapist/Psychiatrist? No  Name of Therapist/Psychiatrist: No data recorded  Have You Been Recently Discharged From Any Office Practice or Programs? No  Explanation of Discharge From Practice/Program: No data recorded    CCA Screening Triage Referral Assessment Type of Contact: Phone Call  Is this Initial or Reassessment? No data recorded Date Telepsych consult ordered in [facility]:  No data recorded Time Telepsych consult ordered in [facility]:  No data recorded  Patient Reported Information Reviewed? No data recorded Patient Left Without Being Seen? No data recorded Reason for Not Completing Assessment: No data recorded  Collateral Involvement: No data recorded  Does Patient Have a [place]? No data recorded Name and Contact of Legal Guardian: No data recorded If Minor  and Not Living with Parent(s), Who has Custody? No data recorded Is CPS involved or ever been involved? Never  Is APS involved or ever been involved? Never   Patient Determined To Be At Risk for Harm To Self or Others Based on Review of Patient  Reported Information or Presenting Complaint? No data recorded Method: No data recorded Availability of Means: No data recorded Intent: No data recorded Notification Required: No data recorded Additional Information for Danger to Others Potential: No data recorded Additional Comments for Danger to Others Potential: No data recorded Are There Guns or Other Weapons in Your Home? No data recorded Types of Guns/Weapons: No data recorded Are These Weapons Safely Secured?                            No data recorded Who Could Verify You Are Able To Have These Secured: No data recorded Do You Have any Outstanding Charges, Pending Court Dates, Parole/Probation? No data recorded Contacted To Inform of Risk of Harm To Self or Others: No data recorded  Location of Assessment: Other (comment) ([place] [facility])   Does Patient Present under Involuntary Commitment? No  IVC Papers Initial File Date: No data recorded  [place] of Residence: [name]   Patient Currently Receiving the Following Services: No data recorded  Determination of Need: No data recorded  Options For Referral: No data recorded    CCA Biopsychosocial Intake/Chief Complaint:  Client is a 55 y/o AA male referred by self/primary provider to address alcohol abuse. Client reports increase in alcohol use starting in 06/25/2016 when wife passed away and clt was left with 26 y/o daughter, daughter would leave for school and not come home until late; clt was out on medical leave at the time and sitting at home alone all day for 2 months then started drinking (more than social), 6 months later started dating (daughter didnt like b/c thought was trying to replace mom). 1 year later met current wife Sports coach) clt started drinking more (from 2-3 drinks daily up to pints of vodka, max fifth/day after work during the past 4 years.) Sometimes stop for 2-3 days, sometimes binging 2-3 days, longest time sober 1.5 months. Clt states Wife "didnt put my out"  but staying in different houses due to wife commenting and worrying about effect of alcohol on diabeties. "We still on good terms but cant live together." Client endorsed continued strained relationship with bio daughter causing some worry, has 23 y/o step daughter extremely close with mother "its too much". Clt and wife took care of wifes father for 8 months  after MIL had cancer surgery, "all we've been doing is taking care of people and my daughter is still acting a fool" Clt and wife have 2 houses; each stay in separate houses; "couldn't live with each other;" had to take care of father before his death, currently clt and wife take care of MIL transporting to all doc apts currently.  Current Symptoms/Problems: Client reported: "Something is making me drinking so i feel numb but i dont know why, when i drink i say mean things but i dont remember them" Client has a goal of achieiving/maintainig sobriety for health and relationships.   Patient Reported Schizophrenia/Schizoaffective Diagnosis in Past: No   Strengths: "jack of all trades" fixing things, ride 4 wheelers, cooking  Preferences: outpatient therapy not residential due to needing to continue working  Abilities: No data recorded  Type of Services Patient Feels  are Needed: No data recorded  Initial Clinical Notes/Concerns: goal of treatment: stop completly or at least manage it "i'm a functional alcoholic i cant stop working"   Mental Health Symptoms Depression:   None (client denies)   Duration of Depressive symptoms: No data recorded  Mania:   None (client denies)   Anxiety:    None (client denies)   Psychosis:   None (client denies)   Duration of Psychotic symptoms: No data recorded  Trauma:   None (client denies)   Obsessions:   None ("drinking")   Compulsions:   None (per clt "drinking")   Inattention:   None   Hyperactivity/Impulsivity:   None   Oppositional/Defiant Behaviors:   None   Emotional  Irregularity:   None   Other Mood/Personality Symptoms:  No data recorded   Mental Status Exam Appearance and self-care  Stature:   Average   Weight:   Overweight   Clothing:   Casual   Grooming:   Normal   Cosmetic use:   None (unable to determine, phone visit)   Posture/gait:   Other (Comment) (phone visit)   Motor activity:   -- (phone visit)   Sensorium  Attention:   Normal   Concentration:   Normal   Orientation:   X5   Recall/memory:   Normal; Defective in Short-term ("fine sober, sometimes don't remember what i say if i have been drinking alot" reports wife often tells him what he said but he does not recall saying due to alcohol use)   Affect and Mood  Affect:   Appropriate   Mood:   Dysphoric   Relating  Eye contact:   None (UTA phone visit)   Facial expression:   -- (phone visit UTA)   Attitude toward examiner:   Cooperative   Thought and Language  Speech flow:  Clear and Coherent   Thought content:   Appropriate to Mood and Circumstances   Preoccupation:   None   Hallucinations:   Other (Comment) (NA)   Organization:  No data recorded  [employer] of Knowledge:   Average   Intelligence:   Average   Abstraction:   Normal   Judgement:   Fair   Reality Testing:   Adequate   Insight:   Fair   Decision Making:   Normal   Social Functioning  Social Maturity:   Isolates; Impulsive   Social Judgement:   Publishing rights manager"   Stress  Stressors:   Other (Comment) (alcohol use)   Coping Ability:   Deficient supports   Skill Deficits:   Interpersonal; Self-control   Supports:   Family; Support needed     Religion: Religion/Spirituality Are You A Religious Person?: Yes How Might This Affect Treatment?: wife minister; feels support through Engineer, civil (consulting): Leisure / Recreation Do You Have Hobbies?: Yes Leisure and Hobbies: cooking  Exercise/Diet: Exercise/Diet Do You  Exercise?: No ("i need to because of my diabeties") Have You Gained or Lost A Significant Amount of Weight in the Past Six Months?: No Do You Follow a Special Diet?: No Do You Have Any Trouble Sleeping?: No   CCA Employment/Education Employment/Work Situation: Employment / Work Situation Employment Situation: Employed 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 Long has Patient Been Employed?: 2.5 yrs Are You Satisfied With Your Job?: Yes Do You Work More Than One Job?: No Work Stressors: 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, wrote Holiday representative to get cdl back  with restrictions; states does not drink heavy before working next day Patient's Job has Been Impacted by Current Illness: No (clt denies) What is the Longest Time Patient has Held a Job?: 22 years Where was the 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) Has Patient ever Been in the [employer]?: No  Education: Education Is Patient Currently Attending School?: No Last Grade Completed: 12 Did Teacher, adult education From [employer]?: Yes Did You Attend College?: Yes What Type of College Degree Do you Have?: didnt get degree; trade classes Did [place]?: No Did You Have An Individualized Education Program (IIEP): No Did You Have Any Difficulty At School?: No Patient's Education Has Been Impacted by Current Illness: No   CCA Family/Childhood History Family and Relationship History: Family history Marital status: Married Number of Years Married: 3 What types of issues is patient dealing with in the relationship?: wife and clt caretakers for in laws causing strain, wife emeshed with bio daughter; wife worried about clt drinking so not living together currently Additional relationship information: Client had preious wife who passed away. Current wife 12 yrs younger than client Are you sexually active?: No What is your sexual  orientation?: straight Has your sexual activity been affected by drugs, alcohol, medication, or emotional stress?: living separately Does patient have children?: Yes How many children?: 1 How is patient's relationship with their children?: "she's still acting a fool" was close with bio mom and acted out after death; one step daughter he feels is too close with current wife (meeting weekly, talking daily, wife worried if go out of town daughter will be lonely)  Childhood History:  Childhood History By whom was/is the patient raised?: Both parents, Other (Comment) (parents, family friends, aunt/uncle) Additional childhood history information: clt stayed with mom and dad until 61rd grade and parents divorced; dad put clt with family friends in [place] and stayed until 8th grade "issues arised" (comments about him not being bio kid) and moved in with dads sister and kids became baby of family and stayed through highschool "it wasnt bad but they had 7 children" Clt worked during year to cover cost "wasn't loved but was provided a place to stay". Both parents and one sister alcoholic (reason client was placed out of the home) Description of patient's relationship with caregiver when they were a child: "love was there but not shown how i needed it" dad put one sister with family in [place]; 3 oldest sisters much older and stayed with mother; was 40 years before saw mom after 3rd grade; dad visited 1-2x month Patient's description of current relationship with people who raised him/her: parents decased; mom died age 3 (sister picked clt up from work went to visit mom after not seen in 3 weeks, found mom dead) dad died age 18 (cancer) Does patient have siblings?: Yes Number of Siblings: 4 Description of patient's current relationship with siblings: 4 bio sisters did not grow up with; grew up around multiple counsins Did patient suffer any verbal/emotional/physical/sexual abuse as a child?: No Did  patient suffer from severe childhood neglect?: No (See above. clt does not consider neglect since was placed in other homes after divoce because "parents were out doing whatever they wanted to do but my dad eventually had some plan for [place]") Has patient ever been sexually abused/assaulted/raped as an adolescent or adult?: No Was the patient ever a victim of a crime or a disaster?: No Witnessed domestic violence?: No Has patient been affected  by domestic violence as an adult?: No  Child/Adolescent Assessment:     CCA Substance Use Alcohol/Drug Use: Alcohol / Drug Use Pain Medications: none Prescriptions: see MAR History of alcohol / drug use?: Yes (denies cravings says "its just something to do" dont think about it all day) Longest period of sobriety (when/how long): 1.5 months in past 4 years Negative Consequences of Use: Personal relationships Withdrawal Symptoms: Other (Comment) (denies) Substance #1 Name of Substance 1: alcohol 1 - Age of First Use: 8 ("dad left bottle under seat") 1 - Amount (size/oz): 1 pint-1 5th vodka 1 - Frequency: 5-7 nights weekly 1 - Duration: heavy drinking 4 years 1 - Last Use / Amount: 01/19/21 1 - Method of Aquiring: store bought 1- Route of Use: oral/drink Substance #2 Name of Substance 2: tobacco 2 - Age of First Use: 13 2 - Frequency: daily 2 - Duration: 40 years 2 - Last Use / Amount: 01/21/21 2 - Method of Aquiring: store bought 2 - Route of Substance Use: dip                     ASAM's:  Six Dimensions of Multidimensional Assessment  Dimension 1:  Acute Intoxication and/or Withdrawal Potential:   Dimension 1:  Description of individual's past and current experiences of substance use and withdrawal: denies acute withdrwal sx if stopping for a few days, denies seizures related to alcohol previously; has not stopped for more than a month in the past 4 years  Dimension 2:  Biomedical Conditions and Complications:   Dimension 2:   Description of patient's biomedical conditions and  complications: diabeties, overweight, high BP clt/wife worried alcohol turns to sugar and will have negative effect on health  Dimension 3:  Emotional, Behavioral, or Cognitive Conditions and Complications:  Dimension 3:  Description of emotional, behavioral, or cognitive conditions and complications: denies MH symptoms of anxiety/depression  Dimension 4:  Readiness to Change:  Dimension 4:  Description of Readiness to Change criteria: identifies problems related to drinking; voices desire to stop; unwilling to attend residential/iop due to clt voiced need to continue working  Dimension 5:  Relapse, Continued use, or Continued Problem Potential:  Dimension 5:  Relapse, continued use, or continued problem potential critiera description: continued heavy use 4 years almost daily  Dimension 6:  Recovery/Living Environment:  Dimension 6:  Recovery/Iiving environment criteria description: living alone but has supportive wife in contact with; does not drink heavy when working following moring  ASAM Severity Score: ASAM's Severity Rating Score: 12  ASAM Recommended Level of Treatment: ASAM Recommended Level of Treatment: Level II Intensive Outpatient Treatment (client unwilling to attend higher level of care at this time)   Substance use Disorder (SUD) Substance Use Disorder (SUD)  Checklist Symptoms of Substance Use: Continued use despite having a persistent/recurrent physical/psychological problem caused/exacerbated by use, Continued use despite persistent or recurrent social, interpersonal problems, caused or exacerbated by use, Evidence of tolerance, Large amounts of time spent to obtain, use or recover from the substance(s), Persistent desire or unsuccessful efforts to cut down or control use, Repeated use in physically hazardous situations, Social, occupational, recreational activities given up or reduced due to use, Substance(s) often taken in larger  amounts or over longer times than was intended, Recurrent use that results in a failure to fulfill major role obligations (work, school, home)  Recommendations for Services/Supports/Treatments: Recommendations for Services/Supports/Treatments Recommendations For Services/Supports/Treatments: IOP (Intensive Outpatient Program), Individual Therapy (Client receptive to weekly outpatient therapy.)  DSM5  Diagnoses: Patient Active Problem List   Diagnosis Date Noted   Encounter for commercial driving license (CDL) exam 10/71/2524   Chronic right shoulder pain 09/10/2019   OSA on CPAP 09/10/2019   Morbid obesity ([place]) 02/11/2019   Headache disorder 08/06/2018   Failure of outpatient treatment    Shortness of breath    Hyperlipidemia associated with type 2 diabetes mellitus ([place]) 12/07/2017   Hypothyroidism 12/07/2017   Abnormal EKG 10/25/2017   Type 2 diabetes mellitus with diabetic retinopathy ([place]) 10/25/2017   Essential hypertension 10/04/2017   H/O traumatic brain injury 12/22/2016   S/P rotator cuff repair 10/13/2015   Injury of left rotator cuff 09/22/2015   History of seizure 05/22/2015   Abnormal MRI of head 05/13/2015   Migraine headache without aura 05/13/2015    Patient Centered Plan: Patient is on the following Treatment Plan(s):  Substance Abuse  Goal: client will achieve/maintain sobriety 7/7 days weekly.    Referrals to Alternative Service(s): Referred to Alternative Service(s):   Place:   Date:   Time:    Referred to Alternative Service(s):   Place:   Date:   Time:    Referred to Alternative Service(s):   Place:   Date:   Time:    Referred to Alternative Service(s):   Place:   Date:   Time:     Shane Messier, LCSW

## 2021-01-21 NOTE — Plan of Care (Signed)
See CCA

## 2021-01-27 ENCOUNTER — Ambulatory Visit (INDEPENDENT_AMBULATORY_CARE_PROVIDER_SITE_OTHER): Payer: No Typology Code available for payment source | Admitting: Licensed Clinical Social Worker

## 2021-01-27 ENCOUNTER — Encounter (HOSPITAL_COMMUNITY): Payer: Self-pay | Admitting: Licensed Clinical Social Worker

## 2021-01-27 ENCOUNTER — Other Ambulatory Visit: Payer: Self-pay

## 2021-01-27 DIAGNOSIS — F1020 Alcohol dependence, uncomplicated: Secondary | ICD-10-CM

## 2021-01-27 NOTE — Progress Notes (Signed)
Virtual Visit via Phone Note   I connected with Shane Frazier, Jr. on 01/27/21 at 10:00 pm EDT by a phone-enabled telemedicine application and verified that I am speaking with the correct person using two identifiers.   I discussed the limitations of evaluation and management by telemedicine and the availability of in person appointments. The patient expressed understanding and agreed to proceed.   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    Observations/Objective: Pt presented for his initial therapy session. 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 Pt has become a daily drinker, "I didn't used to drink like this." Cln and pt spent most of the session building a trusting, therapeutic relationship. Cln began the introduction of Motivational interviewing helping to enhancing patient's motivation to change by means of four guiding principles.     Assessment and Plan: Counselor will continue to meet with patient to address treatment plan goals. Patient will continue to follow recommendations of providers and implement skills learned in session.       Follow Up Instructions: I discussed the assessment and treatment plan with the patient. The patient was provided an opportunity to ask questions and all were answered. The patient agreed with the plan and demonstrated an understanding of the instructions.   The patient was advised to call back or seek an in-person evaluation if the symptoms worsen or if the condition fails to improve as anticipated.   I provided 60 minutes of non-face-to-face time during this encounter.     Sunshyne Horvath S, LCAS

## 2021-01-31 ENCOUNTER — Other Ambulatory Visit: Payer: Self-pay | Admitting: Family Medicine

## 2021-01-31 DIAGNOSIS — E039 Hypothyroidism, unspecified: Secondary | ICD-10-CM

## 2021-02-01 ENCOUNTER — Other Ambulatory Visit: Payer: Self-pay

## 2021-02-01 MED ORDER — LEVOTHYROXINE SODIUM 200 MCG PO TABS
ORAL_TABLET | Freq: Every day | ORAL | 1 refills | Status: DC
Start: 2021-02-01 — End: 2021-06-18
  Filled 2021-02-01: qty 90, 90d supply, fill #0

## 2021-02-01 NOTE — Telephone Encounter (Signed)
Requested medication (s) are due for refill today:   Yes  Requested medication (s) are on the active medication list:   Yes  Future visit scheduled:   Yes   Last ordered: 05/12/2019 #90, 0 refills  Returned because labs are due per protocol.   Requested Prescriptions  Pending Prescriptions Disp Refills   levothyroxine (SYNTHROID) 200 MCG tablet 90 tablet 0    Sig: TAKE 1 TABLET BY MOUTH DAILY BEFORE BREAKFAST.     Endocrinology:  Hypothyroid Agents Failed - 01/31/2021  7:28 PM      Failed - TSH needs to be rechecked within 3 months after an abnormal result. Refill until TSH is due.      Failed - TSH in normal range and within 360 days    TSH  Date Value Ref Range Status  02/10/2020 4.90 (H) 0.40 - 4.50 mIU/L Final          Passed - Valid encounter within last 12 months    Recent Outpatient Visits           2 weeks ago Alcohol dependence in early full remission [facility])   [place], DO   1 month ago Alcohol dependence in early full remission [facility])   [facility] Olin Hauser, DO   3 months ago Essential hypertension   [place], Devonne Doughty, DO   6 months ago Encounter for Department of Transportation (DOT) examination for trucking license   [facility] [place], [place], NP   8 months ago Type 2 diabetes mellitus with both eyes 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, without long-term current use of insulin [facility])   [facility] [place], Devonne Doughty, DO       Future Appointments             In 2 weeks [place], Devonne Doughty, DO [facility], [facility]

## 2021-02-03 NOTE — Progress Notes (Signed)
[facility] Note  02/09/2021     CHIEF COMPLAINT Patient presents for Retina Follow Up   HISTORY OF PRESENT ILLNESS: Shane Frazier. is a 55 y.o. male who presents to the clinic today for:   HPI     Retina Follow Up   Patient presents with  Diabetic Retinopathy.  In both eyes.  This started weeks ago.  Severity is moderate.  Duration of weeks.  Since onset it is gradually worsening.  I, the attending physician,  performed the HPI with the patient and updated documentation appropriately.        Comments   Pt states vision seems the same OD.  Pt states he can tell his left eye is ready for treatment again.  Pt denies new or worsening floaters or fol OU.  Pt denies eye pain.      Last edited by Bernarda Caffey, MD on 02/09/2021  8:34 AM.    Pt states he can tell his vision is getting better   Referring physician: Anell Barr, [place],  [place] [address]   HISTORICAL INFORMATION:   Selected notes from the MEDICAL RECORD NUMBER Referred by Dr. Ellin Mayhew LEE: 06.22.2022 Ocular Hx- NPDR OU, Mac edema OS, CSR OS BCVA OD 20/20, OS 20/40   CURRENT MEDICATIONS: Current Outpatient Medications (Ophthalmic Drugs)  Medication Sig   neomycin-polymyxin-dexamethasone (MAXITROL) 0.1 % ophthalmic suspension Location: Right Eye. 1 GTT AFFECTED EYE QID X 7 DAYS, DAILY X 7 DAYS, STOP   No current facility-administered medications for this visit. (Ophthalmic Drugs)   Current Outpatient Medications (Other)  Medication Sig   atorvastatin (LIPITOR) 20 MG tablet Take 1 tablet (20 mg total) by mouth at bedtime.   Blood Glucose Calibration (FREESTYLE CONTROL SOLUTION) LIQD Use to calibrate glucometer to check blood sugar   Blood Glucose Monitoring Suppl (FREESTYLE LITE) DEVI Use to check blood sugar up to 2 x daily   diclofenac Sodium (VOLTAREN) 1 % GEL Apply 2 g topically 3 (three) times daily as needed (left knee pain arthritis).   diltiazem  (CARDIZEM LA) 420 MG 24 hr tablet TAKE 1 TABLET (420 MG TOTAL) BY MOUTH EVERY MORNING.   hydrochlorothiazide (HYDRODIURIL) 25 MG tablet Take 1 tablet (25 mg total) by mouth daily.   levothyroxine (SYNTHROID) 200 MCG tablet TAKE 1 TABLET BY MOUTH DAILY BEFORE BREAKFAST.   lisinopril (ZESTRIL) 20 MG tablet TAKE 1 TABLET BY MOUTH EVERY MORNING.   metFORMIN (GLUCOPHAGE) 500 MG tablet TAKE 1 TABLET (500 MG TOTAL) BY MOUTH 2 (TWO) TIMES DAILY WITH A MEAL.   OZEMPIC, 1 MG/DOSE, 4 MG/3ML SOPN INJECT 1 MG INTO THE SKIN ONCE A WEEK.   tadalafil (CIALIS) 20 MG tablet 1 tab po 1 hour prior to intercourse   No current facility-administered medications for this visit. (Other)   REVIEW OF SYSTEMS: ROS   Positive for: Musculoskeletal, Endocrine, Cardiovascular Negative for: Constitutional, Gastrointestinal, Neurological, Skin, Genitourinary, HENT, Eyes, Respiratory, Psychiatric, Allergic/Imm, Heme/Lymph Last edited by Doneen Poisson on 02/09/2021  7:56 AM.      ALLERGIES Allergies  Allergen Reactions   Other Other (See Comments)    BRAZILIAN NUTS ONLY-"THROAT CLOSES UP"    PAST MEDICAL HISTORY Past Medical History:  Diagnosis Date   CHF (congestive heart failure), NYHA class I, acute on chronic, systolic ([place])    Concussion 2017   cause of seizure after hit head at work   Difficult intubation    patient unaware of  this diagnosis   ED (erectile dysfunction)    Hypothyroidism    Seizures ([place]) 05-20-15   X 1-PT HIT HIS HEAD AT WORK AND THEN HAD A SEIZURE   Past Surgical History:  Procedure Laterality Date   COLONOSCOPY WITH PROPOFOL N/A 12/07/2018   Procedure: COLONOSCOPY WITH PROPOFOL;  Surgeon: Jonathon Bellows, MD;  Location: [facility];  Service: Gastroenterology;  Laterality: N/A;   FLEXIBLE BRONCHOSCOPY N/A 03/27/2018   Procedure: [place] With C-arm;  Surgeon: [name], MD;  Location: [facility];  Service: Pulmonary;  Laterality: N/A;   HERNIA REPAIR      as a child,umbilical   SHOULDER ARTHROSCOPY WITH ROTATOR CUFF REPAIR AND SUBACROMIAL DECOMPRESSION Left 09/09/2015   Procedure: SHOULDER ARTHROSCOPY, SUBACROMIAL DECOMPRESSION,BICEPS TENSON RELEASE AND MINI OPEN ROTATOR CUFF REPAIR;  Surgeon: Leanor Kail, MD;  Location: [facility] ORS;  Service: Orthopedics;  Laterality: Left;   VENTRAL HERNIA REPAIR N/A 10/27/2017   Procedure: HERNIA REPAIR VENTRAL ADULT;  Surgeon: Jules Husbands, MD;  Location: [facility] OR[facility];  Service: General;  Laterality: N/A;    FAMILY HISTORY Family History  Problem Relation Age of Onset   Hypertension Mother    Cancer Father    Diabetes Sister    Thyroid cancer Sister     SOCIAL HISTORY Social History   Tobacco Use   Smoking status: Never   Smokeless tobacco: Never  Vaping Use   Vaping Use: Never used  Substance Use Topics   Alcohol use: Yes    Comment: occasionally. rarely   Drug use: No       OPHTHALMIC EXAM:  Base Eye Exam     Visual Acuity (Snellen - Linear)       Right Left   Dist cc 20/20 20/20 -1    Correction: Glasses         Tonometry (Tonopen, 7:57 AM)       Right Left   Pressure 13 14         Pupils       Dark Light Shape React APD   Right 2 1 Round Minimal 0   Left 2 1 Round Minimal 0         Visual Fields       Left Right    Full Full         Extraocular Movement       Right Left    Full Full         Neuro/Psych     Oriented x3: Yes   Mood/Affect: Normal         Dilation     Both eyes: 1.0% Mydriacyl, 2.5% Phenylephrine @ 7:57 AM           Slit Lamp and Fundus Exam     Slit Lamp Exam       Right Left   Lids/Lashes Dermatochalasis - upper lid Dermatochalasis - upper lid   Conjunctiva/Sclera nasal and temporal pinguecula nasal and temporal pinguecula   Cornea arcus arcus   Anterior Chamber Deep and quiet Deep and quiet   Iris Round and dilated, No NVI Round and dilated, No NVI   Lens 2+ Nuclear sclerosis, 2+ Cortical cataract 2+  Nuclear sclerosis, 2+ Cortical cataract   Vitreous Vitreous syneresis Vitreous syneresis         Fundus Exam       Right Left   Disc Pink and Sharp Pink and Sharp, +cupping   C/D Ratio 0.6 0.6   Macula Flat,  Good foveal reflex, scattered Microaneurysms, focal Exudates superior macula, trace cystic changes non-central Good foveal reflex, cluster of fine exudates and edema IN macula, surrounding macroaneurysm -- all improving; CWS/RAM along IT arteriorle improving   Vessels attenuated, Tortuous attenuated, Tortuous, Copper wiring, +RAM IN macula, severe attenuation temporal periphery   Periphery Attached, scattered MA/DBH    Attached, scattered MA           Refraction     Wearing Rx       Sphere Cylinder Axis   Right -2.25 +1.00 077   Left -2.00 +1.00 121            IMAGING AND PROCEDURES  Imaging and Procedures for 02/09/2021  OCT, Retina - OU - Both Eyes       Right Eye Quality was good. Central Foveal Thickness: 273. Progression has improved. Findings include normal foveal contour, no SRF, intraretinal hyper-reflective material, vitreomacular adhesion , intraretinal fluid (Mild interval improvement in cystic changes).   Left Eye Quality was good. Central Foveal Thickness: 249. Progression has improved. Findings include intraretinal hyper-reflective material, normal foveal contour, no SRF, no IRF (Interval improvement in [facility] and cystic changes along IT arcades).   Notes *Images captured and stored on drive  Diagnosis / Impression:  OD: Mild interval improvement in cystic changes OS: Interval improvement in [facility] and cystic changes along IT arcades  Clinical management:  See below  Abbreviations: NFP - Normal foveal profile. CME - cystoid macular edema. PED - pigment epithelial detachment. IRF - intraretinal fluid. SRF - subretinal fluid. EZ - ellipsoid zone. ERM - epiretinal membrane. ORA - outer retinal atrophy. ORT - outer retinal tubulation. SRHM -  subretinal hyper-reflective material. IRHM - intraretinal hyper-reflective material      Intravitreal Injection, Pharmacologic Agent - OS - Left Eye       Time Out 02/09/2021. 8:05 AM. Confirmed correct patient, procedure, site, and patient consented.   Anesthesia Topical anesthesia was used. Anesthetic medications included Lidocaine 2%, Proparacaine 0.5%.   Procedure Preparation included 5% betadine to ocular surface, eyelid speculum. A supplied needle was used.   Injection: 1.25 mg Bevacizumab 1.33m/0.05ml   Route: Intravitreal, Site: Left Eye   NDC:: 08657-846-96 Lot: [id number] , Expiration date: 03/31/2021, Waste: 0 mL   Post-op Post injection exam found visual acuity of at least counting fingers. The patient tolerated the procedure well. There were no complications. The patient received written and verbal post procedure care education. Post injection medications were not given.             ASSESSMENT/PLAN:    ICD-10-CM   1. Moderate nonproliferative diabetic retinopathy of both eyes with macular edema associated with type 2 diabetes mellitus (HCC)  EE95.2841Intravitreal Injection, Pharmacologic Agent - OS - Left Eye    Bevacizumab (AVASTIN) SOLN 1.25 mg    2. Retinal edema  H35.81 OCT, Retina - OU - Both Eyes    3. Retinal macroaneurysm, left eye  H35.012     4. Essential hypertension  I10     5. Hypertensive retinopathy of both eyes  H35.033     6. Combined forms of age-related cataract of both eyes  H25.813      1,2. Moderate non-proliferative diabetic retinopathy, both eyes  - s/p IVA OS #1 (06.27.22), #2 (07.25.22), #3 (08.30.22; JDM), #4 (9.27.22) - FA (06.27.22) shows leaking MA; no NV OU; OS -- focal RAM along IT arteriole with blockage and leakage - OCT shows OD: Mild interval improvement in cystic changes; OS: Interval  improvement in [facility] and cystic changes along IT arcades - BCVA 20/20 OD, 20/20 OS (stably improved OS) - recommend IVA OS #5 today,  10.25.22 for DME - pt wishes to proceed with injection - RBA of procedure discussed, questions answered - informed consent obtained and signed - see procedure note - f/u in 5-6 wks -- DFE/OCT, possible injection  3. Retinal arterial macroaneurysm w/ macular edema OS -- improving  - recommend IVA OS #5 as above   4,5. Hypertensive retinopathy OU - discussed importance of tight BP control - monitor  6. Mixed Cataract OU - The symptoms of cataract, surgical options, and treatments and risks were discussed with patient. - discussed diagnosis and progression - not yet visually significant - monitor for now  Ophthalmic Meds Ordered this visit:  Meds ordered this encounter  Medications   Bevacizumab (AVASTIN) SOLN 1.25 mg     Return for f/u 5-6 weeks NPDR OU, DFE, OCT.  There are no Patient Instructions on file for this visit.   Explained the diagnoses, plan, and follow up with the patient and they expressed understanding.  Patient expressed understanding of the importance of proper follow up care.   This document serves as a record of services personally performed by Gardiner Sleeper, MD, PhD. It was created on their behalf by Estill Bakes, COT an ophthalmic technician. The creation of this record is the provider's dictation and/or activities during the visit.    Electronically signed by: Estill Bakes, COT 10.19.22 @ 8:42 AM   This document serves as a record of services personally performed by Gardiner Sleeper, MD, PhD. It was created on their behalf by San Jetty. Owens Shark, OA an ophthalmic technician. The creation of this record is the provider's dictation and/or activities during the visit.    Electronically signed by: San Jetty. Owens Shark, [place] 10.25.2022 8:42 AM  Gardiner Sleeper, M.D., Ph.D. Diseases & Surgery of the Retina and Vitreous [facility]  I have reviewed the above documentation for accuracy and completeness, and I agree with the above. Gardiner Sleeper, M.D., Ph.D. 02/09/21 8:42 AM   Abbreviations: M myopia (nearsighted); A astigmatism; H hyperopia (farsighted); P presbyopia; Mrx spectacle prescription;  CTL contact lenses; OD right eye; OS left eye; OU both eyes  XT exotropia; ET esotropia; PEK punctate epithelial keratitis; PEE punctate epithelial erosions; DES dry eye syndrome; MGD meibomian gland dysfunction; ATs artificial tears; PFAT's preservative free artificial tears; [place] nuclear sclerotic cataract; PSC posterior subcapsular cataract; ERM epi-retinal membrane; PVD posterior vitreous detachment; RD retinal detachment; DM diabetes mellitus; DR diabetic retinopathy; NPDR non-proliferative diabetic retinopathy; PDR proliferative diabetic retinopathy; CSME clinically significant macular edema; DME diabetic macular edema; dbh dot blot hemorrhages; CWS cotton wool spot; POAG primary open angle glaucoma; C/D cup-to-disc ratio; HVF humphrey visual field; GVF goldmann visual field; OCT optical coherence tomography; IOP intraocular pressure; BRVO Branch retinal vein occlusion; CRVO central retinal vein occlusion; CRAO central retinal artery occlusion; BRAO branch retinal artery occlusion; RT retinal tear; SB scleral buckle; PPV pars plana vitrectomy; VH Vitreous hemorrhage; PRP panretinal laser photocoagulation; IVK intravitreal kenalog; VMT vitreomacular traction; MH Macular hole;  NVD neovascularization of the disc; NVE neovascularization elsewhere; AREDS age related eye disease study; ARMD age related macular degeneration; POAG primary open angle glaucoma; EBMD epithelial/anterior basement membrane dystrophy; ACIOL anterior chamber intraocular lens; IOL intraocular lens; PCIOL posterior chamber intraocular lens; Phaco/IOL phacoemulsification with intraocular lens placement; PRK photorefractive keratectomy; LASIK laser assisted in situ keratomileusis; HTN hypertension;  DM diabetes mellitus; COPD chronic obstructive pulmonary disease

## 2021-02-04 ENCOUNTER — Ambulatory Visit (INDEPENDENT_AMBULATORY_CARE_PROVIDER_SITE_OTHER): Payer: No Typology Code available for payment source | Admitting: Licensed Clinical Social Worker

## 2021-02-04 ENCOUNTER — Encounter (HOSPITAL_COMMUNITY): Payer: Self-pay | Admitting: Licensed Clinical Social Worker

## 2021-02-04 ENCOUNTER — Other Ambulatory Visit: Payer: Self-pay

## 2021-02-04 DIAGNOSIS — F1020 Alcohol dependence, uncomplicated: Secondary | ICD-10-CM

## 2021-02-04 NOTE — Progress Notes (Signed)
Virtual Visit via Phone Note   I connected with Shane Frazier, Jr. on 02/04/21 at 9:00 pm EDT by a phone-enabled telemedicine application and verified that I am speaking with the correct person using two identifiers.   I discussed the limitations of evaluation and management by telemedicine and the availability of in person appointments. The patient expressed understanding and agreed to proceed.   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 Pt has become a daily drinker, "I didn't used to drink like this."    Observations/Objective:  Pt presented for his therapy session. Pt reports on his drinking within the last week: "I haven't drank." Cln provided affirmations on his abstinence of alcohol for the week. CLn and pt explored his reasons for not drinking: "trying to make my marriage work, be more healthy, get more done when I'm not drinking." Cln used OARS to assist patient in learning how to remain abstinent.     Assessment and Plan: Counselor will continue to meet with patient to address treatment plan goals. Patient will continue to follow recommendations of providers and implement skills learned in session.       Follow Up Instructions: I discussed the assessment and treatment plan with the patient. The patient was provided an opportunity to ask questions and all were answered. The patient agreed with the plan and demonstrated an understanding of the instructions.   The patient was advised to call back or seek an in-person evaluation if the symptoms worsen or if the condition fails to improve as anticipated.   I provided 30 minutes of non-face-to-face time during this encounter.     Shanay Woolman S, LCAS

## 2021-02-08 ENCOUNTER — Other Ambulatory Visit: Payer: Self-pay

## 2021-02-08 ENCOUNTER — Other Ambulatory Visit: Payer: No Typology Code available for payment source

## 2021-02-08 DIAGNOSIS — Z0000 Encounter for general adult medical examination without abnormal findings: Secondary | ICD-10-CM

## 2021-02-08 DIAGNOSIS — I10 Essential (primary) hypertension: Secondary | ICD-10-CM

## 2021-02-08 DIAGNOSIS — E113293 Type 2 diabetes mellitus with mild nonproliferative diabetic retinopathy without macular edema, bilateral: Secondary | ICD-10-CM

## 2021-02-08 DIAGNOSIS — E1169 Type 2 diabetes mellitus with other specified complication: Secondary | ICD-10-CM

## 2021-02-08 DIAGNOSIS — R351 Nocturia: Secondary | ICD-10-CM

## 2021-02-08 DIAGNOSIS — E039 Hypothyroidism, unspecified: Secondary | ICD-10-CM

## 2021-02-08 DIAGNOSIS — E785 Hyperlipidemia, unspecified: Secondary | ICD-10-CM

## 2021-02-09 ENCOUNTER — Ambulatory Visit (INDEPENDENT_AMBULATORY_CARE_PROVIDER_SITE_OTHER): Payer: No Typology Code available for payment source | Admitting: Ophthalmology

## 2021-02-09 ENCOUNTER — Encounter (INDEPENDENT_AMBULATORY_CARE_PROVIDER_SITE_OTHER): Payer: Self-pay | Admitting: Ophthalmology

## 2021-02-09 DIAGNOSIS — H3581 Retinal edema: Secondary | ICD-10-CM

## 2021-02-09 DIAGNOSIS — H25813 Combined forms of age-related cataract, bilateral: Secondary | ICD-10-CM

## 2021-02-09 DIAGNOSIS — E113313 Type 2 diabetes mellitus with moderate nonproliferative diabetic retinopathy with macular edema, bilateral: Secondary | ICD-10-CM | POA: Diagnosis not present

## 2021-02-09 DIAGNOSIS — H35033 Hypertensive retinopathy, bilateral: Secondary | ICD-10-CM

## 2021-02-09 DIAGNOSIS — H35012 Changes in retinal vascular appearance, left eye: Secondary | ICD-10-CM | POA: Diagnosis not present

## 2021-02-09 DIAGNOSIS — I10 Essential (primary) hypertension: Secondary | ICD-10-CM | POA: Diagnosis not present

## 2021-02-09 LAB — CBC WITH DIFFERENTIAL/PLATELET
Absolute Monocytes: 441 cells/uL (ref 200–950)
Basophils Absolute: 39 cells/uL (ref 0–200)
Basophils Relative: 0.8 %
Eosinophils Absolute: 152 cells/uL (ref 15–500)
Eosinophils Relative: 3.1 %
HCT: 44 % (ref 38.5–50.0)
Hemoglobin: 14.4 g/dL (ref 13.2–17.1)
Lymphs Abs: 1460 cells/uL (ref 850–3900)
MCH: 29.3 pg (ref 27.0–33.0)
MCHC: 32.7 g/dL (ref 32.0–36.0)
MCV: 89.6 fL (ref 80.0–100.0)
MPV: 10.2 fL (ref 7.5–12.5)
Monocytes Relative: 9 %
Neutro Abs: 2808 cells/uL (ref 1500–7800)
Neutrophils Relative %: 57.3 %
Platelets: 266 10*3/uL (ref 140–400)
RBC: 4.91 10*6/uL (ref 4.20–5.80)
RDW: 13 % (ref 11.0–15.0)
Total Lymphocyte: 29.8 %
WBC: 4.9 10*3/uL (ref 3.8–10.8)

## 2021-02-09 LAB — COMPLETE METABOLIC PANEL WITH GFR
AG Ratio: 1.4 (calc) (ref 1.0–2.5)
ALT: 17 U/L (ref 9–46)
AST: 16 U/L (ref 10–35)
Albumin: 4 g/dL (ref 3.6–5.1)
Alkaline phosphatase (APISO): 61 U/L (ref 35–144)
BUN: 16 mg/dL (ref 7–25)
CO2: 30 mmol/L (ref 20–32)
Calcium: 9.3 mg/dL (ref 8.6–10.3)
Chloride: 103 mmol/L (ref 98–110)
Creat: 1.14 mg/dL (ref 0.70–1.30)
Globulin: 2.9 g/dL (calc) (ref 1.9–3.7)
Glucose, Bld: 137 mg/dL — ABNORMAL HIGH (ref 65–99)
Potassium: 4 mmol/L (ref 3.5–5.3)
Sodium: 140 mmol/L (ref 135–146)
Total Bilirubin: 0.5 mg/dL (ref 0.2–1.2)
Total Protein: 6.9 g/dL (ref 6.1–8.1)
eGFR: 76 mL/min/{1.73_m2} (ref >=60)

## 2021-02-09 LAB — LIPID PANEL
Cholesterol: 213 mg/dL — ABNORMAL HIGH (ref <200)
HDL: 70 mg/dL (ref >=40)
LDL Cholesterol (Calc): 128 mg/dL (calc) — ABNORMAL HIGH
Non-HDL Cholesterol (Calc): 143 mg/dL (calc) — ABNORMAL HIGH (ref <130)
Total CHOL/HDL Ratio: 3 (calc) (ref <5.0)
Triglycerides: 61 mg/dL (ref <150)

## 2021-02-09 LAB — T4, FREE: Free T4: 1 ng/dL (ref 0.8–1.8)

## 2021-02-09 LAB — HEMOGLOBIN A1C
Hgb A1c MFr Bld: 8.1 % of total Hgb — ABNORMAL HIGH (ref <5.7)
Mean Plasma Glucose: 186 mg/dL
eAG (mmol/L): 10.3 mmol/L

## 2021-02-09 LAB — PSA: PSA: 0.25 ng/mL (ref <=4.00)

## 2021-02-09 LAB — TSH: TSH: 19.05 mIU/L — ABNORMAL HIGH (ref 0.40–4.50)

## 2021-02-09 MED ORDER — BEVACIZUMAB CHEMO INJECTION 1.25MG/0.05ML SYRINGE FOR KALEIDOSCOPE
1.2500 mg | INTRAVITREAL | Status: AC | PRN
  Administered 2021-02-09: 1.25 mg via INTRAVITREAL

## 2021-02-10 ENCOUNTER — Encounter (HOSPITAL_COMMUNITY): Payer: Self-pay | Admitting: Licensed Clinical Social Worker

## 2021-02-10 ENCOUNTER — Other Ambulatory Visit: Payer: Self-pay

## 2021-02-10 ENCOUNTER — Ambulatory Visit (INDEPENDENT_AMBULATORY_CARE_PROVIDER_SITE_OTHER): Payer: No Typology Code available for payment source | Admitting: Licensed Clinical Social Worker

## 2021-02-10 DIAGNOSIS — F1020 Alcohol dependence, uncomplicated: Secondary | ICD-10-CM

## 2021-02-10 NOTE — Progress Notes (Addendum)
Virtual Visit via Phone Note   I connected with Shane Frazier, Jr. on 02/10/21 at 9:00 pm EDT by a phone-enabled telemedicine application and verified that I am speaking with the correct person using two identifiers.   I discussed the limitations of evaluation and management by telemedicine and the availability of in person appointments. The patient expressed understanding and agreed to proceed.   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 Pt has become a daily drinker, "I didn't used to drink like this."    Observations/Objective:  Pt presented for his therapy session. Pt reports on his drinking within the last week: "I haven't drank." Again, Cln provided affirmations on his abstinence of alcohol for the week. Again, CLn and pt explored his reasons for not drinking: "trying to make my marriage work, be more healthy, get more done when I'm not drinking." Cln used OARS to assist patient in learning how to remain abstinent. CLn and pt explored his marital issues, suggesting he and his wife go to marriage counseling.     Assessment and Plan: Counselor will continue to meet with patient to address treatment plan goals. Patient will continue to follow recommendations of providers and implement skills learned in session.       Follow Up Instructions: I discussed the assessment and treatment plan with the patient. The patient was provided an opportunity to ask questions and all were answered. The patient agreed with the plan and demonstrated an understanding of the instructions.   The patient was advised to call back or seek an in-person evaluation if the symptoms worsen or if the condition fails to improve as anticipated.   I provided 45  minutes of non-face-to-face time during this encounter.     Alford Gamero S, LCAS

## 2021-02-15 ENCOUNTER — Ambulatory Visit (INDEPENDENT_AMBULATORY_CARE_PROVIDER_SITE_OTHER): Payer: No Typology Code available for payment source | Admitting: Family Medicine

## 2021-02-15 ENCOUNTER — Encounter: Payer: No Typology Code available for payment source | Admitting: Family Medicine

## 2021-02-15 ENCOUNTER — Other Ambulatory Visit: Payer: Self-pay

## 2021-02-15 ENCOUNTER — Encounter: Payer: Self-pay | Admitting: Family Medicine

## 2021-02-15 VITALS — BP 134/70 | HR 87 | Ht 67.5 in | Wt 248.0 lb

## 2021-02-15 DIAGNOSIS — E1169 Type 2 diabetes mellitus with other specified complication: Secondary | ICD-10-CM | POA: Diagnosis not present

## 2021-02-15 DIAGNOSIS — E113293 Type 2 diabetes mellitus with mild nonproliferative diabetic retinopathy without macular edema, bilateral: Secondary | ICD-10-CM | POA: Diagnosis not present

## 2021-02-15 DIAGNOSIS — Z0000 Encounter for general adult medical examination without abnormal findings: Secondary | ICD-10-CM | POA: Diagnosis not present

## 2021-02-15 DIAGNOSIS — E785 Hyperlipidemia, unspecified: Secondary | ICD-10-CM

## 2021-02-15 DIAGNOSIS — G4733 Obstructive sleep apnea (adult) (pediatric): Secondary | ICD-10-CM

## 2021-02-15 DIAGNOSIS — Z9989 Dependence on other enabling machines and devices: Secondary | ICD-10-CM

## 2021-02-15 NOTE — Patient Instructions (Addendum)
Thank you for coming to the office today.  Keep up the great work overall.  Thyroid abnormal lab likely just due to not adhering to med, goal to restart and continue taking levothyroxine, we can recheck thyroid hormone in 3 months  Recent Labs    06/01/20 0844 09/10/20 1208 02/08/21 1011  HGBA1C 8.1* 8.6* 8.1*   Sugar improved, and we can recheck again  DUE for FASTING BLOOD WORK (no food or drink after midnight before the lab appointment, only water or coffee without cream/sugar on the morning of)  SCHEDULE "Lab Only" visit in the morning at the clinic for lab draw in 3 MONTHS   - Make sure Lab Only appointment is at about 1 week before your next appointment, so that results will be available  For Lab Results, once available within 2-3 days of blood draw, you can can log in to MyChart online to view your results and a brief explanation. Also, we can discuss results at next follow-up visit.   Please schedule a Follow-up Appointment to: Return in about 3 months (around 05/18/2021) for 3 month follow-up fasting lab only then 1 week later Follow-up DM, HTN, Thyroid labs.  If you have any other questions or concerns, please feel free to call the office or send a message through [place]. You may also schedule an earlier appointment if necessary.  Additionally, you may be receiving a survey about your experience at our office within a few days to 1 week by e-mail or mail. We value your feedback.  [name], DO [place]

## 2021-02-15 NOTE — Progress Notes (Signed)
Subjective:    Patient ID: Shane Picket., male    DOB: May 18, 1965, 55 y.o.   MRN: [id number]  Shane Frazier. is a 55 y.o. male presenting on 02/15/2021 for Annual Exam   HPI  Here for Annual Physical and Lab Review.   He admits previous trend with sugar was mostly due to increased alcohol and off medications, now reversing the trend.  Hypothyroidism Last lab showed elevated TSH now up to 19.05, and normal T4 1.0, (low normal T4), however patient admits that he was off levothyroxine 240mg daily.  CHRONIC DM, Type 2: Morbid Obesity BMI >38 Hyperlipidemia Improved overall. Last A1c had improved to 8.1 CBG avg 130-180, avg 145s, about 3 x week. Doing well on Ozempic CBGs: Home readings improved - ordered Freestyle Lite new testing supplies recently Meds: Ozempic 165mweekly injection, Metformin 50084mID Reports good compliance. 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 Denies hypoglycemia   CHRONIC HTN: Mild elevated, not always taking medication regularly. Current Meds - Lisinopril 11m26mily, Diltiazem LA 411mg104mly, HCTZ 25mg 48my Reports good compliance, took meds today. Tolerating well, w/o complaints. Denies CP, dyspnea, HA, edema, dizziness / lightheadedness   OSA, on CPAP Background information : History of witnessed sleep apnea events overnight, stopped breathing. Admits snoring overnight loudly. Has some muscle jerk and jumps in sleep. Prior history excessive daytime sleepiness. - he has completed [facility] sleep study series at [facility]- He has been using CPAP regularly and trying to improve this. - He does benefit from using it. He  feels less tired and sleepy during the day when he uses it consistently.  Alcohol Dependence Improved, reduced intake now. Working with behavioral health LisbetAlver Fisher  [place]epin[place]: Colonoscopy done 11/2018, next due 7 years  Declines Flu Shot today.  Depression screen PHQ 2/[facility]0/06/2020 10/12/2020 09/10/2019  Decreased Interest 0 0 0  Down, Depressed, Hopeless 0 0 0  PHQ - 2 Score 0 0 0  Some encounter information is confidential and restricted. Go to Review Flowsheets activity to see all data.    Past Medical History:  Diagnosis Date   CHF (congestive heart failure), NYHA class I, acute on chronic, systolic (HCC)  Campusoncussion 2017   cause of seizure after hit head at work   Difficult intubation    patient unaware of this diagnosis   ED (erectile dysfunction)    Hypothyroidism    Seizures (HCC) [id number]   X 1-PT HIT HIS HEAD AT WORK AND THEN HAD A SEIZURE   Past Surgical History:  Procedure Laterality Date   COLONOSCOPY WITH PROPOFOL N/A 12/07/2018   Procedure: COLONOSCOPY WITH PROPOFOL;  Surgeon: Anna, Jonathon Bellows [facility];  Service: Gastroenterology;  Laterality: N/A;   FLEXIBLE BRONCHOSCOPY N/A 03/27/2018   Procedure: [place];  Surgeon: SimondWilhelmina Mcardle Location: [facility] ORS;  Service: Pulmonary;  Laterality: N/A;   HERNIA REPAIR     as a child,umbilical   SHOULDER ARTHROSCOPY WITH ROTATOR CUFF REPAIR AND SUBACROMIAL DECOMPRESSION Left 09/09/2015   Procedure: SHOULDER ARTHROSCOPY, SUBACROMIAL DECOMPRESSION,BICEPS TENSON RELEASE AND MINI  OPEN ROTATOR CUFF REPAIR;  Surgeon: Leanor Kail, MD;  Location: [facility] ORS;  Service: Orthopedics;  Laterality: Left;   VENTRAL HERNIA REPAIR N/A 10/27/2017   Procedure: HERNIA REPAIR VENTRAL ADULT;  Surgeon: Jules Husbands, MD;  Location: [facility] ORS;  Service: General;  Laterality: N/A;   Social History   Socioeconomic History   Marital status: Married    Spouse  name: Not on file   Number of children: Not on file   Years of education: Not on file   Highest education level: Not on file  Occupational History   Not on file  Tobacco Use   Smoking status: Never   Smokeless tobacco: Never  Vaping Use   Vaping Use: Never used  Substance and Sexual Activity   Alcohol use: Yes    Alcohol/week: 1.0 standard drink    Types: 1 Standard drinks or equivalent per week    Comment: occasionally. rarely   Drug use: No   Sexual activity: Yes  Other Topics Concern   Not on file  Social History Narrative   Not on file   Social Determinants of Health   Financial Resource Strain: Not on file  Food Insecurity: Not on file  Transportation Needs: Not on file  Physical Activity: Not on file  Stress: Not on file  Social Connections: Not on file  Intimate Partner Violence: Not on file   Family History  Problem Relation Age of Onset   Hypertension Mother    Cancer Father    Diabetes Sister    Thyroid cancer Sister    Current Outpatient Medications on File Prior to Visit  Medication Sig   atorvastatin (LIPITOR) 20 MG tablet Take 1 tablet (20 mg total) by mouth at bedtime.   Blood Glucose Calibration (FREESTYLE CONTROL SOLUTION) LIQD Use to calibrate glucometer to check blood sugar   Blood Glucose Monitoring Suppl (FREESTYLE LITE) DEVI Use to check blood sugar up to 2 x daily   diclofenac Sodium (VOLTAREN) 1 % GEL Apply 2 g topically 3 (three) times daily as needed (left knee pain arthritis).   diltiazem (CARDIZEM LA) 420 MG 24 hr tablet TAKE 1 TABLET (420 MG TOTAL) BY MOUTH EVERY MORNING.   hydrochlorothiazide (HYDRODIURIL) 25 MG tablet Take 1 tablet (25 mg total) by mouth daily.   levothyroxine (SYNTHROID) 200 MCG tablet TAKE 1 TABLET BY MOUTH DAILY BEFORE BREAKFAST.   lisinopril (ZESTRIL) 20 MG tablet TAKE 1 TABLET BY MOUTH EVERY MORNING.   metFORMIN (GLUCOPHAGE) 500 MG tablet TAKE 1 TABLET (500 MG TOTAL) BY MOUTH 2 (TWO) TIMES DAILY WITH A MEAL.    neomycin-polymyxin-dexamethasone (MAXITROL) 0.1 % ophthalmic suspension Location: Right Eye. 1 GTT AFFECTED EYE QID X 7 DAYS, DAILY X 7 DAYS, STOP   OZEMPIC, 1 MG/DOSE, 4 MG/3ML SOPN INJECT 1 MG INTO THE SKIN ONCE A WEEK.   tadalafil (CIALIS) 20 MG tablet 1 tab po 1 hour prior to intercourse   No current facility-administered medications on file prior to visit.    Review of Systems  Constitutional:  Negative for activity change, appetite change, chills, diaphoresis, fatigue and fever.  HENT:  Negative for congestion and hearing loss.   Eyes:  Negative for visual disturbance.  Respiratory:  Negative for cough, chest tightness, shortness of breath and wheezing.   Cardiovascular:  Negative for chest pain, palpitations and leg swelling.  Gastrointestinal:  Negative for abdominal pain, constipation, diarrhea, nausea and vomiting.  Genitourinary:  Negative for dysuria, frequency and hematuria.  Musculoskeletal:  Negative for arthralgias  and neck pain.  Skin:  Negative for rash.  Neurological:  Negative for dizziness, weakness, light-headedness, numbness and headaches.  Hematological:  Negative for adenopathy.  Psychiatric/Behavioral:  Negative for behavioral problems, dysphoric mood and sleep disturbance.   Per HPI unless specifically indicated above     Objective:    BP 134/70 (BP Location: Left Arm, Cuff Size: Normal)   Pulse 87   Ht 5' 7.5" (1.715 m)   Wt 248 lb (112.5 kg)   SpO2 95%   BMI 38.27 kg/m   Wt Readings from Last 3 Encounters:  02/15/21 248 lb (112.5 kg)  01/18/21 242 lb 3.2 oz (109.9 kg)  12/22/20 245 lb 9.6 oz (111.4 kg)    Physical Exam Vitals and nursing note reviewed.  Constitutional:      General: He is not in acute distress.    Appearance: He is well-developed. He is not diaphoretic.     Comments: Well-appearing, comfortable, cooperative  HENT:     Head: Normocephalic and atraumatic.  Eyes:     General:        Right eye: No discharge.        Left eye: No  discharge.     Conjunctiva/sclera: Conjunctivae normal.     Pupils: Pupils are equal, round, and reactive to light.  Neck:     Thyroid: No thyromegaly.  Cardiovascular:     Rate and Rhythm: Normal rate and regular rhythm.     Pulses: Normal pulses.     Heart sounds: Normal heart sounds. No murmur heard. Pulmonary:     Effort: Pulmonary effort is normal. No respiratory distress.     Breath sounds: Normal breath sounds. No wheezing or rales.  Abdominal:     General: Bowel sounds are normal. There is no distension.     Palpations: Abdomen is soft. There is no mass.     Tenderness: There is no abdominal tenderness.  Musculoskeletal:        General: No tenderness. Normal range of motion.     Cervical back: Normal range of motion and neck supple.     Comments: Upper / Lower Extremities: - Normal muscle tone, strength bilateral upper extremities 5/5, lower extremities 5/5  Lymphadenopathy:     Cervical: No cervical adenopathy.  Skin:    General: Skin is warm and dry.     Findings: No erythema or rash.  Neurological:     Mental Status: He is alert and oriented to person, place, and time.     Comments: Distal sensation intact to light touch all extremities  Psychiatric:        Mood and Affect: Mood normal.        Behavior: Behavior normal.        Thought Content: Thought content normal.     Comments: Well groomed, good eye contact, normal speech and thoughts      Results for orders placed or performed in visit on 02/08/21  T4, free  Result Value Ref Range   Free T4 1.0 0.8 - 1.8 ng/dL  Lipid panel  Result Value Ref Range   Cholesterol 213 (H) <200 mg/dL   HDL 70 > OR = 40 mg/dL   Triglycerides 61 <150 mg/dL   LDL Cholesterol (Calc) 128 (H) mg/dL (calc)   Total CHOL/HDL Ratio 3.0 <5.0 (calc)   Non-HDL Cholesterol (Calc) 143 (H) <130 mg/dL (calc)  COMPLETE METABOLIC PANEL WITH GFR  Result Value Ref Range   Glucose, Bld 137 (H) 65 - 99 mg/dL  BUN 16 7 - 25 mg/dL   Creat  1.14 0.70 - 1.30 mg/dL   eGFR 76 > OR = 60 mL/min/1.69m   BUN/Creatinine Ratio NOT APPLICABLE 6 - 22 (calc)   Sodium 140 135 - 146 mmol/L   Potassium 4.0 3.5 - 5.3 mmol/L   Chloride 103 98 - 110 mmol/L   CO2 30 20 - 32 mmol/L   Calcium 9.3 8.6 - 10.3 mg/dL   Total Protein 6.9 6.1 - 8.1 g/dL   Albumin 4.0 3.6 - 5.1 g/dL   Globulin 2.9 1.9 - 3.7 g/dL (calc)   AG Ratio 1.4 1.0 - 2.5 (calc)   Total Bilirubin 0.5 0.2 - 1.2 mg/dL   Alkaline phosphatase (APISO) 61 35 - 144 U/L   AST 16 10 - 35 U/L   ALT 17 9 - 46 U/L  Hemoglobin A1c  Result Value Ref Range   Hgb A1c MFr Bld 8.1 (H) <5.7 % of total Hgb   Mean Plasma Glucose 186 mg/dL   eAG (mmol/L) 10.3 mmol/L  TSH  Result Value Ref Range   TSH 19.05 (H) 0.40 - 4.50 mIU/L  CBC with Differential/Platelet  Result Value Ref Range   WBC 4.9 3.8 - 10.8 Thousand/uL   RBC 4.91 4.20 - 5.80 Million/uL   Hemoglobin 14.4 13.2 - 17.1 g/dL   HCT 44.0 38.5 - 50.0 %   MCV 89.6 80.0 - 100.0 fL   MCH 29.3 27.0 - 33.0 pg   MCHC 32.7 32.0 - 36.0 g/dL   RDW 13.0 11.0 - 15.0 %   Platelets 266 140 - 400 Thousand/uL   MPV 10.2 7.5 - 12.5 fL   Neutro Abs 2,808 1,500 - 7,800 cells/uL   Lymphs Abs 1,460 850 - 3,900 cells/uL   Absolute Monocytes 441 200 - 950 cells/uL   Eosinophils Absolute 152 15 - 500 cells/uL   Basophils Absolute 39 0 - 200 cells/uL   Neutrophils Relative % 57.3 %   Total Lymphocyte 29.8 %   Monocytes Relative 9.0 %   Eosinophils Relative 3.1 %   Basophils Relative 0.8 %  PSA  Result Value Ref Range   PSA 0.25 < OR = 4.00 ng/mL      Assessment & Plan:   Problem List Items Addressed This Visit     Type 2 diabetes mellitus with diabetic retinopathy ([place]   OSA on CPAP   Hyperlipidemia associated with type 2 diabetes mellitus ([place]   Other Visit Diagnoses     Annual physical exam    -  Primary       Updated Health Maintenance information Declines Vaccines Colonoscopy next due in 11/2025, last 11/2018, due in 7  years PSA negative Reviewed recent lab results with patient Encouraged improvement to lifestyle with diet and exercise Goal of weight loss  improving med adherence Reducing alcohol Working with therapist Likely TSH elevated due to med non adherence, now back on med - no dose change yet, will repeat in 3 months A1c improving to 8.1, will repeat in 3 months. BP improved on repeat  OSA on CPAP continues to adhere to therapy, benefits   No orders of the defined types were placed in this encounter.     Follow up plan: Return in about 3 months (around 05/18/2021) for 3 month follow-up fasting lab only then 1 week later Follow-up DM, HTN, Thyroid labs.  ANobie Putnam DO [facility] 02/15/2021, 11:29 AM

## 2021-02-17 ENCOUNTER — Other Ambulatory Visit: Payer: Self-pay

## 2021-02-17 ENCOUNTER — Ambulatory Visit (HOSPITAL_COMMUNITY): Payer: Self-pay | Admitting: Licensed Clinical Social Worker

## 2021-02-22 ENCOUNTER — Other Ambulatory Visit: Payer: Self-pay | Admitting: Family Medicine

## 2021-02-22 DIAGNOSIS — E113293 Type 2 diabetes mellitus with mild nonproliferative diabetic retinopathy without macular edema, bilateral: Secondary | ICD-10-CM

## 2021-02-23 ENCOUNTER — Other Ambulatory Visit: Payer: Self-pay

## 2021-02-23 MED ORDER — OZEMPIC (1 MG/DOSE) 4 MG/3ML ~~LOC~~ SOPN
1.0000 mg | PEN_INJECTOR | SUBCUTANEOUS | 1 refills | Status: DC
  Filled 2021-02-23: qty 6, 56d supply, fill #0

## 2021-02-23 NOTE — Telephone Encounter (Signed)
Requested Prescriptions  Pending Prescriptions Disp Refills  . OZEMPIC, 1 MG/DOSE, 4 MG/3ML SOPN 3 mL 1    Sig: INJECT 1 MG INTO THE SKIN ONCE A WEEK.     Endocrinology:  Diabetes - GLP-1 Receptor Agonists Failed - 02/22/2021 11:07 PM      Failed - HBA1C is between 0 and 7.9 and within 180 days    Hemoglobin A1C  Date Value Ref Range Status  10/17/2017 9.8  Final   Hgb A1c MFr Bld  Date Value Ref Range Status  02/08/2021 8.1 (H) <5.7 % of total Hgb Final    Comment:    For someone without known diabetes, a hemoglobin A1c value of 6.5% or greater indicates that they may have  diabetes and this should be confirmed with a follow-up  test. . For someone with known diabetes, a value <7% indicates  that their diabetes is well controlled and a value  greater than or equal to 7% indicates suboptimal  control. A1c targets should be individualized based on  duration of diabetes, age, comorbid conditions, and  other considerations. . Currently, no consensus exists regarding use of hemoglobin A1c for diagnosis of diabetes for children. Renella Cunas - Valid encounter within last 6 months    Recent Outpatient Visits          1 week ago Annual physical exam   [place], DO   1 month ago Alcohol dependence in early full remission [facility])   [place], DO   2 months ago Alcohol dependence in early full remission [facility])   [place], DO   4 months ago Essential hypertension   [place], DO   7 months ago Encounter for Department of Transportation (DOT) examination for trucking license   [facility] Kathrine Haddock, NP      Future Appointments            In 2 months [place], Devonne Doughty, [facility], [facility]

## 2021-02-25 ENCOUNTER — Other Ambulatory Visit: Payer: Self-pay

## 2021-03-02 ENCOUNTER — Other Ambulatory Visit: Payer: Self-pay

## 2021-03-02 ENCOUNTER — Encounter (HOSPITAL_COMMUNITY): Payer: Self-pay | Admitting: Licensed Clinical Social Worker

## 2021-03-02 ENCOUNTER — Ambulatory Visit (INDEPENDENT_AMBULATORY_CARE_PROVIDER_SITE_OTHER): Payer: No Typology Code available for payment source | Admitting: Licensed Clinical Social Worker

## 2021-03-02 DIAGNOSIS — F1020 Alcohol dependence, uncomplicated: Secondary | ICD-10-CM | POA: Diagnosis not present

## 2021-03-02 NOTE — Progress Notes (Signed)
Virtual Visit via Phone Note   I connected with Shane Frazier, Jr. on 03/02/21 at 9:00 pm EDT by a phone-enabled telemedicine application and verified that I am speaking with the correct person using two identifiers.   I discussed the limitations of evaluation and management by telemedicine and the availability of in person appointments. The patient expressed understanding and agreed to proceed.   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 Pt has become a daily drinker, "I didn't used to drink like this."    Observations/Objective:  Pt presented for his therapy session. Pt reports on his drinking within the last week: "I haven't drank." Again, Cln provided affirmations on his use of alcohol for the week. " I had 1 drink Saturday, I was bored." Again, CLn and pt explored his reasons for not drinking: "trying to make my marriage work, be more healthy, get more done when I'm not drinking." Cln used MI-OARS to assist patient in learning how to achieve abstinence. CLn and pt explored his continued marital issues: communication errors. Cln provided education on communication skills: making an appointment for a conversation.     Assessment and Plan: Counselor will continue to meet with patient to address treatment plan goals. Patient will continue to follow recommendations of providers and implement skills learned in session.       Follow Up Instructions: I discussed the assessment and treatment plan with the patient. The patient was provided an opportunity to ask questions and all were answered. The patient agreed with the plan and demonstrated an understanding of the instructions.   The patient was advised to call back or seek an in-person  evaluation if the symptoms worsen or if the condition fails to improve as anticipated.   I provided 45 minutes of non-face-to-face time during this encounter.     Evea Sheek S, LCAS

## 2021-03-09 ENCOUNTER — Encounter (HOSPITAL_COMMUNITY): Payer: Self-pay | Admitting: Licensed Clinical Social Worker

## 2021-03-09 ENCOUNTER — Telehealth (HOSPITAL_COMMUNITY): Payer: Self-pay | Admitting: Licensed Clinical Social Worker

## 2021-03-09 ENCOUNTER — Ambulatory Visit (INDEPENDENT_AMBULATORY_CARE_PROVIDER_SITE_OTHER): Payer: No Typology Code available for payment source | Admitting: Licensed Clinical Social Worker

## 2021-03-09 ENCOUNTER — Other Ambulatory Visit: Payer: Self-pay

## 2021-03-09 DIAGNOSIS — F1020 Alcohol dependence, uncomplicated: Secondary | ICD-10-CM | POA: Diagnosis not present

## 2021-03-09 NOTE — Progress Notes (Signed)
Virtual Visit via Phone Note   I connected with Shane Frazier, Shane Frazier. on 03/09/21 at 9:15 pm EDT by a phone-enabled telemedicine application and verified that I am speaking with the correct person using two identifiers.   I discussed the limitations of evaluation and management by telemedicine and the availability of in person appointments. The patient expressed understanding and agreed to proceed.   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 Pt has become a daily drinker, "I didn't used to drink like this."    Observations/Objective:  Pt presented for his therapy session. Pt reports on his drinking within the last week: "I haven't drank." Again, Cln provided affirmations on his non-use of alcohol for the week.  Again, CLn and pt explored his reasons for not drinking: "trying to make my marriage work, be more healthy, get more done when I'm not drinking." CLn and pt explored his continued marital issues: communication errors. Cln provided education on communication skills: making an appointment for a conversation. Pt reports he is still trying to decide whether he wants to move forward in his marriage. Cln suggested pt make a list of pros/cons of his marriage.     Assessment and Plan: Counselor will continue to meet with patient to address treatment plan goals. Patient will continue to follow recommendations of providers and implement skills learned in session.       Follow Up Instructions: I discussed the assessment and treatment plan with the patient. The patient was provided an opportunity to ask questions and all were answered. The patient agreed with the plan and demonstrated an understanding of the instructions.   The patient was  advised to call back or seek an in-person evaluation if the symptoms worsen or if the condition fails to improve as anticipated.   I provided 30 minutes of non-face-to-face time during this encounter.     Sybrina Laning S, LCAS

## 2021-03-09 NOTE — Progress Notes (Signed)
[facility] Note  03/15/2021     CHIEF COMPLAINT Patient presents for Retina Follow Up   HISTORY OF PRESENT ILLNESS: Shane Frazier. is a 55 y.o. male who presents to the clinic today for:   HPI     Retina Follow Up   Patient presents with  Diabetic Retinopathy.  In both eyes.  Duration of 5 weeks.  Since onset it is stable.  I, the attending physician,  performed the HPI with the patient and updated documentation appropriately.        Comments   5 week follow up NPDR OU-  Vision stable since last visit OU.   BS 141 yesterday, A1C 8.1      Last edited by Bernarda Caffey, MD on 03/15/2021 11:32 AM.    Pt states vision about the same   Referring physician: Olin Hauser, DO [place],  [place] [address]   HISTORICAL INFORMATION:   Selected notes from the MEDICAL RECORD NUMBER Referred by Dr. Ellin Mayhew LEE: 06.22.2022 Ocular Hx- NPDR OU, Mac edema OS, CSR OS BCVA OD 20/20, OS 20/40   CURRENT MEDICATIONS: Current Outpatient Medications (Ophthalmic Drugs)  Medication Sig   neomycin-polymyxin-dexamethasone (MAXITROL) 0.1 % ophthalmic suspension Location: Right Eye. 1 GTT AFFECTED EYE QID X 7 DAYS, DAILY X 7 DAYS, STOP (Patient not taking: Reported on 03/15/2021)   No current facility-administered medications for this visit. (Ophthalmic Drugs)   Current Outpatient Medications (Other)  Medication Sig   atorvastatin (LIPITOR) 20 MG tablet Take 1 tablet (20 mg total) by mouth at bedtime.   diltiazem (CARDIZEM LA) 420 MG 24 hr tablet TAKE 1 TABLET (420 MG TOTAL) BY MOUTH EVERY MORNING.   hydrochlorothiazide (HYDRODIURIL) 25 MG tablet Take 1 tablet (25 mg total) by mouth daily.   levothyroxine (SYNTHROID) 200 MCG tablet TAKE 1 TABLET BY MOUTH DAILY BEFORE BREAKFAST.   lisinopril (ZESTRIL) 20 MG tablet TAKE 1 TABLET BY MOUTH EVERY MORNING.   metFORMIN (GLUCOPHAGE) 500 MG tablet TAKE 1 TABLET (500 MG TOTAL) BY MOUTH 2 (TWO) TIMES  DAILY WITH A MEAL.   OZEMPIC, 1 MG/DOSE, 4 MG/3ML SOPN INJECT 1 MG INTO THE SKIN ONCE A WEEK.   tadalafil (CIALIS) 20 MG tablet 1 tab po 1 hour prior to intercourse   Blood Glucose Calibration (FREESTYLE CONTROL SOLUTION) LIQD Use to calibrate glucometer to check blood sugar   Blood Glucose Monitoring Suppl (FREESTYLE LITE) DEVI Use to check blood sugar up to 2 x daily   diclofenac Sodium (VOLTAREN) 1 % GEL Apply 2 g topically 3 (three) times daily as needed (left knee pain arthritis). (Patient not taking: Reported on 03/15/2021)   No current facility-administered medications for this visit. (Other)   REVIEW OF SYSTEMS: ROS   Positive for: Neurological, Musculoskeletal, Endocrine, Cardiovascular, Eyes, Respiratory Negative for: Constitutional, Gastrointestinal, Skin, Genitourinary, HENT, Psychiatric, Allergic/Imm, Heme/Lymph Last edited by Leonie Douglas, COA on 03/15/2021  9:03 AM.       ALLERGIES Allergies  Allergen Reactions   Other Other (See Comments)    BRAZILIAN NUTS ONLY-"THROAT CLOSES UP"    PAST MEDICAL HISTORY Past Medical History:  Diagnosis Date   CHF (congestive heart failure), NYHA class I, acute on chronic, systolic ([place])    Concussion 2017   cause of seizure after hit head at work   Difficult intubation    patient unaware of this diagnosis   ED (erectile dysfunction)    Hypothyroidism    Seizures ([place]) 05-20-15  X 1-PT HIT HIS HEAD AT WORK AND THEN HAD A SEIZURE   Past Surgical History:  Procedure Laterality Date   COLONOSCOPY WITH PROPOFOL N/A 12/07/2018   Procedure: COLONOSCOPY WITH PROPOFOL;  Surgeon: Jonathon Bellows, MD;  Location: [facility] ENDOSCOPY;  Service: Gastroenterology;  Laterality: N/A;   FLEXIBLE BRONCHOSCOPY N/A 03/27/2018   Procedure: [place] With C-arm;  Surgeon: [name], MD;  Location: [facility] ORS;  Service: Pulmonary;  Laterality: N/A;   HERNIA REPAIR     as a child,umbilical   SHOULDER ARTHROSCOPY WITH ROTATOR  CUFF REPAIR AND SUBACROMIAL DECOMPRESSION Left 09/09/2015   Procedure: SHOULDER ARTHROSCOPY, SUBACROMIAL DECOMPRESSION,BICEPS TENSON RELEASE AND MINI OPEN ROTATOR CUFF REPAIR;  Surgeon: Leanor Kail, MD;  Location: [facility] ORS;  Service: Orthopedics;  Laterality: Left;   VENTRAL HERNIA REPAIR N/A 10/27/2017   Procedure: HERNIA REPAIR VENTRAL ADULT;  Surgeon: Jules Husbands, MD;  Location: [facility] ORS;  Service: General;  Laterality: N/A;    FAMILY HISTORY Family History  Problem Relation Age of Onset   Hypertension Mother    Cancer Father    Diabetes Sister    Thyroid cancer Sister     SOCIAL HISTORY Social History   Tobacco Use   Smoking status: Never   Smokeless tobacco: Current    Types: Chew  Vaping Use   Vaping Use: Never used  Substance Use Topics   Alcohol use: Yes    Alcohol/week: 1.0 standard drink    Types: 1 Standard drinks or equivalent per week    Comment: occasionally. rarely   Drug use: No       OPHTHALMIC EXAM:  Base Eye Exam     Visual Acuity (Snellen - Linear)       Right Left   Dist cc 20/20 20/20    Correction: Glasses         Tonometry (Tonopen, 9:08 AM)       Right Left   Pressure 18 21         Pupils       Dark Light Shape React APD   Right 2 1 Round Minimal None   Left 2 1 Round Minimal None         Visual Fields (Counting fingers)       Left Right    Full Full         Extraocular Movement       Right Left    Full Full         Neuro/Psych     Oriented x3: Yes   Mood/Affect: Normal         Dilation     Both eyes: 1.0% Mydriacyl, 2.5% Phenylephrine @ 9:08 AM           Slit Lamp and Fundus Exam     Slit Lamp Exam       Right Left   Lids/Lashes Dermatochalasis - upper lid Dermatochalasis - upper lid   Conjunctiva/Sclera nasal and temporal pinguecula nasal and temporal pinguecula   Cornea arcus arcus   Anterior Chamber Deep and quiet Deep and quiet   Iris Round and dilated, No NVI Round and  dilated, No NVI   Lens 2+ Nuclear sclerosis, 2+ Cortical cataract 2+ Nuclear sclerosis, 2+ Cortical cataract   Anterior Vitreous Vitreous syneresis Vitreous syneresis         Fundus Exam       Right Left   Disc Pink and Sharp Pink and Sharp, +cupping   C/D Ratio  0.6 0.6   Macula Flat, Good foveal reflex, scattered Microaneurysms, focal Exudates superior macula, trace cystic changes non-central--improved Good foveal reflex, cluster of fine exudates and edema IN macula, surrounding macroaneurysm -- all improving; CWS/RAM along IT arteriorle improving   Vessels attenuated, Tortuous attenuated, Tortuous, Copper wiring, +RAM IN macula, severe attenuation temporal periphery   Periphery Attached, scattered MA/DBH    Attached, scattered MA           Refraction     Wearing Rx       Sphere Cylinder Axis   Right -2.25 +1.00 077   Left -2.00 +1.00 121            IMAGING AND PROCEDURES  Imaging and Procedures for 03/15/2021  OCT, Retina - OU - Both Eyes       Right Eye Quality was good. Central Foveal Thickness: 272. Progression has improved. Findings include normal foveal contour, no SRF, intraretinal hyper-reflective material, vitreomacular adhesion , intraretinal fluid (Mild interval improvement in cystic changes).   Left Eye Quality was good. Central Foveal Thickness: 252. Progression has improved. Findings include intraretinal hyper-reflective material, normal foveal contour, no SRF, no IRF (Mild interval improvement in [facility] and cystic changes along IT arcades).   Notes *Images captured and stored on drive  Diagnosis / Impression:  OD: Mild interval improvement in cystic changes OS: Mild interval improvement in [facility] and cystic changes along IT arcades  Clinical management:  See below  Abbreviations: NFP - Normal foveal profile. CME - cystoid macular edema. PED - pigment epithelial detachment. IRF - intraretinal fluid. SRF - subretinal fluid. EZ - ellipsoid zone. ERM -  epiretinal membrane. ORA - outer retinal atrophy. ORT - outer retinal tubulation. SRHM - subretinal hyper-reflective material. IRHM - intraretinal hyper-reflective material      Intravitreal Injection, Pharmacologic Agent - OS - Left Eye       Time Out 03/15/2021. 9:33 AM. Confirmed correct patient, procedure, site, and patient consented.   Anesthesia Topical anesthesia was used. Anesthetic medications included Lidocaine 2%, Proparacaine 0.5%.   Procedure Preparation included 5% betadine to ocular surface, eyelid speculum. A (32 g) needle was used.   Injection: 1.25 mg Bevacizumab 1.60m/0.05ml   Route: Intravitreal, Site: Left Eye   NDC:: 29798-921-19 Lot: [id number], Expiration date: 04/21/2021, Waste: 0.05 mL   Post-op Post injection exam found visual acuity of at least counting fingers. The patient tolerated the procedure well. There were no complications. The patient received written and verbal post procedure care education. Post injection medications were not given.              ASSESSMENT/PLAN:    ICD-10-CM   1. Moderate nonproliferative diabetic retinopathy of both eyes with macular edema associated with type 2 diabetes mellitus (HCC)  EE17.4081Intravitreal Injection, Pharmacologic Agent - OS - Left Eye    Bevacizumab (AVASTIN) SOLN 1.25 mg    2. Retinal edema  H35.81 OCT, Retina - OU - Both Eyes    3. Retinal macroaneurysm, left eye  H35.012     4. Essential hypertension  I10     5. Hypertensive retinopathy of both eyes  H35.033     6. Combined forms of age-related cataract of both eyes  H25.813       1,2. Moderate non-proliferative diabetic retinopathy, both eyes  - s/p IVA OS #1 (06.27.22), #2 (07.25.22), #3 (08.30.22; JDM), #4 (9.27.22), #5(10.25.22) - FA (06.27.22) shows leaking MA; no NV OU; OS -- focal RAM along IT arteriole with blockage and leakage -  OCT shows OD: mild interval improvement in cystic changes; OS: mild interval improvement in [facility] and  cystic changes along IT arcades - BCVA 20/20 OD, 20/20 OS (stably improved OS) - recommend IVA OS #6 today, 11.28.22 for DME - pt wishes to proceed with injection - RBA of procedure discussed, questions answered - informed consent obtained and signed - see procedure note - f/u in 6 wks -- DFE/OCT, possible injection  3. Retinal arterial macroaneurysm w/ macular edema OS -- improving  - recommend IVA OS #6 as above   4,5. Hypertensive retinopathy OU - discussed importance of tight BP control - monitor  6. Mixed Cataract OU - The symptoms of cataract, surgical options, and treatments and risks were discussed with patient. - discussed diagnosis and progression - not yet visually significant - monitor for now  Ophthalmic Meds Ordered this visit:  Meds ordered this encounter  Medications   Bevacizumab (AVASTIN) SOLN 1.25 mg     Return in about 6 weeks (around 04/26/2021) for DFE, OCT.  There are no Patient Instructions on file for this visit.   Explained the diagnoses, plan, and follow up with the patient and they expressed understanding.  Patient expressed understanding of the importance of proper follow up care.   This document serves as a record of services personally performed by Gardiner Sleeper, MD, PhD. It was created on their behalf by Roselee Nova, COMT. The creation of this record is the provider's dictation and/or activities during the visit.  Electronically signed by: Roselee Nova, COMT 03/15/21 11:36 AM  This document serves as a record of services personally performed by Gardiner Sleeper, MD, PhD. It was created on their behalf by San Jetty. Owens Shark, OA an ophthalmic technician. The creation of this record is the provider's dictation and/or activities during the visit.    Electronically signed by: San Jetty. Owens Shark, [place] 11.28.2022 11:36 AM  Gardiner Sleeper, M.D., Ph.D. Diseases & Surgery of the Retina and Vitreous [facility]  I have reviewed the  above documentation for accuracy and completeness, and I agree with the above. Gardiner Sleeper, M.D., Ph.D. 03/15/21 11:36 AM  Abbreviations: M myopia (nearsighted); A astigmatism; H hyperopia (farsighted); P presbyopia; Mrx spectacle prescription;  CTL contact lenses; OD right eye; OS left eye; OU both eyes  XT exotropia; ET esotropia; PEK punctate epithelial keratitis; PEE punctate epithelial erosions; DES dry eye syndrome; MGD meibomian gland dysfunction; ATs artificial tears; PFAT's preservative free artificial tears; [place] nuclear sclerotic cataract; PSC posterior subcapsular cataract; ERM epi-retinal membrane; PVD posterior vitreous detachment; RD retinal detachment; DM diabetes mellitus; DR diabetic retinopathy; NPDR non-proliferative diabetic retinopathy; PDR proliferative diabetic retinopathy; CSME clinically significant macular edema; DME diabetic macular edema; dbh dot blot hemorrhages; CWS cotton wool spot; POAG primary open angle glaucoma; C/D cup-to-disc ratio; HVF humphrey visual field; GVF goldmann visual field; OCT optical coherence tomography; IOP intraocular pressure; BRVO Branch retinal vein occlusion; CRVO central retinal vein occlusion; CRAO central retinal artery occlusion; BRAO branch retinal artery occlusion; RT retinal tear; SB scleral buckle; PPV pars plana vitrectomy; VH Vitreous hemorrhage; PRP panretinal laser photocoagulation; IVK intravitreal kenalog; VMT vitreomacular traction; MH Macular hole;  NVD neovascularization of the disc; NVE neovascularization elsewhere; AREDS age related eye disease study; ARMD age related macular degeneration; POAG primary open angle glaucoma; EBMD epithelial/anterior basement membrane dystrophy; ACIOL anterior chamber intraocular lens; IOL intraocular lens; PCIOL posterior chamber intraocular lens; Phaco/IOL phacoemulsification with intraocular lens placement; PRK photorefractive keratectomy; LASIK laser assisted  in situ keratomileusis; HTN  hypertension; DM diabetes mellitus; COPD chronic obstructive pulmonary disease

## 2021-03-15 ENCOUNTER — Other Ambulatory Visit: Payer: Self-pay

## 2021-03-15 ENCOUNTER — Encounter (INDEPENDENT_AMBULATORY_CARE_PROVIDER_SITE_OTHER): Payer: Self-pay | Admitting: Ophthalmology

## 2021-03-15 ENCOUNTER — Ambulatory Visit (INDEPENDENT_AMBULATORY_CARE_PROVIDER_SITE_OTHER): Payer: No Typology Code available for payment source | Admitting: Ophthalmology

## 2021-03-15 DIAGNOSIS — I10 Essential (primary) hypertension: Secondary | ICD-10-CM

## 2021-03-15 DIAGNOSIS — E113313 Type 2 diabetes mellitus with moderate nonproliferative diabetic retinopathy with macular edema, bilateral: Secondary | ICD-10-CM | POA: Diagnosis not present

## 2021-03-15 DIAGNOSIS — H35012 Changes in retinal vascular appearance, left eye: Secondary | ICD-10-CM | POA: Diagnosis not present

## 2021-03-15 DIAGNOSIS — H3581 Retinal edema: Secondary | ICD-10-CM

## 2021-03-15 DIAGNOSIS — H35033 Hypertensive retinopathy, bilateral: Secondary | ICD-10-CM

## 2021-03-15 DIAGNOSIS — H25813 Combined forms of age-related cataract, bilateral: Secondary | ICD-10-CM

## 2021-03-15 MED ORDER — BEVACIZUMAB CHEMO INJECTION 1.25MG/0.05ML SYRINGE FOR KALEIDOSCOPE
1.2500 mg | INTRAVITREAL | Status: AC | PRN
  Administered 2021-03-15: 12:00:00 1.25 mg via INTRAVITREAL

## 2021-03-16 ENCOUNTER — Encounter (INDEPENDENT_AMBULATORY_CARE_PROVIDER_SITE_OTHER): Payer: No Typology Code available for payment source | Admitting: Ophthalmology

## 2021-03-18 ENCOUNTER — Other Ambulatory Visit: Payer: Self-pay

## 2021-03-18 ENCOUNTER — Encounter (HOSPITAL_COMMUNITY): Payer: Self-pay | Admitting: Licensed Clinical Social Worker

## 2021-03-18 ENCOUNTER — Ambulatory Visit (INDEPENDENT_AMBULATORY_CARE_PROVIDER_SITE_OTHER): Payer: No Typology Code available for payment source | Admitting: Licensed Clinical Social Worker

## 2021-03-18 DIAGNOSIS — F1020 Alcohol dependence, uncomplicated: Secondary | ICD-10-CM | POA: Diagnosis not present

## 2021-03-18 NOTE — Progress Notes (Signed)
Virtual Visit via Phone Note   I connected with Shane Frazier, Shane Frazier. on 03/09/21 at 9:15 pm EDT by a phone-enabled telemedicine application and verified that I am speaking with the correct person using two identifiers.   I discussed the limitations of evaluation and management by telemedicine and the availability of in person appointments. The patient expressed understanding and agreed to proceed.   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 Pt has become a daily drinker, "I didn't used to drink like this."    Observations/Objective:  Pt presented for his therapy session. Pt reports on his drinking within the last week: "I had 2 drinks since  last session." Cln used CBT to help process his use of alcohol within the last week. Cln and pt identified his triggers to use. Cln utilized MI OARS to develop discrepancy. Again, CLn and pt explored his reasons for not drinking: "trying to make my marriage work, be more healthy, get more done when I'm not drinking." Pt reports he is still trying to decide whether he wants to move forward in his marriage. Cln and pt explored pros/cons of his marriage.     Assessment and Plan: Counselor will continue to meet with patient to address treatment plan goals. Patient will continue to follow recommendations of providers and implement skills learned in session.       Follow Up Instructions: I discussed the assessment and treatment plan with the patient. The patient was provided an opportunity to ask questions and all were answered. The patient agreed with the plan and demonstrated an understanding of the instructions.   The patient was advised to call back or seek an in-person evaluation if the symptoms  worsen or if the condition fails to improve as anticipated.   I provided 30 minutes of non-face-to-face time during this encounter.     Venola Castello S, LCAS

## 2021-03-25 ENCOUNTER — Encounter (HOSPITAL_COMMUNITY): Payer: Self-pay | Admitting: Licensed Clinical Social Worker

## 2021-03-25 ENCOUNTER — Other Ambulatory Visit: Payer: Self-pay

## 2021-03-25 ENCOUNTER — Ambulatory Visit (INDEPENDENT_AMBULATORY_CARE_PROVIDER_SITE_OTHER): Payer: No Typology Code available for payment source | Admitting: Licensed Clinical Social Worker

## 2021-03-25 DIAGNOSIS — F1020 Alcohol dependence, uncomplicated: Secondary | ICD-10-CM | POA: Diagnosis not present

## 2021-03-25 NOTE — Progress Notes (Signed)
Virtual Visit via Phone Note   I connected with Shane Frazier, Jr. on 04/04/21 at 9:00 am EST by a phone-enabled telemedicine application and verified that I am speaking with the correct person using two identifiers.   I discussed the limitations of evaluation and management by telemedicine and the availability of in person appointments. The patient expressed understanding and agreed to proceed.   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 Pt has become a daily drinker, "I didn't used to drink like this."    Observations/Objective:  Pt presented for his therapy session. Pt reports on his drinking within the last week: "I didn't drink within the last week."  Cln utilized CBT to process his triggers to drink. Again, Cln utilized MI OARS to develop discrepancy. Again, CLn and pt explored his reasons for not drinking: "trying to make my marriage work, be more healthy, get more done when I'm not drinking." Pt reports he and his wife have started a discussion on issues in their marriage. Cln reached out to Shane Frazier at [facility] requesting a referral Shane Frazier based marriage counselor.     Assessment and Plan: Counselor will continue to meet with patient to address treatment plan goals. Patient will continue to follow recommendations of providers and implement skills learned in session.       Follow Up Instructions: I discussed the assessment and treatment plan with the patient. The patient was provided an opportunity to ask questions and all were answered. The patient agreed with the plan and demonstrated an understanding of the instructions.   The patient was advised to call back or seek an in-person evaluation if the symptoms worsen or if the  condition fails to improve as anticipated.   I provided 30 minutes of non-face-to-face time during this encounter.     Shane Frazier, LCAS

## 2021-04-01 ENCOUNTER — Other Ambulatory Visit: Payer: Self-pay

## 2021-04-01 ENCOUNTER — Ambulatory Visit (INDEPENDENT_AMBULATORY_CARE_PROVIDER_SITE_OTHER): Payer: No Typology Code available for payment source | Admitting: Licensed Clinical Social Worker

## 2021-04-01 ENCOUNTER — Encounter (HOSPITAL_COMMUNITY): Payer: Self-pay | Admitting: Licensed Clinical Social Worker

## 2021-04-01 DIAGNOSIS — F1020 Alcohol dependence, uncomplicated: Secondary | ICD-10-CM

## 2021-04-01 NOTE — Progress Notes (Signed)
Virtual Visit via Phone Note   I connected with Shane Frazier, Shane Frazier. on 04/01/21 at 9:00 am EST by a phone-enabled telemedicine application and verified that I am speaking with the correct person using two identifiers.   I discussed the limitations of evaluation and management by telemedicine and the availability of in person appointments. The patient expressed understanding and agreed to proceed.   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 Pt has become a daily drinker, "I didn't used to drink like this."    Observations/Objective:  Pt presented for his therapy session. Pt reports on his drinking within the last week: "I didn't drink within the last week, but there's a chance i'll drink this weekend. Its my wife's birthday."  Cln utilized CBT to process his triggers to drink. "My wife has scheduled birthday celebrations, that don't include me. I wanted to take her out, just [place], but she always wants to bring her college-age daughter with [place], no matter where we go." Cln and pt role-played communicating with his wife, asking for what he wants. Cln used CBT to explore reasons he stays in the marriage. Cln utilized MI OARS to express empathy. Cln gave pt information on marriage counselors: SEL group to research and discuss with his wife.       Assessment and Plan: Counselor will continue to meet with patient to address treatment plan goals. Patient will continue to follow recommendations of providers and implement skills learned in session.       Follow Up Instructions: I discussed the assessment and treatment plan with the patient. The patient was provided an opportunity to ask questions and all were answered. The patient agreed with the plan  and demonstrated an understanding of the instructions.   The patient was advised to call back or seek an in-person evaluation if the symptoms worsen or if the condition fails to improve as anticipated.   I provided 45 minutes of non-face-to-face time during this encounter.     Maria Coin S, LCAS

## 2021-04-08 ENCOUNTER — Ambulatory Visit (INDEPENDENT_AMBULATORY_CARE_PROVIDER_SITE_OTHER): Payer: No Typology Code available for payment source | Admitting: Licensed Clinical Social Worker

## 2021-04-08 ENCOUNTER — Other Ambulatory Visit: Payer: Self-pay

## 2021-04-08 ENCOUNTER — Encounter (HOSPITAL_COMMUNITY): Payer: Self-pay | Admitting: Licensed Clinical Social Worker

## 2021-04-08 DIAGNOSIS — F1020 Alcohol dependence, uncomplicated: Secondary | ICD-10-CM | POA: Diagnosis not present

## 2021-04-08 NOTE — Progress Notes (Signed)
Virtual Visit via Phone Note   I connected with Shane Frazier. on 04/08/21 at 9:15 am EST by a phone-enabled telemedicine application and verified that I am speaking with the correct person using two identifiers.   I discussed the limitations of evaluation and management by telemedicine and the availability of in person appointments. The patient expressed understanding and agreed to proceed.   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 Pt has become a daily drinker, "I didn't used to drink like this."    Observations/Objective:  Pt presented for his therapy session. Pt reports on his drinking within the last week: "I didn't drink within the last week."  Cln utilized CBT to process his triggers to drink. Pt reports he has decided to leave his marriage. Cln used CBT to explore his reasons for ending his marriage. "I drank because of my unhappiness in the marriage, so deciding to end the marriage has left me with no reason to drink. I feel good about my decision." Pt thanked cln for helping him walk through his decision and educating him on alcoholism, but "I'm ready to discontinue therapy."          Assessment and Plan: Discharge from therapy at patient's request..       Follow Up Instructions: I discussed the assessment and treatment plan with the patient. The patient was provided an opportunity to ask questions and all were answered. The patient agreed with the plan and demonstrated an understanding of the instructions.   The patient was advised to call back or seek an in-person evaluation if the symptoms worsen or if the condition fails to improve as anticipated.   I provided 40 minutes of non-face-to-face time during this  encounter.     Jahi Roza S, LCAS

## 2021-04-21 NOTE — Progress Notes (Shared)
[facility] Note  04/26/2021     CHIEF COMPLAINT Patient presents for No chief complaint on file.    HISTORY OF PRESENT ILLNESS: Shane Kyllo. is a 56 y.o. male who presents to the clinic today for:    Pt states vision about the same   Referring physician: Olin Hauser, DO [place],  [place] [address]   HISTORICAL INFORMATION:   Selected notes from the [facility] Referred by Dr. Ellin Mayhew LEE: 06.22.2022 Ocular Hx- NPDR OU, Mac edema OS, CSR OS BCVA OD 20/20, OS 20/40   CURRENT MEDICATIONS: Current Outpatient Medications (Ophthalmic Drugs)  Medication Sig   neomycin-polymyxin-dexamethasone (MAXITROL) 0.1 % ophthalmic suspension Location: Right Eye. 1 GTT AFFECTED EYE QID X 7 DAYS, DAILY X 7 DAYS, STOP (Patient not taking: Reported on 03/15/2021)   No current facility-administered medications for this visit. (Ophthalmic Drugs)   Current Outpatient Medications (Other)  Medication Sig   atorvastatin (LIPITOR) 20 MG tablet Take 1 tablet (20 mg total) by mouth at bedtime.   Blood Glucose Calibration (FREESTYLE CONTROL SOLUTION) LIQD Use to calibrate glucometer to check blood sugar   Blood Glucose Monitoring Suppl (FREESTYLE LITE) DEVI Use to check blood sugar up to 2 x daily   diclofenac Sodium (VOLTAREN) 1 % GEL Apply 2 g topically 3 (three) times daily as needed (left knee pain arthritis). (Patient not taking: Reported on 03/15/2021)   diltiazem (CARDIZEM LA) 420 MG 24 hr tablet TAKE 1 TABLET (420 MG TOTAL) BY MOUTH EVERY MORNING.   hydrochlorothiazide (HYDRODIURIL) 25 MG tablet Take 1 tablet (25 mg total) by mouth daily.   levothyroxine (SYNTHROID) 200 MCG tablet TAKE 1 TABLET BY MOUTH DAILY BEFORE BREAKFAST.   lisinopril (ZESTRIL) 20 MG tablet TAKE 1 TABLET BY MOUTH EVERY MORNING.   metFORMIN (GLUCOPHAGE) 500 MG tablet TAKE 1 TABLET (500 MG TOTAL) BY MOUTH 2 (TWO) TIMES DAILY WITH A MEAL.   OZEMPIC, 1 MG/DOSE, 4  MG/3ML SOPN INJECT 1 MG INTO THE SKIN ONCE A WEEK.   tadalafil (CIALIS) 20 MG tablet 1 tab po 1 hour prior to intercourse   No current facility-administered medications for this visit. (Other)   REVIEW OF SYSTEMS:     ALLERGIES Allergies  Allergen Reactions   Other Other (See Comments)    BRAZILIAN NUTS ONLY-"THROAT CLOSES UP"    PAST MEDICAL HISTORY Past Medical History:  Diagnosis Date   CHF (congestive heart failure), NYHA class I, acute on chronic, systolic ([place])    Concussion 2017   cause of seizure after hit head at work   Difficult intubation    patient unaware of this diagnosis   ED (erectile dysfunction)    Hypothyroidism    Seizures ([place]) 05-20-15   X 1-PT HIT HIS HEAD AT WORK AND THEN HAD A SEIZURE   Past Surgical History:  Procedure Laterality Date   COLONOSCOPY WITH PROPOFOL N/A 12/07/2018   Procedure: COLONOSCOPY WITH PROPOFOL;  Surgeon: Jonathon Bellows, MD;  Location: [facility] ENDOSCOPY;  Service: Gastroenterology;  Laterality: N/A;   FLEXIBLE BRONCHOSCOPY N/A 03/27/2018   Procedure: [place] With C-arm;  Surgeon: [name], MD;  Location: [facility] ORS;  Service: Pulmonary;  Laterality: N/A;   HERNIA REPAIR     as a child,umbilical   SHOULDER ARTHROSCOPY WITH ROTATOR CUFF REPAIR AND SUBACROMIAL DECOMPRESSION Left 09/09/2015   Procedure: SHOULDER ARTHROSCOPY, SUBACROMIAL DECOMPRESSION,BICEPS TENSON RELEASE AND MINI OPEN ROTATOR CUFF REPAIR;  Surgeon: Joneen Boers  Jefm Bryant, MD;  Location: [facility] ORS;  Service: Orthopedics;  Laterality: Left;   VENTRAL HERNIA REPAIR N/A 10/27/2017   Procedure: HERNIA REPAIR VENTRAL ADULT;  Surgeon: Jules Husbands, MD;  Location: [facility] ORS;  Service: General;  Laterality: N/A;    FAMILY HISTORY Family History  Problem Relation Age of Onset   Hypertension Mother    Cancer Father    Diabetes Sister    Thyroid cancer Sister     SOCIAL HISTORY Social History   Tobacco Use   Smoking status: Never   Smokeless  tobacco: Current    Types: Chew  Vaping Use   Vaping Use: Never used  Substance Use Topics   Alcohol use: Yes    Alcohol/week: 1.0 standard drink    Types: 1 Standard drinks or equivalent per week    Comment: occasionally. rarely   Drug use: No       OPHTHALMIC EXAM:  Not recorded     IMAGING AND PROCEDURES  Imaging and Procedures for 04/26/2021            ASSESSMENT/PLAN:    ICD-10-CM   1. Moderate nonproliferative diabetic retinopathy of both eyes with macular edema associated with type 2 diabetes mellitus ([place])  Z61.0960     2. Retinal macroaneurysm, left eye  H35.012     3. Essential hypertension  I10     4. Hypertensive retinopathy of both eyes  H35.033     5. Combined forms of age-related cataract of both eyes  H25.813        1. Moderate non-proliferative diabetic retinopathy, both eyes  - s/p IVA OS #1 (06.27.22), #2 (07.25.22), #3 (08.30.22; JDM), #4 (9.27.22), #5(10.25.22), #6 (11.28.22) - FA (06.27.22) shows leaking MA; no NV OU; OS -- focal RAM along IT arteriole with blockage and leakage - OCT shows OD: mild interval improvement in cystic changes; OS: mild interval improvement in [facility] and cystic changes along IT arcades - BCVA 20/20 OD, 20/20 OS (stably improved OS) - recommend IVA OS #7 today, 01.09.23 for DME - pt wishes to proceed with injection - RBA of procedure discussed, questions answered - informed consent obtained and signed - see procedure note - f/u in 6 wks -- DFE/OCT, possible injection  2. Retinal arterial macroaneurysm w/ macular edema OS -- improving  - recommend IVA OS #7 as above   3,4. Hypertensive retinopathy OU - discussed importance of tight BP control - monitor  5. Mixed Cataract OU - The symptoms of cataract, surgical options, and treatments and risks were discussed with patient. - discussed diagnosis and progression - not yet visually significant - monitor for now  Ophthalmic Meds Ordered this visit:  No orders  of the defined types were placed in this encounter.    No follow-ups on file.  There are no Patient Instructions on file for this visit.   Explained the diagnoses, plan, and follow up with the patient and they expressed understanding.  Patient expressed understanding of the importance of proper follow up care.   This document serves as a record of services personally performed by Gardiner Sleeper, MD, PhD. It was created on their behalf by Roselee Nova, COMT. The creation of this record is the provider's dictation and/or activities during the visit.  Electronically signed by: Roselee Nova, COMT 04/21/21 10:40 AM   Gardiner Sleeper, M.D., Ph.D. Diseases & Surgery of the Retina and Vitreous [facility]: M myopia (nearsighted); A astigmatism; H hyperopia (  farsighted); P presbyopia; Mrx spectacle prescription;  CTL contact lenses; OD right eye; OS left eye; OU both eyes  XT exotropia; ET esotropia; PEK punctate epithelial keratitis; PEE punctate epithelial erosions; DES dry eye syndrome; MGD meibomian gland dysfunction; ATs artificial tears; PFAT's preservative free artificial tears; [place] nuclear sclerotic cataract; PSC posterior subcapsular cataract; ERM epi-retinal membrane; PVD posterior vitreous detachment; RD retinal detachment; DM diabetes mellitus; DR diabetic retinopathy; NPDR non-proliferative diabetic retinopathy; PDR proliferative diabetic retinopathy; CSME clinically significant macular edema; DME diabetic macular edema; dbh dot blot hemorrhages; CWS cotton wool spot; POAG primary open angle glaucoma; C/D cup-to-disc ratio; HVF humphrey visual field; GVF goldmann visual field; OCT optical coherence tomography; IOP intraocular pressure; BRVO Branch retinal vein occlusion; CRVO central retinal vein occlusion; CRAO central retinal artery occlusion; BRAO branch retinal artery occlusion; RT retinal tear; SB scleral buckle; PPV pars plana vitrectomy; VH  Vitreous hemorrhage; PRP panretinal laser photocoagulation; IVK intravitreal kenalog; VMT vitreomacular traction; MH Macular hole;  NVD neovascularization of the disc; NVE neovascularization elsewhere; AREDS age related eye disease study; ARMD age related macular degeneration; POAG primary open angle glaucoma; EBMD epithelial/anterior basement membrane dystrophy; ACIOL anterior chamber intraocular lens; IOL intraocular lens; PCIOL posterior chamber intraocular lens; Phaco/IOL phacoemulsification with intraocular lens placement; [place] photorefractive keratectomy; LASIK laser assisted in situ keratomileusis; HTN hypertension; DM diabetes mellitus; COPD chronic obstructive pulmonary disease

## 2021-04-26 ENCOUNTER — Encounter (INDEPENDENT_AMBULATORY_CARE_PROVIDER_SITE_OTHER): Payer: No Typology Code available for payment source | Admitting: Ophthalmology

## 2021-04-26 DIAGNOSIS — H35033 Hypertensive retinopathy, bilateral: Secondary | ICD-10-CM

## 2021-04-26 DIAGNOSIS — H35012 Changes in retinal vascular appearance, left eye: Secondary | ICD-10-CM

## 2021-04-26 DIAGNOSIS — H25813 Combined forms of age-related cataract, bilateral: Secondary | ICD-10-CM

## 2021-04-26 DIAGNOSIS — E113313 Type 2 diabetes mellitus with moderate nonproliferative diabetic retinopathy with macular edema, bilateral: Secondary | ICD-10-CM

## 2021-04-26 DIAGNOSIS — I10 Essential (primary) hypertension: Secondary | ICD-10-CM

## 2021-05-03 ENCOUNTER — Other Ambulatory Visit: Payer: Self-pay | Admitting: Family Medicine

## 2021-05-03 ENCOUNTER — Other Ambulatory Visit: Payer: Self-pay

## 2021-05-03 DIAGNOSIS — E113293 Type 2 diabetes mellitus with mild nonproliferative diabetic retinopathy without macular edema, bilateral: Secondary | ICD-10-CM

## 2021-05-03 MED ORDER — OZEMPIC (1 MG/DOSE) 4 MG/3ML ~~LOC~~ SOPN
1.0000 mg | PEN_INJECTOR | SUBCUTANEOUS | 0 refills | Status: DC
  Filled 2021-05-03: qty 3, 28d supply, fill #0

## 2021-05-03 NOTE — Telephone Encounter (Signed)
Future OV 05/18/21.  Last ordered 02/23/21 Recent A1c 02/08/21  Approved per protocol.  Requested Prescriptions  Pending Prescriptions Disp Refills   OZEMPIC, 1 MG/DOSE, 4 MG/3ML SOPN 3 mL 0    Sig: INJECT 1 MG INTO THE SKIN ONCE A WEEK.     Endocrinology:  Diabetes - GLP-1 Receptor Agonists Failed - 05/03/2021  3:46 PM      Failed - HBA1C is between 0 and 7.9 and within 180 days    Hemoglobin A1C  Date Value Ref Range Status  10/17/2017 9.8  Final   Hgb A1c MFr Bld  Date Value Ref Range Status  02/08/2021 8.1 (H) <5.7 % of total Hgb Final    Comment:    For someone without known diabetes, a hemoglobin A1c value of 6.5% or greater indicates that they may have  diabetes and this should be confirmed with a follow-up  test. . For someone with known diabetes, a value <7% indicates  that their diabetes is well controlled and a value  greater than or equal to 7% indicates suboptimal  control. A1c targets should be individualized based on  duration of diabetes, age, comorbid conditions, and  other considerations. . Currently, no consensus exists regarding use of hemoglobin A1c for diagnosis of diabetes for children. Renella Cunas - Valid encounter within last 6 months    Recent Outpatient Visits          2 months ago Annual physical exam   [place], DO   3 months ago Alcohol dependence in early full remission [facility])   [place], DO   4 months ago Alcohol dependence in early full remission [facility])   [facility] Olin Hauser, DO   6 months ago Essential hypertension   [place], DO   9 months ago Encounter for Department of Transportation (DOT) examination for trucking license   [facility] Kathrine Haddock, NP      Future Appointments            In 2 weeks [place], Devonne Doughty, [facility], [facility]

## 2021-05-10 ENCOUNTER — Other Ambulatory Visit: Payer: Self-pay | Admitting: Family Medicine

## 2021-05-10 DIAGNOSIS — E039 Hypothyroidism, unspecified: Secondary | ICD-10-CM

## 2021-05-10 DIAGNOSIS — E113293 Type 2 diabetes mellitus with mild nonproliferative diabetic retinopathy without macular edema, bilateral: Secondary | ICD-10-CM

## 2021-05-11 ENCOUNTER — Other Ambulatory Visit: Payer: No Typology Code available for payment source

## 2021-05-18 ENCOUNTER — Ambulatory Visit: Payer: No Typology Code available for payment source | Admitting: Family Medicine

## 2021-06-15 ENCOUNTER — Other Ambulatory Visit: Payer: Self-pay

## 2021-06-15 ENCOUNTER — Ambulatory Visit (INDEPENDENT_AMBULATORY_CARE_PROVIDER_SITE_OTHER): Payer: No Typology Code available for payment source | Admitting: Family Medicine

## 2021-06-15 ENCOUNTER — Encounter: Payer: Self-pay | Admitting: Family Medicine

## 2021-06-15 VITALS — BP 150/84 | HR 95 | Ht 67.5 in | Wt 244.2 lb

## 2021-06-15 DIAGNOSIS — F4321 Adjustment disorder with depressed mood: Secondary | ICD-10-CM

## 2021-06-15 DIAGNOSIS — I10 Essential (primary) hypertension: Secondary | ICD-10-CM

## 2021-06-15 DIAGNOSIS — E113293 Type 2 diabetes mellitus with mild nonproliferative diabetic retinopathy without macular edema, bilateral: Secondary | ICD-10-CM | POA: Diagnosis not present

## 2021-06-15 DIAGNOSIS — F1020 Alcohol dependence, uncomplicated: Secondary | ICD-10-CM

## 2021-06-15 DIAGNOSIS — E039 Hypothyroidism, unspecified: Secondary | ICD-10-CM | POA: Diagnosis not present

## 2021-06-15 DIAGNOSIS — F1090 Alcohol use, unspecified, uncomplicated: Secondary | ICD-10-CM | POA: Insufficient documentation

## 2021-06-15 MED ORDER — OZEMPIC (1 MG/DOSE) 4 MG/3ML ~~LOC~~ SOPN
1.0000 mg | PEN_INJECTOR | SUBCUTANEOUS | 5 refills | Status: DC
  Filled 2021-06-15: qty 3, 28d supply, fill #0

## 2021-06-15 NOTE — Patient Instructions (Addendum)
Thank you for coming to the office today.  Refill Ozempic  Labs today for sugar and other blood work.  I would recommend return to [facility] for therapy session - if you can contact her that would be great to schedule.  Also consider Social Worker - Delana Meyer can talk to you as well about other resource.  PSYCHIATRY / [place]  Self referral for substance program if need  [facility] in [place], [place]  Address: [address], [place], [place] [address]  Phone: [phone]  [facility], available walk-in 9am-4pm M-F [address], [place] [address] Hours: [place] (M-F, walk in available) [phone]  Alcoholics Anonymous (AA)  [place] ([place] Web Site: [employer] Answering Service: [phone])[phone]   Please schedule a Follow-up Appointment to: Return if symptoms worsen or fail to improve.  If you have any other questions or concerns, please feel free to call the office or send a message through [place]. You may also schedule an earlier appointment if necessary.  Additionally, you may be receiving a survey about your experience at our office within a few days to 1 week by e-mail or mail. We value your feedback.  [name], DO [place]

## 2021-06-15 NOTE — Progress Notes (Signed)
Subjective:    Patient ID: Shane Picket., male    DOB: November 06, 1965, 56 y.o.   MRN: [id number]  Shane Laviolette. is a 56 y.o. male presenting on 06/15/2021 for Diabetes and Hypertension   HPI  Hypothyroidism Last lab showed elevated TSH now up to 19.05, and normal T4 1.0, (low normal T4), however patient admits that he was off levothyroxine 27mg daily. Due for repeat labs   CHRONIC DM, Type 2: Morbid Obesity BMI >37 Hyperlipidemia Improved overall. Last A1c had improved to 8.1 Doing well on Ozempic needs new order, missed dose, ran out CBGs: Home readings improved - ordered Freestyle Lite new testing supplies recently Meds: Ozempic 136mweekly injection, Metformin 50061mID Reports good compliance. 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 Denies hypoglycemia   CHRONIC HTN: Mild elevated, not always taking medication regularly. Current Meds - Lisinopril 44m34mily, Diltiazem LA 444mg49mly, HCTZ 25mg 24my Reports good compliance, took meds today. Tolerating well, w/o complaints. Denies CP, dyspnea, HA, edema, dizziness / lightheadedness  Alcohol Dependence, resumed He had previously reduced intake. 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 He has support system with church.  He is working on living situation now that he cannot go back to previous house. He has arrangements, but acknowledges if he  needs help will let us [place] Depression screen PHQ 2/[facility]/28/2023 01/21/2021 01/18/2021  Decreased Interest 3 0 0  Down, Depressed, Hopeless 3 0 0  PHQ - 2 Score 6 0 0  Altered sleeping 0 0 -  Tired, decreased energy 0 - -  Change in appetite 0 0 -  Feeling bad or failure about yourself  3 0 -  Trouble concentrating 0 0 -  Moving slowly or fidgety/restless 0 0 -  Suicidal thoughts 0 0 -  PHQ-9 Score 9 0 -  Difficult doing work/chores Not difficult at all Not difficult at all -   GAD 7 : Generalized Anxiety Score 06/15/2021  Nervous, Anxious, on Edge 3  Control/stop worrying 3  Worry too much - different things 3  Trouble relaxing 3  Restless 0  Easily annoyed or irritable 0  Afraid - awful might happen 0  Total GAD 7 Score 12  Anxiety Difficulty Somewhat difficult      Social History   Tobacco Use   Smoking status: Never   Smokeless tobacco: Current    Types: Chew  Vaping Use   Vaping Use: Never used  Substance Use Topics   Alcohol use: Not Currently    Alcohol/week: 8.0 standard drinks    Types: 8 Shots of liquor per week    Comment: occasionally. rarely   Drug use: No    Review of Systems Per HPI unless specifically indicated above     Objective:    BP (!) 150/84 (BP Location: Left Arm, Cuff Size: Normal)    Pulse 95    Ht 5' 7.5" (1.715 m)  Wt 244 lb 3.2 oz (110.8 kg)    SpO2 99%    BMI 37.68 kg/m   Wt Readings from Last 3 Encounters:  06/15/21 244 lb 3.2 oz (110.8 kg)  02/15/21 248 lb (112.5 kg)  01/18/21 242 lb 3.2 oz (109.9 kg)    Physical Exam Vitals and nursing note reviewed.  Constitutional:      General: He is not in acute distress.    Appearance: He is well-developed. He is not diaphoretic.     Comments: Well-appearing, comfortable, cooperative  HENT:     Head: Normocephalic and atraumatic.  Eyes:     General:        Right eye: No discharge.        Left eye: No discharge.     Conjunctiva/sclera: Conjunctivae normal.  Neck:     Thyroid:  No thyromegaly.  Cardiovascular:     Rate and Rhythm: Normal rate and regular rhythm.     Pulses: Normal pulses.     Heart sounds: Normal heart sounds. No murmur heard. Pulmonary:     Effort: Pulmonary effort is normal. No respiratory distress.     Breath sounds: Normal breath sounds. No wheezing or rales.  Musculoskeletal:        General: Normal range of motion.     Cervical back: Normal range of motion and neck supple.  Lymphadenopathy:     Cervical: No cervical adenopathy.  Skin:    General: Skin is warm and dry.     Findings: No erythema or rash.  Neurological:     Mental Status: He is alert and oriented to person, place, and time. Mental status is at baseline.     Comments: Depressed mood  Psychiatric:        Behavior: Behavior normal.     Comments: Well groomed, good eye contact, normal speech and thoughts     Results for orders placed or performed in visit on 02/08/21  T4, free  Result Value Ref Range   Free T4 1.0 0.8 - 1.8 ng/dL  Lipid panel  Result Value Ref Range   Cholesterol 213 (H) <200 mg/dL   HDL 70 > OR = 40 mg/dL   Triglycerides 61 <150 mg/dL   LDL Cholesterol (Calc) 128 (H) mg/dL (calc)   Total CHOL/HDL Ratio 3.0 <5.0 (calc)   Non-HDL Cholesterol (Calc) 143 (H) <130 mg/dL (calc)  COMPLETE METABOLIC PANEL WITH GFR  Result Value Ref Range   Glucose, Bld 137 (H) 65 - 99 mg/dL   BUN 16 7 - 25 mg/dL   Creat 1.14 0.70 - 1.30 mg/dL   eGFR 76 > OR = 60 mL/min/1.15m   BUN/Creatinine Ratio NOT APPLICABLE 6 - 22 (calc)   Sodium 140 135 - 146 mmol/L   Potassium 4.0 3.5 - 5.3 mmol/L   Chloride 103 98 - 110 mmol/L   CO2 30 20 - 32 mmol/L   Calcium 9.3 8.6 - 10.3 mg/dL   Total Protein 6.9 6.1 - 8.1 g/dL   Albumin 4.0 3.6 - 5.1 g/dL   Globulin 2.9 1.9 - 3.7 g/dL (calc)   AG Ratio 1.4 1.0 - 2.5 (calc)   Total Bilirubin 0.5 0.2 - 1.2 mg/dL   Alkaline phosphatase (APISO) 61 35 - 144 U/L   AST 16 10 - 35 U/L   ALT 17 9 - 46 U/L  Hemoglobin A1c  Result Value  Ref Range   Hgb A1c MFr Bld 8.1 (H) <5.7 % of total Hgb   Mean Plasma Glucose  186 mg/dL   eAG (mmol/L) 10.3 mmol/L  TSH  Result Value Ref Range   TSH 19.05 (H) 0.40 - 4.50 mIU/L  CBC with Differential/Platelet  Result Value Ref Range   WBC 4.9 3.8 - 10.8 Thousand/uL   RBC 4.91 4.20 - 5.80 Million/uL   Hemoglobin 14.4 13.2 - 17.1 g/dL   HCT 44.0 38.5 - 50.0 %   MCV 89.6 80.0 - 100.0 fL   MCH 29.3 27.0 - 33.0 pg   MCHC 32.7 32.0 - 36.0 g/dL   RDW 13.0 11.0 - 15.0 %   Platelets 266 140 - 400 Thousand/uL   MPV 10.2 7.5 - 12.5 fL   Neutro Abs 2,808 1,500 - 7,800 cells/uL   Lymphs Abs 1,460 850 - 3,900 cells/uL   Absolute Monocytes 441 200 - 950 cells/uL   Eosinophils Absolute 152 15 - 500 cells/uL   Basophils Absolute 39 0 - 200 cells/uL   Neutrophils Relative % 57.3 %   Total Lymphocyte 29.8 %   Monocytes Relative 9.0 %   Eosinophils Relative 3.1 %   Basophils Relative 0.8 %  PSA  Result Value Ref Range   PSA 0.25 < OR = 4.00 ng/mL      Assessment & Plan:   Problem List Items Addressed This Visit     Uncomplicated alcohol dependence (HCC)   Type 2 diabetes mellitus with diabetic retinopathy ([place]) - Primary   Relevant Medications   OZEMPIC, 1 MG/DOSE, 4 MG/3ML SOPN   Hypothyroidism   Essential hypertension    Hypothyroid Back on therapy Will check TSH T4  HTN Elevated w/ resumed alcohol and stress/depression  Type 2 DM Uncontrolled w/ retinopathy neuropathy Admits some worsening neuropathy symptoms, - can be alcohol or sugar. Restart Ozempic new order [place]m to [place] he was out due to separation  Alcohol Dependence, severe uncomplicated Adjustment disorder depressed mood Resumed now with life stressor with separation from relationship Coping mechanism No intent for self harm He has some support system in church Encouraged him to return to [facility]	[place] he can call to schedule We will defer SSRI therapy today but can reconsider in near  future if reduces alcohol. Handout for AA and resources given today.  Meds ordered this encounter  Medications   OZEMPIC, 1 MG/DOSE, 4 MG/3ML SOPN    Sig: Inject 1 mg into the skin once a week.    Dispense:  3 mL    Refill:  5      Follow up plan: Return if symptoms worsen or fail to improve.  ANobie Putnam DWilliamsportMedical Group 06/15/2021, 9:39 AM

## 2021-06-16 LAB — COMPLETE METABOLIC PANEL WITH GFR
AG Ratio: 1.4 (calc) (ref 1.0–2.5)
ALT: 19 U/L (ref 9–46)
AST: 22 U/L (ref 10–35)
Albumin: 4.3 g/dL (ref 3.6–5.1)
Alkaline phosphatase (APISO): 68 U/L (ref 35–144)
BUN: 10 mg/dL (ref 7–25)
CO2: 34 mmol/L — ABNORMAL HIGH (ref 20–32)
Calcium: 9.8 mg/dL (ref 8.6–10.3)
Chloride: 100 mmol/L (ref 98–110)
Creat: 1.11 mg/dL (ref 0.70–1.30)
Globulin: 3.1 g/dL (calc) (ref 1.9–3.7)
Glucose, Bld: 182 mg/dL — ABNORMAL HIGH (ref 65–139)
Potassium: 3.7 mmol/L (ref 3.5–5.3)
Sodium: 142 mmol/L (ref 135–146)
Total Bilirubin: 0.4 mg/dL (ref 0.2–1.2)
Total Protein: 7.4 g/dL (ref 6.1–8.1)
eGFR: 79 mL/min/{1.73_m2} (ref >=60)

## 2021-06-16 LAB — T4, FREE: Free T4: 1.9 ng/dL — ABNORMAL HIGH (ref 0.8–1.8)

## 2021-06-16 LAB — TSH: TSH: 3.61 mIU/L (ref 0.40–4.50)

## 2021-06-16 LAB — HEMOGLOBIN A1C
Hgb A1c MFr Bld: 8.9 % of total Hgb — ABNORMAL HIGH (ref <5.7)
Mean Plasma Glucose: 209 mg/dL
eAG (mmol/L): 11.6 mmol/L

## 2021-06-18 ENCOUNTER — Other Ambulatory Visit: Payer: Self-pay | Admitting: Family Medicine

## 2021-06-18 DIAGNOSIS — E039 Hypothyroidism, unspecified: Secondary | ICD-10-CM

## 2021-06-18 MED ORDER — LEVOTHYROXINE SODIUM 175 MCG PO TABS
175.0000 ug | ORAL_TABLET | Freq: Every day | ORAL | 3 refills | Status: DC
  Filled 2021-06-18: qty 90, 90d supply, fill #0

## 2021-06-21 ENCOUNTER — Other Ambulatory Visit: Payer: Self-pay

## 2021-06-21 ENCOUNTER — Other Ambulatory Visit: Payer: Self-pay | Admitting: Family Medicine

## 2021-06-21 DIAGNOSIS — E113293 Type 2 diabetes mellitus with mild nonproliferative diabetic retinopathy without macular edema, bilateral: Secondary | ICD-10-CM

## 2021-06-22 ENCOUNTER — Other Ambulatory Visit: Payer: Self-pay

## 2021-06-22 MED ORDER — LISINOPRIL 20 MG PO TABS
ORAL_TABLET | Freq: Every morning | ORAL | 1 refills | Status: DC
  Filled 2021-06-22: qty 90, 90d supply, fill #0

## 2021-06-22 NOTE — Telephone Encounter (Signed)
Requested Prescriptions  ?Pending Prescriptions Disp Refills  ?? lisinopril (ZESTRIL) 20 MG tablet 90 tablet 1  ?  Sig: TAKE 1 TABLET BY MOUTH EVERY MORNING.  ?  ? Cardiovascular:  ACE Inhibitors Failed - 06/21/2021  9:39 AM  ?  ?  Failed - Last BP in normal range  ?  BP Readings from Last 1 Encounters:  ?06/15/21 (!) 150/84  ?   ?  ?  Passed - Cr in normal range and within 180 days  ?  Creat  ?Date Value Ref Range Status  ?06/15/2021 1.11 0.70 - 1.30 mg/dL Final  ?   ?  ?  Passed - K in normal range and within 180 days  ?  Potassium  ?Date Value Ref Range Status  ?06/15/2021 3.7 3.5 - 5.3 mmol/L Final  ?   ?  ?  Passed - Patient is not pregnant  ?  ?  Passed - Valid encounter within last 6 months  ?  Recent Outpatient Visits   ?      ? 1 week ago Type 2 diabetes mellitus with both eyes 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, without long-term current use of insulin ([place])  ? [place], DO  ? 4 months ago Annual physical exam  ? [place], DO  ? 5 months ago Alcohol dependence in early full remission [facility])  ? [place], DO  ? 6 months ago Alcohol dependence in early full remission [facility])  ? [place], DO  ? 8 months ago Essential hypertension  ? [place], DO  ?  ?  ? ?  ?  ?  ? ? ?

## 2021-07-23 ENCOUNTER — Other Ambulatory Visit (HOSPITAL_COMMUNITY): Payer: Self-pay

## 2021-07-26 ENCOUNTER — Other Ambulatory Visit (HOSPITAL_COMMUNITY): Payer: Self-pay

## 2021-08-16 ENCOUNTER — Other Ambulatory Visit: Payer: Self-pay

## 2021-08-23 ENCOUNTER — Other Ambulatory Visit: Payer: Self-pay

## 2021-08-24 ENCOUNTER — Telehealth: Payer: Self-pay

## 2021-08-24 ENCOUNTER — Other Ambulatory Visit: Payer: Self-pay

## 2021-08-24 DIAGNOSIS — I10 Essential (primary) hypertension: Secondary | ICD-10-CM

## 2021-08-24 MED ORDER — DILTIAZEM HCL ER 420 MG PO TB24
420.0000 mg | ORAL_TABLET | Freq: Every day | ORAL | 3 refills | Status: DC
  Filled 2021-08-24 (×2): qty 90, 90d supply, fill #0

## 2021-08-24 NOTE — Addendum Note (Signed)
Addended by: Olin Hauser on: 08/24/2021 04:50 PM ? ? Modules accepted: Orders ? ?

## 2021-08-24 NOTE — Telephone Encounter (Signed)
Pt in  the office requesting refills on  Diltiazem BP  medication  called into  drugs

## 2021-08-25 ENCOUNTER — Other Ambulatory Visit: Payer: Self-pay

## 2021-08-27 ENCOUNTER — Other Ambulatory Visit: Payer: Self-pay

## 2021-08-27 MED ORDER — DILTIAZEM HCL ER BEADS 420 MG PO CP24
420.0000 mg | ORAL_CAPSULE | Freq: Every day | ORAL | 3 refills | Status: DC
  Filled 2021-08-27: qty 90, 90d supply, fill #0

## 2021-08-27 NOTE — Addendum Note (Signed)
Addended by: Olin Hauser on: 08/27/2021 06:00 PM ? ? Modules accepted: Orders ? ?

## 2021-08-29 ENCOUNTER — Other Ambulatory Visit: Payer: Self-pay

## 2021-08-31 ENCOUNTER — Other Ambulatory Visit: Payer: Self-pay

## 2021-09-06 ENCOUNTER — Encounter: Payer: Self-pay | Admitting: Family Medicine

## 2021-09-06 ENCOUNTER — Other Ambulatory Visit: Payer: Self-pay

## 2021-09-06 ENCOUNTER — Other Ambulatory Visit: Payer: Self-pay | Admitting: Family Medicine

## 2021-09-06 ENCOUNTER — Ambulatory Visit (INDEPENDENT_AMBULATORY_CARE_PROVIDER_SITE_OTHER): Payer: No Typology Code available for payment source | Admitting: Family Medicine

## 2021-09-06 VITALS — BP 132/74 | HR 82 | Ht 67.5 in | Wt 242.2 lb

## 2021-09-06 DIAGNOSIS — E039 Hypothyroidism, unspecified: Secondary | ICD-10-CM

## 2021-09-06 DIAGNOSIS — F4321 Adjustment disorder with depressed mood: Secondary | ICD-10-CM | POA: Diagnosis not present

## 2021-09-06 DIAGNOSIS — E113293 Type 2 diabetes mellitus with mild nonproliferative diabetic retinopathy without macular edema, bilateral: Secondary | ICD-10-CM

## 2021-09-06 DIAGNOSIS — Z125 Encounter for screening for malignant neoplasm of prostate: Secondary | ICD-10-CM

## 2021-09-06 DIAGNOSIS — I10 Essential (primary) hypertension: Secondary | ICD-10-CM

## 2021-09-06 DIAGNOSIS — Z0000 Encounter for general adult medical examination without abnormal findings: Secondary | ICD-10-CM

## 2021-09-06 DIAGNOSIS — E1169 Type 2 diabetes mellitus with other specified complication: Secondary | ICD-10-CM

## 2021-09-06 DIAGNOSIS — F1020 Alcohol dependence, uncomplicated: Secondary | ICD-10-CM | POA: Diagnosis not present

## 2021-09-06 LAB — POCT GLYCOSYLATED HEMOGLOBIN (HGB A1C): Hemoglobin A1C: 8.7 % — AB (ref 4.0–5.6)

## 2021-09-06 MED ORDER — OZEMPIC (1 MG/DOSE) 4 MG/3ML ~~LOC~~ SOPN
1.0000 mg | PEN_INJECTOR | SUBCUTANEOUS | 5 refills | Status: DC
  Filled 2021-09-06: qty 3, 28d supply, fill #0

## 2021-09-06 NOTE — Assessment & Plan Note (Signed)
Reduced alcohol intake

## 2021-09-06 NOTE — Assessment & Plan Note (Signed)
Improved A1c to 8.7 from 8.9, not regularly back on Ozempic yet. Now plans to adhere to meds With some hyperglycemia still reported on CBGs Without Hypoglycemia Complicated by DM retinopathy  Plan:  1. Re order Ozempic '1mg'$  weekly inj, -re-sent to pharmacy, he had issue with admin, advise he can ask pharmacist for confirm dial dose or can bring here 2. RESTART and Adhere to Metformin '500mg'$  BID, dose inc in future if indicated 3. Encourage improved lifestyle - low carb, low sugar diet, reduce portion size, continue improving regular exercise 4. Check CBG, bring log to next visit for review

## 2021-09-06 NOTE — Patient Instructions (Addendum)
Thank you for coming to the office today.  Restart Ozempic '1mg'$  weekly injection, check with the pharmacy on the dial for the dosage, if need to bring it here, you can.  Metformin '500mg'$  twice a day, other meds should be available, let us know if need new refills.   DUE for FASTING BLOOD WORK (no food or drink after midnight before the lab appointment, only water or coffee without cream/sugar on the morning of)  SCHEDULE "Lab Only" visit in the morning at the clinic for lab draw in 4-5 MONTHS   - Make sure Lab Only appointment is at about 1 week before your next appointment, so that results will be available  For Lab Results, once available within 2-3 days of blood draw, you can can log in to MyChart online to view your results and a brief explanation. Also, we can discuss results at next follow-up visit.   Please schedule a Follow-up Appointment to: Return in about 5 months (around 02/06/2022) for 4-5 month early Oct fasting lab only then 1 week later Annual Physical.  If you have any other questions or concerns, please feel free to call the office or send a message through [place]. You may also schedule an earlier appointment if necessary.  Additionally, you may be receiving a survey about your experience at our office within a few days to 1 week by e-mail or mail. We value your feedback.  [name], DO [place]

## 2021-09-06 NOTE — Assessment & Plan Note (Signed)
Controlled Goal to check home readings. Complication OSA on CPAP    Plan:  1. Continue current BP regimen - Diltiazem '420mg'$  daily, Lisinopril '20mg'$  daily, HCTZ '25mg'$  daily 2. Encourage improved lifestyle - low sodium diet, regular exercise 3. Continue monitor BP outside office, bring readings to next visit, if persistently >140/90 or new symptoms notify office sooner

## 2021-09-06 NOTE — Assessment & Plan Note (Signed)
Mood improving. Has support system. Now has own place Not on medication Has Psych/Therapist

## 2021-09-06 NOTE — Progress Notes (Signed)
Subjective:    Patient ID: Shane Picket., male    DOB: 01/15/1966, 56 y.o.   MRN: [id number]  Shane Frazier. is a 56 y.o. male presenting on 09/06/2021 for Diabetes   HPI  CHRONIC DM, Type 2: Morbid Obesity BMI >37 Hyperlipidemia A1c down to 8.7, previous 8.9 Off Ozempic for past 3+ months. Did not have place to stay to refrigerate CBGs: Home readings improved - ordered Freestyle Lite new testing supplies recently Meds: previously Ozempic '1mg'$  weekly injection, Metformin '500mg'$  BID Reports good compliance. 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 Denies hypoglycemia   CHRONIC HTN: Mild elevated, not always taking medication regularly. Missed some doses previously Current Meds - Lisinopril '20mg'$  daily, Diltiazem LA '420mg'$  daily, HCTZ '25mg'$  daily Reports good compliance, took meds today. Tolerating well, w/o complaints. 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 He has support system with church.  He has own RV living location now      09/06/2021    8:33 AM 06/15/2021    9:12 AM 01/21/2021    8:51 AM  Depression screen PHQ 2/9  Decreased Interest 1 3   Down, Depressed, Hopeless 1 3   PHQ - 2 Score 2 6   Altered sleeping 2 0   Tired, decreased energy 1 0   Change in appetite 0 0   Feeling bad or failure about yourself  0 3   Trouble concentrating 1 0   Moving slowly or fidgety/restless 0 0    Suicidal thoughts 0 0   PHQ-9 Score 6 9   Difficult doing work/chores Somewhat difficult Not difficult at all      Information is confidential and restricted. Go to Review Flowsheets to unlock data.    Social History   Tobacco Use   Smoking status: Never   Smokeless tobacco: Current    Types: Chew  Vaping Use   Vaping Use: Never used  Substance Use Topics   Alcohol use: Not Currently    Alcohol/week: 8.0 standard drinks    Types: 8 Shots of liquor per week    Comment: occasionally. rarely   Drug use: No    Review of Systems Per HPI unless specifically indicated above     Objective:    BP 132/74   Pulse 82   Ht 5' 7.5" (1.715 m)   Wt 242 lb 3.2 oz (109.9 kg)   SpO2 98%   BMI 37.37 kg/m   Wt Readings from Last 3 Encounters:  09/06/21 242 lb 3.2 oz (109.9 kg)  06/15/21 244 lb 3.2 oz (110.8 kg)  02/15/21 248 lb (112.5 kg)    Physical Exam Vitals and nursing note reviewed.  Constitutional:      General: He is not in acute distress.    Appearance: He is well-developed. He is obese. He is not diaphoretic.     Comments: Well-appearing, comfortable, cooperative  HENT:     Head: Normocephalic and atraumatic.  Eyes:     General:        Right eye: No discharge.        Left eye: No discharge.     Conjunctiva/sclera: Conjunctivae normal.  Neck:     Thyroid: No thyromegaly.  Cardiovascular:     Rate and Rhythm: Normal rate and regular rhythm.     Pulses: Normal pulses.     Heart sounds: Normal heart sounds. No murmur heard. Pulmonary:     Effort: Pulmonary effort is normal. No respiratory distress.     Breath sounds: Normal breath sounds. No wheezing or rales.  Musculoskeletal:        General: Normal range of motion.     Cervical back: Normal range of motion and neck supple.  Lymphadenopathy:     Cervical: No cervical adenopathy.  Skin:    General: Skin is warm and dry.     Findings: No erythema or rash.  Neurological:     Mental Status: He is alert and  oriented to person, place, and time. Mental status is at baseline.  Psychiatric:        Behavior: Behavior normal.     Comments: Well groomed, good eye contact, normal speech and thoughts    Diabetic Foot Exam - Simple   Simple Foot Form Diabetic Foot exam was performed with the following findings: Yes 09/06/2021  8:22 AM  Visual Inspection See comments: Yes Sensation Testing Intact to touch and monofilament testing bilaterally: Yes Pulse Check Posterior Tibialis and Dorsalis pulse intact bilaterally: Yes Comments Dry skin dermatitis, callus formation, intact to monofilament.     Current Outpatient Medications:    atorvastatin (LIPITOR) 20 MG tablet, Take 1 tablet (20 mg total) by mouth at bedtime., Disp: 90 tablet, Rfl: 3   Blood Glucose Calibration (FREESTYLE CONTROL SOLUTION) LIQD, Use to calibrate glucometer to check blood sugar, Disp: 1 each, Rfl: 0   Blood Glucose Monitoring Suppl (FREESTYLE LITE) DEVI, Use to check blood sugar up to 2 x daily, Disp: 1 each, Rfl: 0   diltiazem (TIAZAC) 420 MG 24 hr capsule, Take 1 capsule (420 mg total) by mouth daily., Disp: 90 capsule, Rfl: 3   hydrochlorothiazide (HYDRODIURIL) 25 MG tablet, Take 1 tablet (25 mg total) by mouth daily., Disp: 90 tablet, Rfl: 3   levothyroxine (SYNTHROID) 175 MCG tablet, Take 1 tablet (175 mcg total) by mouth daily before breakfast., Disp: 90 tablet, Rfl: 3   lisinopril (ZESTRIL) 20 MG tablet, TAKE 1 TABLET BY MOUTH EVERY MORNING., Disp: 90 tablet, Rfl: 1   metFORMIN (GLUCOPHAGE) 500 MG tablet, TAKE 1 TABLET (500 MG TOTAL) BY MOUTH 2 (TWO) TIMES DAILY WITH A MEAL., Disp: 180 tablet, Rfl: 3   tadalafil (CIALIS) 20 MG tablet, 1 tab po 1 hour prior to intercourse, Disp: 30 tablet, Rfl: 0   diclofenac Sodium (VOLTAREN) 1 % GEL, Apply 2 g topically 3 (three) times daily as needed (left knee pain arthritis). (Patient not taking: Reported on 03/15/2021), Disp: 100 g, Rfl: 2   OZEMPIC, 1 MG/DOSE, 4 MG/3ML SOPN, Inject 1  mg into the skin once a week., Disp: 3 mL, Rfl: 5   Results for orders placed or performed in visit on 09/06/21  POCT HgB A1C  Result Value Ref Range   Hemoglobin A1C 8.7 (A) 4.0 - 5.6 %      Assessment & Plan:   Problem List Items Addressed This Visit     Uncomplicated alcohol dependence ([place])    Reduced alcohol intake  Type 2 diabetes mellitus with diabetic retinopathy (HCC) - Primary    Improved A1c to 8.7 from 8.9, not regularly back on Ozempic yet. Now plans to adhere to meds With some hyperglycemia still reported on CBGs Without Hypoglycemia Complicated by DM retinopathy  Plan:  1. Re order Ozempic '1mg'$  weekly inj, -re-sent to pharmacy, he had issue with admin, advise he can ask pharmacist for confirm dial dose or can bring here 2. RESTART and Adhere to Metformin '500mg'$  BID, dose inc in future if indicated 3. Encourage improved lifestyle - low carb, low sugar diet, reduce portion size, continue improving regular exercise 4. Check CBG, bring log to next visit for review      Relevant Medications   OZEMPIC, 1 MG/DOSE, 4 MG/3ML SOPN   Other Relevant Orders   POCT HgB A1C (Completed)   Morbid obesity ([place])    Morbid Obesity BMI >37 w diabetes, HLD, HTN Weight down to 242 from 248 lbs       Relevant Medications   OZEMPIC, 1 MG/DOSE, 4 MG/3ML SOPN   Essential hypertension    Controlled Goal to check home readings. Complication OSA on CPAP    Plan:  1. Continue current BP regimen - Diltiazem '420mg'$  daily, Lisinopril '20mg'$  daily, HCTZ '25mg'$  daily 2. Encourage improved lifestyle - low sodium diet, regular exercise 3. Continue monitor BP outside office, bring readings to next visit, if persistently >140/90 or new symptoms notify office sooner       Adjustment disorder with depressed mood    Mood improving. Has support system. Now has own place Not on medication Has Psych/Therapist        Meds ordered this encounter  Medications   OZEMPIC, 1 MG/DOSE, 4  MG/3ML SOPN    Sig: Inject 1 mg into the skin once a week.    Dispense:  3 mL    Refill:  5      Follow up plan: Return in about 5 months (around 02/06/2022) for 4-5 month early Oct fasting lab only then 1 week later Annual Physical.  Future labs ordered for 01/2022  [name], [facility] 09/06/2021, 8:15 AM

## 2021-09-06 NOTE — Assessment & Plan Note (Signed)
Morbid Obesity BMI >37 w diabetes, HLD, HTN Weight down to 242 from 248 lbs

## 2021-09-28 ENCOUNTER — Other Ambulatory Visit: Payer: Self-pay

## 2021-10-13 ENCOUNTER — Other Ambulatory Visit (HOSPITAL_COMMUNITY): Payer: Self-pay

## 2022-01-28 ENCOUNTER — Other Ambulatory Visit: Payer: Self-pay

## 2022-01-28 DIAGNOSIS — Z125 Encounter for screening for malignant neoplasm of prostate: Secondary | ICD-10-CM

## 2022-01-28 DIAGNOSIS — Z0000 Encounter for general adult medical examination without abnormal findings: Secondary | ICD-10-CM

## 2022-01-28 DIAGNOSIS — E039 Hypothyroidism, unspecified: Secondary | ICD-10-CM

## 2022-01-28 DIAGNOSIS — E113293 Type 2 diabetes mellitus with mild nonproliferative diabetic retinopathy without macular edema, bilateral: Secondary | ICD-10-CM

## 2022-01-28 DIAGNOSIS — E1169 Type 2 diabetes mellitus with other specified complication: Secondary | ICD-10-CM

## 2022-01-28 DIAGNOSIS — I10 Essential (primary) hypertension: Secondary | ICD-10-CM

## 2022-01-31 ENCOUNTER — Other Ambulatory Visit: Payer: No Typology Code available for payment source

## 2022-02-01 LAB — CBC WITH DIFFERENTIAL/PLATELET
Absolute Monocytes: 340 cells/uL (ref 200–950)
Basophils Absolute: 30 cells/uL (ref 0–200)
Basophils Relative: 0.7 %
Eosinophils Absolute: 120 cells/uL (ref 15–500)
Eosinophils Relative: 2.8 %
HCT: 44.2 % (ref 38.5–50.0)
Hemoglobin: 15.2 g/dL (ref 13.2–17.1)
Lymphs Abs: 1294 cells/uL (ref 850–3900)
MCH: 31 pg (ref 27.0–33.0)
MCHC: 34.4 g/dL (ref 32.0–36.0)
MCV: 90 fL (ref 80.0–100.0)
MPV: 11.5 fL (ref 7.5–12.5)
Monocytes Relative: 7.9 %
Neutro Abs: 2516 cells/uL (ref 1500–7800)
Neutrophils Relative %: 58.5 %
Platelets: 252 10*3/uL (ref 140–400)
RBC: 4.91 10*6/uL (ref 4.20–5.80)
RDW: 12.6 % (ref 11.0–15.0)
Total Lymphocyte: 30.1 %
WBC: 4.3 10*3/uL (ref 3.8–10.8)

## 2022-02-01 LAB — COMPLETE METABOLIC PANEL WITH GFR
AG Ratio: 1.2 (calc) (ref 1.0–2.5)
ALT: 16 U/L (ref 9–46)
AST: 15 U/L (ref 10–35)
Albumin: 4.1 g/dL (ref 3.6–5.1)
Alkaline phosphatase (APISO): 71 U/L (ref 35–144)
BUN: 15 mg/dL (ref 7–25)
CO2: 27 mmol/L (ref 20–32)
Calcium: 9.3 mg/dL (ref 8.6–10.3)
Chloride: 100 mmol/L (ref 98–110)
Creat: 1.06 mg/dL (ref 0.70–1.30)
Globulin: 3.3 g/dL (calc) (ref 1.9–3.7)
Glucose, Bld: 234 mg/dL — ABNORMAL HIGH (ref 65–99)
Potassium: 3.8 mmol/L (ref 3.5–5.3)
Sodium: 138 mmol/L (ref 135–146)
Total Bilirubin: 0.5 mg/dL (ref 0.2–1.2)
Total Protein: 7.4 g/dL (ref 6.1–8.1)
eGFR: 83 mL/min/{1.73_m2} (ref >=60)

## 2022-02-01 LAB — LIPID PANEL
Cholesterol: 245 mg/dL — ABNORMAL HIGH (ref <200)
HDL: 74 mg/dL (ref >=40)
LDL Cholesterol (Calc): 155 mg/dL (calc) — ABNORMAL HIGH
Non-HDL Cholesterol (Calc): 171 mg/dL (calc) — ABNORMAL HIGH (ref <130)
Total CHOL/HDL Ratio: 3.3 (calc) (ref <5.0)
Triglycerides: 67 mg/dL (ref <150)

## 2022-02-01 LAB — HEMOGLOBIN A1C
Hgb A1c MFr Bld: 9.1 % of total Hgb — ABNORMAL HIGH (ref <5.7)
Mean Plasma Glucose: 214 mg/dL
eAG (mmol/L): 11.9 mmol/L

## 2022-02-01 LAB — PSA: PSA: 0.18 ng/mL (ref <=4.00)

## 2022-02-01 LAB — T4, FREE: Free T4: 0.8 ng/dL (ref 0.8–1.8)

## 2022-02-01 LAB — TSH: TSH: 35 mIU/L — ABNORMAL HIGH (ref 0.40–4.50)

## 2022-02-07 ENCOUNTER — Ambulatory Visit (INDEPENDENT_AMBULATORY_CARE_PROVIDER_SITE_OTHER): Payer: No Typology Code available for payment source | Admitting: Family Medicine

## 2022-02-07 ENCOUNTER — Other Ambulatory Visit: Payer: Self-pay

## 2022-02-07 ENCOUNTER — Encounter: Payer: Self-pay | Admitting: Family Medicine

## 2022-02-07 VITALS — BP 138/84 | HR 92 | Ht 67.5 in | Wt 245.0 lb

## 2022-02-07 DIAGNOSIS — E039 Hypothyroidism, unspecified: Secondary | ICD-10-CM | POA: Diagnosis not present

## 2022-02-07 DIAGNOSIS — G4733 Obstructive sleep apnea (adult) (pediatric): Secondary | ICD-10-CM

## 2022-02-07 DIAGNOSIS — E1169 Type 2 diabetes mellitus with other specified complication: Secondary | ICD-10-CM | POA: Diagnosis not present

## 2022-02-07 DIAGNOSIS — F1020 Alcohol dependence, uncomplicated: Secondary | ICD-10-CM

## 2022-02-07 DIAGNOSIS — F4321 Adjustment disorder with depressed mood: Secondary | ICD-10-CM

## 2022-02-07 DIAGNOSIS — E113293 Type 2 diabetes mellitus with mild nonproliferative diabetic retinopathy without macular edema, bilateral: Secondary | ICD-10-CM | POA: Diagnosis not present

## 2022-02-07 DIAGNOSIS — Z0000 Encounter for general adult medical examination without abnormal findings: Secondary | ICD-10-CM | POA: Diagnosis not present

## 2022-02-07 DIAGNOSIS — E785 Hyperlipidemia, unspecified: Secondary | ICD-10-CM

## 2022-02-07 MED ORDER — LISINOPRIL 20 MG PO TABS
20.0000 mg | ORAL_TABLET | Freq: Every morning | ORAL | 3 refills | Status: DC
  Filled 2022-02-07: qty 90, 90d supply, fill #0

## 2022-02-07 MED ORDER — LEVOTHYROXINE SODIUM 175 MCG PO TABS
175.0000 ug | ORAL_TABLET | Freq: Every day | ORAL | 3 refills | Status: AC
  Filled 2022-02-07: qty 90, 90d supply, fill #0

## 2022-02-07 MED ORDER — ATORVASTATIN CALCIUM 20 MG PO TABS
20.0000 mg | ORAL_TABLET | Freq: Every day | ORAL | 3 refills | Status: DC
  Filled 2022-02-07: qty 90, 90d supply, fill #0

## 2022-02-07 MED ORDER — OZEMPIC (1 MG/DOSE) 4 MG/3ML ~~LOC~~ SOPN
1.0000 mg | PEN_INJECTOR | SUBCUTANEOUS | 5 refills | Status: DC
  Filled 2022-02-07: qty 3, 28d supply, fill #0

## 2022-02-07 NOTE — Patient Instructions (Addendum)
Thank you for coming to the office today.  Recent Labs    06/15/21 0947 09/06/21 0815 01/31/22 0857  HGBA1C 8.9* 8.7* 9.1*    Resume Ozempic '1mg'$  weekly  We can adjust metformin in future if still elevated 3 months.  Try to keep up with AA and maybe need therapist again in future.  All meds refilled  Please schedule a Follow-up Appointment to: Return in about 3 months (around 05/10/2022) for 3 month DM A1c.  If you have any other questions or concerns, please feel free to call the office or send a message through [place]. You may also schedule an earlier appointment if necessary.  Additionally, you may be receiving a survey about your experience at our office within a few days to 1 week by e-mail or mail. We value your feedback.  [name], DO [place]

## 2022-02-07 NOTE — Assessment & Plan Note (Signed)
Previously on CPAP Goal to adhere to therapy to continue

## 2022-02-07 NOTE — Assessment & Plan Note (Signed)
Abnormal labs TSH >35, not adhering to med Restart therapy Levothyroxine 154mg daily Emphasis on adherence

## 2022-02-07 NOTE — Progress Notes (Signed)
Subjective:    Patient ID: Shane Picket., male    DOB: Aug 24, 1965, 56 y.o.   MRN: [id number]  Shane Graefe. is a 56 y.o. male presenting on 02/07/2022 for Annual Exam   HPI  Here for Annual Physical and Lab Review  CHRONIC DM, Type 2: Morbid Obesity BMI >37 Hyperlipidemia  A1c up to 9.1, previously 8.7 to 8.9 He admits lifestyle is not on track LDL shows up to 155, previously 128-136 He has done more with juicing, garlic, tumeric, ginger, red onion CBG readings 140-180, has fluctuated recently up to 300+, can drop fast Off Ozempic intermittently due to adherence CBGs: Home readings w [employer]estyle Lite new testing supplies recently Meds: Ozempic 34m weekly injection, Metformin 5012mBID Tolerating well w/o side-effects Currently on ACEi Lifestyle: - Diet (trying to improve diabetic diet) - 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 Missed some doses previously Current Meds - Lisinopril 204maily, Diltiazem LA 420m55mily - Off HCTZ Reports good compliance, took meds today. Tolerating well, w/o complaints. Denies CP, dyspnea, HA, edema, dizziness / lightheadedness   Alcohol Dependence, resumed [place] - Currently liquor 3 pints per week - He goes to AA [employer] has support system  Hypothyroidism Not adhering to thyroid medication Previous dose Levothyroxine 175mc100mdmits missing doses TSH >35 and Free T 0.8, down from 1.9 prior   Adjustment Disorder, depressed mood Due to relationship issues separation, he is depressed, but he admits he does not have any intent for self harm. He has support system with church.  He has own RV living  location now   Health Maintenance: Declines vaccine.     02/07/2022    3:35 PM 09/06/2021    8:33 AM 06/15/2021    9:12 AM  Depression screen PHQ 2/9  Decreased Interest _0 Down, Depressed, Hopeless _1 PHQ - 2 Score _2 Altered sleeping 2 2 0  Tired, decreased energy 1 1 0  Change in appetite 0 0 0  Feeling bad or failure about yourself  0 0 3  Trouble concentrating 1 1 0  Moving slowly or fidgety/restless 0 0 0  Suicidal thoughts 0 0 0  PHQ-9 Score _3 Difficult doing work/chores Somewhat difficult Somewhat difficult Not difficult at all    Past Medical History:  Diagnosis Date   CHF (congestive heart failure), NYHA class I, acute on chronic, systolic (HCC) [place] 2017   cause of seizure after hit head at work   Difficult intubation    patient unaware of this diagnosis   ED (erectile dysfunction)    Hypothyroidism    Seizures (HCC) [place]-17   X 1-PT HIT HIS HEAD AT WORK AND THEN HAD A SEIZURE   Past Surgical History:  Procedure Laterality Date   COLONOSCOPY WITH PROPOFOL N/A 12/07/2018   Procedure: COLONOSCOPY WITH PROPOFOL;  Surgeon: Anna,Jonathon Bellows  [facility];  Service: Gastroenterology;  Laterality: N/A;   FLEXIBLE BRONCHOSCOPY N/A 03/27/2018   Procedure: FLEX[place] C-arm;  Surgeon: SimonWilhelmina Mcardle  Location: [facility];  Service: Pulmonary;  Laterality: N/A;  HERNIA REPAIR     as a child,umbilical   SHOULDER ARTHROSCOPY WITH ROTATOR CUFF REPAIR AND SUBACROMIAL DECOMPRESSION Left 09/09/2015   Procedure: SHOULDER ARTHROSCOPY, SUBACROMIAL DECOMPRESSION,BICEPS TENSON RELEASE AND MINI OPEN ROTATOR CUFF REPAIR;  Surgeon: Leanor Kail, MD;  Location: [facility];  Service: Orthopedics;  Laterality: Left;   VENTRAL HERNIA REPAIR N/A 10/27/2017   Procedure: HERNIA REPAIR VENTRAL ADULT;  Surgeon: Jules Husbands, MD;  Location: [facility];  Service: General;  Laterality: N/A;   Social History   Socioeconomic  History   Marital status: Married    Spouse name: Not on file   Number of children: Not on file   Years of education: Not on file   Highest education level: Not on file  Occupational History   Not on file  Tobacco Use   Smoking status: Never   Smokeless tobacco: Current    Types: Chew  Vaping Use   Vaping Use: Never used  Substance and Sexual Activity   Alcohol use: Not Currently    Alcohol/week: 8.0 standard drinks of alcohol    Types: 8 Shots of liquor per week    Comment: occasionally. rarely   Drug use: No   Sexual activity: Yes  Other Topics Concern   Not on file  Social History Narrative   Not on file   Social Determinants of Health   Financial Resource Strain: Not on file  Food Insecurity: Not on file  Transportation Needs: Not on file  Physical Activity: Not on file  Stress: Not on file  Social Connections: Not on file  Intimate Partner Violence: Not on file   Family History  Problem Relation Age of Onset   Hypertension Mother    Cancer Father    Diabetes Sister    Thyroid cancer Sister    Current Outpatient Medications on File Prior to Visit  Medication Sig   Blood Glucose Calibration (FREESTYLE CONTROL SOLUTION) LIQD Use to calibrate glucometer to check blood sugar   Blood Glucose Monitoring Suppl (FREESTYLE LITE) DEVI Use to check blood sugar up to 2 x daily   diltiazem (TIAZAC) 420 MG 24 hr capsule Take 1 capsule (420 mg total) by mouth daily.   metFORMIN (GLUCOPHAGE) 500 MG tablet TAKE 1 TABLET (500 MG TOTAL) BY MOUTH 2 (TWO) TIMES DAILY WITH A MEAL.   tadalafil (CIALIS) 20 MG tablet 1 tab po 1 hour prior to intercourse   diclofenac Sodium (VOLTAREN) 1 % GEL Apply 2 g topically 3 (three) times daily as needed (left knee pain arthritis). (Patient not taking: Reported on 03/15/2021)   No current facility-administered medications on file prior to visit.    Review of Systems  Constitutional:  Negative for activity change, appetite change, chills,  diaphoresis, fatigue and fever.  HENT:  Negative for congestion and hearing loss.   Eyes:  Negative for visual disturbance.  Respiratory:  Negative for cough, chest tightness, shortness of breath and wheezing.   Cardiovascular:  Negative for chest pain, palpitations and leg swelling.  Gastrointestinal:  Negative for abdominal pain, constipation, diarrhea, nausea and vomiting.  Genitourinary:  Negative for dysuria, frequency and hematuria.  Musculoskeletal:  Negative for arthralgias and neck pain.  Skin:  Negative for rash.  Neurological:  Negative for dizziness, weakness, light-headedness, numbness and headaches.  Hematological:  Negative for adenopathy.  Psychiatric/Behavioral:  Negative for behavioral problems, dysphoric mood and sleep disturbance.    Per HPI unless specifically indicated above     Objective:    BP 138/84 (BP Location:  Left Arm, Cuff Size: Normal)   Pulse 92   Ht 5' 7.5" (1.715 m)   Wt 245 lb (111.1 kg)   SpO2 98%   BMI 37.81 kg/m   Wt Readings from Last 3 Encounters:  02/07/22 245 lb (111.1 kg)  09/06/21 242 lb 3.2 oz (109.9 kg)  06/15/21 244 lb 3.2 oz (110.8 kg)    Physical Exam Vitals and nursing note reviewed.  Constitutional:      General: He is not in acute distress.    Appearance: He is well-developed. He is obese. He is not diaphoretic.     Comments: Well-appearing, comfortable, cooperative  HENT:     Head: Normocephalic and atraumatic.  Eyes:     General:        Right eye: No discharge.        Left eye: No discharge.     Conjunctiva/sclera: Conjunctivae normal.     Pupils: Pupils are equal, round, and reactive to light.  Neck:     Thyroid: No thyromegaly.  Cardiovascular:     Rate and Rhythm: Normal rate and regular rhythm.     Pulses: Normal pulses.     Heart sounds: Normal heart sounds. No murmur heard. Pulmonary:     Effort: Pulmonary effort is normal. No respiratory distress.     Breath sounds: Normal breath sounds. No wheezing or  rales.  Abdominal:     General: Bowel sounds are normal. There is no distension.     Palpations: Abdomen is soft. There is no mass.     Tenderness: There is no abdominal tenderness.  Musculoskeletal:        General: No tenderness. Normal range of motion.     Cervical back: Normal range of motion and neck supple.     Right lower leg: No edema.     Left lower leg: No edema.     Comments: Upper / Lower Extremities: - Normal muscle tone, strength bilateral upper extremities 5/5, lower extremities 5/5  Lymphadenopathy:     Cervical: No cervical adenopathy.  Skin:    General: Skin is warm and dry.     Findings: No erythema or rash.  Neurological:     Mental Status: He is alert and oriented to person, place, and time.     Comments: Distal sensation intact to light touch all extremities  Psychiatric:        Mood and Affect: Mood normal.        Behavior: Behavior normal.        Thought Content: Thought content normal.     Comments: Well groomed, good eye contact, normal speech and thoughts      Results for orders placed or performed in visit on 01/28/22  T4, free  Result Value Ref Range   Free T4 0.8 0.8 - 1.8 ng/dL  TSH  Result Value Ref Range   TSH 35.00 (H) 0.40 - 4.50 mIU/L  PSA  Result Value Ref Range   PSA 0.18 < OR = 4.00 ng/mL  Hemoglobin A1c  Result Value Ref Range   Hgb A1c MFr Bld 9.1 (H) <5.7 % of total Hgb   Mean Plasma Glucose 214 mg/dL   eAG (mmol/L) 11.9 mmol/L  Lipid panel  Result Value Ref Range   Cholesterol 245 (H) <200 mg/dL   HDL 74 > OR = 40 mg/dL   Triglycerides 67 <150 mg/dL   LDL Cholesterol (Calc) 155 (H) mg/dL (calc)   Total CHOL/HDL Ratio 3.3 <5.0 (calc)   Non-HDL  Cholesterol (Calc) 171 (H) <130 mg/dL (calc)  CBC with Differential/Platelet  Result Value Ref Range   WBC 4.3 3.8 - 10.8 Thousand/uL   RBC 4.91 4.20 - 5.80 Million/uL   Hemoglobin 15.2 13.2 - 17.1 g/dL   HCT 44.2 38.5 - 50.0 %   MCV 90.0 80.0 - 100.0 fL   MCH 31.0 27.0 - 33.0  pg   MCHC 34.4 32.0 - 36.0 g/dL   RDW 12.6 11.0 - 15.0 %   Platelets 252 140 - 400 Thousand/uL   MPV 11.5 7.5 - 12.5 fL   Neutro Abs 2,516 1,500 - 7,800 cells/uL   Lymphs Abs 1,294 850 - 3,900 cells/uL   Absolute Monocytes 340 200 - 950 cells/uL   Eosinophils Absolute 120 15 - 500 cells/uL   Basophils Absolute 30 0 - 200 cells/uL   Neutrophils Relative % 58.5 %   Total Lymphocyte 30.1 %   Monocytes Relative 7.9 %   Eosinophils Relative 2.8 %   Basophils Relative 0.7 %  COMPLETE METABOLIC PANEL WITH GFR  Result Value Ref Range   Glucose, Bld 234 (H) 65 - 99 mg/dL   BUN 15 7 - 25 mg/dL   Creat 1.06 0.70 - 1.30 mg/dL   eGFR 83 > OR = 60 mL/min/1.15m   BUN/Creatinine Ratio SEE NOTE: 6 - 22 (calc)   Sodium 138 135 - 146 mmol/L   Potassium 3.8 3.5 - 5.3 mmol/L   Chloride 100 98 - 110 mmol/L   CO2 27 20 - 32 mmol/L   Calcium 9.3 8.6 - 10.3 mg/dL   Total Protein 7.4 6.1 - 8.1 g/dL   Albumin 4.1 3.6 - 5.1 g/dL   Globulin 3.3 1.9 - 3.7 g/dL (calc)   AG Ratio 1.2 1.0 - 2.5 (calc)   Total Bilirubin 0.5 0.2 - 1.2 mg/dL   Alkaline phosphatase (APISO) 71 35 - 144 U/L   AST 15 10 - 35 U/L   ALT 16 9 - 46 U/L      Assessment & Plan:   Problem List Items Addressed This Visit     Adjustment disorder with depressed mood   Hyperlipidemia associated with type 2 diabetes mellitus (HCC)    Elevated LDL >150  Plan: 1. Continue current meds - Atorvastatin 254mdaily refill 2. Encourage improved lifestyle - low carb/cholesterol, reduce portion size, continue improving regular exercise      Relevant Medications   atorvastatin (LIPITOR) 20 MG tablet   lisinopril (ZESTRIL) 20 MG tablet   OZEMPIC, 1 MG/DOSE, 4 MG/3ML SOPN   Hypothyroidism    Abnormal labs TSH >35, not adhering to med Restart therapy Levothyroxine 17561mdaily Emphasis on adherence      Relevant Medications   levothyroxine (SYNTHROID) 175 MCG tablet   Morbid obesity (HCC)   Relevant Medications   OZEMPIC, 1 MG/DOSE,  4 MG/3ML SOPN   OSA on CPAP    Previously on CPAP Goal to adhere to therapy to continue      Type 2 diabetes mellitus with diabetic retinopathy (HCC)    Elevated A1c from 8.9 to 9.1 On ozempic GLP1 but not adhering to med regularly, has med With some hyperglycemia still reported on CBGs Without Hypoglycemia Complicated by DM retinopathy  Emphasis on lifestyle diet overhaul, and reduce alcohol to help control sugars  Plan:  1. Re order Ozempic 1mg83mekly inj, consider dose inc next visit to 2mg 68mindicated - Consider Mounjaro if covered in future 2. Continue Metformin 500 BID - may dose adjust  in future if better control on GLP1 3. Encourage improved lifestyle - low carb, low sugar diet, reduce portion size, continue improving regular exercise 4. Check CBG, bring log to next visit for review      Relevant Medications   atorvastatin (LIPITOR) 20 MG tablet   lisinopril (ZESTRIL) 20 MG tablet   OZEMPIC, 1 MG/DOSE, 4 MG/3ML SOPN   Other Relevant Orders   Urine Microalbumin w/creat. ratio   Uncomplicated alcohol dependence ([place])   Other Visit Diagnoses     Annual physical exam    -  Primary       Updated Health Maintenance information Reviewed recent lab results with patient Encouraged improvement to lifestyle with diet and exercise Goal of weight loss Declines vaccines  Emphasis on Alcohol reduction and cessation Continue AA meetings Discuss therapy options and support system, he agrees to follow up if needed Offer medication such as Naltrexone, he declines today   Meds ordered this encounter  Medications   atorvastatin (LIPITOR) 20 MG tablet    Sig: Take 1 tablet (20 mg total) by mouth at bedtime.    Dispense:  90 tablet    Refill:  3   levothyroxine (SYNTHROID) 175 MCG tablet    Sig: Take 1 tablet (175 mcg total) by mouth daily before breakfast.    Dispense:  90 tablet    Refill:  3   lisinopril (ZESTRIL) 20 MG tablet    Sig: Take 1 tablet (20 mg total) by  mouth every morning.    Dispense:  90 tablet    Refill:  3   OZEMPIC, 1 MG/DOSE, 4 MG/3ML SOPN    Sig: Inject 1 mg into the skin once a week.    Dispense:  3 mL    Refill:  5    Follow up plan: Return in about 3 months (around 05/10/2022) for 3 month DM A1c.  [name], [facility] 02/07/2022, 3:12 PM

## 2022-02-07 NOTE — Assessment & Plan Note (Addendum)
Elevated A1c from 8.9 to 9.1 On ozempic GLP1 but not adhering to med regularly, has med With some hyperglycemia still reported on CBGs Without Hypoglycemia Complicated by DM retinopathy  Emphasis on lifestyle diet overhaul, and reduce alcohol to help control sugars  Plan:  1. Re order Ozempic '1mg'$  weekly inj, consider dose inc next visit to '2mg'$  if indicated - Consider Mounjaro if covered in future 2. Continue Metformin 500 BID - may dose adjust in future if better control on GLP1 3. Encourage improved lifestyle - low carb, low sugar diet, reduce portion size, continue improving regular exercise 4. Check CBG, bring log to next visit for review

## 2022-02-07 NOTE — Assessment & Plan Note (Signed)
Elevated LDL >150  Plan: 1. Continue current meds - Atorvastatin '20mg'$  daily refill 2. Encourage improved lifestyle - low carb/cholesterol, reduce portion size, continue improving regular exercise

## 2022-02-08 LAB — MICROALBUMIN / CREATININE URINE RATIO
Creatinine, Urine: 365 mg/dL — ABNORMAL HIGH (ref 20–320)
Microalb Creat Ratio: 79 mcg/mg creat — ABNORMAL HIGH (ref <30)
Microalb, Ur: 28.9 mg/dL

## 2022-02-25 ENCOUNTER — Other Ambulatory Visit: Payer: Self-pay

## 2022-05-16 ENCOUNTER — Ambulatory Visit: Payer: No Typology Code available for payment source | Admitting: Family Medicine

## 2022-08-09 ENCOUNTER — Emergency Department: Payer: 59

## 2022-08-09 ENCOUNTER — Encounter: Payer: Self-pay | Admitting: Emergency Medicine

## 2022-08-09 ENCOUNTER — Encounter
Admission: EM | Disposition: A | Discharge status: Home / Self Care | Payer: Self-pay | Source: Home / Self Care | Attending: Internal Medicine

## 2022-08-09 ENCOUNTER — Inpatient Hospital Stay: Payer: 59

## 2022-08-09 ENCOUNTER — Other Ambulatory Visit (HOSPITAL_COMMUNITY): Payer: Self-pay

## 2022-08-09 ENCOUNTER — Inpatient Hospital Stay
Admission: EM | Admit: 2022-08-09 | Discharge: 2022-08-12 | DRG: 321 | Disposition: A | Discharge status: Home / Self Care | Payer: 59 | Attending: Internal Medicine | Admitting: Internal Medicine

## 2022-08-09 ENCOUNTER — Other Ambulatory Visit: Payer: Self-pay

## 2022-08-09 DIAGNOSIS — F1010 Alcohol abuse, uncomplicated: Secondary | ICD-10-CM | POA: Diagnosis present

## 2022-08-09 DIAGNOSIS — I2102 ST elevation (STEMI) myocardial infarction involving left anterior descending coronary artery: Principal | ICD-10-CM | POA: Diagnosis present

## 2022-08-09 DIAGNOSIS — E1165 Type 2 diabetes mellitus with hyperglycemia: Secondary | ICD-10-CM | POA: Diagnosis present

## 2022-08-09 DIAGNOSIS — E785 Hyperlipidemia, unspecified: Secondary | ICD-10-CM | POA: Diagnosis present

## 2022-08-09 DIAGNOSIS — Z79899 Other long term (current) drug therapy: Secondary | ICD-10-CM

## 2022-08-09 DIAGNOSIS — I10 Essential (primary) hypertension: Secondary | ICD-10-CM | POA: Diagnosis present

## 2022-08-09 DIAGNOSIS — Z79890 Hormone replacement therapy (postmenopausal): Secondary | ICD-10-CM

## 2022-08-09 DIAGNOSIS — Z9861 Coronary angioplasty status: Secondary | ICD-10-CM | POA: Diagnosis not present

## 2022-08-09 DIAGNOSIS — I5020 Unspecified systolic (congestive) heart failure: Secondary | ICD-10-CM

## 2022-08-09 DIAGNOSIS — I5023 Acute on chronic systolic (congestive) heart failure: Secondary | ICD-10-CM | POA: Diagnosis present

## 2022-08-09 DIAGNOSIS — E039 Hypothyroidism, unspecified: Secondary | ICD-10-CM | POA: Diagnosis not present

## 2022-08-09 DIAGNOSIS — Z6837 Body mass index (BMI) 37.0-37.9, adult: Secondary | ICD-10-CM | POA: Diagnosis not present

## 2022-08-09 DIAGNOSIS — E876 Hypokalemia: Secondary | ICD-10-CM | POA: Diagnosis present

## 2022-08-09 DIAGNOSIS — E11319 Type 2 diabetes mellitus with unspecified diabetic retinopathy without macular edema: Secondary | ICD-10-CM | POA: Diagnosis present

## 2022-08-09 DIAGNOSIS — I2510 Atherosclerotic heart disease of native coronary artery without angina pectoris: Secondary | ICD-10-CM | POA: Diagnosis present

## 2022-08-09 DIAGNOSIS — Z808 Family history of malignant neoplasm of other organs or systems: Secondary | ICD-10-CM

## 2022-08-09 DIAGNOSIS — I38 Endocarditis, valve unspecified: Secondary | ICD-10-CM | POA: Diagnosis not present

## 2022-08-09 DIAGNOSIS — G4733 Obstructive sleep apnea (adult) (pediatric): Secondary | ICD-10-CM

## 2022-08-09 DIAGNOSIS — I252 Old myocardial infarction: Secondary | ICD-10-CM | POA: Insufficient documentation

## 2022-08-09 DIAGNOSIS — M7981 Nontraumatic hematoma of soft tissue: Secondary | ICD-10-CM | POA: Diagnosis not present

## 2022-08-09 DIAGNOSIS — Z23 Encounter for immunization: Secondary | ICD-10-CM

## 2022-08-09 DIAGNOSIS — Z7984 Long term (current) use of oral hypoglycemic drugs: Secondary | ICD-10-CM

## 2022-08-09 DIAGNOSIS — I110 Hypertensive heart disease with heart failure: Secondary | ICD-10-CM | POA: Diagnosis present

## 2022-08-09 DIAGNOSIS — Z833 Family history of diabetes mellitus: Secondary | ICD-10-CM | POA: Diagnosis not present

## 2022-08-09 DIAGNOSIS — Z8249 Family history of ischemic heart disease and other diseases of the circulatory system: Secondary | ICD-10-CM

## 2022-08-09 DIAGNOSIS — I213 ST elevation (STEMI) myocardial infarction of unspecified site: Principal | ICD-10-CM

## 2022-08-09 DIAGNOSIS — I5042 Chronic combined systolic (congestive) and diastolic (congestive) heart failure: Secondary | ICD-10-CM | POA: Diagnosis not present

## 2022-08-09 DIAGNOSIS — F1090 Alcohol use, unspecified, uncomplicated: Secondary | ICD-10-CM | POA: Diagnosis present

## 2022-08-09 DIAGNOSIS — Z789 Other specified health status: Secondary | ICD-10-CM | POA: Diagnosis present

## 2022-08-09 DIAGNOSIS — I5043 Acute on chronic combined systolic (congestive) and diastolic (congestive) heart failure: Secondary | ICD-10-CM | POA: Diagnosis present

## 2022-08-09 DIAGNOSIS — E669 Obesity, unspecified: Secondary | ICD-10-CM | POA: Diagnosis present

## 2022-08-09 DIAGNOSIS — R079 Chest pain, unspecified: Secondary | ICD-10-CM | POA: Diagnosis not present

## 2022-08-09 DIAGNOSIS — Z7985 Long-term (current) use of injectable non-insulin antidiabetic drugs: Secondary | ICD-10-CM

## 2022-08-09 DIAGNOSIS — R0789 Other chest pain: Secondary | ICD-10-CM | POA: Diagnosis not present

## 2022-08-09 DIAGNOSIS — F17220 Nicotine dependence, chewing tobacco, uncomplicated: Secondary | ICD-10-CM | POA: Diagnosis present

## 2022-08-09 DIAGNOSIS — I429 Cardiomyopathy, unspecified: Secondary | ICD-10-CM | POA: Diagnosis not present

## 2022-08-09 DIAGNOSIS — E1169 Type 2 diabetes mellitus with other specified complication: Secondary | ICD-10-CM | POA: Diagnosis not present

## 2022-08-09 HISTORY — PX: CORONARY/GRAFT ACUTE MI REVASCULARIZATION: CATH118305

## 2022-08-09 HISTORY — PX: LEFT HEART CATH AND CORONARY ANGIOGRAPHY: CATH118249

## 2022-08-09 LAB — COMPREHENSIVE METABOLIC PANEL
ALT: 19 U/L (ref 0–44)
AST: 22 U/L (ref 15–41)
Albumin: 4.1 g/dL (ref 3.5–5.0)
Alkaline Phosphatase: 62 U/L (ref 38–126)
Anion gap: 9 (ref 5–15)
BUN: 17 mg/dL (ref 6–20)
CO2: 27 mmol/L (ref 22–32)
Calcium: 9.7 mg/dL (ref 8.9–10.3)
Chloride: 103 mmol/L (ref 98–111)
Creatinine, Ser: 1.25 mg/dL — ABNORMAL HIGH (ref 0.61–1.24)
GFR, Estimated: >60 mL/min (ref >60)
Glucose, Bld: 222 mg/dL — ABNORMAL HIGH (ref 70–99)
Potassium: 3.6 mmol/L (ref 3.5–5.1)
Sodium: 139 mmol/L (ref 135–145)
Total Bilirubin: 0.5 mg/dL (ref 0.3–1.2)
Total Protein: 8 g/dL (ref 6.5–8.1)

## 2022-08-09 LAB — APTT: aPTT: 26 seconds (ref 24–36)

## 2022-08-09 LAB — MRSA NEXT GEN BY PCR, NASAL: MRSA by PCR Next Gen: NOT DETECTED

## 2022-08-09 LAB — CBC WITH DIFFERENTIAL/PLATELET
Abs Immature Granulocytes: 0.01 10*3/uL (ref 0.00–0.07)
Basophils Absolute: 0 10*3/uL (ref 0.0–0.1)
Basophils Relative: 0 %
Eosinophils Absolute: 0.1 10*3/uL (ref 0.0–0.5)
Eosinophils Relative: 2 %
HCT: 47 % (ref 39.0–52.0)
Hemoglobin: 15.4 g/dL (ref 13.0–17.0)
Immature Granulocytes: 0 %
Lymphocytes Relative: 30 %
Lymphs Abs: 1.2 10*3/uL (ref 0.7–4.0)
MCH: 29.6 pg (ref 26.0–34.0)
MCHC: 32.8 g/dL (ref 30.0–36.0)
MCV: 90.4 fL (ref 80.0–100.0)
Monocytes Absolute: 0.2 10*3/uL (ref 0.1–1.0)
Monocytes Relative: 6 %
Neutro Abs: 2.3 10*3/uL (ref 1.7–7.7)
Neutrophils Relative %: 62 %
Platelets: 227 10*3/uL (ref 150–400)
RBC: 5.2 MIL/uL (ref 4.22–5.81)
RDW: 13 % (ref 11.5–15.5)
WBC: 3.8 10*3/uL — ABNORMAL LOW (ref 4.0–10.5)
nRBC: 0 % (ref 0.0–0.2)

## 2022-08-09 LAB — BRAIN NATRIURETIC PEPTIDE: B Natriuretic Peptide: 177.4 pg/mL — ABNORMAL HIGH (ref 0.0–100.0)

## 2022-08-09 LAB — HEMOGLOBIN A1C
Hgb A1c MFr Bld: 10.3 % — ABNORMAL HIGH (ref 4.8–5.6)
Mean Plasma Glucose: 249 mg/dL

## 2022-08-09 LAB — TROPONIN I (HIGH SENSITIVITY)
Troponin I (High Sensitivity): 47 ng/L — ABNORMAL HIGH (ref <18)
Troponin I (High Sensitivity): 2607 ng/L (ref <18)

## 2022-08-09 LAB — LIPID PANEL
Cholesterol: 268 mg/dL — ABNORMAL HIGH (ref 0–200)
HDL: 66 mg/dL (ref >40)
LDL Cholesterol: 181 mg/dL — ABNORMAL HIGH (ref 0–99)
Total CHOL/HDL Ratio: 4.1 RATIO
Triglycerides: 103 mg/dL (ref <150)
VLDL: 21 mg/dL (ref 0–40)

## 2022-08-09 LAB — GLUCOSE, CAPILLARY
Glucose-Capillary: 170 mg/dL — ABNORMAL HIGH (ref 70–99)
Glucose-Capillary: 183 mg/dL — ABNORMAL HIGH (ref 70–99)

## 2022-08-09 LAB — PROTIME-INR
INR: 1 (ref 0.8–1.2)
Prothrombin Time: 13.4 seconds (ref 11.4–15.2)

## 2022-08-09 LAB — POCT ACTIVATED CLOTTING TIME: Activated Clotting Time: 293 seconds

## 2022-08-09 SURGERY — CORONARY/GRAFT ACUTE MI REVASCULARIZATION
Anesthesia: Moderate Sedation

## 2022-08-09 MED ORDER — VERAPAMIL HCL 2.5 MG/ML IV SOLN
INTRAVENOUS | Status: DC | PRN
  Administered 2022-08-09: 2.5 mg via INTRAVENOUS

## 2022-08-09 MED ORDER — MORPHINE SULFATE (PF) 2 MG/ML IV SOLN: 2.0000 mg | INTRAVENOUS | Status: DC | PRN

## 2022-08-09 MED ORDER — LABETALOL HCL 5 MG/ML IV SOLN: 10.0000 mg | INTRAVENOUS | Status: AC | PRN

## 2022-08-09 MED ORDER — MIDAZOLAM HCL 2 MG/2ML IJ SOLN
INTRAMUSCULAR | Status: AC
  Filled 2022-08-09: qty 2

## 2022-08-09 MED ORDER — INSULIN ASPART 100 UNIT/ML IJ SOLN: 0.0000 [IU] | Freq: Every day | INTRAMUSCULAR | Status: DC

## 2022-08-09 MED ORDER — FENTANYL CITRATE (PF) 100 MCG/2ML IJ SOLN
INTRAMUSCULAR | Status: DC | PRN
  Administered 2022-08-09: 50 ug via INTRAVENOUS

## 2022-08-09 MED ORDER — ATORVASTATIN CALCIUM 80 MG PO TABS
80.0000 mg | ORAL_TABLET | Freq: Every day | ORAL | Status: DC
  Administered 2022-08-09 – 2022-08-11 (×3): 80 mg via ORAL
  Filled 2022-08-09 (×3): qty 1
  Filled 2022-08-09: qty 4

## 2022-08-09 MED ORDER — LORAZEPAM 1 MG PO TABS: 1.0000 mg | ORAL_TABLET | ORAL | Status: AC | PRN

## 2022-08-09 MED ORDER — TICAGRELOR 90 MG PO TABS
ORAL_TABLET | ORAL | Status: AC
  Filled 2022-08-09: qty 2

## 2022-08-09 MED ORDER — NITROGLYCERIN 1 MG/10 ML FOR IR/CATH LAB
INTRA_ARTERIAL | Status: AC
  Filled 2022-08-09: qty 10

## 2022-08-09 MED ORDER — FUROSEMIDE 10 MG/ML IJ SOLN
INTRAMUSCULAR | Status: DC | PRN
  Administered 2022-08-09: 40 mg via INTRAVENOUS

## 2022-08-09 MED ORDER — NITROGLYCERIN 0.4 MG SL SUBL: 0.4000 mg | SUBLINGUAL_TABLET | SUBLINGUAL | Status: DC | PRN

## 2022-08-09 MED ORDER — CANGRELOR BOLUS VIA INFUSION
INTRAVENOUS | Status: DC | PRN
  Administered 2022-08-09: 3333 ug via INTRAVENOUS

## 2022-08-09 MED ORDER — HEPARIN SODIUM (PORCINE) 1000 UNIT/ML IJ SOLN
4000.0000 [IU] | Freq: Once | INTRAMUSCULAR | Status: AC
  Administered 2022-08-09: 4000 [IU] via INTRAVENOUS
  Filled 2022-08-09: qty 4

## 2022-08-09 MED ORDER — SODIUM CHLORIDE 0.9 % IV SOLN: INTRAVENOUS | Status: DC

## 2022-08-09 MED ORDER — TICAGRELOR 90 MG PO TABS
90.0000 mg | ORAL_TABLET | Freq: Two times a day (BID) | ORAL | Status: DC
  Administered 2022-08-09 – 2022-08-12 (×6): 90 mg via ORAL
  Filled 2022-08-09 (×6): qty 1

## 2022-08-09 MED ORDER — CHLORHEXIDINE GLUCONATE CLOTH 2 % EX PADS
6.0000 | MEDICATED_PAD | Freq: Every day | CUTANEOUS | Status: DC
  Administered 2022-08-09 – 2022-08-12 (×4): 6 via TOPICAL

## 2022-08-09 MED ORDER — POTASSIUM CHLORIDE CRYS ER 20 MEQ PO TBCR
40.0000 meq | EXTENDED_RELEASE_TABLET | Freq: Once | ORAL | Status: AC
  Administered 2022-08-09: 40 meq via ORAL
  Filled 2022-08-09: qty 2

## 2022-08-09 MED ORDER — MIDAZOLAM HCL 2 MG/2ML IJ SOLN
INTRAMUSCULAR | Status: DC | PRN
  Administered 2022-08-09: 1 mg via INTRAVENOUS

## 2022-08-09 MED ORDER — SODIUM CHLORIDE 0.9 % IV SOLN
4.0000 ug/kg/min | INTRAVENOUS | Status: AC
  Filled 2022-08-09: qty 50

## 2022-08-09 MED ORDER — FOLIC ACID 1 MG PO TABS
1.0000 mg | ORAL_TABLET | Freq: Every day | ORAL | Status: DC
  Administered 2022-08-09 – 2022-08-12 (×4): 1 mg via ORAL
  Filled 2022-08-09 (×4): qty 1

## 2022-08-09 MED ORDER — LORAZEPAM 2 MG/ML IJ SOLN: 0.0000 mg | Freq: Four times a day (QID) | INTRAMUSCULAR | Status: AC

## 2022-08-09 MED ORDER — CARVEDILOL 3.125 MG PO TABS
3.1250 mg | ORAL_TABLET | Freq: Two times a day (BID) | ORAL | Status: DC
  Administered 2022-08-09 – 2022-08-12 (×6): 3.125 mg via ORAL
  Filled 2022-08-09 (×6): qty 1

## 2022-08-09 MED ORDER — FENTANYL CITRATE (PF) 100 MCG/2ML IJ SOLN
INTRAMUSCULAR | Status: AC
  Filled 2022-08-09: qty 2

## 2022-08-09 MED ORDER — HYDRALAZINE HCL 20 MG/ML IJ SOLN
10.0000 mg | INTRAMUSCULAR | Status: AC | PRN
  Filled 2022-08-09: qty 1

## 2022-08-09 MED ORDER — CANGRELOR TETRASODIUM 50 MG IV SOLR
INTRAVENOUS | Status: AC
  Filled 2022-08-09: qty 50

## 2022-08-09 MED ORDER — THIAMINE HCL 100 MG/ML IJ SOLN: 100.0000 mg | Freq: Every day | INTRAMUSCULAR | Status: DC

## 2022-08-09 MED ORDER — ADULT MULTIVITAMIN W/MINERALS CH
1.0000 | ORAL_TABLET | Freq: Every day | ORAL | Status: DC
  Administered 2022-08-09 – 2022-08-12 (×4): 1 via ORAL
  Filled 2022-08-09 (×4): qty 1

## 2022-08-09 MED ORDER — SODIUM CHLORIDE 0.9 % IV SOLN
INTRAVENOUS | Status: DC | PRN
  Administered 2022-08-09: 4 ug/kg/min via INTRAVENOUS

## 2022-08-09 MED ORDER — ACETAMINOPHEN 325 MG PO TABS: 650.0000 mg | ORAL_TABLET | ORAL | Status: DC | PRN

## 2022-08-09 MED ORDER — LEVOTHYROXINE SODIUM 50 MCG PO TABS
175.0000 ug | ORAL_TABLET | Freq: Every day | ORAL | Status: DC
  Administered 2022-08-10 – 2022-08-12 (×3): 175 ug via ORAL
  Filled 2022-08-09 (×3): qty 1

## 2022-08-09 MED ORDER — THIAMINE MONONITRATE 100 MG PO TABS
100.0000 mg | ORAL_TABLET | Freq: Every day | ORAL | Status: DC
  Administered 2022-08-09 – 2022-08-12 (×4): 100 mg via ORAL
  Filled 2022-08-09 (×4): qty 1

## 2022-08-09 MED ORDER — SODIUM CHLORIDE 0.9% FLUSH: 3.0000 mL | INTRAVENOUS | Status: DC | PRN

## 2022-08-09 MED ORDER — ASPIRIN 81 MG PO CHEW
81.0000 mg | CHEWABLE_TABLET | Freq: Every day | ORAL | Status: DC
  Administered 2022-08-10 – 2022-08-12 (×3): 81 mg via ORAL
  Filled 2022-08-09 (×3): qty 1

## 2022-08-09 MED ORDER — ONDANSETRON HCL 4 MG/2ML IJ SOLN: 4.0000 mg | Freq: Four times a day (QID) | INTRAMUSCULAR | Status: DC | PRN

## 2022-08-09 MED ORDER — LIDOCAINE HCL (PF) 1 % IJ SOLN
INTRAMUSCULAR | Status: DC | PRN
  Administered 2022-08-09: 2 mL

## 2022-08-09 MED ORDER — HEPARIN (PORCINE) IN NACL 2000-0.9 UNIT/L-% IV SOLN
INTRAVENOUS | Status: DC | PRN
  Administered 2022-08-09: 1000 mL

## 2022-08-09 MED ORDER — ENOXAPARIN SODIUM 40 MG/0.4ML IJ SOSY
40.0000 mg | PREFILLED_SYRINGE | INTRAMUSCULAR | Status: DC
  Administered 2022-08-10 – 2022-08-12 (×3): 40 mg via SUBCUTANEOUS
  Filled 2022-08-09 (×3): qty 0.4

## 2022-08-09 MED ORDER — FUROSEMIDE 10 MG/ML IJ SOLN
40.0000 mg | Freq: Two times a day (BID) | INTRAMUSCULAR | Status: AC
  Administered 2022-08-09 – 2022-08-10 (×3): 40 mg via INTRAVENOUS
  Filled 2022-08-09 (×3): qty 4

## 2022-08-09 MED ORDER — FUROSEMIDE 10 MG/ML IJ SOLN
INTRAMUSCULAR | Status: AC
  Filled 2022-08-09: qty 4

## 2022-08-09 MED ORDER — SODIUM CHLORIDE 0.9% FLUSH
3.0000 mL | Freq: Two times a day (BID) | INTRAVENOUS | Status: DC
  Administered 2022-08-09 – 2022-08-12 (×7): 3 mL via INTRAVENOUS

## 2022-08-09 MED ORDER — ACETAMINOPHEN 325 MG PO TABS: 650.0000 mg | ORAL_TABLET | Freq: Four times a day (QID) | ORAL | Status: DC | PRN

## 2022-08-09 MED ORDER — LORAZEPAM 2 MG/ML IJ SOLN: 1.0000 mg | INTRAMUSCULAR | Status: AC | PRN

## 2022-08-09 MED ORDER — TICAGRELOR 90 MG PO TABS
ORAL_TABLET | ORAL | Status: DC | PRN
  Administered 2022-08-09: 180 mg via ORAL

## 2022-08-09 MED ORDER — SODIUM CHLORIDE 0.9 % IV SOLN
250.0000 mL | INTRAVENOUS | Status: DC | PRN
  Administered 2022-08-10: 250 mL via INTRAVENOUS

## 2022-08-09 MED ORDER — NITROGLYCERIN 1 MG/10 ML FOR IR/CATH LAB
INTRA_ARTERIAL | Status: DC | PRN
  Administered 2022-08-09 (×2): 200 ug via INTRACORONARY

## 2022-08-09 MED ORDER — INSULIN ASPART 100 UNIT/ML IJ SOLN
0.0000 [IU] | Freq: Three times a day (TID) | INTRAMUSCULAR | Status: DC
  Administered 2022-08-09 – 2022-08-10 (×2): 4 [IU] via SUBCUTANEOUS
  Administered 2022-08-10 (×2): 7 [IU] via SUBCUTANEOUS
  Administered 2022-08-11 (×2): 3 [IU] via SUBCUTANEOUS
  Administered 2022-08-11: 11 [IU] via SUBCUTANEOUS
  Administered 2022-08-12: 4 [IU] via SUBCUTANEOUS
  Filled 2022-08-09 (×8): qty 1

## 2022-08-09 MED ORDER — ASPIRIN 81 MG PO CHEW: 324.0000 mg | CHEWABLE_TABLET | Freq: Once | ORAL | Status: DC

## 2022-08-09 MED ORDER — LORAZEPAM 2 MG/ML IJ SOLN: 0.0000 mg | Freq: Two times a day (BID) | INTRAMUSCULAR | Status: DC

## 2022-08-09 MED ORDER — IOHEXOL 300 MG/ML  SOLN
INTRAMUSCULAR | Status: DC | PRN
  Administered 2022-08-09: 149 mL

## 2022-08-09 MED ORDER — LOSARTAN POTASSIUM 50 MG PO TABS
50.0000 mg | ORAL_TABLET | Freq: Every day | ORAL | Status: DC
  Administered 2022-08-10: 50 mg via ORAL
  Filled 2022-08-09: qty 1

## 2022-08-09 MED ORDER — HEPARIN SODIUM (PORCINE) 5000 UNIT/ML IJ SOLN: 4000.0000 [IU] | Freq: Once | INTRAMUSCULAR | Status: DC

## 2022-08-09 MED ORDER — HEPARIN SODIUM (PORCINE) 1000 UNIT/ML IJ SOLN
INTRAMUSCULAR | Status: DC | PRN
  Administered 2022-08-09: 2000 [IU] via INTRAVENOUS
  Administered 2022-08-09: 5000 [IU] via INTRAVENOUS

## 2022-08-09 SURGICAL SUPPLY — 19 items
BALLN MINITREK RX 2.0X12 (BALLOONS) ×1
BALLN ~~LOC~~ TREK NEO RX 2.25X12 (BALLOONS) ×1
BALLOON MINITREK RX 2.0X12 (BALLOONS) IMPLANT
BALLOON ~~LOC~~ TREK NEO RX 2.25X12 (BALLOONS) IMPLANT
CATH INFINITI JR4 5F (CATHETERS) IMPLANT
CATH LAUNCHER 6FR EBU3.5 (CATHETERS) IMPLANT
DEVICE RAD COMP TR BAND LRG (VASCULAR PRODUCTS) IMPLANT
DEVICE RAD TR BAND REGULAR (VASCULAR PRODUCTS) IMPLANT
DRAPE BRACHIAL (DRAPES) IMPLANT
GLIDESHEATH SLEND SS 6F .021 (SHEATH) IMPLANT
GUIDEWIRE INQWIRE 1.5J.035X260 (WIRE) IMPLANT
INQWIRE 1.5J .035X260CM (WIRE) ×1
KIT ENCORE 26 ADVANTAGE (KITS) IMPLANT
PACK CARDIAC CATH (CUSTOM PROCEDURE TRAY) ×1 IMPLANT
PROTECTION STATION PRESSURIZED (MISCELLANEOUS) ×1
SET ATX-X65L (MISCELLANEOUS) IMPLANT
STATION PROTECTION PRESSURIZED (MISCELLANEOUS) IMPLANT
STENT ONYX FRONTIER 2.25X18 (Permanent Stent) IMPLANT
WIRE RUNTHROUGH .014X180CM (WIRE) IMPLANT

## 2022-08-09 NOTE — Consult Note (Signed)
 Cardiology Consultation   Patient ID: Shane L Otte Jr. MRN: [id number]; DOB: 01/01/1966  Admit date: 08/09/2022 Date of Consult: 08/09/2022  PCP:  Frazier, Shane J, DO   [facility] Providers Cardiologist:  New - Shane Frazier     Patient Profile:   Shane L Husser Jr. is a 56 y.o. male with a hx of HFrEF by echo in 2019, hypertension, hyperlipidemia, and type 2 diabetes mellitus who is being seen 08/09/2022 for the evaluation of chest pain and abnormal EKG at the request of Dr. Kinner.  History of Present Illness:   Shane Frazier was in his usual state of health until ~3 hours ago.  He suddenly developed left-sided chest pain that he described initially as a pressure-like sensation but subsequently stabbing with radiation to the left arm and left neck.  He was about to get in his truck to go to work.  The pain persisted, prompting him to call 911.  When EMS arrived, he received aspirin and nitroglycerin without resolution of chest pain.  EKG showed anterolateral and inferior ST elevation, which persisted when he arrived in the ED.  At this time, he continues to complain of 6/10 left-sided chest pain that comes in waves.  He also felt short of breath this morning just transferring to the stretcher.  He denies a history of heart problems.  He has not had any palpitations or edema.  He was seen once by Dr. Gollan in 2019 for preoperative cardiovascular risk assessment.  Echocardiogram performed later in 2019 was notable for an LVEF of 40-45% in the setting of hospitalization for multilobar pneumonia.   Past Medical History:  Diagnosis Date   CHF (congestive heart failure), NYHA class I, acute on chronic, systolic    Concussion 2017   cause of seizure after hit head at work   Difficult intubation    patient unaware of this diagnosis   ED (erectile dysfunction)    Hypothyroidism    Seizures 05-20-15   X 1-PT HIT HIS HEAD AT WORK AND THEN HAD A SEIZURE    Past Surgical History:  Procedure  Laterality Date   COLONOSCOPY WITH PROPOFOL N/A 12/07/2018   Procedure: COLONOSCOPY WITH PROPOFOL;  Surgeon: Anna, Kiran, MD;  Location: [facility] ENDOSCOPY;  Service: Gastroenterology;  Laterality: N/A;   FLEXIBLE BRONCHOSCOPY N/A 03/27/2018   Procedure: FLEXIBLE BRONCHOSCOPY With [employer] With C-arm;  Surgeon: Simonds, David B, MD;  Location: [facility] ORS;  Service: Pulmonary;  Laterality: N/A;   HERNIA REPAIR     as a child,umbilical   SHOULDER ARTHROSCOPY WITH ROTATOR CUFF REPAIR AND SUBACROMIAL DECOMPRESSION Left 09/09/2015   Procedure: SHOULDER ARTHROSCOPY, SUBACROMIAL DECOMPRESSION,BICEPS TENSON RELEASE AND MINI OPEN ROTATOR CUFF REPAIR;  Surgeon: Harold Kernodle, MD;  Location: [facility] ORS;  Service: Orthopedics;  Laterality: Left;   VENTRAL HERNIA REPAIR N/A 10/27/2017   Procedure: HERNIA REPAIR VENTRAL ADULT;  Surgeon: Pabon, Diego F, MD;  Location: [facility] ORS;  Service: General;  Laterality: N/A;     Home Medications:  Prior to Admission medications   Medication Sig Start Date Stephanye Finnicum Date Taking? Authorizing Provider  atorvastatin (LIPITOR) 20 MG tablet Take 1 tablet (20 mg total) by mouth at bedtime. 02/07/22   Frazier, Shane J, DO  diclofenac Sodium (VOLTAREN) 1 % GEL Apply 2 g topically 3 (three) times daily as needed (left knee pain arthritis). Patient not taking: Reported on 03/15/2021 10/08/19   Frazier, Shane J, DO  diltiazem (TIAZAC) 420 MG 24 hr capsule Take 1 capsule (420 mg   total) by mouth daily. 08/27/21   Frazier, Shane J, DO  levothyroxine (SYNTHROID) 175 MCG tablet Take 1 tablet (175 mcg total) by mouth daily before breakfast. 02/07/22   Frazier, Shane J, DO  lisinopril (ZESTRIL) 20 MG tablet Take 1 tablet (20 mg total) by mouth every morning. 02/07/22   Frazier, Shane J, DO  metFORMIN (GLUCOPHAGE) 500 MG tablet TAKE 1 TABLET (500 MG TOTAL) BY MOUTH 2 (TWO) TIMES DAILY WITH A MEAL. 10/12/20   Frazier, Shane J, DO  OZEMPIC, 1 MG/DOSE, 4 MG/3ML  SOPN Inject 1 mg into the skin once a week. 02/07/22   Frazier, Shane J, DO  tadalafil (CIALIS) 20 MG tablet 1 tab po 1 hour prior to intercourse 08/10/20   Stoioff, Scott C, MD    Inpatient Medications: Scheduled Meds:  Continuous Infusions:  sodium chloride 20 mL/hr at 08/09/22 1002   PRN Meds:   Allergies:    Allergies  Allergen Reactions   Other Other (See Comments)    BRAZILIAN NUTS ONLY-"THROAT CLOSES UP"    Social History:   Social History   Tobacco Use   Smoking status: Never   Smokeless tobacco: Current    Types: Chew  Vaping Use   Vaping Use: Never used  Substance Use Topics   Alcohol use: Not Currently    Alcohol/week: 8.0 standard drinks of alcohol    Types: 8 Shots of liquor per week    Comment: occasionally. rarely   Drug use: No     Family History:   Family History  Problem Relation Age of Onset   Hypertension Mother    Cancer Father    Diabetes Sister    Thyroid cancer Sister      ROS:  Review of Systems  Unable to perform ROS: Acuity of condition     Physical Exam/Data:   Vitals:   08/09/22 0952 08/09/22 1004  BP:  (!) 168/111  Pulse:  94  Resp:  18  SpO2:  92%  Weight: 111.1 kg   Height: 5' 8" (1.727 m)    No intake or output data in the 24 hours ending 08/09/22 1008    08/09/2022    9:52 AM 02/07/2022    2:54 PM 09/06/2021    8:06 AM  Last 3 Weights  Weight (lbs) 245 lb 245 lb 242 lb 3.2 oz  Weight (kg) 111.131 kg 111.131 kg 109.861 kg     Body mass index is 37.25 kg/m.  General: Uncomfortable appearing man, lying on stretcher in the ED. HEENT: normal Neck: no JVD Vascular: No carotid bruits; Distal pulses 2+ bilaterally Cardiac:  normal S1, S2; RRR; no murmurs, rubs, or gallops. Lungs:  clear to auscultation bilaterally, no wheezing, rhonchi or rales  Abd: soft, nontender, no hepatomegaly  Ext: no edema Musculoskeletal:  No deformities, BUE and BLE strength normal and equal Skin: warm and dry  Neuro:  CNs  2-12 intact, no focal abnormalities noted Psych:  Normal affect   EKG:  The EKG was personally reviewed and demonstrates: Normal sinus rhythm with left atrial enlargement, left axis deviation, borderline LVH, and anterolateral and inferior ST elevation. Telemetry:  Telemetry was personally reviewed and demonstrates: Normal sinus rhythm and sinus tachycardia.  Relevant CV Studies: TTE (03/26/2018): - Left ventricle: The cavity size was mildly dilated. Wall    thickness was normal. Systolic function was mildly to moderately    reduced. The estimated ejection fraction was in the range of 40%    to 45%. Diffuse hypokinesis. The   study is not technically    sufficient to allow evaluation of LV diastolic function.  - Mitral valve: There was mild regurgitation.  - Left atrium: The atrium was moderately dilated.  - Pulmonary arteries: Systolic pressure could not be accurately    estimated.  - Pericardium, extracardiac: A trivial pericardial effusion was    identified.   Laboratory Data:  None available.  Radiology/Studies:  No results found.   Assessment and Plan:   STEMI: Patient presents with sudden onset of typical chest pain this morning.  EKG however shows ST elevation in anterolateral and inferior leads.  Quality of his pain is less consistent with pericarditis.  We have discussed further workup and have agreed to proceed with emergent cardiac catheterization and possible PCI.  Mr. Radovich has provided emergent verbal consent.  Further recommendations to follow catheterization.  HFrEF: Echo in 2019 showed moderately reduced LVEF.  No further workup done since that time, at least not in our records.  It appears that the patient is also chronically on diltiazem.  Repeat echocardiogram this admission with plans to transition to more appropriate GDMT.   For questions or updates, please contact [facility] Please consult [email or web address] for contact info under [facility]  Cardiology.  Signed, Berenice Oehlert, MD  08/09/2022 10:08 AM  

## 2022-08-09 NOTE — H&P (View-Only) (Signed)
Cardiology Consultation   Patient ID: Shane Frazier. MRN: [id number]; DOB: 02/22/1966  Admit date: 08/09/2022 Date of Consult: 08/09/2022  PCP:  Smitty Cords, DO   [facility] Providers Cardiologist:  New - Kyrie Fludd     Patient Profile:   Shane Frazier. is a 57 y.o. male with a hx of HFrEF by echo in 2019, hypertension, hyperlipidemia, and type 2 diabetes mellitus who is being seen 08/09/2022 for the evaluation of chest pain and abnormal EKG at the request of Dr. Cyril Loosen.  History of Present Illness:   Shane Frazier was in his usual state of health until ~3 hours ago.  He suddenly developed left-sided chest pain that he described initially as a pressure-like sensation but subsequently stabbing with radiation to the left arm and left neck.  He was about to get in his truck to go to work.  The pain persisted, prompting him to call 911.  When EMS arrived, he received aspirin and nitroglycerin without resolution of chest pain.  EKG showed anterolateral and inferior ST elevation, which persisted when he arrived in the ED.  At this time, he continues to complain of 6/10 left-sided chest pain that comes in waves.  He also felt short of breath this morning just transferring to the stretcher.  He denies a history of heart problems.  He has not had any palpitations or edema.  He was seen once by Dr. Mariah Milling in 2019 for preoperative cardiovascular risk assessment.  Echocardiogram performed later in 2019 was notable for an LVEF of 40-45% in the setting of hospitalization for multilobar pneumonia.   Past Medical History:  Diagnosis Date   CHF (congestive heart failure), NYHA class I, acute on chronic, systolic    Concussion 2017   cause of seizure after hit head at work   Difficult intubation    patient unaware of this diagnosis   ED (erectile dysfunction)    Hypothyroidism    Seizures 05-20-15   X 1-PT HIT HIS HEAD AT WORK AND THEN HAD A SEIZURE    Past Surgical History:  Procedure  Laterality Date   COLONOSCOPY WITH PROPOFOL N/A 12/07/2018   Procedure: COLONOSCOPY WITH PROPOFOL;  Surgeon: Wyline Mood, MD;  Location: [facility] ENDOSCOPY;  Service: Gastroenterology;  Laterality: N/A;   FLEXIBLE BRONCHOSCOPY N/A 03/27/2018   Procedure: FLEXIBLE BRONCHOSCOPY With [employer] With C-arm;  Surgeon: Merwyn Katos, MD;  Location: [facility] ORS;  Service: Pulmonary;  Laterality: N/A;   HERNIA REPAIR     as a child,umbilical   SHOULDER ARTHROSCOPY WITH ROTATOR CUFF REPAIR AND SUBACROMIAL DECOMPRESSION Left 09/09/2015   Procedure: SHOULDER ARTHROSCOPY, SUBACROMIAL DECOMPRESSION,BICEPS TENSON RELEASE AND MINI OPEN ROTATOR CUFF REPAIR;  Surgeon: Erin Sons, MD;  Location: [facility] ORS;  Service: Orthopedics;  Laterality: Left;   VENTRAL HERNIA REPAIR N/A 10/27/2017   Procedure: HERNIA REPAIR VENTRAL ADULT;  Surgeon: Leafy Ro, MD;  Location: [facility] ORS;  Service: General;  Laterality: N/A;     Home Medications:  Prior to Admission medications   Medication Sig Start Date Caralina Nop Date Taking? Authorizing Provider  atorvastatin (LIPITOR) 20 MG tablet Take 1 tablet (20 mg total) by mouth at bedtime. 02/07/22   Karamalegos, Netta Neat, DO  diclofenac Sodium (VOLTAREN) 1 % GEL Apply 2 g topically 3 (three) times daily as needed (left knee pain arthritis). Patient not taking: Reported on 03/15/2021 10/08/19   Smitty Cords, DO  diltiazem [facility]) 420 MG 24 hr capsule Take 1 capsule (420 mg  total) by mouth daily. 08/27/21   Karamalegos, Netta Neat, DO  levothyroxine (SYNTHROID) 175 MCG tablet Take 1 tablet (175 mcg total) by mouth daily before breakfast. 02/07/22   Karamalegos, Netta Neat, DO  lisinopril (ZESTRIL) 20 MG tablet Take 1 tablet (20 mg total) by mouth every morning. 02/07/22   Karamalegos, Netta Neat, DO  metFORMIN (GLUCOPHAGE) 500 MG tablet TAKE 1 TABLET (500 MG TOTAL) BY MOUTH 2 (TWO) TIMES DAILY WITH A MEAL. 10/12/20   Karamalegos, Alexander J, DO  OZEMPIC, 1 MG/DOSE, 4 MG/3ML  SOPN Inject 1 mg into the skin once a week. 02/07/22   Karamalegos, Netta Neat, DO  tadalafil (CIALIS) 20 MG tablet 1 tab po 1 hour prior to intercourse 08/10/20   Riki Altes, MD    Inpatient Medications: Scheduled Meds:  Continuous Infusions:  sodium chloride 20 mL/hr at 08/09/22 1002   PRN Meds:   Allergies:    Allergies  Allergen Reactions   Other Other (See Comments)    BRAZILIAN NUTS ONLY-"THROAT CLOSES UP"    Social History:   Social History   Tobacco Use   Smoking status: Never   Smokeless tobacco: Current    Types: Chew  Vaping Use   Vaping Use: Never used  Substance Use Topics   Alcohol use: Not Currently    Alcohol/week: 8.0 standard drinks of alcohol    Types: 8 Shots of liquor per week    Comment: occasionally. rarely   Drug use: No     Family History:   Family History  Problem Relation Age of Onset   Hypertension Mother    Cancer Father    Diabetes Sister    Thyroid cancer Sister      ROS:  Review of Systems  Unable to perform ROS: Acuity of condition     Physical Exam/Data:   Vitals:   08/09/22 0952 08/09/22 1004  BP:  (!) 168/111  Pulse:  94  Resp:  18  SpO2:  92%  Weight: 111.1 kg   Height:  (1.727 m)    No intake or output data in the 24 hours ending 08/09/22 1008    08/09/2022    9:52 AM 02/07/2022    2:54 PM 09/06/2021    8:06 AM  Last 3 Weights  Weight (lbs) 245 lb 245 lb 242 lb 3.2 oz  Weight (kg) 111.131 kg 111.131 kg 109.861 kg     Body mass index is 37.25 kg/m.  General: Uncomfortable appearing man, lying on stretcher in the ED. HEENT: normal Neck: no JVD Vascular: No carotid bruits; Distal pulses 2+ bilaterally Cardiac:  normal S1, S2; RRR; no murmurs, rubs, or gallops. Lungs:  clear to auscultation bilaterally, no wheezing, rhonchi or rales  Abd: soft, nontender, no hepatomegaly  Ext: no edema Musculoskeletal:  No deformities, BUE and BLE strength normal and equal Skin: warm and dry  Neuro:  CNs  2-12 intact, no focal abnormalities noted Psych:  Normal affect   EKG:  The EKG was personally reviewed and demonstrates: Normal sinus rhythm with left atrial enlargement, left axis deviation, borderline LVH, and anterolateral and inferior ST elevation. Telemetry:  Telemetry was personally reviewed and demonstrates: Normal sinus rhythm and sinus tachycardia.  Relevant CV Studies: TTE (03/26/2018): - Left ventricle: The cavity size was mildly dilated. Wall    thickness was normal. Systolic function was mildly to moderately    reduced. The estimated ejection fraction was in the range of 40%    to 45%. Diffuse hypokinesis. The  study is not technically    sufficient to allow evaluation of LV diastolic function.  - Mitral valve: There was mild regurgitation.  - Left atrium: The atrium was moderately dilated.  - Pulmonary arteries: Systolic pressure could not be accurately    estimated.  - Pericardium, extracardiac: A trivial pericardial effusion was    identified.   Laboratory Data:  None available.  Radiology/Studies:  No results found.   Assessment and Plan:   STEMI: Patient presents with sudden onset of typical chest pain this morning.  EKG however shows ST elevation in anterolateral and inferior leads.  Quality of his pain is less consistent with pericarditis.  We have discussed further workup and have agreed to proceed with emergent cardiac catheterization and possible PCI.  Shane Frazier has provided emergent verbal consent.  Further recommendations to follow catheterization.  HFrEF: Echo in 2019 showed moderately reduced LVEF.  No further workup done since that time, at least not in our records.  It appears that the patient is also chronically on diltiazem.  Repeat echocardiogram this admission with plans to transition to more appropriate GDMT.   For questions or updates, please contact [facility] Please consult [email or web address] for contact info under [facility]  Cardiology.  Signed, Yvonne Kendall, MD  08/09/2022 10:08 AM

## 2022-08-09 NOTE — ED Notes (Signed)
Pt transport to cath lab at this time

## 2022-08-09 NOTE — Progress Notes (Signed)
Patient was received from cath lab around 1130 in NAD. Alert and oriented, jovial and in good spirits, admits to 3/10 chest discomfort. In NSR. Cangrelor infusing per order. 12 lead EKG completed and sent to EMR, and hard copy place in chart. Right radial TR clean dry intact, with small firm hematoma. Area marked and manual pressure held. Within minutes hematoma increased in size; area marked and Dr. Okey Dupre notified, and in to bedside to see patient. Second TR band placed by Dr. Okey Dupre. Air increased to 12 by Dr. Okey Dupre, and second TR band inflated to 16 per Dr. Okey Dupre. Area is now soft, with no apparent bleeding or increased hematoma. Hand is slightly cool good color and good cap refill, equal to left hand. Moderate pulse palpable. Instructed to keep right arm elevated and patient verbalized understand and is cooperative. Patient ate lunch, family (sisters) and friend have visited bedside. Voiding in urinal with minimal assist.  Of note patient admits to drinking a fifth of liquor daily, and last drank this am just prior to calling EMS and coming to hospital.

## 2022-08-09 NOTE — Interval H&P Note (Signed)
History and Physical Interval Note:  08/09/2022 10:18 AM  Shane Frazier.  has presented today for surgery, with the diagnosis of STEMI.  The various methods of treatment have been discussed with the patient and family. After consideration of risks, benefits and other options for treatment, the patient has consented to  Procedure(s): Coronary/Graft Acute MI Revascularization (N/A) LEFT HEART CATH AND CORONARY ANGIOGRAPHY (N/A) as a surgical intervention.  The patient's history has been reviewed, patient examined, no change in status, stable for surgery.  I have reviewed the patient's chart and labs.  Questions were answered to the patient's satisfaction.    Cath Lab Visit (complete for each Cath Lab visit)  Clinical Evaluation Leading to the Procedure:   ACS: Yes.    Non-ACS:  N/A  [name]

## 2022-08-09 NOTE — ED Provider Notes (Signed)
[facility] Provider Note    Event Date/Time   First MD Initiated Contact with Patient 08/09/22 [phone]     (approximate)   History   Chest Pain   HPI  Shane Stick. is a 57 y.o. male with a history of diabetes, hypertension, tobacco use presents with complaints of chest pain x 2 and half hours.  He reports he was about to mow his lawn when he developed chest pain which radiates to his left arm.  No history of heart disease in the past.  Was feeling short of breath.     Physical Exam   Triage Vital Signs: ED Triage Vitals [08/09/22 0952]  Enc Vitals Group     BP      Pulse      Resp      Temp      Temp src      SpO2      Weight 111.1 kg (245 lb)     Height 1.727 m ( )     Head Circumference      Peak Flow      Pain Score 6     Pain Loc      Pain Edu?      Excl. in GC?     Most recent vital signs: Vitals:   08/09/22 1004  BP: (!) 168/111  Pulse: 94  Resp: 18  SpO2: 92%     General: Awake, no distress.  CV:  Good peripheral perfusion.  Resp:  Normal effort.  Abd:  No distention.  Other:    ED Results / Procedures / Treatments   Labs (all labs ordered are listed, but only abnormal results are displayed) Labs Reviewed  HEMOGLOBIN A1C  CBC WITH DIFFERENTIAL/PLATELET  PROTIME-INR  APTT  COMPREHENSIVE METABOLIC PANEL  LIPID PANEL  TROPONIN I (HIGH SENSITIVITY)     EKG  ED ECG REPORT I, Jene Every, the attending physician, personally viewed and interpreted this ECG.  Date: 08/09/2022  Rhythm: normal sinus rhythm QRS Axis: normal Intervals: normal ST/T Wave abnormalities: Elevations V3 through V5 Narrative Interpretation: STEMI  STEMI activated at 9:57 AM  RADIOLOGY Chest x-ray pending    PROCEDURES:  Critical Care performed: yes  CRITICAL CARE Performed by: Jene Every   Total critical care time: 20 minutes  Critical care time was exclusive of separately billable procedures and treating other  patients.  Critical care was necessary to treat or prevent imminent or life-threatening deterioration.  Critical care was time spent personally by me on the following activities: development of treatment plan with patient and/or surrogate as well as nursing, discussions with consultants, evaluation of patient's response to treatment, examination of patient, obtaining history from patient or surrogate, ordering and performing treatments and interventions, ordering and review of laboratory studies, ordering and review of radiographic studies, pulse oximetry and re-evaluation of patient's condition.   Procedures   MEDICATIONS ORDERED IN ED: Medications  0.9 %  sodium chloride infusion ( Intravenous New Bag/Given 08/09/22 1002)  heparin sodium (porcine) injection 4,000 Units (4,000 Units Intravenous Given 08/09/22 1003)     IMPRESSION / MDM / ASSESSMENT AND PLAN / ED COURSE  I reviewed the triage vital signs and the nursing notes. Patient's presentation is most consistent with acute presentation with potential threat to life or bodily function.  Patient presents with chest pain x 2-1/2 hours.  [facility] had transmitted me an EKG approximately 10 minutes prior to arrival and I instructed them to activate  code STEMI however it appears that did not happen  Upon arrival, EKG repeated, discussed with Dr. Okey Dupre of Cardiology STEMI activated at 9:57 AM  4000 units of heparin ordered ----------------------------------------- 10:03 AM on 08/09/2022 -----------------------------------------  Dr. Okey Dupre is here with patient, he will take to the catheterization lab        FINAL CLINICAL IMPRESSION(S) / ED DIAGNOSES   Final diagnoses:  ST elevation myocardial infarction (STEMI), unspecified artery     Rx / DC Orders   ED Discharge Orders     None        Note:  This document was prepared using Dragon voice recognition software and may include unintentional dictation errors.   Jene Every, MD 08/09/22 1007

## 2022-08-09 NOTE — H&P (Signed)
History and Physical    Shane Frazier. YQI:[id number] DOB: 12/01/1965 DOA: 08/09/2022  Referring MD/NP/PA:   PCP: Smitty Cords, DO   Patient coming from:  The patient is coming from home.     Chief Complaint: Chest pain  HPI: [name]. is a 57 y.o. male with medical history significant of hypertension, hyperlipidemia, sCHF with EF 40-45 (2d echo 05/27/17), hypothyroidism, depression, OSA on CPAP, obesity with BMI 37.25, brain trauma related seizure 2017 (not on seizure medication), migraine headache, alcohol use, who presents with chest pain.  Patient states that his chest pain started about 7:30, which is located on the left side of chest, pressure-like, severe, radiating to left forearm, not aggravated or alleviated by any known factors, associated with sexual breath.  Patient has cough with a little mucus production.  No fever or chills.  Denies nausea, vomiting, diarrhea or abdominal pain.  No symptoms of UTI.  Patient states that he drinks liquor, 5 times per week, at least one pint each time.  EKG showed anterolateral and inferior ST elevation.  Dr. Rolly Salter for cardiologist consulted, who did urgent cardiac cath with stent placement.  Cardiac cath by Dr. Okey Dupre: Conclusions: Severe single-vessel coronary artery disease with thrombotic occlusion of the distal LAD.  There is 50% stenosis at the ostium of the RCA, which does not appear obstructive. Severely reduced left ventricular systolic function (LVEF 25-35%) with severely elevated filling pressure (LVEDP 40 mmHg).  Degree of LVEF is out of proportion to CAD, suggestive of predominantly nonischemic cardiomyopathy. Successful PCI to distal LAD occlusion using Onyx Frontier 2.25 x 18 mm drug-eluting stent with 0% residual stenosis and TIMI-3 flow.   Recommendations: Dual antiplatelet therapy with aspirin and ticagrelor for at least 12 months. Obtain echocardiogram to reassess LVEF and exclude LV thrombus in the setting of  distal LAD occlusion and severely reduced LVEF. Continue diuresis and judicious escalation of goal-directed medical therapy as tolerated. Discontinue cangrelor infusion 2 hours after ticagrelor load was given (infusion to end at 1 PM).  Data reviewed independently and ED Course: pt was found to have tgrop  47 --> 2607, WBCs 3.8, GFR> 60, BNP 77, temperature normal, blood pressure 168/111, heart rate 94, RR 18, oxygen sats are 96% on room air.  Chest x-ray negative.  Patient is admitted to ICU as inpatient.   EKG: I have personally reviewed.  Sinus rhythm, QTc 472, ST elevation in the anterior leads, inferior leads, LAD, LAD, poor IV progression   Review of Systems:   General: no fevers, chills, no body weight gain, has fatigue HEENT: no blurry vision, hearing changes or sore throat Respiratory: has dyspnea, coughing, no  wheezing CV: has chest pain, no palpitations GI: no nausea, vomiting, abdominal pain, diarrhea, constipation GU: no dysuria, burning on urination, increased urinary frequency, hematuria  Ext: has leg edema Neuro: no unilateral weakness, numbness, or tingling, no vision change or hearing loss Skin: no rash, no skin tear. MSK: No muscle spasm, no deformity, no limitation of range of movement in spin Heme: No easy bruising.  Travel history: No recent long distant travel.   Allergy:  Allergies  Allergen Reactions   Other Other (See Comments)    BRAZILIAN NUTS ONLY-"THROAT CLOSES UP"    Past Medical History:  Diagnosis Date   CHF (congestive heart failure), NYHA class I, acute on chronic, systolic    Concussion 2017   cause of seizure after hit head at work   Difficult intubation  patient unaware of this diagnosis   ED (erectile dysfunction)    Hypothyroidism    Seizures 05-20-15   X 1-PT HIT HIS HEAD AT WORK AND THEN HAD A SEIZURE    Past Surgical History:  Procedure Laterality Date   COLONOSCOPY WITH PROPOFOL N/A 12/07/2018   Procedure: COLONOSCOPY WITH  PROPOFOL;  Surgeon: Wyline Mood, MD;  Location: [facility] ENDOSCOPY;  Service: Gastroenterology;  Laterality: N/A;   FLEXIBLE BRONCHOSCOPY N/A 03/27/2018   Procedure: FLEXIBLE BRONCHOSCOPY With [employer] With C-arm;  Surgeon: Merwyn Katos, MD;  Location: [facility] ORS;  Service: Pulmonary;  Laterality: N/A;   HERNIA REPAIR     as a child,umbilical   SHOULDER ARTHROSCOPY WITH ROTATOR CUFF REPAIR AND SUBACROMIAL DECOMPRESSION Left 09/09/2015   Procedure: SHOULDER ARTHROSCOPY, SUBACROMIAL DECOMPRESSION,BICEPS TENSON RELEASE AND MINI OPEN ROTATOR CUFF REPAIR;  Surgeon: Erin Sons, MD;  Location: [facility] ORS;  Service: Orthopedics;  Laterality: Left;   VENTRAL HERNIA REPAIR N/A 10/27/2017   Procedure: HERNIA REPAIR VENTRAL ADULT;  Surgeon: Leafy Ro, MD;  Location: [facility] ORS;  Service: General;  Laterality: N/A;    Social History:  reports that he has never smoked. His smokeless tobacco use includes chew. He reports that he does not currently use alcohol after a past usage of about 8.0 standard drinks of alcohol per week. He reports that he does not use drugs.  Family History:  Family History  Problem Relation Age of Onset   Hypertension Mother    Cancer Father    Diabetes Sister    Thyroid cancer Sister      Prior to Admission medications   Medication Sig Start Date End Date Taking? Authorizing Provider  atorvastatin (LIPITOR) 20 MG tablet Take 1 tablet (20 mg total) by mouth at bedtime. 02/07/22   Karamalegos, Netta Neat, DO  diclofenac Sodium (VOLTAREN) 1 % GEL Apply 2 g topically 3 (three) times daily as needed (left knee pain arthritis). Patient not taking: Reported on 03/15/2021 10/08/19   Smitty Cords, DO  diltiazem [facility]) 420 MG 24 hr capsule Take 1 capsule (420 mg total) by mouth daily. 08/27/21   Karamalegos, Netta Neat, DO  levothyroxine (SYNTHROID) 175 MCG tablet Take 1 tablet (175 mcg total) by mouth daily before breakfast. 02/07/22   Karamalegos, Netta Neat, DO   lisinopril (ZESTRIL) 20 MG tablet Take 1 tablet (20 mg total) by mouth every morning. 02/07/22   Karamalegos, Netta Neat, DO  metFORMIN (GLUCOPHAGE) 500 MG tablet TAKE 1 TABLET (500 MG TOTAL) BY MOUTH 2 (TWO) TIMES DAILY WITH A MEAL. 10/12/20   Karamalegos, Alexander J, DO  OZEMPIC, 1 MG/DOSE, 4 MG/3ML SOPN Inject 1 mg into the skin once a week. 02/07/22   Karamalegos, Netta Neat, DO  tadalafil (CIALIS) 20 MG tablet 1 tab po 1 hour prior to intercourse 08/10/20   Riki Altes, MD    Physical Exam: Vitals:   08/09/22 1300 08/09/22 1400 08/09/22 1500 08/09/22 1700  BP: (!) 141/102 (!) 156/87 (!) 147/94 (!) 150/97  Pulse: 94 85 94 86  Resp: (!) 30 15 11 17   Temp:      TempSrc:      SpO2: 97% 97% 100% 96%  Weight:      Height:       General: Not in acute distress HEENT:       Eyes: PERRL, EOMI, no scleral icterus.       ENT: No discharge from the ears and nose, no pharynx injection, no tonsillar enlargement.  Neck: No JVD, no bruit, no mass felt. Heme: No neck lymph node enlargement. Cardiac: S1/S2, RRR, No murmurs, No gallops or rubs. Respiratory: No rales, wheezing, rhonchi or rubs. GI: Soft, nondistended, nontender, no rebound pain, no organomegaly, BS present. GU: No hematuria Ext: has trace leg edema bilaterally. 1+DP/PT pulse bilaterally. Musculoskeletal: No joint deformities, No joint redness or warmth, no limitation of ROM in spin. Skin: No rashes.  Neuro: Alert, oriented X3, cranial nerves II-XII grossly intact, moves all extremities normally.  Psych: Patient is not psychotic, no suicidal or hemocidal ideation.  Labs on Admission: I have personally reviewed following labs and imaging studies  CBC: Recent Labs  Lab 08/09/22 0956  WBC 3.8*  NEUTROABS 2.3  HGB 15.4  HCT 47.0  MCV 90.4  PLT 227   Basic Metabolic Panel: Recent Labs  Lab 08/09/22 0956  NA 139  K 3.6  CL 103  CO2 27  GLUCOSE 222*  BUN 17  CREATININE 1.25*  CALCIUM 9.7    GFR: Estimated Creatinine Clearance: 80 mL/min (A) (by C-G formula based on SCr of 1.25 mg/dL (H)). Liver Function Tests: Recent Labs  Lab 08/09/22 0956  AST 22  ALT 19  ALKPHOS 62  BILITOT 0.5  PROT 8.0  ALBUMIN 4.1   No results for input(s): "LIPASE", "AMYLASE" in the last 168 hours. No results for input(s): "AMMONIA" in the last 168 hours. Coagulation Profile: Recent Labs  Lab 08/09/22 0956  INR 1.0   Cardiac Enzymes: No results for input(s): "CKTOTAL", "CKMB", "CKMBINDEX", "TROPONINI" in the last 168 hours. BNP (last 3 results) No results for input(s): "PROBNP" in the last 8760 hours. HbA1C: No results for input(s): "HGBA1C" in the last 72 hours. CBG: Recent Labs  Lab 08/09/22 1131  GLUCAP 170*   Lipid Profile: Recent Labs    08/09/22 0956  CHOL 268*  HDL 66  LDLCALC 181*  TRIG 103  CHOLHDL 4.1   Thyroid Function Tests: No results for input(s): "TSH", "T4TOTAL", "FREET4", "T3FREE", "THYROIDAB" in the last 72 hours. Anemia Panel: No results for input(s): "VITAMINB12", "FOLATE", "FERRITIN", "TIBC", "IRON", "RETICCTPCT" in the last 72 hours. Urine analysis:    Component Value Date/Time   COLORURINE STRAW (A) 10/01/2017 1214   APPEARANCEUR CLEAR (A) 10/01/2017 1214   LABSPEC 1.013 10/01/2017 1214   PHURINE 7.0 10/01/2017 1214   GLUCOSEU 150 (A) 10/01/2017 1214   HGBUR NEGATIVE 10/01/2017 1214   BILIRUBINUR negative 09/11/2019 1013   KETONESUR NEGATIVE 10/01/2017 1214   PROTEINUR Negative 09/11/2019 1013   PROTEINUR 30 (A) 10/01/2017 1214   UROBILINOGEN 0.2 09/11/2019 1013   NITRITE negative 09/11/2019 1013   NITRITE NEGATIVE 10/01/2017 1214   LEUKOCYTESUR Negative 09/11/2019 1013   Sepsis Labs: @LABRCNTIP (procalcitonin:4,lacticidven:4) ) Recent Results (from the past 240 hour(s))  MRSA Next Gen by PCR, Nasal     Status: None   Collection Time: 08/09/22 11:35 AM   Specimen: Nasal Mucosa; Nasal Swab  Result Value Ref Range Status   MRSA by  PCR Next Gen NOT DETECTED NOT DETECTED Final    Comment: (NOTE) The GeneXpert MRSA Assay (FDA approved for NASAL specimens only), is one component of a comprehensive MRSA colonization surveillance program. It is not intended to diagnose MRSA infection nor to guide or monitor treatment for MRSA infections. Test performance is not FDA approved in patients less than 30 years old. Performed at [facility], [address] [place], [place] [address]      Radiological Exams on Admission: [facility] Chest [place] 1  View  Result Date: 08/09/2022 CLINICAL DATA:  Chest pain EXAM: PORTABLE CHEST 1 VIEW COMPARISON:  04/19/2018 FINDINGS: Normal cardiac and mediastinal contours given AP technique. No focal pulmonary opacity. No pleural effusion or pneumothorax. No acute osseous abnormality. IMPRESSION: No acute cardiopulmonary process. Electronically Signed   By: Wiliam Ke M.D.   On: 08/09/2022 13:54   CARDIAC CATHETERIZATION  Result Date: 08/09/2022 Conclusions: Severe single-vessel coronary artery disease with thrombotic occlusion of the distal LAD.  There is 50% stenosis at the ostium of the RCA, which does not appear obstructive. Severely reduced left ventricular systolic function (LVEF 25-35%) with severely elevated filling pressure (LVEDP 40 mmHg).  Degree of LVEF is out of proportion to CAD, suggestive of predominantly nonischemic cardiomyopathy. Successful PCI to distal LAD occlusion using Onyx Frontier 2.25 x 18 mm drug-eluting stent with 0% residual stenosis and TIMI-3 flow. Recommendations: Dual antiplatelet therapy with aspirin and ticagrelor for at least 12 months. Obtain echocardiogram to reassess LVEF and exclude LV thrombus in the setting of distal LAD occlusion and severely reduced LVEF. Continue diuresis and judicious escalation of goal-directed medical therapy as tolerated. Discontinue cangrelor infusion 2 hours after ticagrelor load was given (infusion to end at 1 PM). Yvonne Kendall,  MD [facility]     Assessment/Plan Active Problems:   ST elevation myocardial infarction involving left anterior descending (LAD) coronary artery   Chronic combined systolic and diastolic heart failure due to valvular disease   Essential hypertension   Type 2 diabetes mellitus with diabetic retinopathy   HLD (hyperlipidemia)   Alcohol use   OSA on CPAP   Obesity (BMI 30-39.9)   Assessment and Plan:  ST elevation myocardial infarction involving left anterior descending (LAD) coronary artery: trop  48 --> 2607. S/p of urgent cardiac catheter with stent placement by Dr End.  Chest pain has resolved.  - admit to ICU as inpatient - prn Nitroglycerin, Morphine - Aspirin, brilinta - lipitor - coreg - Risk factor stratification: will check FLP and A1C  - check UDS - 2d echo  Chronic combined systolic and diastolic heart failure due to valvular disease: 2D echo on 05/27/2017 showed EF of 40 to 45%.  Cardiac catheter showed EF of 25 to 35%.  BNP 177. Has severely elevated filling pressure (LVEDP 40 mmHg).  Patient does not have oxygen desaturation.  Does not have acute CHF exacerbation. -Lasix 40 mg twice daily is ordered by Dr. Okey Dupre  Essential hypertension -IV hydralazine as needed -Patient is on IV Lasix -Coreg, Cozaar  Type 2 diabetes mellitus with diabetic retinopathy: Recent A1c 9.1, poorly controlled.  Patient is taking metformin and Ozempic -Sliding scale insulin  HLD (hyperlipidemia) -Lipitor  Alcohol use -Daily counseling about importance of quitting alcohol -CIWA protocol  OSA  -on CPAP   Obesity (BMI 30-39.9): Body weight well 9.1 kg, BMI 36.57 -Encourage losing weight -Exercise and healthy diet     DVT ppx: SQ Lovenox  Code Status: Full code  Family Communication:   Yes, patient's daughter   at bed side.     Disposition Plan:  Anticipate discharge back to previous environment  Consults called:  Dr. Okey Dupre of card  Admission status and Level of care:  ICU:   as inpt     Dispo: The patient is from: Home              Anticipated d/c is to: Home              Anticipated d/c date is: 2  days              Patient currently is not medically stable to d/c.    Severity of Illness:  The appropriate patient status for this patient is INPATIENT. Inpatient status is judged to be reasonable and necessary in order to provide the required intensity of service to ensure the patient's safety. The patient's presenting symptoms, physical exam findings, and initial radiographic and laboratory data in the context of their chronic comorbidities is felt to place them at high risk for further clinical deterioration. Furthermore, it is not anticipated that the patient will be medically stable for discharge from the hospital within 2 midnights of admission.   * I certify that at the point of admission it is my clinical judgment that the patient will require inpatient hospital care spanning beyond 2 midnights from the point of admission due to high intensity of service, high risk for further deterioration and high frequency of surveillance required.*       Date of Service 08/09/2022    Lorretta Harp [facility]   If 7PM-7AM, please contact night-coverage [email or web address] 08/09/2022, 6:21 PM

## 2022-08-09 NOTE — ED Triage Notes (Addendum)
Pt via [facility] from home. Pt c/o L sided chest pain that started 2-3 hours ago. Pt has a hx of HTN and DM. EMS gave 324 ASA and 2 SL Nitro. States the Nitro did not help with his pain. Pt is A&OX4 and NAD

## 2022-08-09 NOTE — Progress Notes (Signed)
[facility]  I was notified that the patient developed a right forearm hematoma shortly after arriving in the ICU.  Manual compression was applied by the nurse and myself without further expansion of the hematoma.  A second TR band was applied proximal to the initial band without further growth of the hematoma.  Forearm assessed multiple times over the course of the afternoon; hematoma did not appear to be expanding.  Right hand/forearm appears neurovascularly intact.  We will continue close monitoring.  Patient encouraged to elevate right arm and minimize right arm use.  Yvonne Kendall, MD [facility] 08/09/22 7:12 PM

## 2022-08-09 NOTE — ED Notes (Signed)
Dr. Okey Dupre, cardiologist at bedside at this time.

## 2022-08-09 NOTE — Progress Notes (Signed)
Chaplain paged for [employer]. I met family and pastor at the family consult room in the ED, introduced myself and escorted them to the waiting room for the cath lab. Patient is getting procedure and will more than likely be admitted. I made family aware of chaplain services and encouraged them to have staff page the chaplain if they need additional support.  We ask on-call chaplain to follow up later with family/patient.

## 2022-08-09 NOTE — TOC Benefit Eligibility Note (Addendum)
Patient Product/process development scientist completed.    The patient is currently admitted and upon discharge could be taking Brilinta 90 mg.  The current 30 day co-pay is $420.86 due to a $4,700 deductible.   The patient is currently admitted and upon discharge could be taking clopidogrel (Plavix) 75 mg.  The current 30 day co-pay is $3.00.   The patient is currently admitted and upon discharge could be taking prasugrel (Effient) 10 mg.  The current 30 day co-pay is $18.88   The patient is insured through [employer]   This test claim was processed through [employer] [facility]- copay amounts may vary at other pharmacies due to pharmacy/plan contracts, or as the patient moves through the different stages of their insurance plan.  Shane Frazier, CPHT Pharmacy Patient Advocate Specialist [facility] Patient Advocate Team Direct Number: [phone]  Fax: [phone]

## 2022-08-10 ENCOUNTER — Other Ambulatory Visit (HOSPITAL_COMMUNITY): Payer: Self-pay

## 2022-08-10 ENCOUNTER — Inpatient Hospital Stay (HOSPITAL_COMMUNITY)
Admit: 2022-08-10 | Discharge: 2022-08-10 | Disposition: A | Discharge status: Home / Self Care | Payer: 59 | Attending: Internal Medicine | Admitting: Internal Medicine

## 2022-08-10 ENCOUNTER — Encounter: Payer: Self-pay | Admitting: Internal Medicine

## 2022-08-10 DIAGNOSIS — I2102 ST elevation (STEMI) myocardial infarction involving left anterior descending coronary artery: Principal | ICD-10-CM

## 2022-08-10 DIAGNOSIS — E1169 Type 2 diabetes mellitus with other specified complication: Secondary | ICD-10-CM

## 2022-08-10 DIAGNOSIS — F1010 Alcohol abuse, uncomplicated: Secondary | ICD-10-CM

## 2022-08-10 LAB — GLUCOSE, CAPILLARY
Glucose-Capillary: 225 mg/dL — ABNORMAL HIGH (ref 70–99)
Glucose-Capillary: 241 mg/dL — ABNORMAL HIGH (ref 70–99)
Glucose-Capillary: 172 mg/dL — ABNORMAL HIGH (ref 70–99)
Glucose-Capillary: 135 mg/dL — ABNORMAL HIGH (ref 70–99)

## 2022-08-10 LAB — CBC
HCT: 46.3 % (ref 39.0–52.0)
Hemoglobin: 15.6 g/dL (ref 13.0–17.0)
MCH: 29.7 pg (ref 26.0–34.0)
MCHC: 33.7 g/dL (ref 30.0–36.0)
MCV: 88.2 fL (ref 80.0–100.0)
Platelets: 233 10*3/uL (ref 150–400)
RBC: 5.25 MIL/uL (ref 4.22–5.81)
RDW: 12.9 % (ref 11.5–15.5)
WBC: 6.7 10*3/uL (ref 4.0–10.5)
nRBC: 0 % (ref 0.0–0.2)

## 2022-08-10 LAB — ECHOCARDIOGRAM COMPLETE
AR max vel: 2.34 cm2
AV Area VTI: 2.44 cm2
AV Area mean vel: 2.39 cm2
AV Mean grad: 3 mmHg
AV Peak grad: 4.4 mmHg
Ao pk vel: 1.04 m/s
Area-P 1/2: 5.16 cm2
Calc EF: 12.9 %
Height: 68 in
S' Lateral: 4.5 cm
Single Plane A2C EF: 3.8 %
Single Plane A4C EF: 24.4 %
Weight: 3848.35 oz

## 2022-08-10 LAB — BASIC METABOLIC PANEL
Anion gap: 12 (ref 5–15)
BUN: 17 mg/dL (ref 6–20)
CO2: 24 mmol/L (ref 22–32)
Calcium: 9.4 mg/dL (ref 8.9–10.3)
Chloride: 99 mmol/L (ref 98–111)
Creatinine, Ser: 1.23 mg/dL (ref 0.61–1.24)
GFR, Estimated: >60 mL/min (ref >60)
Glucose, Bld: 182 mg/dL — ABNORMAL HIGH (ref 70–99)
Potassium: 3.2 mmol/L — ABNORMAL LOW (ref 3.5–5.1)
Sodium: 135 mmol/L (ref 135–145)

## 2022-08-10 LAB — MAGNESIUM: Magnesium: 1.6 mg/dL — ABNORMAL LOW (ref 1.7–2.4)

## 2022-08-10 MED ORDER — SACUBITRIL-VALSARTAN 49-51 MG PO TABS
1.0000 | ORAL_TABLET | Freq: Two times a day (BID) | ORAL | Status: DC
  Administered 2022-08-11 – 2022-08-12 (×3): 1 via ORAL
  Filled 2022-08-10 (×3): qty 1

## 2022-08-10 MED ORDER — INSULIN GLARGINE-YFGN 100 UNIT/ML ~~LOC~~ SOLN
5.0000 [IU] | Freq: Every day | SUBCUTANEOUS | Status: DC
  Administered 2022-08-10 – 2022-08-12 (×3): 5 [IU] via SUBCUTANEOUS
  Filled 2022-08-10 (×3): qty 0.05

## 2022-08-10 MED ORDER — PNEUMOCOCCAL 20-VAL CONJ VACC 0.5 ML IM SUSY
0.5000 mL | PREFILLED_SYRINGE | INTRAMUSCULAR | Status: AC
  Administered 2022-08-11: 0.5 mL via INTRAMUSCULAR
  Filled 2022-08-10: qty 0.5

## 2022-08-10 MED ORDER — SPIRONOLACTONE 12.5 MG HALF TABLET
12.5000 mg | ORAL_TABLET | Freq: Every day | ORAL | Status: DC
  Administered 2022-08-10 – 2022-08-12 (×3): 12.5 mg via ORAL
  Filled 2022-08-10 (×3): qty 1

## 2022-08-10 MED ORDER — MAGNESIUM SULFATE 2 GM/50ML IV SOLN
2.0000 g | Freq: Once | INTRAVENOUS | Status: AC
  Administered 2022-08-10: 2 g via INTRAVENOUS
  Filled 2022-08-10: qty 50

## 2022-08-10 MED ORDER — POTASSIUM CHLORIDE CRYS ER 20 MEQ PO TBCR
40.0000 meq | EXTENDED_RELEASE_TABLET | Freq: Once | ORAL | Status: AC
  Administered 2022-08-10: 40 meq via ORAL
  Filled 2022-08-10: qty 2

## 2022-08-10 NOTE — Progress Notes (Signed)
*  PRELIMINARY RESULTS* Echocardiogram 2D Echocardiogram has been performed.  Cristela Blue 08/10/2022, 10:45 AM

## 2022-08-10 NOTE — Consult Note (Signed)
Advanced Heart Failure Team Consult Note   Primary Physician: Smitty Cords, DO PCP-Cardiologist:  Dr. Okey Dupre  Reason for Consultation: Acute MI, new systolic HF  HPI:    Shane Frazier. is seen today for evaluation of acute systolic HF at the request of Dr. Okey Dupre.   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 He was seen once by Dr. Mariah Milling in 2019 for preoperative cardiovascular risk assessment.  Echo in 2019 was notable for an LVEF of 40-45% in the setting of hospitalization for multilobar pneumonia. He did not f/u.   Developed acute CP yesterday am. EMS activated and brought to ER.  EKG showed anterolateral and inferior ST elevation.  Taken emergently to cath lab. Cath showed thrombotic occlusion of distal LAD/ There was a 50% ostial RCA lesion and Lcx was ok. EF 25-35% LVEDP 40  Underwent PCI/DES of distal LAD with 2.25x18 DES. EF felt to be down out of proportion to CAD. Developed R forearm hematoma post cath that resolved with compression.   This am sitting in chair. Feels good. No CP or SOB. BP 150/100 (says BP is always high). Drinks 1/5 of ETOH most days of the week. Dips snuff. Not using CPAP.   Echo today reviewed personally EF 20-25% with high sphericity index. RV ok .   Review of Systems: [y] = yes,  = no   General: Weight gain ; Weight loss ; Anorexia ; Fatigue [ y]; Fever ; Chills ; Weakness   Cardiac: Chest pain/pressure [ y]; Resting SOB [ y]; Exertional SOB ; Orthopnea ; Pedal Edema ; Palpitations ; Syncope ; Presyncope ; Paroxysmal nocturnal dyspnea[ ]   Pulmonary: Cough ; Wheezing[ ] ; Hemoptysis[ ] ; Sputum ; Snoring [place] ]  GI: Vomiting[ ] ; Dysphagia[ ] ; Melena[ ] ; Hematochezia ; Heartburn[ ] ; Abdominal pain ; Constipation ; Diarrhea ; BRBPR   GU: Hematuria[ ] ; Dysuria ; Nocturia[ ]   Vascular: Pain in legs with walking ; Pain in  feet with lying flat ; Non-healing sores ; Stroke ; TIA ; Slurred speech ;  Neuro: Headaches[ ] ; Vertigo[ ] ; Seizures[ ] ; Paresthesias[ ] ;Blurred vision ; Diplopia ; Vision changes   Ortho/Skin: Arthritis [ y]; Joint pain [ y]; Muscle pain ; Joint swelling ; Back Pain ; Rash   Psych: Depression[ ] ; Anxiety[ ]   Heme: Bleeding problems ; Clotting disorders ; Anemia   Endocrine: Diabetes [place] ]; Thyroid dysfunction[ ]   Home Medications Prior to Admission medications   Medication Sig Start Date End Date Taking? Authorizing Provider  atorvastatin (LIPITOR) 20 MG tablet Take 1 tablet (20 mg total) by mouth at bedtime. 02/07/22   Karamalegos, Netta Neat, DO  diclofenac Sodium (VOLTAREN) 1 % GEL Apply 2 g topically 3 (three) times daily as needed (left knee pain arthritis). Patient not taking: Reported on 03/15/2021 10/08/19   Smitty Cords, DO  diltiazem [facility]) 420 MG 24 hr capsule Take 1 capsule (420 mg total) by mouth daily. 08/27/21   Karamalegos, Netta Neat, DO  levothyroxine (SYNTHROID) 175 MCG tablet Take 1 tablet (175 mcg total) by mouth daily before breakfast. 02/07/22   Althea Charon, Netta Neat, DO  lisinopril (ZESTRIL) 20 MG tablet Take 1 tablet (20 mg total)  by mouth every morning. 02/07/22   Karamalegos, Netta Neat, DO  metFORMIN (GLUCOPHAGE) 500 MG tablet TAKE 1 TABLET (500 MG TOTAL) BY MOUTH 2 (TWO) TIMES DAILY WITH A MEAL. 10/12/20   Karamalegos, Alexander J, DO  OZEMPIC, 1 MG/DOSE, 4 MG/3ML SOPN Inject 1 mg into the skin once a week. 02/07/22   Karamalegos, Netta Neat, DO  tadalafil (CIALIS) 20 MG tablet 1 tab po 1 hour prior to intercourse 08/10/20   Riki Altes, MD    Past Medical History: Past Medical History:  Diagnosis Date   CHF (congestive heart failure), NYHA class I, acute on chronic, systolic    Concussion 2017   cause of seizure after hit head at work   Difficult intubation    patient unaware of this  diagnosis   ED (erectile dysfunction)    Hypothyroidism    Seizures 05-20-15   X 1-PT HIT HIS HEAD AT WORK AND THEN HAD A SEIZURE    Past Surgical History: Past Surgical History:  Procedure Laterality Date   COLONOSCOPY WITH PROPOFOL N/A 12/07/2018   Procedure: COLONOSCOPY WITH PROPOFOL;  Surgeon: Wyline Mood, MD;  Location: [facility] ENDOSCOPY;  Service: Gastroenterology;  Laterality: N/A;   CORONARY/GRAFT ACUTE MI REVASCULARIZATION N/A 08/09/2022   Procedure: Coronary/Graft Acute MI Revascularization;  Surgeon: Yvonne Kendall, MD;  Location: [facility] INVASIVE CV LAB;  Service: Cardiovascular;  Laterality: N/A;   FLEXIBLE BRONCHOSCOPY N/A 03/27/2018   Procedure: FLEXIBLE BRONCHOSCOPY With [employer] [facility] With C-arm;  Surgeon: Merwyn Katos, MD;  Location: [facility] ORS;  Service: Pulmonary;  Laterality: N/A;   HERNIA REPAIR     as a child,umbilical   LEFT HEART CATH AND CORONARY ANGIOGRAPHY N/A 08/09/2022   Procedure: LEFT HEART CATH AND CORONARY ANGIOGRAPHY;  Surgeon: Yvonne Kendall, MD;  Location: [facility] INVASIVE CV LAB;  Service: Cardiovascular;  Laterality: N/A;   SHOULDER ARTHROSCOPY WITH ROTATOR CUFF REPAIR AND SUBACROMIAL DECOMPRESSION Left 09/09/2015   Procedure: SHOULDER ARTHROSCOPY, SUBACROMIAL DECOMPRESSION,BICEPS TENSON RELEASE AND MINI OPEN ROTATOR CUFF REPAIR;  Surgeon: Erin Sons, MD;  Location: [facility] ORS;  Service: Orthopedics;  Laterality: Left;   VENTRAL HERNIA REPAIR N/A 10/27/2017   Procedure: HERNIA REPAIR VENTRAL ADULT;  Surgeon: Leafy Ro, MD;  Location: [facility] ORS;  Service: General;  Laterality: N/A;    Family History: Family History  Problem Relation Age of Onset   Hypertension Mother    Cancer Father    Diabetes Sister    Thyroid cancer Sister     Social History: Social History   Socioeconomic History   Marital status: Married    Spouse name: Not on file   Number of children: Not on file   Years of education: Not on file   Highest education level: Not on file   Occupational History   Not on file  Tobacco Use   Smoking status: Never   Smokeless tobacco: Current    Types: Chew  Vaping Use   Vaping Use: Never used  Substance and Sexual Activity   Alcohol use: Not Currently    Alcohol/week: 8.0 standard drinks of alcohol    Types: 8 Shots of liquor per week    Comment: occasionally. rarely   Drug use: No   Sexual activity: Yes  Other Topics Concern   Not on file  Social History Narrative   Not on file   Social Determinants of Health   Financial Resource Strain: Not on file  Food Insecurity: Not on file  Transportation Needs: Not on file  Physical Activity:  Not on file  Stress: Not on file  Social Connections: Not on file    Allergies:  Allergies  Allergen Reactions   Other Other (See Comments)    BRAZILIAN NUTS ONLY-"THROAT CLOSES UP"    Objective:    Vital Signs:   Temp:  [98.1 F (36.7 C)-98.5 F (36.9 C)] 98.2 F (36.8 C) (04/24 0910) Pulse Rate:  [72-95] 95 (04/24 0910) Resp:  [11-30] 20 (04/24 0910) BP: (123-159)/(79-110) 143/98 (04/24 0910) SpO2:  [91 %-100 %] 97 % (04/24 0910)    Weight change: Filed Weights   08/09/22 0952 08/09/22 1125  Weight: 111.1 kg 109.1 kg    Intake/Output:   Intake/Output Summary (Last 24 hours) at 08/10/2022 1140 Last data filed at 08/10/2022 0300 Gross per 24 hour  Intake 785.76 ml  Output 2500 ml  Net -1714.24 ml      Physical Exam    General:  Obese male sitting in chair No resp difficulty HEENT: normal Neck: supple. JVP . Carotids 2+ bilat; no bruits. No lymphadenopathy or thyromegaly appreciated. Cor: PMI nondisplaced. Regular rate & rhythm. No rubs, gallops or murmurs. Lungs: clear Abdomen: obese soft, nontender, nondistended. No hepatosplenomegaly. No bruits or masses. Good bowel sounds. Extremities: no cyanosis, clubbing, rash, edema R forearm. Mild bruising. Good pulse. Normal sensation. No bruit.  Neuro: alert & orientedx3, cranial nerves grossly intact.  moves all 4 extremities w/o difficulty. Affect pleasant   Telemetry   Sinus 90-100 Personally reviewed   EKG    Sinus 90 diffuse TWI No Q waves. Personally reviewed   Labs   Basic Metabolic Panel: Recent Labs  Lab 08/09/22 0956 08/10/22 0429  NA 139 135  K 3.6 3.2*  CL 103 99  CO2 27 24  GLUCOSE 222* 182*  BUN 17 17  CREATININE 1.25* 1.23  CALCIUM 9.7 9.4  MG  --  1.6*    Liver Function Tests: Recent Labs  Lab 08/09/22 0956  AST 22  ALT 19  ALKPHOS 62  BILITOT 0.5  PROT 8.0  ALBUMIN 4.1   No results for input(s): "LIPASE", "AMYLASE" in the last 168 hours. No results for input(s): "AMMONIA" in the last 168 hours.  CBC: Recent Labs  Lab 08/09/22 0956 08/10/22 0429  WBC 3.8* 6.7  NEUTROABS 2.3  --   HGB 15.4 15.6  HCT 47.0 46.3  MCV 90.4 88.2  PLT 227 233    Cardiac Enzymes: No results for input(s): "CKTOTAL", "CKMB", "CKMBINDEX", "TROPONINI" in the last 168 hours.  BNP: BNP (last 3 results) Recent Labs    08/09/22 1153  BNP 177.4*    ProBNP (last 3 results) No results for input(s): "PROBNP" in the last 8760 hours.   CBG: Recent Labs  Lab 08/09/22 1131 08/09/22 2124 08/10/22 0758  GLUCAP 170* 183* 225*    Coagulation Studies: Recent Labs    08/09/22 0956  LABPROT 13.4  INR 1.0     Imaging   DG Chest Port 1 View  Result Date: 08/09/2022 CLINICAL DATA:  Chest pain EXAM: PORTABLE CHEST 1 VIEW COMPARISON:  04/19/2018 FINDINGS: Normal cardiac and mediastinal contours given AP technique. No focal pulmonary opacity. No pleural effusion or pneumothorax. No acute osseous abnormality. IMPRESSION: No acute cardiopulmonary process. Electronically Signed   By: Wiliam Ke M.D.   On: 08/09/2022 13:54     Medications:     Current Medications:  aspirin  81 mg Oral Daily   atorvastatin  80 mg Oral QHS   carvedilol  3.125 mg Oral  BID WC   Chlorhexidine Gluconate Cloth  6 each Topical Daily   enoxaparin (LOVENOX) injection  40 mg  Subcutaneous Q24H   folic acid  1 mg Oral Daily   furosemide  40 mg Intravenous BID   insulin aspart  0-20 Units Subcutaneous TID WC   insulin aspart  0-5 Units Subcutaneous QHS   levothyroxine  175 mcg Oral QAC breakfast   LORazepam  0-4 mg Intravenous Q6H   Followed by   Melene Muller ON 08/11/2022] LORazepam  0-4 mg Intravenous Q12H   multivitamin with minerals  1 tablet Oral Daily   potassium chloride  40 mEq Oral Once   [START ON 08/11/2022] sacubitril-valsartan  1 tablet Oral BID   sodium chloride flush  3 mL Intravenous Q12H   spironolactone  12.5 mg Oral Daily   thiamine  100 mg Oral Daily   Or   thiamine  100 mg Intravenous Daily   ticagrelor  90 mg Oral BID    Infusions:  sodium chloride Stopped (08/09/22 1420)   sodium chloride     magnesium sulfate bolus IVPB        Assessment/Plan   1. Acute anterior STEMI - Cath 08/09/22 with thrombotic occlusion of distal LAD/ There was a 50% ostial RCA lesion and Lcx was ok. EF 25-35% LVEDP 40   - s/p PCI/DES of distal LAD with 2.25x18 DES.  - CP free - Continue DAPT/statin - Refer CR  2. Acute on chronic systolic HF - Echo 2019 with EF 40-45% - Echo 08/10/22 reviewed personally EF 20-25% with high sphericity index. RV ok . - Agree that LV dysfunction is out of proportion to acute MI and likely represents longstanding NICM due to HTN or ETOH - Continue IV diuresis one more day - Start Entresto 49/51 - Start spiro 12.5 - Continue carvedilol 3.125 bid - Add SGLT2 tomorrow - Discussed need for ETOH cessation and BP control  - Can consider cMRI to more formally evalaute - CR consult  3. Severe HTN - meds as above - needs to wear CPAP  4. ETOH abuse - discussed need for cessation  - CIWA protocol in place  5. DM2, poorly controlled - per primary - A1c 10.3 (08/09/22) - Add SGLT2i tomorrow  6. Hypokalemia/hypomag - supp  Length of Stay: 1  Arvilla Meres, MD  08/10/2022, 11:40 AM  Advanced Heart Failure  Team Pager [phone] (M-F; 7a - 5p)  Please contact [facility] for night-coverage after hours (4p -7a ) and weekends on [email or web address]

## 2022-08-10 NOTE — Inpatient Diabetes Management (Addendum)
Inpatient Diabetes Program Recommendations  AACE/ADA: New Consensus Statement on Inpatient Glycemic Control   Target Ranges:  Prepandial:   less than 140 mg/dL      Peak postprandial:   less than 180 mg/dL (1-2 hours)      Critically ill patients:  140 - 180 mg/dL    Latest Reference Range & Units 08/09/22 11:31 08/09/22 21:24 08/10/22 07:58  Glucose-Capillary 70 - 99 mg/dL 161 (H) 096 (H) 045 (H)    Latest Reference Range & Units 08/09/22 09:56  Hemoglobin A1C 4.8 - 5.6 % 10.3 (H)   Review of Glycemic Control  Diabetes history: DM2 Outpatient Diabetes medications: Metformin 500 mg BID (not taken in 4 months), Ozempic 1 mg Qweek (not taken in 4 months) Current orders for Inpatient glycemic control: Novolog 0-20 units TID with meals, Novolog 0-5 units QHS  Inpatient Diabetes Program Recommendations:    Insulin: Please consider ordering Semglee 5 units Q24H.  HbgA1C:  A1C 10.3% on 08/09/22 indicating an average glucose of 249 mg/dl over the past 2-3 months.  Outpatient DM medications: Patient has not taken Metformin and Ozempic in at least 4 months. Per TOC, patient's current insurance will end on 08/16/22. Patient will need affordable DM medications at discharge; may want to consider discharging on Metformin 1000 mg BID and Glipizide 5 mg daily.  Addendum 08/10/22@13 :25-Spoke with patient at bedside about diabetes and home regimen for diabetes control. Patient reports he has not seen PCP in about 6 months. Patient states that him and his wife are going through a divorce and she has insurance on him but it is ending soon if not already.  Sarah with [facility] states that patient currently has [employer] but it will end on 08/16/22.  Patient states that he was suppose to take Ozempic and Metformin but they are too expensive and he has not taken in at least 4 months. Patient states that prior to that, he was not taking either one consistently.   Patient reports not checking glucose in  about 2 months but it had been 150-230 mg/dl when last checked. Discussed A1C results (10.3% on 08/09/22 ) and explained that current A1C indicates an average glucose of 249 mg/dl over the past 2-3 months. Discussed glucose and A1C goals. Discussed importance of checking CBGs and maintaining good CBG control to prevent long-term and short-term complications. Explained how hyperglycemia leads to damage within blood vessels which lead to the common complications seen with uncontrolled diabetes. Stressed to the patient the importance of improving glycemic control to prevent further complications from uncontrolled diabetes. Discussed impact of nutrition, exercise, stress, sickness, and medications on diabetes control.  Patient states he has been out of work since November 2023 so it would be difficult for him to pay for medications or follow up. TOC has provided patient with information on [facility] and [facility] for him to use if he in fact loses insurance after 08/16/22. Discussed affordable DM medications at [facility] (such as Metformin and Glipizide which are $4 copay) and patient states he thinks he could afford a $4 copay for DM medications. Stressed to patient to be sure that he gets established with a new PCP and if he loses insurance on 08/16/22 he could get established with clinic (such as [facility], [facility], or [facility]) and possibly get medications from [facility].  Stressed to patient that he needed to consistently take DM medication, check glucose 2-3 times a day, and get established with PCP  for consistent follow up.  Patient verbalized understanding of information discussed and reports no further questions at this time related to diabetes.  Thanks, Orlando Penner, RN, MSN, CDCES Diabetes Coordinator Inpatient Diabetes Program [phone] (Team Pager from 8am to 5pm)

## 2022-08-10 NOTE — TOC Initial Note (Addendum)
Transition of Care (TOC) - Initial/Assessment Note    Patient Details  Name: Shane Frazier. MRN: [id number] Date of Birth: Sep 12, 1965  Transition of Care [facility]) CM/SW Contact:    Margarito Liner, LCSW Phone Number: 08/10/2022, 1:16 PM  Clinical Narrative:  CSW met with patient. No supports at bedside. CSW introduced role and inquired about interest in SA resources. Patient is agreeable. Left in the room with him. Patient may not have insurance after 4/30. Gave packet for free/low-cost healthcare in [facility] and intake paperwork for [employer]. No further concerns. CSW will continue to follow patient for support and facilitate return home once stable. He confirmed someone will be able to pick him up at discharge.               Expected Discharge Plan: Home/Self Care Barriers to Discharge: Continued Medical Work up   Patient Goals and CMS Choice            Expected Discharge Plan and Services     Post Acute Care Choice: NA                                        Prior Living Arrangements/Services     Patient language and need for interpreter reviewed:: Yes Do you feel safe going back to the place where you live?: Yes      Need for Family Participation in Patient Care: Yes (Comment)     Criminal Activity/Legal Involvement Pertinent to Current Situation/Hospitalization: No - Comment as needed  Activities of Daily Living Home Assistive Devices/Equipment: None, Eyeglasses ADL Screening (condition at time of admission) Patient's cognitive ability adequate to safely complete daily activities?: Yes Is the patient deaf or have difficulty hearing?: No Does the patient have difficulty seeing, even when wearing glasses/contacts?: No Does the patient have difficulty concentrating, remembering, or making decisions?: No Patient able to express need for assistance with ADLs?: Yes Does the patient have difficulty dressing or bathing?: No Independently performs  ADLs?: Yes (appropriate for developmental age) Does the patient have difficulty walking or climbing stairs?: No Weakness of Legs: None Weakness of Arms/Hands: None  Permission Sought/Granted                  Emotional Assessment Appearance:: Appears stated age Attitude/Demeanor/Rapport: Engaged Affect (typically observed): Accepting, Calm Orientation: : Oriented to Self, Oriented to Place, Oriented to  Time, Oriented to Situation Alcohol / Substance Use: Alcohol Use Psych Involvement: No (comment)  Admission diagnosis:  ST elevation myocardial infarction (STEMI), unspecified artery [I21.3] STEMI involving left anterior descending coronary artery [I21.02] Patient Active Problem List   Diagnosis Date Noted   ST elevation myocardial infarction involving left anterior descending (LAD) coronary artery 08/09/2022   STEMI involving left anterior descending coronary artery 08/09/2022   HLD (hyperlipidemia) 08/09/2022   Obesity (BMI 30-39.9) 08/09/2022   Chronic combined systolic and diastolic heart failure due to valvular disease 08/09/2022   Alcohol use 08/09/2022   Adjustment disorder with depressed mood 09/06/2021   Uncomplicated alcohol dependence 06/15/2021   Encounter for commercial driving license (CDL) exam 40/98/1191   Chronic right shoulder pain 09/10/2019   OSA on CPAP 09/10/2019   Morbid obesity 02/11/2019   Headache disorder 08/06/2018   Failure of outpatient treatment    Shortness of breath    Hyperlipidemia associated with type 2 diabetes mellitus 12/07/2017   Hypothyroidism 12/07/2017  Abnormal EKG 10/25/2017   Type 2 diabetes mellitus with diabetic retinopathy 10/25/2017   Essential hypertension 10/04/2017   H/O traumatic brain injury 12/22/2016   S/P rotator cuff repair 10/13/2015   Injury of left rotator cuff 09/22/2015   History of seizure 05/22/2015   Abnormal MRI of head 05/13/2015   Migraine headache without aura 05/13/2015   PCP:  Smitty Cords, DO Pharmacy:   [facility] [address] [place] [address] Phone: [phone] Fax: [phone]  [facility] [id number] [place], [place] - [address] [place] [address] Phone: [phone] Fax: [phone]     Social Determinants of Health (SDOH) Social History: SDOH Screenings   Depression (PHQ2-9): Medium Risk (02/07/2022)  Tobacco Use: High Risk (08/10/2022)   SDOH Interventions:     Readmission Risk Interventions     No data to display

## 2022-08-10 NOTE — Progress Notes (Signed)
PROGRESS NOTE    Shane Frazier.  ZOX:[id number] DOB: August 12, 1965 DOA: 08/09/2022 PCP: Smitty Cords, DO   Brief Narrative:  57 year old with history of CHF EF 45%, HTN, HLD, hypothyroidism, depression, OSA on CPAP, obesity, brain trauma related seizures 2017 not on seizure medication, migraine, alcohol use admitted for chest pain.  Patient was found to have STEMI, seen by cardiologist and underwent urgent left heart catheterization.  Patient was found to have occlusion of LAD, reduced EF.  There was successful PCI to distal LAD and patient was monitored in the ICU.   Assessment & Plan:  Active Problems:   ST elevation myocardial infarction involving left anterior descending (LAD) coronary artery   Chronic combined systolic and diastolic heart failure due to valvular disease   Essential hypertension   Type 2 diabetes mellitus with diabetic retinopathy   HLD (hyperlipidemia)   Alcohol use   OSA on CPAP   Obesity (BMI 30-39.9)     Acute STEMI to LAD status post PCI DES  -Status post PCI on 4/23.  Continue dual antiplatelet therapy.  Further cardiac management per [facility] cardiology -LDL-181, A1c-10.3   Chronic combined systolic and diastolic heart failure due to valvular disease:  Previous EF 45%, Intracath EF down to 35%.  Official echo ordered.  Management per cardiology, CHF team consulted  Essential hypertension -On Coreg, losartan.  IV as needed   Type 2 diabetes mellitus with diabetic retinopathy: Uncontrolled, A1c 10.3 -On outpatient metformin and Ozempic.  Currently on sliding scale and Accu-Cheks   HLD (hyperlipidemia) -Lipitor   Alcohol use Alcohol withdrawal protocol   OSA  -on CPAP   Obesity (BMI 30-39.9): Body weight well 9.1 kg, BMI 36.57 -Diet and exercise  Hypokalemia/hypomagnesemia-repletion  DVT prophylaxis: enoxaparin (LOVENOX) injection 40 mg Start: 08/10/22 0800 Code Status: Full Family Communication:   Status is: Inpatient Continue  cardiac med optimization today.  Likely home tomorrow       Diet Orders (From admission, onward)     Start     Ordered   08/09/22 1137  Diet heart healthy/carb modified Room service appropriate? Yes; Fluid consistency: Thin; Fluid restriction: 2000 mL Fluid  Diet effective now       Question Answer Comment  Diet-HS Snack? Nothing   Room service appropriate? Yes   Fluid consistency: Thin   Fluid restriction: 2000 mL Fluid      08/09/22 1137            Subjective:  Seen at bedside, no complaints.  Feels much better today  Examination:  General exam: Appears calm and comfortable  Respiratory system: Clear to auscultation. Respiratory effort normal. Cardiovascular system: S1 & S2 heard, RRR. No JVD, murmurs, rubs, gallops or clicks. No pedal edema. Gastrointestinal system: Abdomen is nondistended, soft and nontender. No organomegaly or masses felt. Normal bowel sounds heard. Central nervous system: Alert and oriented. No focal neurological deficits. Extremities: Symmetric 5 x 5 power. Skin: No rashes, lesions or ulcers Psychiatry: Judgement and insight appear normal. Mood & affect appropriate.  Objective: Vitals:   08/10/22 0500 08/10/22 0700 08/10/22 0800 08/10/22 0910  BP: 123/79 (!) 148/89    Pulse: 83 72 87   Resp: 20 (!) 23 (!) 23   Temp:    98.2 F (36.8 C)  TempSrc:    Oral  SpO2: 91% 98% 98%   Weight:      Height:        Intake/Output Summary (Last 24 hours) at 08/10/2022 8119 Last data filed  at 08/10/2022 0300 Gross per 24 hour  Intake 785.76 ml  Output 4250 ml  Net -3464.24 ml   Filed Weights   08/09/22 0952 08/09/22 1125  Weight: 111.1 kg 109.1 kg    Scheduled Meds:  aspirin  81 mg Oral Daily   atorvastatin  80 mg Oral QHS   carvedilol  3.125 mg Oral BID WC   Chlorhexidine Gluconate Cloth  6 each Topical Daily   enoxaparin (LOVENOX) injection  40 mg Subcutaneous Q24H   folic acid  1 mg Oral Daily   furosemide  40 mg Intravenous BID    insulin aspart  0-20 Units Subcutaneous TID WC   insulin aspart  0-5 Units Subcutaneous QHS   levothyroxine  175 mcg Oral QAC breakfast   LORazepam  0-4 mg Intravenous Q6H   Followed by   Melene Muller ON 08/11/2022] LORazepam  0-4 mg Intravenous Q12H   losartan  50 mg Oral Daily   multivitamin with minerals  1 tablet Oral Daily   sodium chloride flush  3 mL Intravenous Q12H   thiamine  100 mg Oral Daily   Or   thiamine  100 mg Intravenous Daily   ticagrelor  90 mg Oral BID   Continuous Infusions:  sodium chloride Stopped (08/09/22 1420)   sodium chloride      Nutritional status     Body mass index is 36.57 kg/m.  Data Reviewed:   CBC: Recent Labs  Lab 08/09/22 0956 08/10/22 0429  WBC 3.8* 6.7  NEUTROABS 2.3  --   HGB 15.4 15.6  HCT 47.0 46.3  MCV 90.4 88.2  PLT 227 233   Basic Metabolic Panel: Recent Labs  Lab 08/09/22 0956 08/10/22 0429  NA 139 135  K 3.6 3.2*  CL 103 99  CO2 27 24  GLUCOSE 222* 182*  BUN 17 17  CREATININE 1.25* 1.23  CALCIUM 9.7 9.4  MG  --  1.6*   GFR: Estimated Creatinine Clearance: 81.3 mL/min (by C-G formula based on SCr of 1.23 mg/dL). Liver Function Tests: Recent Labs  Lab 08/09/22 0956  AST 22  ALT 19  ALKPHOS 62  BILITOT 0.5  PROT 8.0  ALBUMIN 4.1   No results for input(s): "LIPASE", "AMYLASE" in the last 168 hours. No results for input(s): "AMMONIA" in the last 168 hours. Coagulation Profile: Recent Labs  Lab 08/09/22 0956  INR 1.0   Cardiac Enzymes: No results for input(s): "CKTOTAL", "CKMB", "CKMBINDEX", "TROPONINI" in the last 168 hours. BNP (last 3 results) No results for input(s): "PROBNP" in the last 8760 hours. HbA1C: Recent Labs    08/09/22 0956  HGBA1C 10.3*   CBG: Recent Labs  Lab 08/09/22 1131 08/09/22 2124 08/10/22 0758  GLUCAP 170* 183* 225*   Lipid Profile: Recent Labs    08/09/22 0956  CHOL 268*  HDL 66  LDLCALC 181*  TRIG 103  CHOLHDL 4.1   Thyroid Function Tests: No results  for input(s): "TSH", "T4TOTAL", "FREET4", "T3FREE", "THYROIDAB" in the last 72 hours. Anemia Panel: No results for input(s): "VITAMINB12", "FOLATE", "FERRITIN", "TIBC", "IRON", "RETICCTPCT" in the last 72 hours. Sepsis Labs: No results for input(s): "PROCALCITON", "LATICACIDVEN" in the last 168 hours.  Recent Results (from the past 240 hour(s))  MRSA Next Gen by PCR, Nasal     Status: None   Collection Time: 08/09/22 11:35 AM   Specimen: Nasal Mucosa; Nasal Swab  Result Value Ref Range Status   MRSA by PCR Next Gen NOT DETECTED NOT DETECTED Final  Comment: (NOTE) The GeneXpert MRSA Assay (FDA approved for NASAL specimens only), is one component of a comprehensive MRSA colonization surveillance program. It is not intended to diagnose MRSA infection nor to guide or monitor treatment for MRSA infections. Test performance is not FDA approved in patients less than 85 years old. Performed at [facility], [address], [place], [place] [address]          Radiology Studies: DG Chest [place] 1 View  Result Date: 08/09/2022 CLINICAL DATA:  Chest pain EXAM: PORTABLE CHEST 1 VIEW COMPARISON:  04/19/2018 FINDINGS: Normal cardiac and mediastinal contours given AP technique. No focal pulmonary opacity. No pleural effusion or pneumothorax. No acute osseous abnormality. IMPRESSION: No acute cardiopulmonary process. Electronically Signed   By: Wiliam Ke M.D.   On: 08/09/2022 13:54   CARDIAC CATHETERIZATION  Result Date: 08/09/2022 Conclusions: Severe single-vessel coronary artery disease with thrombotic occlusion of the distal LAD.  There is 50% stenosis at the ostium of the RCA, which does not appear obstructive. Severely reduced left ventricular systolic function (LVEF 25-35%) with severely elevated filling pressure (LVEDP 40 mmHg).  Degree of LVEF is out of proportion to CAD, suggestive of predominantly nonischemic cardiomyopathy. Successful PCI to distal LAD occlusion using Onyx  Frontier 2.25 x 18 mm drug-eluting stent with 0% residual stenosis and TIMI-3 flow. Recommendations: Dual antiplatelet therapy with aspirin and ticagrelor for at least 12 months. Obtain echocardiogram to reassess LVEF and exclude LV thrombus in the setting of distal LAD occlusion and severely reduced LVEF. Continue diuresis and judicious escalation of goal-directed medical therapy as tolerated. Discontinue cangrelor infusion 2 hours after ticagrelor load was given (infusion to end at 1 PM). Yvonne Kendall, MD [facility]          LOS: 1 day   Time spent= 35 mins    Seriyah Collison Joline Maxcy, MD [facility]  If 7PM-7AM, please contact night-coverage  08/10/2022, 9:27 AM

## 2022-08-11 DIAGNOSIS — I5023 Acute on chronic systolic (congestive) heart failure: Secondary | ICD-10-CM | POA: Diagnosis not present

## 2022-08-11 LAB — BASIC METABOLIC PANEL
Anion gap: 11 (ref 5–15)
BUN: 31 mg/dL — ABNORMAL HIGH (ref 6–20)
CO2: 24 mmol/L (ref 22–32)
Calcium: 8.9 mg/dL (ref 8.9–10.3)
Chloride: 99 mmol/L (ref 98–111)
Creatinine, Ser: 1.39 mg/dL — ABNORMAL HIGH (ref 0.61–1.24)
GFR, Estimated: 60 mL/min — ABNORMAL LOW (ref >60)
Glucose, Bld: 123 mg/dL — ABNORMAL HIGH (ref 70–99)
Potassium: 3.4 mmol/L — ABNORMAL LOW (ref 3.5–5.1)
Sodium: 134 mmol/L — ABNORMAL LOW (ref 135–145)

## 2022-08-11 LAB — GLUCOSE, CAPILLARY
Glucose-Capillary: 132 mg/dL — ABNORMAL HIGH (ref 70–99)
Glucose-Capillary: 288 mg/dL — ABNORMAL HIGH (ref 70–99)
Glucose-Capillary: 136 mg/dL — ABNORMAL HIGH (ref 70–99)
Glucose-Capillary: 194 mg/dL — ABNORMAL HIGH (ref 70–99)

## 2022-08-11 LAB — MAGNESIUM: Magnesium: 2 mg/dL (ref 1.7–2.4)

## 2022-08-11 LAB — LIPOPROTEIN A (LPA): Lipoprotein (a): 158.6 nmol/L — ABNORMAL HIGH (ref <75.0)

## 2022-08-11 MED ORDER — POTASSIUM CHLORIDE CRYS ER 20 MEQ PO TBCR
40.0000 meq | EXTENDED_RELEASE_TABLET | ORAL | Status: AC
  Administered 2022-08-11 (×2): 40 meq via ORAL
  Filled 2022-08-11 (×2): qty 2

## 2022-08-11 NOTE — Progress Notes (Signed)
PHARMACY CONSULT NOTE - FOLLOW UP  Pharmacy Consult for Electrolyte Monitoring and Replacement   Recent Labs: Potassium (mmol/L)  Date Value  08/11/2022 3.4 (L)   Magnesium (mg/dL)  Date Value  65/78/4696 2.0   Calcium (mg/dL)  Date Value  29/52/8413 8.9   Albumin (g/dL)  Date Value  24/40/1027 4.1   Sodium  Date Value  08/11/2022 134 mmol/L (L)  10/17/2017 141     Assessment: 57 year old male admitted with STEMI and acute heart failure.   Goal of Therapy:  K > 4  Mag > 2  Plan:  Kcl PO every 4 hours x 2 doses Follow up BMP and mag tomorrow AM  Jaynie Bream ,PharmD Clinical Pharmacist 08/11/2022 8:30 AM

## 2022-08-11 NOTE — Plan of Care (Signed)

## 2022-08-11 NOTE — Progress Notes (Signed)
Advanced Heart Failure Rounding Note   Subjective:    Feels good. No CP or SOB.   BP improved. No ETOH WD symptoms    Objective:   Weight Range:  Vital Signs:   Temp:  [97.8 F (36.6 C)-98.1 F (36.7 C)] 98.1 F (36.7 C) (04/24 2317) Pulse Rate:  [68-92] 69 (04/25 0600) Resp:  [12-30] 12 (04/25 0600) BP: (87-130)/(64-93) 121/93 (04/25 0600) SpO2:  [90 %-100 %] 100 % (04/25 0600) FiO2 (%):  [21 %] 21 % (04/24 2244)    Weight change: Filed Weights   08/09/22 0952 08/09/22 1125  Weight: 111.1 kg 109.1 kg    Intake/Output:   Intake/Output Summary (Last 24 hours) at 08/11/2022 1057 Last data filed at 08/11/2022 1015 Gross per 24 hour  Intake 1133.79 ml  Output 1000 ml  Net 133.79 ml     Physical Exam: General:  Well appearing. No resp difficulty HEENT: normal Neck: supple. JVP flat . Carotids 2+ bilat; no bruits. No lymphadenopathy or thryomegaly appreciated. Cor: PMI nondisplaced. Regular rate & rhythm. No rubs, gallops or murmurs. Lungs: clear Abdomen: obese  soft, nontender, nondistended. No hepatosplenomegaly. No bruits or masses. Good bowel sounds. Extremities: no cyanosis, clubbing, rash, edema Neuro: alert & orientedx3, cranial nerves grossly intact. moves all 4 extremities w/o difficulty. Affect pleasant  Telemetry: Sinus 60s Personally reviewed  Labs: Basic Metabolic Panel: Recent Labs  Lab 08/09/22 0956 08/10/22 0429 08/11/22 0541  NA 139 135 134*  K 3.6 3.2* 3.4*  CL 103 99 99  CO2 27 24 24   GLUCOSE 222* 182* 123*  BUN 17 17 31*  CREATININE 1.25* 1.23 1.39*  CALCIUM 9.7 9.4 8.9  MG  --  1.6* 2.0    Liver Function Tests: Recent Labs  Lab 08/09/22 0956  AST 22  ALT 19  ALKPHOS 62  BILITOT 0.5  PROT 8.0  ALBUMIN 4.1   No results for input(s): "LIPASE", "AMYLASE" in the last 168 hours. No results for input(s): "AMMONIA" in the last 168 hours.  CBC: Recent Labs  Lab 08/09/22 0956 08/10/22 0429  WBC 3.8* 6.7  NEUTROABS 2.3   --   HGB 15.4 15.6  HCT 47.0 46.3  MCV 90.4 88.2  PLT 227 233    Cardiac Enzymes: No results for input(s): "CKTOTAL", "CKMB", "CKMBINDEX", "TROPONINI" in the last 168 hours.  BNP: BNP (last 3 results) Recent Labs    08/09/22 1153  BNP 177.4*    ProBNP (last 3 results) No results for input(s): "PROBNP" in the last 8760 hours.    Other results:  Imaging: ECHOCARDIOGRAM COMPLETE  Result Date: 08/10/2022    ECHOCARDIOGRAM REPORT   Patient Name:   Shane Frazier. Date of Exam: 08/10/2022 Medical Rec #:  [id number]      Height:       68.0 in Accession #:    [id number]     Weight:       240.5 lb Date of Birth:  [birth year]      BSA:          2.210 m Patient Age:    56 years       BP:           148/89 mmHg Patient Gender: M              HR:           87 bpm. Exam Location:  [facility] Procedure: 2D Echo, Cardiac Doppler and Color Doppler Indications:  Acute myocardial Infarction, unspecified I21.9  History:         Patient has prior history of Echocardiogram examinations, most                  recent 03/26/2018.  Sonographer:     Cristela Blue Referring Phys:  [id number] Diagnosing Phys: Debbe Odea MD IMPRESSIONS  1. Left ventricular ejection fraction, by estimation, is 25 to 30%. The left ventricle has severely decreased function. The left ventricle demonstrates global hypokinesis. There is mild left ventricular hypertrophy. Left ventricular diastolic parameters  are consistent with Grade I diastolic dysfunction (impaired relaxation).  2. Right ventricular systolic function is low normal. The right ventricular size is normal.  3. The mitral valve is normal in structure. No evidence of mitral valve regurgitation.  4. The aortic valve is grossly normal. Aortic valve regurgitation is not visualized. FINDINGS  Left Ventricle: Left ventricular ejection fraction, by estimation, is 25 to 30%. The left ventricle has severely decreased function. The left ventricle demonstrates global hypokinesis.  The left ventricular internal cavity size was normal in size. There is mild left ventricular hypertrophy. Left ventricular diastolic parameters are consistent with Grade I diastolic dysfunction (impaired relaxation). Right Ventricle: The right ventricular size is normal. No increase in right ventricular wall thickness. Right ventricular systolic function is low normal. Left Atrium: Left atrial size was normal in size. Right Atrium: Right atrial size was normal in size. Pericardium: There is no evidence of pericardial effusion. Mitral Valve: The mitral valve is normal in structure. No evidence of mitral valve regurgitation. Tricuspid Valve: The tricuspid valve is normal in structure. Tricuspid valve regurgitation is not demonstrated. Aortic Valve: The aortic valve is grossly normal. Aortic valve regurgitation is not visualized. Aortic valve mean gradient measures 3.0 mmHg. Aortic valve peak gradient measures 4.4 mmHg. Aortic valve area, by VTI measures 2.44 cm. Pulmonic Valve: The pulmonic valve was not well visualized. Pulmonic valve regurgitation is not visualized. Aorta: The aortic root is normal in size and structure. Venous: The inferior vena cava was not well visualized. IAS/Shunts: No atrial level shunt detected by color flow Doppler.  LEFT VENTRICLE PLAX 2D LVIDd:         5.10 cm      Diastology LVIDs:         4.50 cm      LV e' medial:    4.13 cm/s LV PW:         1.70 cm      LV E/e' medial:  7.2 LV IVS:        1.40 cm      LV e' lateral:   4.57 cm/s LVOT diam:     2.45 cm      LV E/e' lateral: 6.5 LV SV:         35 LV SV Index:   16 LVOT Area:     4.71 cm  LV Volumes (MOD) LV vol d, MOD A2C: 131.0 ml LV vol d, MOD A4C: 193.0 ml LV vol s, MOD A2C: 126.0 ml LV vol s, MOD A4C: 146.0 ml LV SV MOD A2C:     5.0 ml LV SV MOD A4C:     193.0 ml LV SV MOD BP:      20.9 ml RIGHT VENTRICLE RV Basal diam:  3.30 cm RV Mid diam:    2.00 cm LEFT ATRIUM             Index  RIGHT ATRIUM           Index LA diam:         4.40 cm 1.99 cm/m   RA Area:     10.80 cm LA Vol (A2C):   49.0 ml 22.17 ml/m  RA Volume:   23.70 ml  10.72 ml/m LA Vol (A4C):   44.0 ml 19.91 ml/m LA Biplane Vol: 48.3 ml 21.85 ml/m  AORTIC VALVE AV Area (Vmax):    2.34 cm AV Area (Vmean):   2.39 cm AV Area (VTI):     2.44 cm AV Vmax:           104.45 cm/s AV Vmean:          83.200 cm/s AV VTI:            0.145 m AV Peak Grad:      4.4 mmHg AV Mean Grad:      3.0 mmHg LVOT Vmax:         51.90 cm/s LVOT Vmean:        42.200 cm/s LVOT VTI:          0.075 m LVOT/AV VTI ratio: 0.52  AORTA Ao Root diam: 3.60 cm MITRAL VALVE               TRICUSPID VALVE MV Area (PHT): 5.16 cm    TR Peak grad:   7.3 mmHg MV Decel Time: 147 msec    TR Vmax:        135.00 cm/s MV E velocity: 29.90 cm/s MV A velocity: 49.80 cm/s  SHUNTS MV E/A ratio:  0.60        Systemic VTI:  0.07 m                            Systemic Diam: 2.45 cm Debbe Odea MD Electronically signed by Debbe Odea MD Signature Date/Time: 08/10/2022/1:26:53 PM    Final    DG Chest [place] 1 View  Result Date: 08/09/2022 CLINICAL DATA:  Chest pain EXAM: PORTABLE CHEST 1 VIEW COMPARISON:  04/19/2018 FINDINGS: Normal cardiac and mediastinal contours given AP technique. No focal pulmonary opacity. No pleural effusion or pneumothorax. No acute osseous abnormality. IMPRESSION: No acute cardiopulmonary process. Electronically Signed   By: Wiliam Ke M.D.   On: 08/09/2022 13:54     Medications:     Scheduled Medications:  aspirin  81 mg Oral Daily   atorvastatin  80 mg Oral QHS   carvedilol  3.125 mg Oral BID WC   Chlorhexidine Gluconate Cloth  6 each Topical Daily   enoxaparin (LOVENOX) injection  40 mg Subcutaneous Q24H   folic acid  1 mg Oral Daily   insulin aspart  0-20 Units Subcutaneous TID WC   insulin aspart  0-5 Units Subcutaneous QHS   insulin glargine-yfgn  5 Units Subcutaneous Daily   levothyroxine  175 mcg Oral QAC breakfast   LORazepam  0-4 mg Intravenous Q6H   Followed by    LORazepam  0-4 mg Intravenous Q12H   multivitamin with minerals  1 tablet Oral Daily   potassium chloride  40 mEq Oral Q4H   sacubitril-valsartan  1 tablet Oral BID   sodium chloride flush  3 mL Intravenous Q12H   spironolactone  12.5 mg Oral Daily   thiamine  100 mg Oral Daily   Or   thiamine  100 mg Intravenous Daily   ticagrelor  90 mg Oral BID  Infusions:  sodium chloride Stopped (08/09/22 1420)   sodium chloride Stopped (08/10/22 1435)    PRN Medications: sodium chloride, acetaminophen, LORazepam **OR** LORazepam, morphine injection, nitroGLYCERIN, ondansetron (ZOFRAN) IV, sodium chloride flush   Assessment/Plan:   1. Acute anterior STEMI - Cath 08/09/22 with thrombotic occlusion of distal LAD/ There was a 50% ostial RCA lesion and Lcx was ok. EF 25-35% LVEDP 40   - s/p PCI/DES of distal LAD with 2.25x18 DES.  - Given h/o probable underlying cardiomyopathy and flush distal occlusion of LAD, possibility of embolic event from LV entertained but I reviewed cath images with Dr. Okey Dupre and we agree that this was likely native plaque rupture - CP free - Continue DAPT/statin - Refer CR   2. Acute on chronic systolic HF - Echo 2019 with EF 40-45% - Echo 08/10/22 reviewed personally EF 20-25% with high sphericity index. RV ok . - Agree that LV dysfunction is out of proportion to acute MI and likely represents longstanding NICM due to HTN or ETOH - Diuresed well with IV lasix - Continue Entresto 49/51 - Continue spiro 12.5 - Continue carvedilol 3.125 bid - Add Farxiga 10 - Discussed need for ETOH cessation and BP control  - Wil get cMRI - CR consult   3. Severe HTN - meds as above - now much improved - needs to wear CPAP   4. ETOH abuse - discussed need for cessation  - CIWA protocol in place   5. DM2, poorly controlled - per primary - A1c 10.3 (08/09/22) - Add SGLT2i tomorrow   6. Hypokalemia/hypomag - supp   D/w Dr. Nelson Chimes at bedside  Length of Stay:  2   Arvilla Meres MD 08/11/2022, 10:57 AM  Advanced Heart Failure Team Pager [phone] (M-F; 7a - 4p)  Please contact [facility] for night-coverage after hours (4p -7a ) and weekends on [email or web address]

## 2022-08-11 NOTE — Progress Notes (Signed)
PROGRESS NOTE    Shane Frazier.  RUE:[id number] DOB: [birth year] DOA: 08/09/2022 PCP: Smitty Cords, DO   Brief Narrative:  57 year old with history of CHF EF 45%, HTN, HLD, hypothyroidism, depression, OSA on CPAP, obesity, brain trauma related seizures 2017 not on seizure medication, migraine, alcohol use admitted for chest pain.  Patient was found to have STEMI, seen by cardiologist and underwent urgent left heart catheterization.  Patient was found to have occlusion of LAD, reduced EF.  There was successful PCI to distal LAD and patient was monitored in the ICU.  CHF team consulted for GDMT.   Assessment & Plan:  Active Problems:   ST elevation myocardial infarction involving left anterior descending (LAD) coronary artery   Chronic combined systolic and diastolic heart failure due to valvular disease   Essential hypertension   Type 2 diabetes mellitus with diabetic retinopathy   HLD (hyperlipidemia)   Alcohol use   OSA on CPAP   Obesity (BMI 30-39.9)     Acute STEMI to LAD status post PCI DES  -Status post PCI on 4/23.  Continue dual antiplatelet therapy.  Further cardiac management per [facility] cardiology -LDL-181, A1c-10.3   Chronic combined systolic and diastolic heart failure due to valvular disease:  Previous EF 45%, Intracath EF down to 35%.  Echo shows EF of 35% with global hypokinesia.  CHF team following.  Planning cardiac MRI  Essential hypertension -On Coreg, losartan.  IV as needed   Type 2 diabetes mellitus with diabetic retinopathy: Uncontrolled, A1c 10.3 -On outpatient metformin and Ozempic.  Currently on sliding scale and Accu-Cheks   HLD (hyperlipidemia) -Lipitor   Alcohol use Alcohol withdrawal protocol   OSA  -on CPAP   Obesity (BMI 30-39.9): Body weight well 9.1 kg, BMI 36.57 -Diet and exercise  Hypokalemia/hypomagnesemia-repletion  DVT prophylaxis: enoxaparin (LOVENOX) injection 40 mg Start: 08/10/22 0800 Code Status: Full Family  Communication:   Status is: Inpatient Plans for cardiac MRI.  CHF team following.  Anticipating discharge tomorrow       Diet Orders (From admission, onward)     Start     Ordered   08/09/22 1137  Diet heart healthy/carb modified Room service appropriate? Yes; Fluid consistency: Thin; Fluid restriction: 2000 mL Fluid  Diet effective now       Question Answer Comment  Diet-HS Snack? Nothing   Room service appropriate? Yes   Fluid consistency: Thin   Fluid restriction: 2000 mL Fluid      08/09/22 1137            Subjective:  Feeling okay no complaints Examination:  Constitutional: Not in acute distress Respiratory: Clear to auscultation bilaterally Cardiovascular: Normal sinus rhythm, no rubs Abdomen: Nontender nondistended good bowel sounds Musculoskeletal: No edema noted Skin: No rashes seen Neurologic: CN 2-12 grossly intact.  And nonfocal Psychiatric: Normal judgment and insight. Alert and oriented x 3. Normal mood. Objective: Vitals:   08/11/22 0300 08/11/22 0400 08/11/22 0500 08/11/22 0600  BP: 98/74 92/78 124/80 (!) 121/93  Pulse: 72 76 80 69  Resp: 14 20 (!) 21 12  Temp:      TempSrc:      SpO2: 92% 93% 96% 100%  Weight:      Height:        Intake/Output Summary (Last 24 hours) at 08/11/2022 1041 Last data filed at 08/11/2022 1015 Gross per 24 hour  Intake 1133.79 ml  Output 1000 ml  Net 133.79 ml   Filed Weights   08/09/22 0952 08/09/22  1125  Weight: 111.1 kg 109.1 kg    Scheduled Meds:  aspirin  81 mg Oral Daily   atorvastatin  80 mg Oral QHS   carvedilol  3.125 mg Oral BID WC   Chlorhexidine Gluconate Cloth  6 each Topical Daily   enoxaparin (LOVENOX) injection  40 mg Subcutaneous Q24H   folic acid  1 mg Oral Daily   insulin aspart  0-20 Units Subcutaneous TID WC   insulin aspart  0-5 Units Subcutaneous QHS   insulin glargine-yfgn  5 Units Subcutaneous Daily   levothyroxine  175 mcg Oral QAC breakfast   LORazepam  0-4 mg Intravenous  Q6H   Followed by   LORazepam  0-4 mg Intravenous Q12H   multivitamin with minerals  1 tablet Oral Daily   potassium chloride  40 mEq Oral Q4H   sacubitril-valsartan  1 tablet Oral BID   sodium chloride flush  3 mL Intravenous Q12H   spironolactone  12.5 mg Oral Daily   thiamine  100 mg Oral Daily   Or   thiamine  100 mg Intravenous Daily   ticagrelor  90 mg Oral BID   Continuous Infusions:  sodium chloride Stopped (08/09/22 1420)   sodium chloride Stopped (08/10/22 1435)    Nutritional status     Body mass index is 36.57 kg/m.  Data Reviewed:   CBC: Recent Labs  Lab 08/09/22 0956 08/10/22 0429  WBC 3.8* 6.7  NEUTROABS 2.3  --   HGB 15.4 15.6  HCT 47.0 46.3  MCV 90.4 88.2  PLT 227 233   Basic Metabolic Panel: Recent Labs  Lab 08/09/22 0956 08/10/22 0429 08/11/22 0541  NA 139 135 134*  K 3.6 3.2* 3.4*  CL 103 99 99  CO2 27 24 24   GLUCOSE 222* 182* 123*  BUN 17 17 31*  CREATININE 1.25* 1.23 1.39*  CALCIUM 9.7 9.4 8.9  MG  --  1.6* 2.0   GFR: Estimated Creatinine Clearance: 71.9 mL/min (A) (by C-G formula based on SCr of 1.39 mg/dL (H)). Liver Function Tests: Recent Labs  Lab 08/09/22 0956  AST 22  ALT 19  ALKPHOS 62  BILITOT 0.5  PROT 8.0  ALBUMIN 4.1   No results for input(s): "LIPASE", "AMYLASE" in the last 168 hours. No results for input(s): "AMMONIA" in the last 168 hours. Coagulation Profile: Recent Labs  Lab 08/09/22 0956  INR 1.0   Cardiac Enzymes: No results for input(s): "CKTOTAL", "CKMB", "CKMBINDEX", "TROPONINI" in the last 168 hours. BNP (last 3 results) No results for input(s): "PROBNP" in the last 8760 hours. HbA1C: Recent Labs    08/09/22 0956  HGBA1C 10.3*   CBG: Recent Labs  Lab 08/10/22 0758 08/10/22 1206 08/10/22 1535 08/10/22 2158 08/11/22 0748  GLUCAP 225* 241* 172* 135* 132*   Lipid Profile: Recent Labs    08/09/22 0956  CHOL 268*  HDL 66  LDLCALC 181*  TRIG 103  CHOLHDL 4.1   Thyroid Function  Tests: No results for input(s): "TSH", "T4TOTAL", "FREET4", "T3FREE", "THYROIDAB" in the last 72 hours. Anemia Panel: No results for input(s): "VITAMINB12", "FOLATE", "FERRITIN", "TIBC", "IRON", "RETICCTPCT" in the last 72 hours. Sepsis Labs: No results for input(s): "PROCALCITON", "LATICACIDVEN" in the last 168 hours.  Recent Results (from the past 240 hour(s))  MRSA Next Gen by PCR, Nasal     Status: None   Collection Time: 08/09/22 11:35 AM   Specimen: Nasal Mucosa; Nasal Swab  Result Value Ref Range Status   MRSA by PCR Next Gen NOT  DETECTED NOT DETECTED Final    Comment: (NOTE) The GeneXpert MRSA Assay (FDA approved for NASAL specimens only), is one component of a comprehensive MRSA colonization surveillance program. It is not intended to diagnose MRSA infection nor to guide or monitor treatment for MRSA infections. Test performance is not FDA approved in patients less than 50 years old. Performed at [facility], [address] [place], [place] [address]          Radiology Studies: ECHOCARDIOGRAM COMPLETE  Result Date: 08/10/2022    ECHOCARDIOGRAM REPORT   Patient Name:   Davian Hanshaw. Date of Exam: 08/10/2022 Medical Rec #:  [id number]      Height:       68.0 in Accession #:    [id number]     Weight:       240.5 lb Date of Birth:  [birth year]      BSA:          2.210 m Patient Age:    56 years       BP:           148/89 mmHg Patient Gender: M              HR:           87 bpm. Exam Location:  [facility] Procedure: 2D Echo, Cardiac Doppler and Color Doppler Indications:     Acute myocardial Infarction, unspecified I21.9  History:         Patient has prior history of Echocardiogram examinations, most                  recent 03/26/2018.  Sonographer:     Cristela Blue Referring Phys:  [id number] CHRISTOPHER END Diagnosing Phys: Debbe Odea MD IMPRESSIONS  1. Left ventricular ejection fraction, by estimation, is 25 to 30%. The left ventricle has severely decreased function. The left  ventricle demonstrates global hypokinesis. There is mild left ventricular hypertrophy. Left ventricular diastolic parameters  are consistent with Grade I diastolic dysfunction (impaired relaxation).  2. Right ventricular systolic function is low normal. The right ventricular size is normal.  3. The mitral valve is normal in structure. No evidence of mitral valve regurgitation.  4. The aortic valve is grossly normal. Aortic valve regurgitation is not visualized. FINDINGS  Left Ventricle: Left ventricular ejection fraction, by estimation, is 25 to 30%. The left ventricle has severely decreased function. The left ventricle demonstrates global hypokinesis. The left ventricular internal cavity size was normal in size. There is mild left ventricular hypertrophy. Left ventricular diastolic parameters are consistent with Grade I diastolic dysfunction (impaired relaxation). Right Ventricle: The right ventricular size is normal. No increase in right ventricular wall thickness. Right ventricular systolic function is low normal. Left Atrium: Left atrial size was normal in size. Right Atrium: Right atrial size was normal in size. Pericardium: There is no evidence of pericardial effusion. Mitral Valve: The mitral valve is normal in structure. No evidence of mitral valve regurgitation. Tricuspid Valve: The tricuspid valve is normal in structure. Tricuspid valve regurgitation is not demonstrated. Aortic Valve: The aortic valve is grossly normal. Aortic valve regurgitation is not visualized. Aortic valve mean gradient measures 3.0 mmHg. Aortic valve peak gradient measures 4.4 mmHg. Aortic valve area, by VTI measures 2.44 cm. Pulmonic Valve: The pulmonic valve was not well visualized. Pulmonic valve regurgitation is not visualized. Aorta: The aortic root is normal in size and structure. Venous: The inferior vena cava was not well visualized. IAS/Shunts: No atrial level shunt  detected by color flow Doppler.  LEFT VENTRICLE PLAX 2D  LVIDd:         5.10 cm      Diastology LVIDs:         4.50 cm      LV e' medial:    4.13 cm/s LV PW:         1.70 cm      LV E/e' medial:  7.2 LV IVS:        1.40 cm      LV e' lateral:   4.57 cm/s LVOT diam:     2.45 cm      LV E/e' lateral: 6.5 LV SV:         35 LV SV Index:   16 LVOT Area:     4.71 cm  LV Volumes (MOD) LV vol d, MOD A2C: 131.0 ml LV vol d, MOD A4C: 193.0 ml LV vol s, MOD A2C: 126.0 ml LV vol s, MOD A4C: 146.0 ml LV SV MOD A2C:     5.0 ml LV SV MOD A4C:     193.0 ml LV SV MOD BP:      20.9 ml RIGHT VENTRICLE RV Basal diam:  3.30 cm RV Mid diam:    2.00 cm LEFT ATRIUM             Index        RIGHT ATRIUM           Index LA diam:        4.40 cm 1.99 cm/m   RA Area:     10.80 cm LA Vol (A2C):   49.0 ml 22.17 ml/m  RA Volume:   23.70 ml  10.72 ml/m LA Vol (A4C):   44.0 ml 19.91 ml/m LA Biplane Vol: 48.3 ml 21.85 ml/m  AORTIC VALVE AV Area (Vmax):    2.34 cm AV Area (Vmean):   2.39 cm AV Area (VTI):     2.44 cm AV Vmax:           104.45 cm/s AV Vmean:          83.200 cm/s AV VTI:            0.145 m AV Peak Grad:      4.4 mmHg AV Mean Grad:      3.0 mmHg LVOT Vmax:         51.90 cm/s LVOT Vmean:        42.200 cm/s LVOT VTI:          0.075 m LVOT/AV VTI ratio: 0.52  AORTA Ao Root diam: 3.60 cm MITRAL VALVE               TRICUSPID VALVE MV Area (PHT): 5.16 cm    TR Peak grad:   7.3 mmHg MV Decel Time: 147 msec    TR Vmax:        135.00 cm/s MV E velocity: 29.90 cm/s MV A velocity: 49.80 cm/s  SHUNTS MV E/A ratio:  0.60        Systemic VTI:  0.07 m                            Systemic Diam: 2.45 cm Debbe Odea MD Electronically signed by Debbe Odea MD Signature Date/Time: 08/10/2022/1:26:53 PM    Final    DG Chest Port 1 View  Result Date: 08/09/2022 CLINICAL DATA:  Chest pain EXAM: PORTABLE CHEST 1 VIEW COMPARISON:  04/19/2018 FINDINGS: Normal cardiac and mediastinal contours given AP technique. No focal pulmonary opacity. No pleural effusion or pneumothorax. No acute osseous  abnormality. IMPRESSION: No acute cardiopulmonary process. Electronically Signed   By: Wiliam Ke M.D.   On: 08/09/2022 13:54           LOS: 2 days   Time spent= 35 mins    Shawntae Lowy Joline Maxcy, MD [facility]  If 7PM-7AM, please contact night-coverage  08/11/2022, 10:41 AM

## 2022-08-12 ENCOUNTER — Other Ambulatory Visit: Payer: Self-pay

## 2022-08-12 ENCOUNTER — Inpatient Hospital Stay (HOSPITAL_COMMUNITY): Payer: 59

## 2022-08-12 DIAGNOSIS — I213 ST elevation (STEMI) myocardial infarction of unspecified site: Secondary | ICD-10-CM

## 2022-08-12 DIAGNOSIS — I429 Cardiomyopathy, unspecified: Secondary | ICD-10-CM | POA: Diagnosis not present

## 2022-08-12 LAB — BASIC METABOLIC PANEL
Anion gap: 7 (ref 5–15)
BUN: 36 mg/dL — ABNORMAL HIGH (ref 6–20)
CO2: 25 mmol/L (ref 22–32)
Calcium: 8.8 mg/dL — ABNORMAL LOW (ref 8.9–10.3)
Chloride: 104 mmol/L (ref 98–111)
Creatinine, Ser: 1.27 mg/dL — ABNORMAL HIGH (ref 0.61–1.24)
GFR, Estimated: >60 mL/min (ref >60)
Glucose, Bld: 170 mg/dL — ABNORMAL HIGH (ref 70–99)
Potassium: 3.7 mmol/L (ref 3.5–5.1)
Sodium: 136 mmol/L (ref 135–145)

## 2022-08-12 LAB — GLUCOSE, CAPILLARY: Glucose-Capillary: 191 mg/dL — ABNORMAL HIGH (ref 70–99)

## 2022-08-12 LAB — MAGNESIUM: Magnesium: 2.3 mg/dL (ref 1.7–2.4)

## 2022-08-12 MED ORDER — ATORVASTATIN CALCIUM 80 MG PO TABS
80.0000 mg | ORAL_TABLET | Freq: Every day | ORAL | 0 refills | Status: AC
  Filled 2022-08-12: qty 30, 30d supply, fill #0

## 2022-08-12 MED ORDER — VITAMIN B-1 100 MG PO TABS
100.0000 mg | ORAL_TABLET | Freq: Every day | ORAL | 0 refills | Status: AC
  Filled 2022-08-12: qty 30, 30d supply, fill #0

## 2022-08-12 MED ORDER — CARVEDILOL 3.125 MG PO TABS
3.1250 mg | ORAL_TABLET | Freq: Two times a day (BID) | ORAL | 0 refills | Status: DC
  Filled 2022-08-12: qty 60, 30d supply, fill #0

## 2022-08-12 MED ORDER — DAPAGLIFLOZIN PROPANEDIOL 10 MG PO TABS
10.0000 mg | ORAL_TABLET | Freq: Every day | ORAL | Status: DC
  Administered 2022-08-12: 10 mg via ORAL
  Filled 2022-08-12: qty 1

## 2022-08-12 MED ORDER — DAPAGLIFLOZIN PROPANEDIOL 10 MG PO TABS
10.0000 mg | ORAL_TABLET | Freq: Every day | ORAL | 0 refills | Status: AC
  Filled 2022-08-12: qty 30, 30d supply, fill #0

## 2022-08-12 MED ORDER — GADOBUTROL 1 MMOL/ML IV SOLN: 10.0000 mL | Freq: Once | INTRAVENOUS | Status: DC | PRN

## 2022-08-12 MED ORDER — TICAGRELOR 90 MG PO TABS
90.0000 mg | ORAL_TABLET | Freq: Two times a day (BID) | ORAL | 0 refills | Status: AC
  Filled 2022-08-12: qty 60, 30d supply, fill #0

## 2022-08-12 MED ORDER — FOLIC ACID 1 MG PO TABS
1.0000 mg | ORAL_TABLET | Freq: Every day | ORAL | 0 refills | Status: AC
  Filled 2022-08-12: qty 30, 30d supply, fill #0

## 2022-08-12 MED ORDER — SACUBITRIL-VALSARTAN 49-51 MG PO TABS
1.0000 | ORAL_TABLET | Freq: Two times a day (BID) | ORAL | 0 refills | Status: DC
  Filled 2022-08-12: qty 60, 30d supply, fill #0

## 2022-08-12 MED ORDER — ORAL CARE MOUTH RINSE: 15.0000 mL | OROMUCOSAL | Status: DC | PRN

## 2022-08-12 MED ORDER — GADOBUTROL 1 MMOL/ML IV SOLN
13.0000 mL | Freq: Once | INTRAVENOUS | Status: AC | PRN
  Administered 2022-08-12: 13 mL via INTRAVENOUS

## 2022-08-12 MED ORDER — POTASSIUM CHLORIDE CRYS ER 20 MEQ PO TBCR
40.0000 meq | EXTENDED_RELEASE_TABLET | Freq: Once | ORAL | Status: AC
  Administered 2022-08-12: 40 meq via ORAL
  Filled 2022-08-12: qty 2

## 2022-08-12 MED ORDER — ALPRAZOLAM 0.5 MG PO TABS
1.0000 mg | ORAL_TABLET | Freq: Once | ORAL | Status: AC
  Administered 2022-08-12: 1 mg via ORAL
  Filled 2022-08-12: qty 2

## 2022-08-12 MED ORDER — FUROSEMIDE 40 MG PO TABS
40.0000 mg | ORAL_TABLET | Freq: Every day | ORAL | 0 refills | Status: DC | PRN
  Filled 2022-08-12: qty 30, 30d supply, fill #0

## 2022-08-12 MED ORDER — ASPIRIN 81 MG PO CHEW
81.0000 mg | CHEWABLE_TABLET | Freq: Every day | ORAL | 0 refills | Status: AC
  Filled 2022-08-12: qty 30, 30d supply, fill #0

## 2022-08-12 MED ORDER — SPIRONOLACTONE 25 MG PO TABS
12.5000 mg | ORAL_TABLET | Freq: Every day | ORAL | 0 refills | Status: AC
  Filled 2022-08-12: qty 30, 60d supply, fill #0

## 2022-08-12 MED ORDER — ADULT MULTIVITAMIN W/MINERALS CH: 1.0000 | ORAL_TABLET | Freq: Every day | ORAL | Status: AC

## 2022-08-12 NOTE — Progress Notes (Signed)
Advanced Heart Failure Rounding Note   Subjective:    Denies CP or SOB. Wants to go home   Objective:   Weight Range:  Vital Signs:   Temp:  [97.5 F (36.4 C)-98.8 F (37.1 C)] 98.6 F (37 C) (04/26 0745) Pulse Rate:  [77-91] 91 (04/26 0745) Resp:  [16-22] 17 (04/26 0745) BP: (100-126)/(67-89) 105/85 (04/26 0745) SpO2:  [95 %-100 %] 100 % (04/26 0745)    Weight change: Filed Weights   08/09/22 0952 08/09/22 1125  Weight: 111.1 kg 109.1 kg    Intake/Output:   Intake/Output Summary (Last 24 hours) at 08/12/2022 0836 Last data filed at 08/11/2022 1926 Gross per 24 hour  Intake 960 ml  Output --  Net 960 ml      Physical Exam: General:  Well appearing. No resp difficulty HEENT: normal Neck: supple. no JVD. Carotids 2+ bilat; no bruits. No lymphadenopathy or thryomegaly appreciated. Cor: PMI nondisplaced. Regular rate & rhythm. No rubs, gallops or murmurs. Lungs: clear Abdomen: soft, nontender, nondistended. No hepatosplenomegaly. No bruits or masses. Good bowel sounds. Extremities: no cyanosis, clubbing, rash, edema Neuro: alert & orientedx3, cranial nerves grossly intact. moves all 4 extremities w/o difficulty. Affect pleasant    Telemetry: Sinus 70-90s Personally reviewed  Labs: Basic Metabolic Panel: Recent Labs  Lab 08/09/22 0956 08/10/22 0429 08/11/22 0541 08/12/22 0335  NA 139 135 134* 136  K 3.6 3.2* 3.4* 3.7  CL 103 99 99 104  CO2 27 24 24 25   GLUCOSE 222* 182* 123* 170*  BUN 17 17 31* 36*  CREATININE 1.25* 1.23 1.39* 1.27*  CALCIUM 9.7 9.4 8.9 8.8*  MG  --  1.6* 2.0 2.3     Liver Function Tests: Recent Labs  Lab 08/09/22 0956  AST 22  ALT 19  ALKPHOS 62  BILITOT 0.5  PROT 8.0  ALBUMIN 4.1    No results for input(s): "LIPASE", "AMYLASE" in the last 168 hours. No results for input(s): "AMMONIA" in the last 168 hours.  CBC: Recent Labs  Lab 08/09/22 0956 08/10/22 0429  WBC 3.8* 6.7  NEUTROABS 2.3  --   HGB 15.4 15.6   HCT 47.0 46.3  MCV 90.4 88.2  PLT 227 233     Cardiac Enzymes: No results for input(s): "CKTOTAL", "CKMB", "CKMBINDEX", "TROPONINI" in the last 168 hours.  BNP: BNP (last 3 results) Recent Labs    08/09/22 1153  BNP 177.4*     ProBNP (last 3 results) No results for input(s): "PROBNP" in the last 8760 hours.    Other results:  Imaging: ECHOCARDIOGRAM COMPLETE  Result Date: 08/10/2022    ECHOCARDIOGRAM REPORT   Patient Name:   Shane Frazier. Date of Exam: 08/10/2022 Medical Rec #:  [id number]      Height:       68.0 in Accession #:    [id number]     Weight:       240.5 lb Date of Birth:  1965/06/07      BSA:          2.210 m Patient Age:    56 years       BP:           148/89 mmHg Patient Gender: M              HR:           87 bpm. Exam Location:  [facility] Procedure: 2D Echo, Cardiac Doppler and Color Doppler Indications:     Acute myocardial  Infarction, unspecified I21.9  History:         Patient has prior history of Echocardiogram examinations, most                  recent 03/26/2018.  Sonographer:     Cristela Blue Referring Phys:  [id number] CHRISTOPHER END Diagnosing Phys: Debbe Odea MD IMPRESSIONS  1. Left ventricular ejection fraction, by estimation, is 25 to 30%. The left ventricle has severely decreased function. The left ventricle demonstrates global hypokinesis. There is mild left ventricular hypertrophy. Left ventricular diastolic parameters  are consistent with Grade I diastolic dysfunction (impaired relaxation).  2. Right ventricular systolic function is low normal. The right ventricular size is normal.  3. The mitral valve is normal in structure. No evidence of mitral valve regurgitation.  4. The aortic valve is grossly normal. Aortic valve regurgitation is not visualized. FINDINGS  Left Ventricle: Left ventricular ejection fraction, by estimation, is 25 to 30%. The left ventricle has severely decreased function. The left ventricle demonstrates global hypokinesis. The left  ventricular internal cavity size was normal in size. There is mild left ventricular hypertrophy. Left ventricular diastolic parameters are consistent with Grade I diastolic dysfunction (impaired relaxation). Right Ventricle: The right ventricular size is normal. No increase in right ventricular wall thickness. Right ventricular systolic function is low normal. Left Atrium: Left atrial size was normal in size. Right Atrium: Right atrial size was normal in size. Pericardium: There is no evidence of pericardial effusion. Mitral Valve: The mitral valve is normal in structure. No evidence of mitral valve regurgitation. Tricuspid Valve: The tricuspid valve is normal in structure. Tricuspid valve regurgitation is not demonstrated. Aortic Valve: The aortic valve is grossly normal. Aortic valve regurgitation is not visualized. Aortic valve mean gradient measures 3.0 mmHg. Aortic valve peak gradient measures 4.4 mmHg. Aortic valve area, by VTI measures 2.44 cm. Pulmonic Valve: The pulmonic valve was not well visualized. Pulmonic valve regurgitation is not visualized. Aorta: The aortic root is normal in size and structure. Venous: The inferior vena cava was not well visualized. IAS/Shunts: No atrial level shunt detected by color flow Doppler.  LEFT VENTRICLE PLAX 2D LVIDd:         5.10 cm      Diastology LVIDs:         4.50 cm      LV e' medial:    4.13 cm/s LV PW:         1.70 cm      LV E/e' medial:  7.2 LV IVS:        1.40 cm      LV e' lateral:   4.57 cm/s LVOT diam:     2.45 cm      LV E/e' lateral: 6.5 LV SV:         35 LV SV Index:   16 LVOT Area:     4.71 cm  LV Volumes (MOD) LV vol d, MOD A2C: 131.0 ml LV vol d, MOD A4C: 193.0 ml LV vol s, MOD A2C: 126.0 ml LV vol s, MOD A4C: 146.0 ml LV SV MOD A2C:     5.0 ml LV SV MOD A4C:     193.0 ml LV SV MOD BP:      20.9 ml RIGHT VENTRICLE RV Basal diam:  3.30 cm RV Mid diam:    2.00 cm LEFT ATRIUM             Index        RIGHT ATRIUM  Index LA diam:        4.40 cm  1.99 cm/m   RA Area:     10.80 cm LA Vol (A2C):   49.0 ml 22.17 ml/m  RA Volume:   23.70 ml  10.72 ml/m LA Vol (A4C):   44.0 ml 19.91 ml/m LA Biplane Vol: 48.3 ml 21.85 ml/m  AORTIC VALVE AV Area (Vmax):    2.34 cm AV Area (Vmean):   2.39 cm AV Area (VTI):     2.44 cm AV Vmax:           104.45 cm/s AV Vmean:          83.200 cm/s AV VTI:            0.145 m AV Peak Grad:      4.4 mmHg AV Mean Grad:      3.0 mmHg LVOT Vmax:         51.90 cm/s LVOT Vmean:        42.200 cm/s LVOT VTI:          0.075 m LVOT/AV VTI ratio: 0.52  AORTA Ao Root diam: 3.60 cm MITRAL VALVE               TRICUSPID VALVE MV Area (PHT): 5.16 cm    TR Peak grad:   7.3 mmHg MV Decel Time: 147 msec    TR Vmax:        135.00 cm/s MV E velocity: 29.90 cm/s MV A velocity: 49.80 cm/s  SHUNTS MV E/A ratio:  0.60        Systemic VTI:  0.07 m                            Systemic Diam: 2.45 cm Debbe Odea MD Electronically signed by Debbe Odea MD Signature Date/Time: 08/10/2022/1:26:53 PM    Final      Medications:     Scheduled Medications:  ALPRAZolam  1 mg Oral Once   aspirin  81 mg Oral Daily   atorvastatin  80 mg Oral QHS   carvedilol  3.125 mg Oral BID WC   Chlorhexidine Gluconate Cloth  6 each Topical Daily   enoxaparin (LOVENOX) injection  40 mg Subcutaneous Q24H   folic acid  1 mg Oral Daily   insulin aspart  0-20 Units Subcutaneous TID WC   insulin aspart  0-5 Units Subcutaneous QHS   insulin glargine-yfgn  5 Units Subcutaneous Daily   levothyroxine  175 mcg Oral QAC breakfast   LORazepam  0-4 mg Intravenous Q12H   multivitamin with minerals  1 tablet Oral Daily   potassium chloride  40 mEq Oral Once   sacubitril-valsartan  1 tablet Oral BID   sodium chloride flush  3 mL Intravenous Q12H   spironolactone  12.5 mg Oral Daily   thiamine  100 mg Oral Daily   Or   thiamine  100 mg Intravenous Daily   ticagrelor  90 mg Oral BID    Infusions:  sodium chloride Stopped (08/09/22 1420)   sodium chloride  Stopped (08/10/22 1435)    PRN Medications: sodium chloride, acetaminophen, LORazepam **OR** LORazepam, morphine injection, nitroGLYCERIN, ondansetron (ZOFRAN) IV, sodium chloride flush   Assessment/Plan:   1. Acute anterior STEMI - Cath 08/09/22 with thrombotic occlusion of distal LAD/ There was a 50% ostial RCA lesion and Lcx was ok. EF 25-35% LVEDP 40   - s/p PCI/DES of distal LAD with 2.25x18 DES.  - Given  h/o probable underlying cardiomyopathy and flush distal occlusion of LAD, possibility of embolic event from LV entertained but I reviewed cath images with Dr. Okey Dupre and we agree that this was likely native plaque rupture - Remains CP free - Continue DAPT/statin - Refer CR   2. Acute on chronic systolic HF - Echo 2019 with EF 40-45% - Echo 08/10/22 reviewed personally EF 20-25% with high sphericity index. RV ok . - Agree that LV dysfunction is out of proportion to acute MI and likely represents longstanding NICM due to HTN or ETOH - Diuresed well with IV lasix  Ok for d/c today after cMRI. HF meds on d/c  - Continue Entresto 49/51 - Continue spiro 12.5 - Continue carvedilol 3.125 bid - Continue Farxiga 10 - Lasix 40 mg daily on a PRN basis only for swelling/weight gain    3. Severe HTN - meds as above - now much improved - needs to wear CPAP   4. ETOH abuse - discussed need for cessation  - CIWA protocol in place   5. DM2, poorly controlled - per primary - A1c 10.3 (08/09/22) - Add Farxiga  6. Hypokalemia/hypomag - supp  Length of Stay: 3  We will arrange f/u in HF Clinic  Arvilla Meres MD 08/12/2022, 8:36 AM  Advanced Heart Failure Team Pager [phone] (M-F; 7a - 4p)  Please contact [facility] for night-coverage after hours (4p -7a ) and weekends on [email or web address]

## 2022-08-12 NOTE — Progress Notes (Signed)
PHARMACY CONSULT NOTE  Pharmacy Consult for Electrolyte Monitoring and Replacement   Recent Labs: Potassium (mmol/L)  Date Value  08/12/2022 3.7   Magnesium (mg/dL)  Date Value  81/19/1478 2.3   Calcium (mg/dL)  Date Value  29/56/2130 8.8 (L)   Albumin (g/dL)  Date Value  86/57/8469 4.1   Sodium  Date Value  08/12/2022 136 mmol/L  10/17/2017 141   Assessment: 57 year old male admitted with STEMI and acute heart failure.   Goal of Therapy:  Electrolytes within normal limits  Plan:  --K 3.7, will order Kcl 40 mEq PO x 1 dose --Follow-up electrolytes with AM labs tomorrow  Tressie Ellis 08/12/2022 7:25 AM

## 2022-08-12 NOTE — Consult Note (Signed)
   Heart Failure Nurse Navigator Note  HFrEF 25 to 30%.  Left ventricular hypertrophy is mild.  Grade 1 diastolic dysfunction.  Right ventricular systolic function is low normal.  Previous echocardiogram in 2017 revealed EF to be 40 to 45%.  He presented to the emergency room with complaints of chest pain, EKG revealed anterior lateral and inferior ST elevation he was taken urgently to the cardiac catheterization lab with stent placement.  Comorbidities:  Hypertension Hyperlipidemia Hypothyroidism Depression Obstructive sleep apnea on CPAP Obesity Continued alcohol abuse  Medications:  Aspirin 81 mg daily Atorvastatin 80 mg at bedtime Carvedilol 3.125 mg 2 times a day with meals Farxiga 10 mg daily Folic acid 1 mg daily Levothyroxine 175 mcg daily Entresto 49/51 mg 2 times a day Spironolactone 12 and half milligrams daily Brilinta 90 mg 2 times a day  Labs:  Sodium 136, potassium 3.7, chloride 104, CO2 25, BUN 36, creatinine 1.27,  Weight not documented Intake 960 mL Output not documented  Initial meeting with patient and his daughter [place] who was at the bedside.  He states that he understands that the squeeze of his heart is not as strong as it should be.  Discussed the importance of daily weights and what to report also to report  along with any changes in symptoms such as increasing shortness of breath, edema, PND and orthopnea.  Patient states that he does not have a scale, so I supplied him with 1 from the heart failure clinic.  Went over the importance of low-sodium diet and why it is important with heart failure.  He voices understanding, he states that he cooks with salt but does not add it at the table. Discussed other method to cook with that do not contain sodium  Also discussed processed foods and restaurant foods.  Daughter questioned if he could use sea salt, explained to her that anything that has salt in the title is something that he should  avoid.  Made aware of fluid restriction and the reasoning behind sticking with that fluid restriction he voices understanding.  In addition to the scale, he was also given a bag for his medications along with a weekly pillbox.  Made aware that he has follow-up appointment on Monday, April 29 at 11 AM.  Explained to him where the heart failure clinic office was located.  He was given living with heart failure teaching booklet, zone magnet, info on heart failure along with low-sodium and a weight chart.  They had no further questions.  Tresa Endo RN [facility]

## 2022-08-12 NOTE — Discharge Summary (Signed)
Physician Discharge Summary  Shane Frazier. KGM:[id number] DOB: 05/05/1965 DOA: 08/09/2022  PCP: Shane Cords, DO  Admit date: 08/09/2022 Discharge date: 08/12/2022  Admitted From: Home Disposition:  Home  Recommendations for Outpatient Follow-up:  Follow up with PCP in 1-2 weeks Please obtain BMP/CBC in one week your next doctors visit.  Patient started on cardiac medications including aspirin, Brilinta, Coreg, Farxiga, Entresto, Aldactone. He will also be on Lasix as needed for weight gain/edema/shortness of breath.  Weight gain of more than 3-5 pounds Folic acid, multivitamin and thiamine prescribed. Outpatient follow-up with cardiology MRI results to be followed up outpatient ,discussed with Dr Gala Frazier.    Discharge Condition: Stable CODE STATUS: Full code Diet recommendation: Heart healthy  Brief/Interim Summary:  57 year old with history of CHF EF 45%, HTN, HLD, hypothyroidism, depression, OSA on CPAP, obesity, brain trauma related seizures 2017 not on seizure medication, migraine, alcohol use admitted for chest pain.  Patient was found to have STEMI, seen by cardiologist and underwent urgent left heart catheterization.  Patient was found to have occlusion of LAD, reduced EF.  There was successful PCI to distal LAD and patient was monitored in the ICU.  CHF team consulted for GDMT. Cardiac MRI ordered prior to patient's discharge. Results pending at the time of dc     Assessment & Plan:  Active Problems:   ST elevation myocardial infarction involving left anterior descending (LAD) coronary artery   Chronic combined systolic and diastolic heart failure due to valvular disease   Essential hypertension   Type 2 diabetes mellitus with diabetic retinopathy   HLD (hyperlipidemia)   Alcohol use   OSA on CPAP   Obesity (BMI 30-39.9)      Acute STEMI to LAD status post PCI DES  -Status post PCI on 4/23.  Continue dual antiplatelet therapy as mentioned above.   Cardiac medications as mentioned above as well.  Patient will be on Lasix as needed.  Cardiac MRI to be performed prior to discharge -LDL-181, A1c-10.3   Chronic combined systolic and diastolic heart failure due to valvular disease:  Previous EF 45%, Intracath EF down to 35%.  Echo shows EF of 35% with global hypokinesia.  CHF team following.  Cardiac MRI prior to discharge   Essential hypertension -On Coreg, Entresto, Aldactone   Type 2 diabetes mellitus with diabetic retinopathy: Uncontrolled, A1c 10.3 -On outpatient metformin and Ozempic.  Currently on sliding scale and Accu-Cheks   HLD (hyperlipidemia) -Lipitor   Alcohol use Alcohol withdrawal protocol Prescribed for thiamine and folic acid given.  Over-the-counter multivitamin Counseled to quit using alcohol   OSA  -on CPAP   Obesity (BMI 30-39.9): Body weight well 9.1 kg, BMI 36.57 -Diet and exercise   Hypokalemia/hypomagnesemia-repletion   Discharge Diagnoses:  Active Problems:   ST elevation myocardial infarction involving left anterior descending (LAD) coronary artery (HCC)   Chronic combined systolic and diastolic heart failure due to valvular disease (HCC)   Essential hypertension   Type 2 diabetes mellitus with diabetic retinopathy (HCC)   HLD (hyperlipidemia)   Alcohol use   OSA on CPAP   Obesity (BMI 30-39.9)      Consultations: Heart failure cardiology, Shane Frazier  Subjective: Feels okay no complaints  Discharge Exam: Vitals:   08/12/22 0345 08/12/22 0745  BP: 120/67 105/85  Pulse: 84 91  Resp: 18 17  Temp: (!) 97.5 F (36.4 C) 98.6 F (37 C)  SpO2: 100% 100%   Vitals:   08/11/22 2042 08/11/22 2313 08/12/22 0345  08/12/22 0745  BP: 100/72 104/75 120/67 105/85  Pulse: 88 77 84 91  Resp: 18 (!) 22 18 17   Temp: 98.3 F (36.8 C) 97.6 F (36.4 C) (!) 97.5 F (36.4 C) 98.6 F (37 C)  TempSrc:      SpO2: 98% 95% 100% 100%  Weight:      Height:        General: Pt is alert, awake,  not in acute distress Cardiovascular: RRR, S1/S2 +, no rubs, no gallops Respiratory: CTA bilaterally, no wheezing, no rhonchi Abdominal: Soft, NT, ND, bowel sounds + Extremities: no edema, no cyanosis  Discharge Instructions  Discharge Instructions     AMB Referral to Cardiac Rehabilitation - Phase II   Complete by: As directed    Diagnosis:  Coronary Stents STEMI     After initial evaluation and assessments completed: Virtual Based Care may be provided alone or in conjunction with Phase 2 Cardiac Rehab based on patient barriers.: Yes   Intensive Cardiac Rehabilitation (ICR) [facility] location only OR Traditional Cardiac Rehabilitation (TCR) *If criteria for ICR are not met will enroll in TCR [facility] only): Yes      Allergies as of 08/12/2022       Reactions   Other Other (See Comments)   BRAZILIAN NUTS ONLY-"THROAT CLOSES UP"        Medication List     STOP taking these medications    diltiazem 420 MG 24 hr capsule Commonly known as: TIAZAC   lisinopril 20 MG tablet Commonly known as: ZESTRIL   tadalafil 20 MG tablet Commonly known as: CIALIS       TAKE these medications    aspirin 81 MG chewable tablet Chew 1 tablet (81 mg total) by mouth daily. Start taking on: August 13, 2022   atorvastatin 80 MG tablet Commonly known as: LIPITOR Take 1 tablet (80 mg total) by mouth at bedtime. What changed:  medication strength how much to take   carvedilol 3.125 MG tablet Commonly known as: COREG Take 1 tablet (3.125 mg total) by mouth 2 (two) times daily with a meal.   dapagliflozin propanediol 10 MG Tabs tablet Commonly known as: FARXIGA Take 1 tablet (10 mg total) by mouth daily. Start taking on: August 13, 2022   diclofenac Sodium 1 % Gel Commonly known as: VOLTAREN Apply 2 g topically 3 (three) times daily as needed (left knee pain arthritis).   folic acid 1 MG tablet Commonly known as: FOLVITE Take 1 tablet (1 mg total) by mouth daily. Start taking on: August 13, 2022   furosemide 40 MG tablet Commonly known as: Lasix Take 1 tablet (40 mg total) by mouth daily as needed for edema or fluid.   levothyroxine 175 MCG tablet Commonly known as: SYNTHROID Take 1 tablet (175 mcg total) by mouth daily before breakfast.   metFORMIN 500 MG tablet Commonly known as: GLUCOPHAGE TAKE 1 TABLET (500 MG TOTAL) BY MOUTH 2 (TWO) TIMES DAILY WITH A MEAL.   multivitamin with minerals Tabs tablet Take 1 tablet by mouth daily. Start taking on: August 13, 2022   Ozempic (1 MG/DOSE) 4 MG/3ML Sopn Generic drug: Semaglutide (1 MG/DOSE) Inject 1 mg into the skin once a week.   sacubitril-valsartan 49-51 MG Commonly known as: ENTRESTO Take 1 tablet by mouth 2 (two) times daily.   spironolactone 25 MG tablet Commonly known as: ALDACTONE Take 0.5 tablets (12.5 mg total) by mouth daily. Start taking on: August 13, 2022   thiamine 100 MG tablet Commonly  known as: Vitamin B-1 Take 1 tablet (100 mg total) by mouth daily. Start taking on: August 13, 2022   ticagrelor 90 MG Tabs tablet Commonly known as: BRILINTA Take 1 tablet (90 mg total) by mouth 2 (two) times daily.        Follow-up Information     Shane Cords, DO Follow up in 1 week(s).   Specialty: Family Medicine Contact information: [address] [place] [address] [phone]                Allergies  Allergen Reactions   Other Other (See Comments)    BRAZILIAN NUTS ONLY-"THROAT CLOSES UP"    You were cared for by a hospitalist during your hospital stay. If you have any questions about your discharge medications or the care you received while you were in the hospital after you are discharged, you can call the unit and asked to speak with the hospitalist on call if the hospitalist that took care of you is not available. Once you are discharged, your primary care physician will handle any further medical issues. Please note that no refills for any discharge medications will  be authorized once you are discharged, as it is imperative that you return to your primary care physician (or establish a relationship with a primary care physician if you do not have one) for your aftercare needs so that they can reassess your need for medications and monitor your lab values.  You were cared for by a hospitalist during your hospital stay. If you have any questions about your discharge medications or the care you received while you were in the hospital after you are discharged, you can call the unit and asked to speak with the hospitalist on call if the hospitalist that took care of you is not available. Once you are discharged, your primary care physician will handle any further medical issues. Please note that NO REFILLS for any discharge medications will be authorized once you are discharged, as it is imperative that you return to your primary care physician (or establish a relationship with a primary care physician if you do not have one) for your aftercare needs so that they can reassess your need for medications and monitor your lab values.  Please request your Prim.MD to go over all Hospital Tests and Procedure/Radiological results at the follow up, please get all Hospital records sent to your Prim MD by signing hospital release before you go home.  Get CBC, CMP, 2 view Chest X ray checked  by Primary MD during your next visit or SNF MD in 5-7 days ( we routinely change or add medications that can affect your baseline labs and fluid status, therefore we recommend that you get the mentioned basic workup next visit with your PCP, your PCP may decide not to get them or add new tests based on their clinical decision)  On your next visit with your primary care physician please Get Medicines reviewed and adjusted.  If you experience worsening of your admission symptoms, develop shortness of breath, life threatening emergency, suicidal or homicidal thoughts you must seek medical attention  immediately by calling 911 or calling your MD immediately  if symptoms less severe.  You Must read complete instructions/literature along with all the possible adverse reactions/side effects for all the Medicines you take and that have been prescribed to you. Take any new Medicines after you have completely understood and accpet all the possible adverse reactions/side effects.   Do not drive,  operate heavy machinery, perform activities at heights, swimming or participation in water activities or provide baby sitting services if your were admitted for syncope or siezures until you have seen by Primary MD or a Neurologist and advised to do so again.  Do not drive when taking Pain medications.   Procedures/Studies: ECHOCARDIOGRAM COMPLETE  Result Date: 08/10/2022    ECHOCARDIOGRAM REPORT   Patient Name:   Shane Frazier. Date of Exam: 08/10/2022 Medical Rec #:  [id number]      Height:       68.0 in Accession #:    [id number]     Weight:       240.5 lb Date of Birth:  [birth year]      BSA:          2.210 m Patient Age:    56 years       BP:           148/89 mmHg Patient Gender: M              HR:           87 bpm. Exam Location:  [facility] Procedure: 2D Echo, Cardiac Doppler and Color Doppler Indications:     Acute myocardial Infarction, unspecified I21.9  History:         Patient has prior history of Echocardiogram examinations, most                  recent 03/26/2018.  Sonographer:     Cristela Blue Referring Phys:  [id number] END Diagnosing Phys: Debbe Odea MD IMPRESSIONS  1. Left ventricular ejection fraction, by estimation, is 25 to 30%. The left ventricle has severely decreased function. The left ventricle demonstrates global hypokinesis. There is mild left ventricular hypertrophy. Left ventricular diastolic parameters  are consistent with Grade I diastolic dysfunction (impaired relaxation).  2. Right ventricular systolic function is low normal. The right ventricular size is normal.  3. The mitral  valve is normal in structure. No evidence of mitral valve regurgitation.  4. The aortic valve is grossly normal. Aortic valve regurgitation is not visualized. FINDINGS  Left Ventricle: Left ventricular ejection fraction, by estimation, is 25 to 30%. The left ventricle has severely decreased function. The left ventricle demonstrates global hypokinesis. The left ventricular internal cavity size was normal in size. There is mild left ventricular hypertrophy. Left ventricular diastolic parameters are consistent with Grade I diastolic dysfunction (impaired relaxation). Right Ventricle: The right ventricular size is normal. No increase in right ventricular wall thickness. Right ventricular systolic function is low normal. Left Atrium: Left atrial size was normal in size. Right Atrium: Right atrial size was normal in size. Pericardium: There is no evidence of pericardial effusion. Mitral Valve: The mitral valve is normal in structure. No evidence of mitral valve regurgitation. Tricuspid Valve: The tricuspid valve is normal in structure. Tricuspid valve regurgitation is not demonstrated. Aortic Valve: The aortic valve is grossly normal. Aortic valve regurgitation is not visualized. Aortic valve mean gradient measures 3.0 mmHg. Aortic valve peak gradient measures 4.4 mmHg. Aortic valve area, by VTI measures 2.44 cm. Pulmonic Valve: The pulmonic valve was not well visualized. Pulmonic valve regurgitation is not visualized. Aorta: The aortic root is normal in size and structure. Venous: The inferior vena cava was not well visualized. IAS/Shunts: No atrial level shunt detected by color flow Doppler.  LEFT VENTRICLE PLAX 2D LVIDd:         5.10 cm      Diastology LVIDs:  4.50 cm      LV e' medial:    4.13 cm/s LV PW:         1.70 cm      LV E/e' medial:  7.2 LV IVS:        1.40 cm      LV e' lateral:   4.57 cm/s LVOT diam:     2.45 cm      LV E/e' lateral: 6.5 LV SV:         35 LV SV Index:   16 LVOT Area:     4.71 cm   LV Volumes (MOD) LV vol d, MOD A2C: 131.0 ml LV vol d, MOD A4C: 193.0 ml LV vol s, MOD A2C: 126.0 ml LV vol s, MOD A4C: 146.0 ml LV SV MOD A2C:     5.0 ml LV SV MOD A4C:     193.0 ml LV SV MOD BP:      20.9 ml RIGHT VENTRICLE RV Basal diam:  3.30 cm RV Mid diam:    2.00 cm LEFT ATRIUM             Index        RIGHT ATRIUM           Index LA diam:        4.40 cm 1.99 cm/m   RA Area:     10.80 cm LA Vol (A2C):   49.0 ml 22.17 ml/m  RA Volume:   23.70 ml  10.72 ml/m LA Vol (A4C):   44.0 ml 19.91 ml/m LA Biplane Vol: 48.3 ml 21.85 ml/m  AORTIC VALVE AV Area (Vmax):    2.34 cm AV Area (Vmean):   2.39 cm AV Area (VTI):     2.44 cm AV Vmax:           104.45 cm/s AV Vmean:          83.200 cm/s AV VTI:            0.145 m AV Peak Grad:      4.4 mmHg AV Mean Grad:      3.0 mmHg LVOT Vmax:         51.90 cm/s LVOT Vmean:        42.200 cm/s LVOT VTI:          0.075 m LVOT/AV VTI ratio: 0.52  AORTA Ao Root diam: 3.60 cm MITRAL VALVE               TRICUSPID VALVE MV Area (PHT): 5.16 cm    TR Peak grad:   7.3 mmHg MV Decel Time: 147 msec    TR Vmax:        135.00 cm/s MV E velocity: 29.90 cm/s MV A velocity: 49.80 cm/s  SHUNTS MV E/A ratio:  0.60        Systemic VTI:  0.07 m                            Systemic Diam: 2.45 cm Debbe Odea MD Electronically signed by Debbe Odea MD Signature Date/Time: 08/10/2022/1:26:53 PM    Final    DG Chest Port 1 View  Result Date: 08/09/2022 CLINICAL DATA:  Chest pain EXAM: PORTABLE CHEST 1 VIEW COMPARISON:  04/19/2018 FINDINGS: Normal cardiac and mediastinal contours given AP technique. No focal pulmonary opacity. No pleural effusion or pneumothorax. No acute osseous abnormality. IMPRESSION: No acute cardiopulmonary process. Electronically Signed   By: Wiliam Ke  M.D.   On: 08/09/2022 13:54   CARDIAC CATHETERIZATION  Result Date: 08/09/2022 Conclusions: Severe single-vessel coronary artery disease with thrombotic occlusion of the distal LAD.  There is 50% stenosis  at the ostium of the RCA, which does not appear obstructive. Severely reduced left ventricular systolic function (LVEF 25-35%) with severely elevated filling pressure (LVEDP 40 mmHg).  Degree of LVEF is out of proportion to CAD, suggestive of predominantly nonischemic cardiomyopathy. Successful PCI to distal LAD occlusion using Onyx Frontier 2.25 x 18 mm drug-eluting stent with 0% residual stenosis and TIMI-3 flow. Recommendations: Dual antiplatelet therapy with aspirin and ticagrelor for at least 12 months. Obtain echocardiogram to reassess LVEF and exclude LV thrombus in the setting of distal LAD occlusion and severely reduced LVEF. Continue diuresis and judicious escalation of goal-directed medical therapy as tolerated. Discontinue cangrelor infusion 2 hours after ticagrelor load was given (infusion to end at 1 PM). Yvonne Kendall, MD [facility]    The results of significant diagnostics from this hospitalization (including imaging, microbiology, ancillary and laboratory) are listed below for reference.     Microbiology: Recent Results (from the past 240 hour(s))  MRSA Next Gen by PCR, Nasal     Status: None   Collection Time: 08/09/22 11:35 AM   Specimen: Nasal Mucosa; Nasal Swab  Result Value Ref Range Status   MRSA by PCR Next Gen NOT DETECTED NOT DETECTED Final    Comment: (NOTE) The GeneXpert MRSA Assay (FDA approved for NASAL specimens only), is one component of a comprehensive MRSA colonization surveillance program. It is not intended to diagnose MRSA infection nor to guide or monitor treatment for MRSA infections. Test performance is not FDA approved in patients less than 76 years old. Performed at [facility], [address] [place], [place] [address]      Labs: BNP (last 3 results) Recent Labs    08/09/22 1153  BNP 177.4*   Basic Metabolic Panel: Recent Labs  Lab 08/09/22 0956 08/10/22 0429 08/11/22 0541 08/12/22 0335  NA 139 135 134* 136  K 3.6  3.2* 3.4* 3.7  CL 103 99 99 104  CO2 27 24 24 25   GLUCOSE 222* 182* 123* 170*  BUN 17 17 31* 36*  CREATININE 1.25* 1.23 1.39* 1.27*  CALCIUM 9.7 9.4 8.9 8.8*  MG  --  1.6* 2.0 2.3   Liver Function Tests: Recent Labs  Lab 08/09/22 0956  AST 22  ALT 19  ALKPHOS 62  BILITOT 0.5  PROT 8.0  ALBUMIN 4.1   No results for input(s): "LIPASE", "AMYLASE" in the last 168 hours. No results for input(s): "AMMONIA" in the last 168 hours. CBC: Recent Labs  Lab 08/09/22 0956 08/10/22 0429  WBC 3.8* 6.7  NEUTROABS 2.3  --   HGB 15.4 15.6  HCT 47.0 46.3  MCV 90.4 88.2  PLT 227 233   Cardiac Enzymes: No results for input(s): "CKTOTAL", "CKMB", "CKMBINDEX", "TROPONINI" in the last 168 hours. BNP: Invalid input(s): "POCBNP" CBG: Recent Labs  Lab 08/11/22 0748 08/11/22 1135 08/11/22 1650 08/11/22 2042 08/12/22 0729  GLUCAP 132* 288* 136* 194* 191*   D-Dimer No results for input(s): "DDIMER" in the last 72 hours. Hgb A1c No results for input(s): "HGBA1C" in the last 72 hours. Lipid Profile No results for input(s): "CHOL", "HDL", "LDLCALC", "TRIG", "CHOLHDL", "LDLDIRECT" in the last 72 hours. Thyroid function studies No results for input(s): "TSH", "T4TOTAL", "T3FREE", "THYROIDAB" in the last 72 hours.  Invalid input(s): "FREET3" Anemia work up No results for  input(s): "VITAMINB12", "FOLATE", "FERRITIN", "TIBC", "IRON", "RETICCTPCT" in the last 72 hours. Urinalysis    Component Value Date/Time   COLORURINE STRAW (A) 10/01/2017 1214   APPEARANCEUR CLEAR (A) 10/01/2017 1214   LABSPEC 1.013 10/01/2017 1214   PHURINE 7.0 10/01/2017 1214   GLUCOSEU 150 (A) 10/01/2017 1214   HGBUR NEGATIVE 10/01/2017 1214   BILIRUBINUR negative 09/11/2019 1013   KETONESUR NEGATIVE 10/01/2017 1214   PROTEINUR Negative 09/11/2019 1013   PROTEINUR 30 (A) 10/01/2017 1214   UROBILINOGEN 0.2 09/11/2019 1013   NITRITE negative 09/11/2019 1013   NITRITE NEGATIVE 10/01/2017 1214   LEUKOCYTESUR  Negative 09/11/2019 1013   Sepsis Labs Recent Labs  Lab 08/09/22 0956 08/10/22 0429  WBC 3.8* 6.7   Microbiology Recent Results (from the past 240 hour(s))  MRSA Next Gen by PCR, Nasal     Status: None   Collection Time: 08/09/22 11:35 AM   Specimen: Nasal Mucosa; Nasal Swab  Result Value Ref Range Status   MRSA by PCR Next Gen NOT DETECTED NOT DETECTED Final    Comment: (NOTE) The GeneXpert MRSA Assay (FDA approved for NASAL specimens only), is one component of a comprehensive MRSA colonization surveillance program. It is not intended to diagnose MRSA infection nor to guide or monitor treatment for MRSA infections. Test performance is not FDA approved in patients less than 23 years old. Performed at [facility], [address] [place], [place] [address]      Time coordinating discharge:  I have spent 35 minutes face to face with the patient and on the ward discussing the patients care, assessment, plan and disposition with other care givers. >50% of the time was devoted counseling the patient about the risks and benefits of treatment/Discharge disposition and coordinating care.   SIGNED:   Dimple Nanas, MD  [facility] 08/12/2022, 10:55 AM   If 7PM-7AM, please contact night-coverage

## 2022-08-15 ENCOUNTER — Telehealth: Payer: Self-pay

## 2022-08-15 ENCOUNTER — Ambulatory Visit (HOSPITAL_BASED_OUTPATIENT_CLINIC_OR_DEPARTMENT_OTHER): Payer: 59 | Admitting: Cardiology

## 2022-08-15 ENCOUNTER — Other Ambulatory Visit
Admission: RE | Admit: 2022-08-15 | Discharge: 2022-08-15 | Disposition: A | Discharge status: Home / Self Care | Payer: 59 | Source: Ambulatory Visit | Attending: Cardiology | Admitting: Cardiology

## 2022-08-15 ENCOUNTER — Other Ambulatory Visit: Payer: Self-pay

## 2022-08-15 ENCOUNTER — Encounter: Payer: Self-pay | Admitting: Cardiology

## 2022-08-15 VITALS — BP 124/80 | HR 92 | Wt 245.2 lb

## 2022-08-15 DIAGNOSIS — I2510 Atherosclerotic heart disease of native coronary artery without angina pectoris: Secondary | ICD-10-CM

## 2022-08-15 DIAGNOSIS — I2102 ST elevation (STEMI) myocardial infarction involving left anterior descending coronary artery: Secondary | ICD-10-CM | POA: Diagnosis not present

## 2022-08-15 DIAGNOSIS — E669 Obesity, unspecified: Secondary | ICD-10-CM

## 2022-08-15 DIAGNOSIS — I10 Essential (primary) hypertension: Secondary | ICD-10-CM

## 2022-08-15 DIAGNOSIS — I5022 Chronic systolic (congestive) heart failure: Secondary | ICD-10-CM

## 2022-08-15 LAB — BASIC METABOLIC PANEL
Anion gap: 7 (ref 5–15)
BUN: 32 mg/dL — ABNORMAL HIGH (ref 6–20)
CO2: 27 mmol/L (ref 22–32)
Calcium: 9.2 mg/dL (ref 8.9–10.3)
Chloride: 101 mmol/L (ref 98–111)
Creatinine, Ser: 1.54 mg/dL — ABNORMAL HIGH (ref 0.61–1.24)
GFR, Estimated: 53 mL/min — ABNORMAL LOW (ref >60)
Glucose, Bld: 332 mg/dL — ABNORMAL HIGH (ref 70–99)
Potassium: 4.7 mmol/L (ref 3.5–5.1)
Sodium: 135 mmol/L (ref 135–145)

## 2022-08-15 LAB — BRAIN NATRIURETIC PEPTIDE: B Natriuretic Peptide: 97.9 pg/mL (ref 0.0–100.0)

## 2022-08-15 MED ORDER — ENTRESTO 97-103 MG PO TABS
1.0000 | ORAL_TABLET | Freq: Two times a day (BID) | ORAL | 3 refills | Status: DC
  Filled 2022-08-15: qty 60, 30d supply, fill #0

## 2022-08-15 MED ORDER — CARVEDILOL 6.25 MG PO TABS
6.2500 mg | ORAL_TABLET | Freq: Two times a day (BID) | ORAL | 3 refills | Status: DC
  Filled 2022-08-15: qty 180, 90d supply, fill #0

## 2022-08-15 NOTE — Patient Instructions (Signed)
Medication Changes:  INCREASE Entresto to 97/103 mg tablet twice daily INCREASE Carvedilol (Coreg) 6.25 mg tablet twice daily  Take Furosemide 40 mg tablet once daily for the next 3 days then resume taking only as needed  Lab Work:  Labs done today, your results will be available in MyChart, we will contact you for abnormal readings.   Special Instructions // Education:  Do the following things EVERYDAY: Weigh yourself in the morning before breakfast. Write it down and keep it in a log. Take your medicines as prescribed Eat low salt foods--Limit salt (sodium) to 2000 mg per day.  Stay as active as you can everyday Limit all fluids for the day to less than 2 liters   Follow-Up in: 1 month with Dr. Gala Romney    If you have any questions or concerns before your next appointment please send [place] a message through mychart or call our office at [phone] Monday-Friday 8 am-5 pm.   If you have an urgent need after hours on the weekend please call your Primary Cardiologist or the Advanced Heart Failure Clinic in [place] at [phone].

## 2022-08-15 NOTE — Progress Notes (Signed)
ADVANCED HEART FAILURE CLINIC NOTE  Referring Physician: Saralyn Pilar *  Primary Care: Smitty Cords, DO Primary Cardiologist: Dr. Okey Dupre HF: Dr. Gala Romney  HPI: Shane Frazier. 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  Shane Frazier cardiac history dates back to 2019 when preoperative cardiovascular risk assessment by Dr. Mariah Milling was notable for an LVEF of 40 to 45% during presentation for multilobar pneumonia.  Unfortunately he did not follow-up after that time.  In April 2024 he presented with acute chest pain with EKG demonstrating anterior lateral and inferior ST elevations.  He had a left heart cath that demonstrated thrombotic occlusion of the distal LAD with 50% ostial RCA stenosis; he underwent PCI to the distal LAD.  At that time he was also drinking 1/5 of liquor almost daily.  Echocardiogram during admission demonstrated an EF of 20 to 25% with high sphericity index.  Cardiac MRI during admission with a EF of 23%, severely reduced RV function and transmural LGE in the apex with a mildly elevated ECV of 39%.  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 He does about 15-20 minutes of physical labor including raking, etc. He intermittently gets short of breath but this resolves quickly with short breaks; no chest pain or lightheadedness since PCI last week.   Activity level/exercise tolerance:  NYHA IIB Orthopnea:  Sleeps on 2-3 pillows Paroxysmal noctural dyspnea:  No Chest pain/pressure:  No Orthostatic lightheadedness:  No Palpitations:  No Lower extremity edema:  1+ Presyncope/syncope:  No Cough:  No  Past Medical History:  Diagnosis Date   CHF (congestive heart  failure), NYHA class I, acute on chronic, systolic (HCC)    Concussion 2017   cause of seizure after hit head at work   Difficult intubation    patient unaware of this diagnosis   ED (erectile dysfunction)    Hypothyroidism    Seizures (HCC) 05-20-15   X 1-PT HIT HIS HEAD AT WORK AND THEN HAD A SEIZURE    Current Outpatient Medications  Medication Sig Dispense Refill   aspirin 81 MG chewable tablet Chew 1 tablet (81 mg total) by mouth daily. 30 tablet 0   atorvastatin (LIPITOR) 80 MG tablet Take 1 tablet (80 mg total) by mouth at bedtime. 30 tablet 0   carvedilol (COREG) 3.125 MG tablet Take 1 tablet (3.125 mg total) by mouth 2 (two) times daily with a meal. 60 tablet 0   dapagliflozin propanediol (FARXIGA) 10 MG TABS tablet Take 1 tablet (10 mg total) by mouth daily. 30 tablet 0   diclofenac Sodium (VOLTAREN) 1 % GEL Apply 2 g topically 3 (three) times daily as needed (left knee pain arthritis). (Patient not taking: Reported on 03/15/2021) 100 g 2   folic acid (FOLVITE) 1 MG tablet Take 1 tablet (1 mg total) by mouth daily. 30 tablet 0   furosemide (LASIX) 40 MG tablet Take 1 tablet (40 mg total) by mouth daily as needed for edema or fluid. 30 tablet 0   levothyroxine (SYNTHROID) 175 MCG tablet Take 1 tablet (175 mcg total) by mouth daily before breakfast. (Patient not taking: Reported on 08/10/2022) 90 tablet 3   metFORMIN (GLUCOPHAGE) 500 MG tablet TAKE 1  TABLET (500 MG TOTAL) BY MOUTH 2 (TWO) TIMES DAILY WITH A MEAL. (Patient not taking: Reported on 08/10/2022) 180 tablet 3   Multiple Vitamin (MULTIVITAMIN WITH MINERALS) TABS tablet Take 1 tablet by mouth daily.     OZEMPIC, 1 MG/DOSE, 4 MG/3ML SOPN Inject 1 mg into the skin once a week. (Patient not taking: Reported on 08/10/2022) 3 mL 5   sacubitril-valsartan (ENTRESTO) 49-51 MG Take 1 tablet by mouth 2 (two) times daily. 60 tablet 0   spironolactone (ALDACTONE) 25 MG tablet Take 0.5 tablets (12.5 mg total) by mouth daily. 30 tablet 0    thiamine (VITAMIN B-1) 100 MG tablet Take 1 tablet (100 mg total) by mouth daily. 30 tablet 0   ticagrelor (BRILINTA) 90 MG TABS tablet Take 1 tablet (90 mg total) by mouth 2 (two) times daily. 60 tablet 0   No current facility-administered medications for this visit.    Allergies  Allergen Reactions   Other Other (See Comments)    BRAZILIAN NUTS ONLY-"THROAT CLOSES UP"      Social History   Socioeconomic History   Marital status: Married    Spouse name: Not on file   Number of children: Not on file   Years of education: Not on file   Highest education level: Not on file  Occupational History   Not on file  Tobacco Use   Smoking status: Never   Smokeless tobacco: Current    Types: Chew  Vaping Use   Vaping Use: Never used  Substance and Sexual Activity   Alcohol use: Not Currently    Alcohol/week: 8.0 standard drinks of alcohol    Types: 8 Shots of liquor per week    Comment: occasionally. rarely   Drug use: No   Sexual activity: Yes  Other Topics Concern   Not on file  Social History Narrative   Not on file   Social Determinants of Health   Financial Resource Strain: Not on file  Food Insecurity: Food Insecurity Present (08/10/2022)   Hunger Vital Sign    Worried About Running Out of Food in the Last Year: Sometimes true    Ran Out of Food in the Last Year: Sometimes true  Transportation Needs: Unmet Transportation Needs (08/10/2022)   PRAPARE - Administrator, Civil Service (Medical): Yes    Lack of Transportation (Non-Medical): Patient unable to answer  Physical Activity: Not on file  Stress: Not on file  Social Connections: Not on file  Intimate Partner Violence: Not At Risk (08/10/2022)   Humiliation, Afraid, Rape, and Kick questionnaire    Fear of Current or Ex-Partner: No    Emotionally Abused: No    Physically Abused: No    Sexually Abused: No      Family History  Problem Relation Age of Onset   Hypertension Mother    Cancer Father     Diabetes Sister    Thyroid cancer Sister     PHYSICAL EXAM: Vitals:   08/15/22 1102  BP: 124/80  Pulse: 92  SpO2: 99%   GENERAL: Well nourished, well developed, and in no apparent distress at rest.  HEENT: Negative for arcus senilis or xanthelasma. There is no scleral icterus.  The mucous membranes are pink and moist.   NECK: Supple, No masses. Normal carotid upstrokes without bruits. No masses or thyromegaly.    CHEST: There are no chest wall deformities. There is no chest wall tenderness. Respirations are unlabored.  Lungs- CTA B/L CARDIAC:  JVP: 9 cm  H2O         Normal S1, S2  Normal rate with regular rhythm. No murmurs, rubs or gallops.  Pulses are 2+ and symmetrical in upper and lower extremities. 1+ edema.  ABDOMEN: Soft, non-tender, non-distended. There are no masses or hepatomegaly. There are normal bowel sounds.  EXTREMITIES: Warm and well perfused with no cyanosis, clubbing.  LYMPHATIC: No axillary or supraclavicular lymphadenopathy.  NEUROLOGIC: Patient is oriented x3 with no focal or lateralizing neurologic deficits.  PSYCH: Patients affect is appropriate, there is no evidence of anxiety or depression.  SKIN: Warm and dry; no lesions or wounds.   DATA REVIEW  ECG: 08/10/22: NSR  as per my personal interpretation  ECHO: 08/10/22: LVEF 20-25%, moderate LVH, preserved RV function as per my personal interpretation  CATH: 08/09/22: Severe single-vessel coronary artery disease with thrombotic occlusion of the distal LAD.  There is 50% stenosis at the ostium of the RCA, which does not appear obstructive. Severely reduced left ventricular systolic function (LVEF 25-35%) with severely elevated filling pressure (LVEDP 40 mmHg).  Degree of LVEF is out of proportion to CAD, suggestive of predominantly nonischemic cardiomyopathy. Successful PCI to distal LAD occlusion using Onyx Frontier 2.25 x 18 mm drug-eluting stent with 0% residual stenosis and TIMI-3 flow.  CMR:   08/12/22: 1. Mildly dilated LV, severely reduced LV systolic function. LVEF 23%.  2. Near transmural apical LGE/scar.  3. LV septal midwall LGE at RV insertion sites.  4. Severely reduced RV function.  5. Findings suggest prior LAD infarct.  6. Mixed ischemic and non-ischemic cardiomyopathy.  ASSESSMENT & PLAN:  Heart failure with reduced EF Etiology of OZ:HYQMV ischemic and nonischemic cardiomyopathy. LAD thrombotic occlusion in 4/24 with transmural LGE in the apex by CMR. This is likely exacerbated by hx of uncontrolled HTN and alcohol abuse.  NYHA class / AHA Stage:IIB Volume status & Diuretics: Mildly hypervolemic on exam, taking lasix 40mg  PRN. REDS 39%. Will take once daily for the next 3 days then PRN.  Vasodilators: Increase Entresto 97/103mg  BID Beta-Blocker:increase coreg to 6.25mg  BID HQI:ONGEXBMW to 25mg  at follow up.  Cardiometabolic:farxiga 10mg  daily Devices therapies & Valvulopathies:Not currently indicated; QRSD < , may require primary prevention ICD in the future Advanced therapies:Not currently indicated.   2. HTN - Well controlled today. See above.   3. T2DM  - A1C 10.3 on 08/09/22  4. Obesity -Body mass index is 37.28 kg/m. -discussed importance of weight loss and dietary restrictions.   Shane Frazier Advanced Heart Failure Mechanical Circulatory Support

## 2022-08-15 NOTE — Transitions of Care (Post Inpatient/ED Visit) (Signed)
   08/15/2022  Name: Shane Frazier. MRN: [id number] DOB: 11/04/1965  Today's TOC FU Call Status: Today's TOC FU Call Status:: Successful TOC FU Call Competed TOC FU Call Complete Date: 08/15/22  Transition Care Management Follow-up Telephone Call Date of Discharge: 08/12/22 Discharge Facility: [facility]) Type of Discharge: Inpatient Admission Primary Inpatient Discharge Diagnosis:: ST elevation myocardial infarction involving left anterior descending (LAD) How have you been since you were released from the hospital?: Better Any questions or concerns?: No  Items Reviewed: Did you receive and understand the discharge instructions provided?: Yes Medications obtained and verified?: Yes (Medications Reviewed) Any new allergies since your discharge?: No Dietary orders reviewed?: NA Do you have support at home?: Yes  Home Care and Equipment/Supplies: Were Home Health Services Ordered?: NA Any new equipment or medical supplies ordered?: NA  Functional Questionnaire: Do you need assistance with bathing/showering or dressing?: No Do you need assistance with meal preparation?: No Do you need assistance with eating?: No Do you have difficulty maintaining continence: No Do you need assistance with getting out of bed/getting out of a chair/moving?: No Do you have difficulty managing or taking your medications?: No  Follow up appointments reviewed: PCP Follow-up appointment confirmed?: Yes Date of PCP follow-up appointment?: 08/19/22 Follow-up Provider: Dr. Althea Charon Specialist [facility] Follow-up appointment confirmed?: NA Do you need transportation to your follow-up appointment?: No Do you understand care options if your condition(s) worsen?: Yes-patient verbalized understanding    SIGNATURE  Agnes Lawrence, CMA (AAMA)  [facility] [phone]

## 2022-08-15 NOTE — Progress Notes (Signed)
   08/15/22 1121  ReDS Vest / Clip  Station Marker D  Ruler Value 34  ReDS Value Range 36 - 40  ReDS Actual Value 39

## 2022-08-19 ENCOUNTER — Encounter: Payer: Self-pay | Admitting: Family Medicine

## 2022-08-19 ENCOUNTER — Ambulatory Visit (INDEPENDENT_AMBULATORY_CARE_PROVIDER_SITE_OTHER): Payer: 59 | Admitting: Family Medicine

## 2022-08-19 ENCOUNTER — Other Ambulatory Visit: Payer: Self-pay

## 2022-08-19 VITALS — BP 108/70 | HR 97 | Ht 68.0 in | Wt 239.0 lb

## 2022-08-19 DIAGNOSIS — I252 Old myocardial infarction: Secondary | ICD-10-CM

## 2022-08-19 DIAGNOSIS — I10 Essential (primary) hypertension: Secondary | ICD-10-CM | POA: Diagnosis not present

## 2022-08-19 DIAGNOSIS — I5042 Chronic combined systolic (congestive) and diastolic (congestive) heart failure: Secondary | ICD-10-CM | POA: Diagnosis not present

## 2022-08-19 DIAGNOSIS — E113293 Type 2 diabetes mellitus with mild nonproliferative diabetic retinopathy without macular edema, bilateral: Secondary | ICD-10-CM

## 2022-08-19 DIAGNOSIS — F1020 Alcohol dependence, uncomplicated: Secondary | ICD-10-CM

## 2022-08-19 DIAGNOSIS — I38 Endocarditis, valve unspecified: Secondary | ICD-10-CM

## 2022-08-19 NOTE — Progress Notes (Signed)
Subjective:    Patient ID: Shane Frazier., male    DOB: 03-Mar-1966, 57 y.o.   MRN: [id number]  Shane Frazier. is a 57 y.o. male presenting on 08/19/2022 for Hospitalization Follow-up   HPI  HOSPITAL FOLLOW-UP VISIT  Hospital/Location: [facility] Date of Admission: 08/09/22 Date of Discharge: 08/12/22 Transitions of care telephone call: Completed 08/15/22 completed by Agnes Lawrence CMA  Reason for Admission: STEMI  [facility] H&P and Discharge Summary have been reviewed - Patient presents today 7 days after recent hospitalization. Brief summary of recent course, patient had symptoms of chest pain, hospitalized with MI. Followed by Cardiology. Has had reduced LVEF on ECHO. Scheduled w/ CHF Clinic and medications were adjusted. HisiA1c was checked >10 and uncontrolled. He is off medication Ozempic. Ran out. Issues with insurance coverage.  He forgets Metformin more than 1x daily, taking Metformin 500mg  IR old rx taking 1-2 per day. He is interested to change to XR in future  Take Furosemide 40 mg tablet once daily for the next 3 days then resume taking only as needed  Regarding Alcohol, He drinks 2-4 times per month. He is more active working, and not drinking while working.  - Today reports overall has he done well after discharge. Symptoms of chest pain have resolved  No significant swelling. He has lost weight on diuretics.  - New medications on discharge: Aspirin, Brilinta, Coreg, Farxiga, Entresto, Aldactone. Also on Lasix for as needed. - Changes to current meds on discharge:   Cardiology apt on 08/15/22   INCREASE Entresto to 97/103 mg tablet twice daily INCREASE Carvedilol (Coreg) 6.25 mg tablet twice daily  I have reviewed the discharge medication list, and have reconciled the current and discharge medications today.   Current Outpatient Medications:    aspirin 81 MG chewable tablet, Chew 1 tablet (81 mg total) by mouth daily., Disp: 30 tablet, Rfl: 0   atorvastatin  (LIPITOR) 80 MG tablet, Take 1 tablet (80 mg total) by mouth at bedtime., Disp: 30 tablet, Rfl: 0   carvedilol (COREG) 3.125 MG tablet, Take 3.125 mg by mouth 2 (two) times daily with a meal., Disp: , Rfl:    dapagliflozin propanediol (FARXIGA) 10 MG TABS tablet, Take 1 tablet (10 mg total) by mouth daily., Disp: 30 tablet, Rfl: 0   folic acid (FOLVITE) 1 MG tablet, Take 1 tablet (1 mg total) by mouth daily., Disp: 30 tablet, Rfl: 0   furosemide (LASIX) 40 MG tablet, Take 1 tablet (40 mg total) by mouth daily as needed for edema or fluid., Disp: 30 tablet, Rfl: 0   levothyroxine (SYNTHROID) 175 MCG tablet, Take 1 tablet (175 mcg total) by mouth daily before breakfast., Disp: 90 tablet, Rfl: 3   metFORMIN (GLUCOPHAGE) 500 MG tablet, TAKE 1 TABLET (500 MG TOTAL) BY MOUTH 2 (TWO) TIMES DAILY WITH A MEAL., Disp: 180 tablet, Rfl: 3   Multiple Vitamin (MULTIVITAMIN WITH MINERALS) TABS tablet, Take 1 tablet by mouth daily., Disp: , Rfl:    sacubitril-valsartan (ENTRESTO) 97-103 MG, Take 1 tablet by mouth 2 (two) times daily., Disp: 60 tablet, Rfl: 3   spironolactone (ALDACTONE) 25 MG tablet, Take 0.5 tablets (12.5 mg total) by mouth daily., Disp: 30 tablet, Rfl: 0   thiamine (VITAMIN B-1) 100 MG tablet, Take 1 tablet (100 mg total) by mouth daily., Disp: 30 tablet, Rfl: 0   ticagrelor (BRILINTA) 90 MG TABS tablet, Take 1 tablet (90 mg total) by mouth 2 (two) times daily., Disp: 60 tablet, Rfl:  0  ------------------------------------------------------------------------- Social History   Tobacco Use   Smoking status: Never   Smokeless tobacco: Current    Types: Chew  Vaping Use   Vaping Use: Never used  Substance Use Topics   Alcohol use: Not Currently    Alcohol/week: 8.0 standard drinks of alcohol    Types: 8 Shots of liquor per week    Comment: occasionally. rarely   Drug use: No    Review of Systems Per HPI unless specifically indicated above     Objective:    BP 108/70   Pulse 97    Ht 5\' 8"  (1.727 m)   Wt 239 lb (108.4 kg)   SpO2 100%   BMI 36.34 kg/m   Wt Readings from Last 3 Encounters:  08/19/22 239 lb (108.4 kg)  08/15/22 245 lb 3.2 oz (111.2 kg)  08/09/22 240 lb 8.4 oz (109.1 kg)    Physical Exam Vitals and nursing note reviewed.  Constitutional:      General: He is not in acute distress.    Appearance: He is well-developed. He is not diaphoretic.     Comments: Well-appearing, comfortable, cooperative  HENT:     Head: Normocephalic and atraumatic.  Eyes:     General:        Right eye: No discharge.        Left eye: No discharge.     Conjunctiva/sclera: Conjunctivae normal.  Neck:     Thyroid: No thyromegaly.  Cardiovascular:     Rate and Rhythm: Normal rate and regular rhythm.     Pulses: Normal pulses.     Heart sounds: Normal heart sounds. No murmur heard. Pulmonary:     Effort: Pulmonary effort is normal. No respiratory distress.     Breath sounds: Normal breath sounds. No wheezing or rales.  Musculoskeletal:        General: Normal range of motion.     Cervical back: Normal range of motion and neck supple.     Right lower leg: No edema.     Left lower leg: No edema.  Lymphadenopathy:     Cervical: No cervical adenopathy.  Skin:    General: Skin is warm and dry.     Findings: No erythema or rash.  Neurological:     Mental Status: He is alert and oriented to person, place, and time. Mental status is at baseline.  Psychiatric:        Behavior: Behavior normal.     Comments: Well groomed, good eye contact, normal speech and thoughts       Results for orders placed or performed during the hospital encounter of 08/15/22  B Nat Peptide  Result Value Ref Range   B Natriuretic Peptide 97.9 0.0 - 100.0 pg/mL  Basic Metabolic Panel (BMET)  Result Value Ref Range   Sodium 135 135 - 145 mmol/L   Potassium 4.7 3.5 - 5.1 mmol/L   Chloride 101 98 - 111 mmol/L   CO2 27 22 - 32 mmol/L   Glucose, Bld 332 (H) 70 - 99 mg/dL   BUN 32 (H) 6 - 20  mg/dL   Creatinine, Ser 1.61 (H) 0.61 - 1.24 mg/dL   Calcium 9.2 8.9 - 09.6 mg/dL   GFR, Estimated 53 (L) >60 mL/min   Anion gap 7 5 - 15      Assessment & Plan:   Problem List Items Addressed This Visit     Chronic combined systolic and diastolic heart failure due to valvular disease (HCC) -  Primary   Relevant Medications   carvedilol (COREG) 3.125 MG tablet   Essential hypertension   Relevant Medications   carvedilol (COREG) 3.125 MG tablet   History of ST elevation myocardial infarction (STEMI)   Morbid obesity (HCC)   Type 2 diabetes mellitus with diabetic retinopathy (HCC)   Uncomplicated alcohol dependence (HCC)   HFU NSTEMI Followed by Cardiology and has upcoming visit with [facility] in 1 month Medication management has been updated, and at outpatient visit doses adjusted He has lost 6 lbs in 1 week on diuretics. No current edema or volume overload Continue current therapy for med management of CAD CHF per cardiology  A1c remains uncontrolled. Checked in hospital >10 He is off medication Ozempic  Will try to coordinate with him and his insurance to work on coverage gap. Will contact our clinical pharmacy / CCM referral if able.  We can change Metformin to XR since he has adherence issue to IR   Recent hospitalization with NSTEMI Heart attack and worsening heart failure, uncontrolled diabetes. Also mental health concerns. He has had insurance coverage issues following separation from wife as he was on her insurance prior. He could not get the ozempic. Needs assistance coordinating his insurance coverage, rx management, and follow-up now with specialists after hospitalization.   Insurance coverage   No orders of the defined types were placed in this encounter.   Follow up plan: Return in about 3 months (around 11/19/2022) for 3 month DM A1c, HTN, Cards CHF updates.   Saralyn Pilar, DO [facility] 08/19/2022, 10:30 AM

## 2022-08-19 NOTE — Patient Instructions (Addendum)
Thank you for coming to the office today.  I have referred you to our Chronic Care Management Team  Nurse [facility] Pharmacist Gentry Fitz Social Worker Chrystal  They will contact you to help make sure you are all organized with insurance, medications, follow up apts and anything you need.  Hopefully we can get you back on track with Ozempic or other version of medication.  Keep on current mends.  We will need to coordinate meds ordered from [facility] Discharge.  Keep apt with Dr Gala Romney 09/20/22  Please schedule a Follow-up Appointment to: Return in about 3 months (around 11/19/2022) for 3 month DM A1c, HTN, Cards CHF updates.  If you have any other questions or concerns, please feel free to call the office or send a message through MyChart. You may also schedule an earlier appointment if necessary.  Additionally, you may be receiving a survey about your experience at our office within a few days to 1 week by e-mail or mail. We value your feedback.  Saralyn Pilar, DO [facility], [place]

## 2022-08-22 ENCOUNTER — Other Ambulatory Visit: Payer: Self-pay | Admitting: Family Medicine

## 2022-08-22 DIAGNOSIS — I38 Endocarditis, valve unspecified: Secondary | ICD-10-CM

## 2022-08-22 DIAGNOSIS — I252 Old myocardial infarction: Secondary | ICD-10-CM

## 2022-08-22 DIAGNOSIS — I10 Essential (primary) hypertension: Secondary | ICD-10-CM

## 2022-08-22 DIAGNOSIS — F1020 Alcohol dependence, uncomplicated: Secondary | ICD-10-CM

## 2022-08-22 DIAGNOSIS — E113293 Type 2 diabetes mellitus with mild nonproliferative diabetic retinopathy without macular edema, bilateral: Secondary | ICD-10-CM

## 2022-08-23 ENCOUNTER — Telehealth: Payer: Self-pay

## 2022-08-23 NOTE — Progress Notes (Signed)
  Care Management   Outreach Note  08/23/2022 Name: Shane Frazier. MRN: [id number] DOB: Mar 11, 1966  An unsuccessful telephone outreach was attempted today to contact the patient about Chronic Care Management needs.    Follow Up Plan:  A HIPAA compliant phone message was left for the patient providing contact information and requesting a return call.  The care management team will reach out to the patient again over the next 7 days.  If patient returns call to provider office, please advise to call Embedded Care Management Care Guide Penne Lash * at [phone] Lash, RMA Care Guide [facility]  [place], [place] [address] Direct Dial: [phone] [email or web address]

## 2022-08-29 NOTE — Progress Notes (Signed)
  Care Management   Outreach Note  08/29/2022 Name: Shane Frazier. MRN: [id number] DOB: 04-01-1966  Second unsuccessful telephone outreach was attempted today to contact the patient about Chronic Care Management needs.    Follow Up Plan:  A HIPAA compliant phone message was left for the patient providing contact information and requesting a return call.  The care management team will reach out to the patient again over the next 7 days.  If patient returns call to provider office, please advise to call Embedded Care Management Care Guide Penne Lash * at [phone] Lash, RMA Care Guide [facility]  [place], [place] [address] Direct Dial: [phone] [email or web address]

## 2022-08-30 ENCOUNTER — Other Ambulatory Visit: Payer: Self-pay

## 2022-09-06 NOTE — Progress Notes (Signed)
  Chronic Care Management   Note  09/06/2022 Name: Shane Frazier. MRN: [id number] DOB: 12-13-1965  Shane Frazier. is a 57 y.o. year old male who is a primary care patient of Smitty Cords, DO. I reached out to [name]. by phone today in response to a referral sent by [name] PCP.  Shane Frazier was given information about Chronic Care Management services today including:  CCM service includes personalized support from designated clinical staff supervised by the physician, including individualized plan of care and coordination with other care providers 24/7 contact phone numbers for assistance for urgent and routine care needs. Service will only be billed when office clinical staff spend 20 minutes or more in a month to coordinate care. Only one practitioner may furnish and bill the service in a calendar month. The patient may stop CCM services at amy time (effective at the end of the month) by phone call to the office staff. The patient will be responsible for cost sharing (co-pay) or up to 20% of the service fee (after annual deductible is met)  [name].  agreedto scheduling an appointment with the CCM RN Case Manager   Follow up plan: Patient agreed to scheduled appointment with RN Case Manager on 09/19/2022 Pharm d 10/03/2022(date/time).   Penne Lash, RMA Care Guide [facility]  [place], [place] [address] Direct Dial: [phone] [name]

## 2022-09-13 ENCOUNTER — Telehealth: Payer: Self-pay | Admitting: Family Medicine

## 2022-09-13 NOTE — Telephone Encounter (Signed)
He will need an apt for Disability Forms.  We received a packet of forms for Disability Application.  If he is requesting completion of these forms, he will need an office visit with me to specifically go over the disability forms.  Keep in mind, we do not determine his disability, this is likely to help with his disability application if he is working with this company.  Saralyn Pilar, DO [facility] 09/13/2022, 6:15 PM

## 2022-09-14 NOTE — Telephone Encounter (Signed)
Left message for patient to call and schedule appointment.

## 2022-09-19 ENCOUNTER — Telehealth: Payer: 59

## 2022-09-19 ENCOUNTER — Telehealth: Payer: Self-pay

## 2022-09-19 NOTE — Progress Notes (Signed)
ADVANCED HEART FAILURE CLINIC NOTE  Referring Physician: Saralyn Pilar *  Primary Care: Smitty Cords, DO Primary Cardiologist: Dr. Okey Dupre HF: Dr. Gala Romney  HPI: Shane Frazier. 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   Cardiac history dates back to 2019 when preoperative cardiovascular risk assessment by Dr. Mariah Milling was notable for an LVEF of 40 to 45% during presentation for multilobar pneumonia.  Unfortunately he did not follow-up after that time.    In April 2024 he presented with acute chest pain with EKG demonstrating anterior lateral and inferior ST elevations.  He had a left heart cath that demonstrated thrombotic occlusion of the distal LAD with 50% ostial RCA stenosis; he underwent PCI to the distal LAD.  At that time he was also drinking 1/5 of liquor almost daily.  Echo EF 20-25% with high sphericity index.  Cardiac MRI  with a EF of 23%, severely reduced RV function and transmural LGE in the apex with a mildly elevated ECV of 39%.  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 He does about 15-20 minutes of physical labor including raking, etc. He intermittently gets short of breath but this resolves quickly with short breaks; no chest pain or lightheadedness since PCI last week.    Past Medical History:  Diagnosis Date   CHF (congestive heart failure), NYHA class I, acute on chronic, systolic (HCC)    Concussion 2017   cause of seizure after hit head at work   Difficult intubation    patient unaware of this diagnosis   ED (erectile dysfunction)    Hypothyroidism    Seizures (HCC) 05-20-15   X 1-PT HIT HIS HEAD AT WORK AND THEN HAD A SEIZURE    Current Outpatient Medications  Medication Sig Dispense Refill   aspirin 81 MG chewable tablet Chew  1 tablet (81 mg total) by mouth daily. 30 tablet 0   atorvastatin (LIPITOR) 80 MG tablet Take 1 tablet (80 mg total) by mouth at bedtime. 30 tablet 0   carvedilol (COREG) 3.125 MG tablet Take 3.125 mg by mouth 2 (two) times daily with a meal.     dapagliflozin propanediol (FARXIGA) 10 MG TABS tablet Take 1 tablet (10 mg total) by mouth daily. 30 tablet 0   folic acid (FOLVITE) 1 MG tablet Take 1 tablet (1 mg total) by mouth daily. 30 tablet 0   furosemide (LASIX) 40 MG tablet Take 1 tablet (40 mg total) by mouth daily as needed for edema or fluid. 30 tablet 0   levothyroxine (SYNTHROID) 175 MCG tablet Take 1 tablet (175 mcg total) by mouth daily before breakfast. 90 tablet 3   metFORMIN (GLUCOPHAGE) 500 MG tablet TAKE 1 TABLET (500 MG TOTAL) BY MOUTH 2 (TWO) TIMES DAILY WITH A MEAL. 180 tablet 3   Multiple Vitamin (MULTIVITAMIN WITH MINERALS) TABS tablet Take 1 tablet by mouth daily.     sacubitril-valsartan (ENTRESTO) 97-103 MG Take 1 tablet by mouth 2 (two) times daily. 60 tablet 3   spironolactone (ALDACTONE) 25 MG tablet Take 0.5 tablets (12.5 mg total) by mouth daily. 30 tablet 0   thiamine (VITAMIN B-1) 100 MG tablet Take 1 tablet (100 mg total) by mouth daily. 30 tablet 0   No current facility-administered medications  for this visit.    Allergies  Allergen Reactions   Other Other (See Comments)    BRAZILIAN NUTS ONLY-"THROAT CLOSES UP"      Social History   Socioeconomic History   Marital status: Married    Spouse name: Not on file   Number of children: Not on file   Years of education: Not on file   Highest education level: Not on file  Occupational History   Not on file  Tobacco Use   Smoking status: Never   Smokeless tobacco: Current    Types: Chew  Vaping Use   Vaping Use: Never used  Substance and Sexual Activity   Alcohol use: Not Currently    Alcohol/week: 8.0 standard drinks of alcohol    Types: 8 Shots of liquor per week    Comment: occasionally. rarely    Drug use: No   Sexual activity: Yes  Other Topics Concern   Not on file  Social History Narrative   Not on file   Social Determinants of Health   Financial Resource Strain: Not on file  Food Insecurity: Food Insecurity Present (08/10/2022)   Hunger Vital Sign    Worried About Running Out of Food in the Last Year: Sometimes true    Ran Out of Food in the Last Year: Sometimes true  Transportation Needs: Unmet Transportation Needs (08/10/2022)   PRAPARE - Administrator, Civil Service (Medical): Yes    Lack of Transportation (Non-Medical): Patient unable to answer  Physical Activity: Not on file  Stress: Not on file  Social Connections: Not on file  Intimate Partner Violence: Not At Risk (08/10/2022)   Humiliation, Afraid, Rape, and Kick questionnaire    Fear of Current or Ex-Partner: No    Emotionally Abused: No    Physically Abused: No    Sexually Abused: No      Family History  Problem Relation Age of Onset   Hypertension Mother    Cancer Father    Diabetes Sister    Thyroid cancer Sister     PHYSICAL EXAM: There were no vitals filed for this visit.  GENERAL: Well nourished, well developed, and in no apparent distress at rest.  HEENT: Negative for arcus senilis or xanthelasma. There is no scleral icterus.  The mucous membranes are pink and moist.   NECK: Supple, No masses. Normal carotid upstrokes without bruits. No masses or thyromegaly.    CHEST: There are no chest wall deformities. There is no chest wall tenderness. Respirations are unlabored.  Lungs- CTA B/L CARDIAC:  JVP: 9 cm H2O         Normal S1, S2  Normal rate with regular rhythm. No murmurs, rubs or gallops.  Pulses are 2+ and symmetrical in upper and lower extremities. 1+ edema.  ABDOMEN: Soft, non-tender, non-distended. There are no masses or hepatomegaly. There are normal bowel sounds.  EXTREMITIES: Warm and well perfused with no cyanosis, clubbing.  LYMPHATIC: No axillary or supraclavicular  lymphadenopathy.  NEUROLOGIC: Patient is oriented x3 with no focal or lateralizing neurologic deficits.  PSYCH: Patients affect is appropriate, there is no evidence of anxiety or depression.  SKIN: Warm and dry; no lesions or wounds.   DATA REVIEW  ECG: 08/10/22: NSR  as per my personal interpretation  ECHO: 08/10/22: LVEF 20-25%, moderate LVH, preserved RV function as per my personal interpretation  CATH: 08/09/22: Severe single-vessel coronary artery disease with thrombotic occlusion of the distal LAD.  There is 50% stenosis at the ostium of  the RCA, which does not appear obstructive. Severely reduced left ventricular systolic function (LVEF 25-35%) with severely elevated filling pressure (LVEDP 40 mmHg).  Degree of LVEF is out of proportion to CAD, suggestive of predominantly nonischemic cardiomyopathy. Successful PCI to distal LAD occlusion using Onyx Frontier 2.25 x 18 mm drug-eluting stent with 0% residual stenosis and TIMI-3 flow.  CMR:  08/12/22: 1. Mildly dilated LV, severely reduced LV systolic function. LVEF 23%.  2. Near transmural apical LGE/scar.  3. LV septal midwall LGE at RV insertion sites.  4. Severely reduced RV function.  5. Findings suggest prior LAD infarct.  6. Mixed ischemic and non-ischemic cardiomyopathy.  ASSESSMENT & PLAN:  Heart failure with reduced EF - Mixed ischemic and nonischemic cardiomyopathy. - Echo 2019 EF40-45% - Acute STEMI 4/24 LAD thrombotic occlusion in 4/24 with transmural LGE in the apex by CMR.  -  Echo EF 20-25% with high sphericity index.   - Cardiac MRI  with a EF of 23%, severely reduced RV function and transmural LGE in the apex with a mildly elevated ECV of 39%. - NYHA II - Volume ok Continue alsix 40 daily - Continue Entresto 97/103mg  BID - Continue carvedilol 6.25mg  BID - Continue spiro 12.5mg  daily - Continue Farxiga 10mg  daily  2. CAD - STEMI 4/24  - CATH 4/24 Severe single-vessel coronary artery disease with  thrombotic occlusion of the distal LAD.  There is 50% stenosis at the ostium of the RCA, which does not appear obstructive. -> PCI LAD  3. HTN - Well controlled today. See above.   4. DM2  - A1C 10.3 on 08/09/22  5. Obesity -There is no height or weight on file to calculate BMI. -discussed importance of weight loss and dietary restrictions.   Arvilla Meres, MD  10:47 PM

## 2022-09-19 NOTE — Telephone Encounter (Signed)
   CC RN Visit Note   09-19-2022 Name: Shane Frazier. MRN: [id number]      DOB: 1965-12-21  Subjective: Shane Frazier. is a 57 y.o. year old male who is a primary care patient of Dr. Althea Charon The patient was referred to the Chronic Care Management team for assistance with care management needs subsequent to provider initiation of CCM services and plan of care.      An unsuccessful telephone outreach was attempted today to contact the patient about Chronic Care Management needs.    Plan:A HIPAA compliant phone message was left for the patient providing contact information and requesting a return call.  Alto Denver RN, MSN, CCM RN Care Manager  Chronic Care Management Direct Number: [phone]

## 2022-09-20 ENCOUNTER — Other Ambulatory Visit
Admission: RE | Admit: 2022-09-20 | Discharge: 2022-09-20 | Disposition: A | Discharge status: Home / Self Care | Payer: 59 | Source: Ambulatory Visit | Attending: Internal Medicine | Admitting: Internal Medicine

## 2022-09-20 ENCOUNTER — Ambulatory Visit (HOSPITAL_BASED_OUTPATIENT_CLINIC_OR_DEPARTMENT_OTHER): Payer: 59 | Admitting: Internal Medicine

## 2022-09-20 VITALS — BP 124/79 | HR 97 | Ht 67.5 in | Wt 241.0 lb

## 2022-09-20 DIAGNOSIS — I2510 Atherosclerotic heart disease of native coronary artery without angina pectoris: Secondary | ICD-10-CM | POA: Diagnosis not present

## 2022-09-20 DIAGNOSIS — F1010 Alcohol abuse, uncomplicated: Secondary | ICD-10-CM

## 2022-09-20 DIAGNOSIS — I5022 Chronic systolic (congestive) heart failure: Secondary | ICD-10-CM

## 2022-09-20 DIAGNOSIS — I10 Essential (primary) hypertension: Secondary | ICD-10-CM

## 2022-09-20 LAB — BASIC METABOLIC PANEL
Anion gap: 9 (ref 5–15)
BUN: 24 mg/dL — ABNORMAL HIGH (ref 6–20)
CO2: 24 mmol/L (ref 22–32)
Calcium: 8.9 mg/dL (ref 8.9–10.3)
Chloride: 100 mmol/L (ref 98–111)
Creatinine, Ser: 1.38 mg/dL — ABNORMAL HIGH (ref 0.61–1.24)
GFR, Estimated: >60 mL/min (ref >60)
Glucose, Bld: 262 mg/dL — ABNORMAL HIGH (ref 70–99)
Potassium: 3.8 mmol/L (ref 3.5–5.1)
Sodium: 133 mmol/L — ABNORMAL LOW (ref 135–145)

## 2022-09-20 LAB — BRAIN NATRIURETIC PEPTIDE: B Natriuretic Peptide: 204.9 pg/mL — ABNORMAL HIGH (ref 0.0–100.0)

## 2022-09-20 NOTE — Patient Instructions (Addendum)
Medication Changes:  Take lasix only as needed  Lab Work:  Labs done today, your results will be available in MyChart, we will contact you for abnormal readings.   Testing/Procedures:  Your physician has requested that you have an echocardiogram. Echocardiography is a painless test that uses sound waves to create images of your heart. It provides your doctor with information about the size and shape of your heart and how well your heart's chambers and valves are working. This procedure takes approximately one hour. There are no restrictions for this procedure. Your appointment is scheduled for August 27th at 10 AM.  Please do NOT wear cologne, perfume, aftershave, or lotions (deodorant is allowed). Please arrive 15 minutes prior to your appointment time at the [facility].   Referrals:  You have been referred to Cardiac Rehabilitation. They will call you to schedule an appointment.   Special Instructions // Education:  Do the following things EVERYDAY: Weigh yourself in the morning before breakfast. Write it down and keep it in a log. Take your medicines as prescribed Eat low salt foods--Limit salt (sodium) to 2000 mg per day.  Stay as active as you can everyday Limit all fluids for the day to less than 2 liters   Follow-Up in: 2 months with Dr. Leory Plowman after echo: 12/13/22 at 11:15 AM    If you have any questions or concerns before your next appointment please send [place] a message through mychart or call our office at [phone] Monday-Friday 8 am-5 pm.   If you have an urgent need after hours on the weekend please call your Primary Cardiologist or the Advanced Heart Failure Clinic in [place] at [phone].

## 2022-09-20 NOTE — Addendum Note (Signed)
Addended by: Simonne Maffucci on: 09/20/2022 11:25 AM   Modules accepted: Orders

## 2022-09-26 ENCOUNTER — Telehealth: Payer: Self-pay

## 2022-09-26 NOTE — Progress Notes (Signed)
  Chronic Care Management Note  09/26/2022 Name: Shane Frazier. MRN: [id number] DOB: 1966/03/30  Shane Roes Tiny Rietz. is a 57 y.o. year old male who is a primary care patient of Smitty Cords, DO and is actively engaged with the Chronic Care Management team. I reached out to [name]. by phone today to assist with re-scheduling a follow up visit with the RN Case Manager  Follow up plan: Unsuccessful telephone outreach attempt made. A HIPAA compliant phone message was left for the patient providing contact information and requesting a return call.  The care management team will reach out to the patient again over the next 7 days.  If patient returns call to provider office, please advise to call CCM Care Guide Penne Lash  at [phone]  Penne Lash, RMA Care Guide [facility]  [place], [place] [address] Direct Dial: [phone] [email or web address]

## 2022-10-03 ENCOUNTER — Telehealth: Payer: 59

## 2022-10-03 ENCOUNTER — Telehealth: Payer: Self-pay | Admitting: Pharmacist

## 2022-10-03 NOTE — Telephone Encounter (Signed)
   Outreach Note  10/03/2022 Name: Shane Frazier. MRN: [id number] DOB: [birth year]  Referred by: Smitty Cords, DO Reason for referral : No chief complaint on file.   Was unable to reach patient via telephone today x 2 and have left HIPAA compliant voicemail asking patient to return my call.   Follow Up Plan: Will collaborate with Care Guide to outreach to reschedule an initial with me  Estelle Grumbles, PharmD, [facility] Clinical Pharmacist [facility] [phone]

## 2022-10-21 NOTE — Progress Notes (Signed)
  Care Coordination Note  10/21/2022 Name: Shane Frazier. MRN: [id number] DOB: 01/12/1966  Shane Frazier. is a 57 y.o. year old male who is a primary care patient of Smitty Cords, DO and is actively engaged with the Chronic Care Management team. I reached out to [name]. by phone today to assist with re-scheduling an initial visit with the RN Case Manager and Pharmacist  Follow up plan: Unsuccessful telephone outreach attempt made. A HIPAA compliant phone message was left for the patient providing contact information and requesting a return call.  The care management team will reach out to the patient again over the next 7 days.  If patient returns call to provider office, please advise to call CCM Care Guide Penne Lash at [phone]  Penne Lash, RMA Care Guide [facility]  [place], [place] [address] Direct Dial: [phone] [email or web address]

## 2022-11-03 NOTE — Progress Notes (Signed)
  Care Coordination Note  11/03/2022 Name: Shane Frazier. MRN: [id number] DOB: [birth year]  Shane Stanley. is a 57 y.o. year old male who is a primary care patient of Smitty Cords, DO and is actively engaged with the Chronic Care Management team. I reached out to Shane Stanley. by phone today to assist with re-scheduling an initial visit with the RN Case Manager and Pharmacist  Follow up plan: Telephone appointment with care management team member scheduled [id number] 11/18/2022 Pt decined rs with Pharm d at this time    Penne Lash, RMA Care Guide [facility]  [place], [place] [address] Direct Dial: [phone] [email or web address]

## 2022-11-18 ENCOUNTER — Telehealth: Payer: Self-pay

## 2022-11-18 ENCOUNTER — Telehealth: Payer: 59

## 2022-11-18 ENCOUNTER — Other Ambulatory Visit: Payer: 59

## 2022-11-18 NOTE — Patient Outreach (Signed)
  Care Management   Follow Up Note   11/18/2022 Name: [name]. MRN: [id number] DOB: [birth year]   Referred by: Smitty Cords, DO Reason for referral : Care Management (RNCM: Initial Outreach for Chronic Disease Management and Care Coordination Needs- attempt)   An unsuccessful telephone outreach was attempted today. The patient was referred to the case management team for assistance with care management and care coordination.   Follow Up Plan: The care management team will reach out to the patient again over the next 30 to 60 days days.   Alto Denver RN, MSN, CCM RN Care Manager  [facility]  Ambulatory Care Management  Direct Number: [phone]

## 2022-11-21 ENCOUNTER — Telehealth: Payer: Self-pay

## 2022-11-21 NOTE — Telephone Encounter (Signed)
Hey Pam  This pt can not use phone unless he has wifi. I tried to tell pt he needed to be somewhere with [facility] for this appt and I guess he wasn't.  Thank you  Penne Lash, RMA Care Guide [facility]  [place], [place] [address] Direct Dial: [phone] [email or web address]

## 2022-11-21 NOTE — Patient Outreach (Signed)
  Care Management   Follow Up Note   11/21/2022 Name: Daved Bauernfeind. MRN: [id number] DOB: 02-Dec-1965   Referred by: Smitty Cords, DO Reason for referral : Care Management (RNCM: Initial Outreach for Chronic Disease Management and Care Coordination Needs- attempt)   A second unsuccessful telephone outreach was attempted today. The patient was referred to the case management team for assistance with care management and care coordination.   Follow Up Plan: The care management team will reach out to the patient again over the next 30 days.   Alto Denver RN, MSN, CCM RN Care Manager  [facility]  Ambulatory Care Management  Direct Number: [phone]

## 2022-12-02 ENCOUNTER — Ambulatory Visit: Payer: 59 | Admitting: Family Medicine

## 2022-12-13 ENCOUNTER — Encounter: Payer: 59 | Admitting: Internal Medicine

## 2022-12-13 ENCOUNTER — Ambulatory Visit: Admission: RE | Admit: 2022-12-13 | Payer: 59 | Source: Ambulatory Visit

## 2023-02-08 ENCOUNTER — Telehealth: Payer: Self-pay | Admitting: *Deleted

## 2023-02-08 NOTE — Patient Outreach (Signed)
  Care Management   Follow Up Note   02/08/2023 Name: Shane Frazier. MRN: [id number] DOB: May 03, 1965   Referred by: Smitty Cords, DO Reason for referral : Care Management (RNCM: Attempt to reach patient for initial chronic disease management & care coordination needs)   Third unsuccessful telephone outreach was attempted today. The patient was referred to the case management team for assistance with care management and care coordination. The patient's primary care provider has been notified of our unsuccessful attempts to make or maintain contact with the patient. The care management team is pleased to engage with this patient at any time in the future should he/she be interested in assistance from the care management team.   Follow Up Plan: We have been unable to make contact with the patient for follow up. The care management team is available to follow up with the patient after provider conversation with the patient regarding recommendation for care management engagement and subsequent re-referral to the care management team.   Danise Edge, BSN RN RN Care Manager  [facility]  Ambulatory Care Management  Direct Number: [phone]

## 2023-05-31 ENCOUNTER — Telehealth: Payer: Self-pay

## 2023-05-31 NOTE — Telephone Encounter (Signed)
Patient was identified as falling into the True [place] Measure - Diabetes.   Patient was: Appointment scheduled for lab or office visit for A1c. Scheduled patient with Dr. Kirtland Bouchard for 06/02/2023 at 9:40.

## 2023-06-02 ENCOUNTER — Ambulatory Visit: Payer: 59 | Admitting: Family Medicine

## 2023-06-26 ENCOUNTER — Telehealth: Payer: Self-pay

## 2023-06-26 NOTE — Telephone Encounter (Signed)
 Patient was identified as falling into the True North Measure - Diabetes.   Patient was: Left voicemail to schedule with primary care provider.

## 2023-07-20 ENCOUNTER — Other Ambulatory Visit: Payer: Self-pay

## 2023-07-20 ENCOUNTER — Emergency Department

## 2023-07-20 ENCOUNTER — Inpatient Hospital Stay
Admission: EM | Admit: 2023-07-20 | Discharge: 2023-07-22 | DRG: 291 | Disposition: A | Discharge status: Home / Self Care | Attending: Internal Medicine | Admitting: Internal Medicine

## 2023-07-20 DIAGNOSIS — Z79899 Other long term (current) drug therapy: Secondary | ICD-10-CM

## 2023-07-20 DIAGNOSIS — E669 Obesity, unspecified: Secondary | ICD-10-CM | POA: Diagnosis not present

## 2023-07-20 DIAGNOSIS — I10 Essential (primary) hypertension: Secondary | ICD-10-CM | POA: Diagnosis not present

## 2023-07-20 DIAGNOSIS — E785 Hyperlipidemia, unspecified: Secondary | ICD-10-CM | POA: Diagnosis present

## 2023-07-20 DIAGNOSIS — Z635 Disruption of family by separation and divorce: Secondary | ICD-10-CM | POA: Diagnosis not present

## 2023-07-20 DIAGNOSIS — I5A Non-ischemic myocardial injury (non-traumatic): Secondary | ICD-10-CM | POA: Diagnosis present

## 2023-07-20 DIAGNOSIS — Z91148 Patient's other noncompliance with medication regimen for other reason: Secondary | ICD-10-CM

## 2023-07-20 DIAGNOSIS — Z833 Family history of diabetes mellitus: Secondary | ICD-10-CM

## 2023-07-20 DIAGNOSIS — Z7902 Long term (current) use of antithrombotics/antiplatelets: Secondary | ICD-10-CM | POA: Diagnosis not present

## 2023-07-20 DIAGNOSIS — E876 Hypokalemia: Secondary | ICD-10-CM | POA: Diagnosis not present

## 2023-07-20 DIAGNOSIS — I252 Old myocardial infarction: Secondary | ICD-10-CM | POA: Diagnosis not present

## 2023-07-20 DIAGNOSIS — Z6837 Body mass index (BMI) 37.0-37.9, adult: Secondary | ICD-10-CM

## 2023-07-20 DIAGNOSIS — R569 Unspecified convulsions: Secondary | ICD-10-CM | POA: Diagnosis present

## 2023-07-20 DIAGNOSIS — R918 Other nonspecific abnormal finding of lung field: Secondary | ICD-10-CM | POA: Diagnosis not present

## 2023-07-20 DIAGNOSIS — R0602 Shortness of breath: Secondary | ICD-10-CM

## 2023-07-20 DIAGNOSIS — F1090 Alcohol use, unspecified, uncomplicated: Secondary | ICD-10-CM | POA: Diagnosis present

## 2023-07-20 DIAGNOSIS — I2510 Atherosclerotic heart disease of native coronary artery without angina pectoris: Secondary | ICD-10-CM | POA: Diagnosis present

## 2023-07-20 DIAGNOSIS — Z7984 Long term (current) use of oral hypoglycemic drugs: Secondary | ICD-10-CM

## 2023-07-20 DIAGNOSIS — Z7982 Long term (current) use of aspirin: Secondary | ICD-10-CM

## 2023-07-20 DIAGNOSIS — R778 Other specified abnormalities of plasma proteins: Secondary | ICD-10-CM | POA: Diagnosis not present

## 2023-07-20 DIAGNOSIS — F1010 Alcohol abuse, uncomplicated: Secondary | ICD-10-CM | POA: Diagnosis present

## 2023-07-20 DIAGNOSIS — I110 Hypertensive heart disease with heart failure: Secondary | ICD-10-CM | POA: Diagnosis not present

## 2023-07-20 DIAGNOSIS — I5023 Acute on chronic systolic (congestive) heart failure: Secondary | ICD-10-CM | POA: Diagnosis present

## 2023-07-20 DIAGNOSIS — I509 Heart failure, unspecified: Secondary | ICD-10-CM | POA: Diagnosis not present

## 2023-07-20 DIAGNOSIS — G4733 Obstructive sleep apnea (adult) (pediatric): Secondary | ICD-10-CM

## 2023-07-20 DIAGNOSIS — E1169 Type 2 diabetes mellitus with other specified complication: Secondary | ICD-10-CM

## 2023-07-20 DIAGNOSIS — R7989 Other specified abnormal findings of blood chemistry: Secondary | ICD-10-CM | POA: Diagnosis present

## 2023-07-20 DIAGNOSIS — Z8249 Family history of ischemic heart disease and other diseases of the circulatory system: Secondary | ICD-10-CM | POA: Diagnosis not present

## 2023-07-20 DIAGNOSIS — Z79890 Hormone replacement therapy (postmenopausal): Secondary | ICD-10-CM | POA: Diagnosis not present

## 2023-07-20 DIAGNOSIS — Z720 Tobacco use: Secondary | ICD-10-CM | POA: Diagnosis not present

## 2023-07-20 DIAGNOSIS — E11319 Type 2 diabetes mellitus with unspecified diabetic retinopathy without macular edema: Secondary | ICD-10-CM | POA: Diagnosis not present

## 2023-07-20 DIAGNOSIS — Z1152 Encounter for screening for COVID-19: Secondary | ICD-10-CM

## 2023-07-20 DIAGNOSIS — I5043 Acute on chronic combined systolic (congestive) and diastolic (congestive) heart failure: Secondary | ICD-10-CM | POA: Diagnosis not present

## 2023-07-20 DIAGNOSIS — E039 Hypothyroidism, unspecified: Secondary | ICD-10-CM | POA: Diagnosis present

## 2023-07-20 DIAGNOSIS — Z808 Family history of malignant neoplasm of other organs or systems: Secondary | ICD-10-CM

## 2023-07-20 DIAGNOSIS — Z91018 Allergy to other foods: Secondary | ICD-10-CM

## 2023-07-20 LAB — CBC WITH DIFFERENTIAL/PLATELET
Abs Immature Granulocytes: 0.02 10*3/uL (ref 0.00–0.07)
Basophils Absolute: 0 10*3/uL (ref 0.0–0.1)
Basophils Relative: 1 %
Eosinophils Absolute: 0.1 10*3/uL (ref 0.0–0.5)
Eosinophils Relative: 2 %
HCT: 42.1 % (ref 39.0–52.0)
Hemoglobin: 14.2 g/dL (ref 13.0–17.0)
Immature Granulocytes: 0 %
Lymphocytes Relative: 23 %
Lymphs Abs: 1.3 10*3/uL (ref 0.7–4.0)
MCH: 30.1 pg (ref 26.0–34.0)
MCHC: 33.7 g/dL (ref 30.0–36.0)
MCV: 89.4 fL (ref 80.0–100.0)
Monocytes Absolute: 0.5 10*3/uL (ref 0.1–1.0)
Monocytes Relative: 9 %
Neutro Abs: 3.8 10*3/uL (ref 1.7–7.7)
Neutrophils Relative %: 65 %
Platelets: 248 10*3/uL (ref 150–400)
RBC: 4.71 MIL/uL (ref 4.22–5.81)
RDW: 14 % (ref 11.5–15.5)
WBC: 5.8 10*3/uL (ref 4.0–10.5)
nRBC: 0 % (ref 0.0–0.2)

## 2023-07-20 LAB — COMPREHENSIVE METABOLIC PANEL WITH GFR
ALT: 27 U/L (ref 0–44)
AST: 25 U/L (ref 15–41)
Albumin: 3.3 g/dL — ABNORMAL LOW (ref 3.5–5.0)
Alkaline Phosphatase: 70 U/L (ref 38–126)
Anion gap: 9 (ref 5–15)
BUN: 17 mg/dL (ref 6–20)
CO2: 28 mmol/L (ref 22–32)
Calcium: 8.8 mg/dL — ABNORMAL LOW (ref 8.9–10.3)
Chloride: 103 mmol/L (ref 98–111)
Creatinine, Ser: 1.02 mg/dL (ref 0.61–1.24)
GFR, Estimated: >60 mL/min (ref >60)
Glucose, Bld: 271 mg/dL — ABNORMAL HIGH (ref 70–99)
Potassium: 3.9 mmol/L (ref 3.5–5.1)
Sodium: 140 mmol/L (ref 135–145)
Total Bilirubin: 0.8 mg/dL (ref 0.0–1.2)
Total Protein: 6.8 g/dL (ref 6.5–8.1)

## 2023-07-20 LAB — RESP PANEL BY RT-PCR (RSV, FLU A&B, COVID)  RVPGX2
Influenza A by PCR: NEGATIVE
Influenza B by PCR: NEGATIVE
Resp Syncytial Virus by PCR: NEGATIVE
SARS Coronavirus 2 by RT PCR: NEGATIVE

## 2023-07-20 LAB — TROPONIN I (HIGH SENSITIVITY)
Troponin I (High Sensitivity): 93 ng/L — ABNORMAL HIGH (ref <18)
Troponin I (High Sensitivity): 89 ng/L — ABNORMAL HIGH (ref <18)

## 2023-07-20 LAB — BASIC METABOLIC PANEL WITH GFR
Anion gap: 8 (ref 5–15)
BUN: 18 mg/dL (ref 6–20)
CO2: 28 mmol/L (ref 22–32)
Calcium: 8.8 mg/dL — ABNORMAL LOW (ref 8.9–10.3)
Chloride: 105 mmol/L (ref 98–111)
Creatinine, Ser: 1.1 mg/dL (ref 0.61–1.24)
GFR, Estimated: >60 mL/min (ref >60)
Glucose, Bld: 249 mg/dL — ABNORMAL HIGH (ref 70–99)
Potassium: 3.7 mmol/L (ref 3.5–5.1)
Sodium: 141 mmol/L (ref 135–145)

## 2023-07-20 LAB — CBG MONITORING, ED: Glucose-Capillary: 271 mg/dL — ABNORMAL HIGH (ref 70–99)

## 2023-07-20 LAB — TSH: TSH: 25.444 u[IU]/mL — ABNORMAL HIGH (ref 0.350–4.500)

## 2023-07-20 LAB — BRAIN NATRIURETIC PEPTIDE: B Natriuretic Peptide: 749.1 pg/mL — ABNORMAL HIGH (ref 0.0–100.0)

## 2023-07-20 LAB — PROCALCITONIN: Procalcitonin: <0.1 ng/mL

## 2023-07-20 LAB — MAGNESIUM: Magnesium: 1.7 mg/dL (ref 1.7–2.4)

## 2023-07-20 MED ORDER — DM-GUAIFENESIN ER 30-600 MG PO TB12
1.0000 | ORAL_TABLET | Freq: Two times a day (BID) | ORAL | Status: DC | PRN
  Administered 2023-07-22: 1 via ORAL
  Filled 2023-07-20 (×2): qty 1

## 2023-07-20 MED ORDER — THIAMINE HCL 100 MG/ML IJ SOLN
100.0000 mg | Freq: Every day | INTRAMUSCULAR | Status: DC
  Filled 2023-07-20: qty 2

## 2023-07-20 MED ORDER — ATORVASTATIN CALCIUM 80 MG PO TABS
80.0000 mg | ORAL_TABLET | Freq: Every day | ORAL | Status: DC
  Administered 2023-07-20 – 2023-07-21 (×2): 80 mg via ORAL
  Filled 2023-07-20: qty 1
  Filled 2023-07-20: qty 4

## 2023-07-20 MED ORDER — INSULIN ASPART 100 UNIT/ML IJ SOLN
0.0000 [IU] | Freq: Three times a day (TID) | INTRAMUSCULAR | Status: DC
  Administered 2023-07-21: 2 [IU] via SUBCUTANEOUS
  Administered 2023-07-21: 3 [IU] via SUBCUTANEOUS
  Administered 2023-07-21: 5 [IU] via SUBCUTANEOUS
  Administered 2023-07-22: 2 [IU] via SUBCUTANEOUS
  Administered 2023-07-22: 3 [IU] via SUBCUTANEOUS
  Filled 2023-07-20 (×5): qty 1

## 2023-07-20 MED ORDER — ENOXAPARIN SODIUM 40 MG/0.4ML IJ SOSY: 40.0000 mg | PREFILLED_SYRINGE | INTRAMUSCULAR | Status: DC

## 2023-07-20 MED ORDER — ONDANSETRON HCL 4 MG/2ML IJ SOLN: 4.0000 mg | Freq: Three times a day (TID) | INTRAMUSCULAR | Status: DC | PRN

## 2023-07-20 MED ORDER — FOLIC ACID 1 MG PO TABS
1.0000 mg | ORAL_TABLET | Freq: Every day | ORAL | Status: DC
  Administered 2023-07-20 – 2023-07-22 (×3): 1 mg via ORAL
  Filled 2023-07-20 (×3): qty 1

## 2023-07-20 MED ORDER — ACETAMINOPHEN 325 MG PO TABS: 650.0000 mg | ORAL_TABLET | Freq: Four times a day (QID) | ORAL | Status: DC | PRN

## 2023-07-20 MED ORDER — FUROSEMIDE 10 MG/ML IJ SOLN
40.0000 mg | Freq: Once | INTRAMUSCULAR | Status: AC
  Administered 2023-07-20: 40 mg via INTRAVENOUS
  Filled 2023-07-20: qty 4

## 2023-07-20 MED ORDER — CARVEDILOL 3.125 MG PO TABS
3.1250 mg | ORAL_TABLET | Freq: Two times a day (BID) | ORAL | Status: DC
  Administered 2023-07-21 – 2023-07-22 (×3): 3.125 mg via ORAL
  Filled 2023-07-20 (×3): qty 1

## 2023-07-20 MED ORDER — SACUBITRIL-VALSARTAN 97-103 MG PO TABS
1.0000 | ORAL_TABLET | Freq: Two times a day (BID) | ORAL | Status: DC
  Administered 2023-07-20 – 2023-07-22 (×4): 1 via ORAL
  Filled 2023-07-20 (×4): qty 1

## 2023-07-20 MED ORDER — SPIRONOLACTONE 12.5 MG HALF TABLET
12.5000 mg | ORAL_TABLET | Freq: Every day | ORAL | Status: DC
  Administered 2023-07-20 – 2023-07-22 (×3): 12.5 mg via ORAL
  Filled 2023-07-20 (×3): qty 1

## 2023-07-20 MED ORDER — HYDRALAZINE HCL 20 MG/ML IJ SOLN: 5.0000 mg | INTRAMUSCULAR | Status: DC | PRN

## 2023-07-20 MED ORDER — ADULT MULTIVITAMIN W/MINERALS CH
1.0000 | ORAL_TABLET | Freq: Every day | ORAL | Status: DC
Start: 2023-07-20 — End: 2023-07-22
  Administered 2023-07-20 – 2023-07-22 (×3): 1 via ORAL
  Filled 2023-07-20 (×3): qty 1

## 2023-07-20 MED ORDER — IPRATROPIUM-ALBUTEROL 0.5-2.5 (3) MG/3ML IN SOLN
3.0000 mL | Freq: Once | RESPIRATORY_TRACT | Status: AC
  Administered 2023-07-20: 3 mL via RESPIRATORY_TRACT
  Filled 2023-07-20: qty 3

## 2023-07-20 MED ORDER — INSULIN ASPART 100 UNIT/ML IJ SOLN
0.0000 [IU] | Freq: Every day | INTRAMUSCULAR | Status: DC
Start: 2023-07-20 — End: 2023-07-22
  Administered 2023-07-20 – 2023-07-21 (×2): 5 [IU] via SUBCUTANEOUS
  Filled 2023-07-20 (×3): qty 1

## 2023-07-20 MED ORDER — FUROSEMIDE 10 MG/ML IJ SOLN
40.0000 mg | Freq: Two times a day (BID) | INTRAMUSCULAR | Status: DC
  Administered 2023-07-21 – 2023-07-22 (×3): 40 mg via INTRAVENOUS
  Filled 2023-07-20 (×3): qty 4

## 2023-07-20 MED ORDER — LORAZEPAM 2 MG/ML IJ SOLN: 1.0000 mg | INTRAMUSCULAR | Status: DC | PRN

## 2023-07-20 MED ORDER — THIAMINE MONONITRATE 100 MG PO TABS
100.0000 mg | ORAL_TABLET | Freq: Every day | ORAL | Status: DC
  Administered 2023-07-20 – 2023-07-22 (×3): 100 mg via ORAL
  Filled 2023-07-20 (×3): qty 1

## 2023-07-20 MED ORDER — LORAZEPAM 2 MG/ML IJ SOLN
0.0000 mg | Freq: Four times a day (QID) | INTRAMUSCULAR | Status: DC
  Administered 2023-07-20: 0 mg via INTRAVENOUS

## 2023-07-20 MED ORDER — ENOXAPARIN SODIUM 60 MG/0.6ML IJ SOSY
55.0000 mg | PREFILLED_SYRINGE | INTRAMUSCULAR | Status: DC
  Administered 2023-07-20 – 2023-07-21 (×2): 55 mg via SUBCUTANEOUS
  Filled 2023-07-20 (×2): qty 0.6

## 2023-07-20 MED ORDER — ASPIRIN 81 MG PO CHEW
81.0000 mg | CHEWABLE_TABLET | Freq: Every day | ORAL | Status: DC
  Administered 2023-07-20 – 2023-07-22 (×3): 81 mg via ORAL
  Filled 2023-07-20 (×3): qty 1

## 2023-07-20 MED ORDER — LORAZEPAM 2 MG/ML IJ SOLN: 0.0000 mg | Freq: Two times a day (BID) | INTRAMUSCULAR | Status: DC

## 2023-07-20 MED ORDER — LEVOTHYROXINE SODIUM 175 MCG PO TABS
175.0000 ug | ORAL_TABLET | Freq: Every day | ORAL | Status: DC
  Administered 2023-07-21 – 2023-07-22 (×2): 175 ug via ORAL
  Filled 2023-07-20 (×2): qty 1

## 2023-07-20 MED ORDER — LORAZEPAM 1 MG PO TABS: 1.0000 mg | ORAL_TABLET | ORAL | Status: DC | PRN

## 2023-07-20 MED ORDER — ALBUTEROL SULFATE (2.5 MG/3ML) 0.083% IN NEBU
3.0000 mL | INHALATION_SOLUTION | RESPIRATORY_TRACT | Status: DC | PRN
  Administered 2023-07-21 – 2023-07-22 (×2): 3 mL via RESPIRATORY_TRACT
  Filled 2023-07-20 (×2): qty 3

## 2023-07-20 NOTE — ED Provider Triage Note (Signed)
 Emergency Medicine Provider Triage Evaluation Note  Shane Frazier. , a 58 y.o. male  was evaluated in triage.  Pt complains of cough, shortness of breath, history of MI 1 year ago.  Some swelling in the ankles.  Has felt short of breath for 2 weeks.  Review of Systems  Positive:  Negative:   Physical Exam  There were no vitals taken for this visit. Gen:   Awake, no distress   Resp:  Increased effort, wheezing noted bilaterally MSK:   Moves extremities without difficulty  Other:    Medical Decision Making  Medically screening exam initiated at 1:33 PM.  Appropriate orders placed.  [name]. was informed that the remainder of the evaluation will be completed by another provider, this initial triage assessment does not replace that evaluation, and the importance of remaining in the ED until their evaluation is complete.     Faythe Ghee, PA-C 07/20/23 1334

## 2023-07-20 NOTE — ED Provider Notes (Signed)
 [facility] Provider Note    Event Date/Time   First MD Initiated Contact with Patient 07/20/23 1536     (approximate)   History   Shortness of Breath   HPI  Shane Frazier. is a 58 y.o. male who presents to the emergency department today because of concerns for shortness of breath.  Patient states that he has been short of breath for the past 2 to 3 weeks.  It is progressively getting worse.  It does seem to be worse with exertion.  It does somewhat wax and wane.  He denies any associated chest pain or pressure.  He has had a cough.  No fevers or chills.  Did have some sick contacts at work.  Patient does have a history of heart attack although states he has been on his medication for a number of months.     Physical Exam   Triage Vital Signs: ED Triage Vitals  Encounter Vitals Group     BP 07/20/23 1344 (!) 162/100     Systolic BP Percentile --      Diastolic BP Percentile --      Pulse Rate 07/20/23 1344 (!) 105     Resp 07/20/23 1344 (!) 22     Temp 07/20/23 1344 98.7 F (37.1 C)     Temp Source 07/20/23 1344 Oral     SpO2 07/20/23 1344 94 %     Weight 07/20/23 1337 240 lb (108.9 kg)     Height 07/20/23 1337 5\' 7"  (1.702 m)     Head Circumference --      Peak Flow --      Pain Score 07/20/23 1337 0     Pain Loc --      Pain Education --      Exclude from Growth Chart --     Most recent vital signs: Vitals:   07/20/23 1344  BP: (!) 162/100  Pulse: (!) 105  Resp: (!) 22  Temp: 98.7 F (37.1 C)  SpO2: 94%   General: Awake, alert, oriented. CV:  Good peripheral perfusion. Tachycardia. Resp:  Tachypnea, slightly increased work of breathing. Slight expiratory wheeze.  Abd:  No distention.  Other:  Pitting edema in lower extremities.    ED Results / Procedures / Treatments   Labs (all labs ordered are listed, but only abnormal results are displayed) Labs Reviewed  BASIC METABOLIC PANEL WITH GFR - Abnormal; Notable for the  following components:      Result Value   Glucose, Bld 249 (*)    Calcium 8.8 (*)    All other components within normal limits  BRAIN NATRIURETIC PEPTIDE - Abnormal; Notable for the following components:   B Natriuretic Peptide 749.1 (*)    All other components within normal limits  TROPONIN I (HIGH SENSITIVITY) - Abnormal; Notable for the following components:   Troponin I (High Sensitivity) 93 (*)    All other components within normal limits  TROPONIN I (HIGH SENSITIVITY) - Abnormal; Notable for the following components:   Troponin I (High Sensitivity) 89 (*)    All other components within normal limits  RESP PANEL BY RT-PCR (RSV, FLU A&B, COVID)  [id number]  CBC WITH DIFFERENTIAL/PLATELET  PROCALCITONIN  HEMOGLOBIN A1C  LIPID PANEL  MAGNESIUM  HIV ANTIBODY (ROUTINE TESTING W REFLEX)  BASIC METABOLIC PANEL WITH GFR  COMPREHENSIVE METABOLIC PANEL WITH GFR     EKG  I, Phineas Semen, attending physician, personally viewed and interpreted this EKG  EKG Time: 1615 Rate: 99 Rhythm: sinus rhythm Axis: left axis deviation Intervals: qtc 443 QRS: narrow, LVH ST changes: no st elevation Impression: abnormal ekg   RADIOLOGY I independently interpreted and visualized the CXR. My interpretation: No pneumonia Radiology interpretation:  IMPRESSION:  1. Confluent lower lobe opacity, which may represent atelectasis,  aspiration, or pneumonia.  2. Increased bilateral interstitial and perihilar opacities, likely  pulmonary edema.  3. Trace fluid along the fissures.      PROCEDURES:  Critical Care performed: Yes  CRITICAL CARE Performed by: Phineas Semen   Total critical care time: 30 minutes  Critical care time was exclusive of separately billable procedures and treating other patients.  Critical care was necessary to treat or prevent imminent or life-threatening deterioration.  Critical care was time spent personally by me on the following activities: development of  treatment plan with patient and/or surrogate as well as nursing, discussions with consultants, evaluation of patient's response to treatment, examination of patient, obtaining history from patient or surrogate, ordering and performing treatments and interventions, ordering and review of laboratory studies, ordering and review of radiographic studies, pulse oximetry and re-evaluation of patient's condition.   Procedures    MEDICATIONS ORDERED IN ED: Medications  ipratropium-albuterol (DUONEB) 0.5-2.5 (3) MG/3ML nebulizer solution 3 mL (3 mLs Nebulization Given 07/20/23 1425)     IMPRESSION / MDM / ASSESSMENT AND PLAN / ED COURSE  I reviewed the triage vital signs and the nursing notes.                              Differential diagnosis includes, patient was but is not limited to, CHF, pneumonia, PE, ACS, anemia  Patient's presentation is most consistent with acute presentation with potential threat to life or bodily function.   The patient is on the cardiac monitor to evaluate for evidence of arrhythmia and/or significant heart rate changes.   Patient presented to the emergency department today because of concerns for 2 to 3 weeks of progressive shortness of breath.  On exam patient does have some very mild expiratory wheeze.  Blood work with initial elevation of troponin.  Also had elevation of BNP.  Patient denied any chest pain.  Repeat troponin improved however essentially stable.  At this point do think this is likely patient's baseline or could be slightly worsened secondary to heart failure.  Chest x-ray is consistent with pulmonary edema.  I think this could explain the patient's shortness of breath.  Patient was ordered for IV Lasix.  Chest x-ray also raise concern for possible pneumonia however procalcitonin less than 0.1.  I have low concern for bacterial infection at this time.  Will hold off on antibiotics.  Discussed with Dr. Clyde Lundborg with the hospitalist service who will evaluate for  admission.     FINAL CLINICAL IMPRESSION(S) / ED DIAGNOSES   Final diagnoses:  Congestive heart failure, unspecified HF chronicity, unspecified heart failure type (HCC)  Elevated troponin  SOB (shortness of breath)      Note:  This document was prepared using Dragon voice recognition software and may include unintentional dictation errors.    Phineas Semen, MD 07/20/23 [phone]

## 2023-07-20 NOTE — ED Triage Notes (Addendum)
 Patient arrives POV c/o [facility] "for a couple of weeks." Patient denies hx of COPD and smoking. Patient reports hx of CHF. Patient reports coughing and spitting up mucous; on auscultation, patient has rhonchi lung sounds. Patient reports having a heart attack 1 year ago; states he is non-compliant with his medications.

## 2023-07-20 NOTE — Progress Notes (Signed)
 PHARMACIST - PHYSICIAN COMMUNICATION  CONCERNING:  Enoxaparin (Lovenox) for DVT Prophylaxis    RECOMMENDATION: Patient was prescribed enoxaprin 40mg  q24 hours for VTE prophylaxis.   Filed Weights   07/20/23 1337  Weight: 108.9 kg (240 lb)    Body mass index is 37.59 kg/m.  Estimated Creatinine Clearance: 88.2 mL/min (by C-G formula based on SCr of 1.1 mg/dL).   Based on [facility] policy patient is candidate for enoxaparin 0.5mg /kg TBW SQ every 24 hours based on BMI being >30.  DESCRIPTION: Pharmacy has adjusted enoxaparin dose per [facility] policy.  Patient is now receiving enoxaparin 55 mg every 24 hours    Merryl Hacker, PharmD Clinical Pharmacist  07/20/2023 7:29 PM

## 2023-07-20 NOTE — ED Notes (Signed)
 Patient transported to X-ray

## 2023-07-20 NOTE — H&P (Signed)
 History and Physical    Shane Frazier. ZOX:[id number] DOB: [birth year] DOA: 07/20/2023  Referring MD/NP/PA:   PCP: Smitty Cords, DO   Patient coming from:  The patient is coming from home.     Chief Complaint: SOB  HPI: Shane Frazier. is a 58 y.o. male with medical history significant of CAD, STEMI, s/p of DES, HTN, HLD, sCHF with EF 25-30%, hypothyroidism, depression, OSA on CPAP, obesity with BMI 37.59, brain trauma related seizure 2017 (not on seizure medication), migraine headache, alcohol use, medication noncompliance, who presents with SOB.  Patient states that he has not taken any medications for more than 6 months.  He developed shortness of breath in the past 2 weeks, which has been progressively worsening.  Patient has cough with white mucus production.  No chest pain, cough, SOB.  He has gained about 13 pounds of weight recently.  No nausea, vomiting, diarrhea or abdominal pain.  No symptoms of UTI.  Patient has bilateral leg edema.  Data reviewed independently and ED Course: pt was found to have BNP 749, WBC 5.8, GFR> 60, troponin 93, 89, procalcitonin <0.10.  Temperature normal, blood pressure 159/123, heart rate 105, 95, RR 22, 21, oxygen saturation 92-95% on room air.  Chest x-ray showed interstitial pulmonary edema.  Patient is admitted to telemetry bed as inpatient.   EKG: I have personally reviewed.  Sinus rhythm, QTc 443, LAE, LAD, poor R wave progression.  Review of Systems:   General: no fevers, chills, no body weight gain, has fatigue HEENT: no blurry vision, hearing changes or sore throat Respiratory: has dyspnea, coughing, no wheezing CV: no chest pain, no palpitations  GI: no nausea, vomiting, abdominal pain, diarrhea, constipation GU: no dysuria, burning on urination, increased urinary frequency, hematuria  Ext: has leg edema Neuro: no unilateral weakness, numbness, or tingling, no vision change or hearing loss Skin: no rash, no skin tear. MSK: No  muscle spasm, no deformity, no limitation of range of movement in spin Heme: No easy bruising.  Travel history: No recent long distant travel.   Allergy:  Allergies  Allergen Reactions   Other Other (See Comments)    BRAZILIAN NUTS ONLY-"THROAT CLOSES UP"    Past Medical History:  Diagnosis Date   CHF (congestive heart failure), NYHA class I, acute on chronic, systolic (HCC)    Concussion 2017   cause of seizure after hit head at work   Difficult intubation    patient unaware of this diagnosis   ED (erectile dysfunction)    Hypothyroidism    Seizures (HCC) 05-20-15   X 1-PT HIT HIS HEAD AT WORK AND THEN HAD A SEIZURE    Past Surgical History:  Procedure Laterality Date   COLONOSCOPY WITH PROPOFOL N/A 12/07/2018   Procedure: COLONOSCOPY WITH PROPOFOL;  Surgeon: Wyline Mood, MD;  Location: [facility] ENDOSCOPY;  Service: Gastroenterology;  Laterality: N/A;   CORONARY/GRAFT ACUTE MI REVASCULARIZATION N/A 08/09/2022   Procedure: Coronary/Graft Acute MI Revascularization;  Surgeon: Yvonne Kendall, MD;  Location: [facility] INVASIVE CV LAB;  Service: Cardiovascular;  Laterality: N/A;   FLEXIBLE BRONCHOSCOPY N/A 03/27/2018   Procedure: FLEXIBLE BRONCHOSCOPY With [facility] With C-arm;  Surgeon: Merwyn Katos, MD;  Location: [facility] ORS;  Service: Pulmonary;  Laterality: N/A;   HERNIA REPAIR     as a child,umbilical   LEFT HEART CATH AND CORONARY ANGIOGRAPHY N/A 08/09/2022   Procedure: LEFT HEART CATH AND CORONARY ANGIOGRAPHY;  Surgeon: Yvonne Kendall, MD;  Location: [facility] INVASIVE CV LAB;  Service: Cardiovascular;  Laterality: N/A;   SHOULDER ARTHROSCOPY WITH ROTATOR CUFF REPAIR AND SUBACROMIAL DECOMPRESSION Left 09/09/2015   Procedure: SHOULDER ARTHROSCOPY, SUBACROMIAL DECOMPRESSION,BICEPS TENSON RELEASE AND MINI OPEN ROTATOR CUFF REPAIR;  Surgeon: Erin Sons, MD;  Location: [facility] ORS;  Service: Orthopedics;  Laterality: Left;   VENTRAL HERNIA REPAIR N/A 10/27/2017   Procedure: HERNIA REPAIR  VENTRAL ADULT;  Surgeon: Leafy Ro, MD;  Location: [facility] ORS;  Service: General;  Laterality: N/A;    Social History:  reports that he has never smoked. His smokeless tobacco use includes chew. He reports current alcohol use of about 8.0 standard drinks of alcohol per week. He reports that he does not use drugs.  Family History:  Family History  Problem Relation Age of Onset   Hypertension Mother    Cancer Father    Diabetes Sister    Thyroid cancer Sister      Prior to Admission medications   Medication Sig Start Date End Date Taking? Authorizing Provider  aspirin 81 MG chewable tablet Chew 1 tablet (81 mg total) by mouth daily. 08/13/22   Amin, Ankit C, MD  atorvastatin (LIPITOR) 80 MG tablet Take 1 tablet (80 mg total) by mouth at bedtime. 08/12/22   Amin, Ankit C, MD  carvedilol (COREG) 3.125 MG tablet Take 3.125 mg by mouth 2 (two) times daily with a meal.    [provider]  dapagliflozin propanediol (FARXIGA) 10 MG TABS tablet Take 1 tablet (10 mg total) by mouth daily. 08/13/22   Amin, Ankit C, MD  folic acid (FOLVITE) 1 MG tablet Take 1 tablet (1 mg total) by mouth daily. 08/13/22   Amin, Ankit C, MD  furosemide (LASIX) 40 MG tablet Take 1 tablet (40 mg total) by mouth daily as needed for edema or fluid. 08/12/22   Miguel Rota, MD  levothyroxine (SYNTHROID) 175 MCG tablet Take 1 tablet (175 mcg total) by mouth daily before breakfast. 02/07/22   Karamalegos, Netta Neat, DO  metFORMIN (GLUCOPHAGE) 500 MG tablet TAKE 1 TABLET (500 MG TOTAL) BY MOUTH 2 (TWO) TIMES DAILY WITH A MEAL. 10/12/20   Karamalegos, Netta Neat, DO  Multiple Vitamin (MULTIVITAMIN WITH MINERALS) TABS tablet Take 1 tablet by mouth daily. Patient not taking: Reported on 09/20/2022 08/13/22   Miguel Rota, MD  sacubitril-valsartan (ENTRESTO) 97-103 MG Take 1 tablet by mouth 2 (two) times daily. 08/15/22   Sabharwal, Aditya, DO  spironolactone (ALDACTONE) 25 MG tablet Take 0.5 tablets (12.5 mg total) by mouth  daily. 08/13/22   Amin, Ankit C, MD  thiamine (VITAMIN B-1) 100 MG tablet Take 1 tablet (100 mg total) by mouth daily. 08/13/22   Amin, Ankit C, MD  ticagrelor (BRILINTA) 90 MG TABS tablet Take 90 mg by mouth 2 (two) times daily.    [provider]    Physical Exam: Vitals:   07/20/23 1755 07/20/23 1800 07/20/23 1902 07/20/23 1939  BP: (!) 147/126 (!) 158/123  (!) 158/123  Pulse: (!) 103 95  (!) 101  Resp: 14 (!) 21    Temp:   98.6 F (37 C)   TempSrc:   Oral   SpO2: 95% 92%    Weight:      Height:       General: Not in acute distress HEENT:       Eyes: PERRL, EOMI, no jaundice       ENT: No discharge from the ears and nose, no pharynx injection, no tonsillar enlargement.  Neck: positive JVD, no bruit, no mass felt. Heme: No neck lymph node enlargement. Cardiac: S1/S2, RRR, No murmurs, No gallops or rubs. Respiratory: Has fine crackles bilaterally GI: Soft, nondistended, nontender, no rebound pain, no organomegaly, BS present. GU: No hematuria Ext: 1+ pitting leg edema bilaterally. 1+DP/PT pulse bilaterally. Musculoskeletal: No joint deformities, No joint redness or warmth, no limitation of ROM in spin. Skin: No rashes.  Neuro: Alert, oriented X3, cranial nerves II-XII grossly intact, moves all extremities normally.  Psych: Patient is not psychotic, no suicidal or hemocidal ideation.  Labs on Admission: I have personally reviewed following labs and imaging studies  CBC: Recent Labs  Lab 07/20/23 1415  WBC 5.8  NEUTROABS 3.8  HGB 14.2  HCT 42.1  MCV 89.4  PLT 248   Basic Metabolic Panel: Recent Labs  Lab 07/20/23 1415 07/20/23 1733  NA 141 140  K 3.7 3.9  CL 105 103  CO2 28 28  GLUCOSE 249* 271*  BUN 18 17  CREATININE 1.10 1.02  CALCIUM 8.8* 8.8*  MG  --  1.7   GFR: Estimated Creatinine Clearance: 95.2 mL/min (by C-G formula based on SCr of 1.02 mg/dL). Liver Function Tests: Recent Labs  Lab 07/20/23 1733  AST 25  ALT 27  ALKPHOS 70   BILITOT 0.8  PROT 6.8  ALBUMIN 3.3*   No results for input(s): "LIPASE", "AMYLASE" in the last 168 hours. No results for input(s): "AMMONIA" in the last 168 hours. Coagulation Profile: No results for input(s): "INR", "PROTIME" in the last 168 hours. Cardiac Enzymes: No results for input(s): "CKTOTAL", "CKMB", "CKMBINDEX", "TROPONINI" in the last 168 hours. BNP (last 3 results) No results for input(s): "PROBNP" in the last 8760 hours. HbA1C: No results for input(s): "HGBA1C" in the last 72 hours. CBG: No results for input(s): "GLUCAP" in the last 168 hours. Lipid Profile: No results for input(s): "CHOL", "HDL", "LDLCALC", "TRIG", "CHOLHDL", "LDLDIRECT" in the last 72 hours. Thyroid Function Tests: No results for input(s): "TSH", "T4TOTAL", "FREET4", "T3FREE", "THYROIDAB" in the last 72 hours. Anemia Panel: No results for input(s): "VITAMINB12", "FOLATE", "FERRITIN", "TIBC", "IRON", "RETICCTPCT" in the last 72 hours. Urine analysis:    Component Value Date/Time   COLORURINE STRAW (A) 10/01/2017 1214   APPEARANCEUR CLEAR (A) 10/01/2017 1214   LABSPEC 1.013 10/01/2017 1214   PHURINE 7.0 10/01/2017 1214   GLUCOSEU 150 (A) 10/01/2017 1214   HGBUR NEGATIVE 10/01/2017 1214   BILIRUBINUR negative 09/11/2019 1013   KETONESUR NEGATIVE 10/01/2017 1214   PROTEINUR Negative 09/11/2019 1013   PROTEINUR 30 (A) 10/01/2017 1214   UROBILINOGEN 0.2 09/11/2019 1013   NITRITE negative 09/11/2019 1013   NITRITE NEGATIVE 10/01/2017 1214   LEUKOCYTESUR Negative 09/11/2019 1013   Sepsis Labs: @LABRCNTIP (procalcitonin:4,lacticidven:4) ) Recent Results (from the past 240 hours)  Resp panel by RT-PCR (RSV, Flu A&B, Covid) Anterior Nasal Swab     Status: None   Collection Time: 07/20/23 12:42 PM   Specimen: Anterior Nasal Swab  Result Value Ref Range Status   SARS Coronavirus 2 by RT PCR NEGATIVE NEGATIVE Final    Comment: (NOTE) SARS-CoV-2 target nucleic acids are NOT DETECTED.  The  SARS-CoV-2 RNA is generally detectable in upper respiratory specimens during the acute phase of infection. The lowest concentration of SARS-CoV-2 viral copies this assay can detect is 138 copies/mL. A negative result does not preclude SARS-Cov-2 infection and should not be used as the sole basis for treatment or other patient management decisions. A negative result may occur with  improper  specimen collection/handling, submission of specimen other than nasopharyngeal swab, presence of viral mutation(s) within the areas targeted by this assay, and inadequate number of viral copies(<138 copies/mL). A negative result must be combined with clinical observations, patient history, and epidemiological information. The expected result is Negative.  Fact Sheet for Patients:  BloggerCourse.com  Fact Sheet for Healthcare Providers:  SeriousBroker.it  This test is no t yet approved or cleared by the [place] FDA and  has been authorized for detection and/or diagnosis of SARS-CoV-2 by FDA under an Emergency Use Authorization (EUA). This EUA will remain  in effect (meaning this test can be used) for the duration of the COVID-19 declaration under Section 564(b)(1) of the Act, 21 U.S.C.section 360bbb-3(b)(1), unless the authorization is terminated  or revoked sooner.       Influenza A by PCR NEGATIVE NEGATIVE Final   Influenza B by PCR NEGATIVE NEGATIVE Final    Comment: (NOTE) The Xpert Xpress SARS-CoV-2/FLU/RSV plus assay is intended as an aid in the diagnosis of influenza from Nasopharyngeal swab specimens and should not be used as a sole basis for treatment. Nasal washings and aspirates are unacceptable for Xpert Xpress SARS-CoV-2/FLU/RSV testing.  Fact Sheet for Patients: BloggerCourse.com  Fact Sheet for Healthcare Providers: SeriousBroker.it  This test is not yet approved or  cleared by the [place] FDA and has been authorized for detection and/or diagnosis of SARS-CoV-2 by FDA under an Emergency Use Authorization (EUA). This EUA will remain in effect (meaning this test can be used) for the duration of the COVID-19 declaration under Section 564(b)(1) of the Act, 21 U.S.C. section 360bbb-3(b)(1), unless the authorization is terminated or revoked.     Resp Syncytial Virus by PCR NEGATIVE NEGATIVE Final    Comment: (NOTE) Fact Sheet for Patients: BloggerCourse.com  Fact Sheet for Healthcare Providers: SeriousBroker.it  This test is not yet approved or cleared by the [place] FDA and has been authorized for detection and/or diagnosis of SARS-CoV-2 by FDA under an Emergency Use Authorization (EUA). This EUA will remain in effect (meaning this test can be used) for the duration of the COVID-19 declaration under Section 564(b)(1) of the Act, 21 U.S.C. section 360bbb-3(b)(1), unless the authorization is terminated or revoked.  Performed at [facility], [address] [place], [place] [address]      Radiological Exams on Admission:   Assessment/Plan Principal Problem:   Acute on chronic combined systolic and diastolic heart failure (HCC) Active Problems:   CAD (coronary artery disease)   Essential hypertension   Type 2 diabetes mellitus with diabetic retinopathy (HCC)   Hypothyroidism   HLD (hyperlipidemia)   Alcohol use   OSA on CPAP   Obesity (BMI 30-39.9)   Assessment and Plan:  Acute on chronic combined systolic and diastolic heart failure [facility]): Patient has SOB, leg edema, elevated BNP 749, interstitial pulm edema chest x-ray, clinically consistent with CHF exacerbation.  This is due to medication noncompliance.  -Will admit to tele bed as inpatient -Lasix 40 mg bid by IV - restart spironolactone 25 mg daily -Restart Entresto -2d echo -Daily weights -strict  I/O's -Low salt diet -Fluid restriction -As needed bronchodilators for shortness of breath  Myocardial injury and history of CAD (coronary artery disease): hx of STEMI, s/p of DES.  Troponin 93 --> 89.  No chest pain, likely demand ischemia. -Restart aspirin 81 mg daily and Brilinta -Restart Lipitor and Coreg -check A1c and FLP  Essential hypertension -Coreg, Entresto -IV hydralazine as needed  Type 2  diabetes mellitus with diabetic retinopathy (HCC): A1c 10.3 recently, poorly controlled.  Patient is supposed to take metformin and Farxig, but not compliant -SSI  Hypothyroidism -Restart Synthroid 175 mcg daily -Check TSH --> may need to make adjustment depending on TSH level  HLD (hyperlipidemia) -Lipitor  Alcohol use -Did counseling about importance of quitting alcohol use -CIWA protocol   OSA - on CPAP   Obesity (BMI 30-39.9): Body weight while 8.9 kg, BMI 37.59 -Encouraged losing weight -Exercise and healthy diet      DVT ppx:  SQ Lovenox  Code Status: Full code   Family Communication:     not done, no family member is at bed side.   Disposition Plan:  Anticipate discharge back to previous environment  Consults called:  none  Admission status and Level of care: Telemetry Cardiac:  as inpt        Dispo: The patient is from: Home              Anticipated d/c is to: Home              Anticipated d/c date is: 2 days              Patient currently is not medically stable to d/c.    Severity of Illness:  The appropriate patient status for this patient is INPATIENT. Inpatient status is judged to be reasonable and necessary in order to provide the required intensity of service to ensure the patient's safety. The patient's presenting symptoms, physical exam findings, and initial radiographic and laboratory data in the context of their chronic comorbidities is felt to place them at high risk for further clinical deterioration. Furthermore, it is not anticipated  that the patient will be medically stable for discharge from the hospital within 2 midnights of admission.   * I certify that at the point of admission it is my clinical judgment that the patient will require inpatient hospital care spanning beyond 2 midnights from the point of admission due to high intensity of service, high risk for further deterioration and high frequency of surveillance required.*       Date of Service 07/20/2023    Lorretta Harp [facility]   If 7PM-7AM, please contact night-coverage [email or web address] 07/20/2023, 8:21 PM

## 2023-07-21 ENCOUNTER — Other Ambulatory Visit (HOSPITAL_COMMUNITY): Payer: Self-pay

## 2023-07-21 ENCOUNTER — Telehealth (HOSPITAL_COMMUNITY): Payer: Self-pay

## 2023-07-21 ENCOUNTER — Inpatient Hospital Stay (HOSPITAL_COMMUNITY)
Admit: 2023-07-21 | Discharge: 2023-07-21 | Disposition: A | Discharge status: Home / Self Care | Attending: Internal Medicine | Admitting: Internal Medicine

## 2023-07-21 DIAGNOSIS — I5043 Acute on chronic combined systolic (congestive) and diastolic (congestive) heart failure: Secondary | ICD-10-CM

## 2023-07-21 LAB — BASIC METABOLIC PANEL WITH GFR
Anion gap: 12 (ref 5–15)
BUN: 14 mg/dL (ref 6–20)
CO2: 27 mmol/L (ref 22–32)
Calcium: 9.1 mg/dL (ref 8.9–10.3)
Chloride: 100 mmol/L (ref 98–111)
Creatinine, Ser: 0.9 mg/dL (ref 0.61–1.24)
GFR, Estimated: >60 mL/min (ref >60)
Glucose, Bld: 135 mg/dL — ABNORMAL HIGH (ref 70–99)
Potassium: 3 mmol/L — ABNORMAL LOW (ref 3.5–5.1)
Sodium: 139 mmol/L (ref 135–145)

## 2023-07-21 LAB — CBG MONITORING, ED
Glucose-Capillary: 219 mg/dL — ABNORMAL HIGH (ref 70–99)
Glucose-Capillary: 279 mg/dL — ABNORMAL HIGH (ref 70–99)

## 2023-07-21 LAB — GLUCOSE, CAPILLARY
Glucose-Capillary: 260 mg/dL — ABNORMAL HIGH (ref 70–99)
Glucose-Capillary: 357 mg/dL — ABNORMAL HIGH (ref 70–99)

## 2023-07-21 LAB — LIPID PANEL
Cholesterol: 241 mg/dL — ABNORMAL HIGH (ref 0–200)
HDL: 77 mg/dL (ref >40)
LDL Cholesterol: 144 mg/dL — ABNORMAL HIGH (ref 0–99)
Total CHOL/HDL Ratio: 3.1 ratio
Triglycerides: 100 mg/dL (ref <150)
VLDL: 20 mg/dL (ref 0–40)

## 2023-07-21 LAB — HIV ANTIBODY (ROUTINE TESTING W REFLEX): HIV Screen 4th Generation wRfx: NONREACTIVE

## 2023-07-21 MED ORDER — PERFLUTREN LIPID MICROSPHERE
1.0000 mL | INTRAVENOUS | Status: AC | PRN
  Administered 2023-07-21: 5 mL via INTRAVENOUS

## 2023-07-21 MED ORDER — TICAGRELOR 90 MG PO TABS
90.0000 mg | ORAL_TABLET | Freq: Two times a day (BID) | ORAL | Status: DC
  Administered 2023-07-21 – 2023-07-22 (×2): 90 mg via ORAL
  Filled 2023-07-21 (×2): qty 1

## 2023-07-21 MED ORDER — POTASSIUM CHLORIDE CRYS ER 20 MEQ PO TBCR
40.0000 meq | EXTENDED_RELEASE_TABLET | Freq: Two times a day (BID) | ORAL | Status: AC
  Administered 2023-07-21 (×2): 40 meq via ORAL
  Filled 2023-07-21: qty 2
  Filled 2023-07-21: qty 4

## 2023-07-21 NOTE — ED Notes (Signed)
 Pt given slip socks

## 2023-07-21 NOTE — ED Notes (Signed)
 Pt given bath supplies, and incentive spirometer with teachback.

## 2023-07-21 NOTE — ED Notes (Signed)
 CCMD notified of cardiac monitoring

## 2023-07-21 NOTE — Telephone Encounter (Signed)
 Pharmacy Patient Advocate Encounter  Insurance verification completed.    The patient is insured through [employer] and [employer].     Ran test claim for Jardiance and the current 30 day co-pay is $4.00.  Ran test claim for Entresto and the current 30 day co-pay is $4.00.  Ran test claim for Marcelline Deist and the current 30 day co-pay is $4.00.  This test claim was processed through [facility]- copay amounts may vary at other pharmacies due to pharmacy/plan contracts, or as the patient moves through the different stages of their insurance plan.

## 2023-07-21 NOTE — Progress Notes (Signed)
 Heart Failure Stewardship Pharmacy Note  PCP: Smitty Cords, DO PCP-Cardiologist: None  HPI: Shane Frazier. is a 58 y.o. male with CAD with STEMI s/p of DES, HTN, HLD, sCHF with EF 25-30%, hypothyroidism, depression, OSA on CPAP, obesity with BMI 37.59, brain trauma related seizure 2017 (not on seizure medication), migraine headache, alcohol use who presented with progressive shortness of breath over the past 2 weeks. On admission, BNP was 749.1, HS-troponin was 93 >89, and TSH was 25.444. Chest x-ray noted likely pulmonary edema along with lower lobe opacity.   Pertinent cardiac history: Echo in 03/2018 with LVEF 40-45%. STEMI 06/2022 with thrombotic occlusion of the distal LAD treated with DES. Noted at that time to have severely reduced LVEF (25-35%), LV dysfunction out of proportion to CAD, and high filling pressures. Echo 07/2022 with LVEF 25-30%, grade I diastolc dysfunction. CMRI 07/2022 with LVEF 24%, LGE in apex of LV and septal midwall, RVEF 29%, suggestive of mixed ischemic and nonischemic cardiomyopathy.  Pertinent Lab Values: Creat  Date Value Ref Range Status  01/31/2022 1.06 0.70 - 1.30 mg/dL Final   Creatinine, Ser  Date Value Ref Range Status  07/21/2023 0.90 0.61 - 1.24 mg/dL Final   BUN  Date Value Ref Range Status  07/21/2023 14 6 - 20 mg/dL Final  16/01/9603 17 4 - 21 Final   Potassium  Date Value Ref Range Status  07/21/2023 3.0 (L) 3.5 - 5.1 mmol/L Final   Sodium  Date Value Ref Range Status  07/21/2023 139 135 - 145 mmol/L Final  10/17/2017 141 137 - 147 Final   B Natriuretic Peptide  Date Value Ref Range Status  07/20/2023 749.1 (H) 0.0 - 100.0 pg/mL Final    Comment:    Performed at [facility], [address] [place], [place] [address]   Magnesium  Date Value Ref Range Status  07/20/2023 1.7 1.7 - 2.4 mg/dL Final    Comment:    Performed at [facility], [address] [place], [place] [address]   Hemoglobin  A1C  Date Value Ref Range Status  10/17/2017 9.8  Final   Hgb A1c MFr Bld  Date Value Ref Range Status  08/09/2022 10.3 (H) 4.8 - 5.6 % Final    Comment:    (NOTE)         Prediabetes: 5.7 - 6.4         Diabetes: >6.4         Glycemic control for adults with diabetes: <7.0    TSH  Date Value Ref Range Status  07/20/2023 25.444 (H) 0.350 - 4.500 uIU/mL Final    Comment:    Performed by a 3rd Generation assay with a functional sensitivity of <=0.01 uIU/mL. Performed at [facility], [address] [place], [place] [address]   01/31/2022 35.00 (H) 0.40 - 4.50 mIU/L Final    Vital Signs: Temp:  [97.9 F (36.6 C)-98.7 F (37.1 C)] 98.5 F (36.9 C) (04/04 0730) Pulse Rate:  [83-106] 83 (04/04 0621) Cardiac Rhythm: Normal sinus rhythm (04/04 0319) Resp:  [14-36] 36 (04/04 0600) BP: (114-166)/(80-126) 138/96 (04/04 0621) SpO2:  [92 %-96 %] 96 % (04/04 0600) Weight:  [108.9 kg (240 lb)] 108.9 kg (240 lb) (04/03 1337)  Intake/Output Summary (Last 24 hours) at 07/21/2023 0732 Last data filed at 07/21/2023 0627 Gross per 24 hour  Intake 8 ml  Output 4850 ml  Net -4842 ml    Current Heart Failure Medications:  Loop diuretic: furosemide 40  mg IV q12h Beta-Blocker: carvedilol 3.125 mg BID ACEI/ARB/ARNI: Entresto 97/103 mg BID MRA: spironolactone 12.5 mg daily SGLT2i: none  Prior to admission Heart Failure Medications:  Has been off medications for nearly a year  Assessment: 1. Acute on chronic combined systolic and diastolic heart failure (LVEF not measured this admission, most recently 24%) with RV dysfunction, due to mixed ischemic and nonischemic cardiomyopathy. NYHA class III-IV symptoms.  -Symptoms: Patient has had orthopnea, dyspnea on minimal exertion, and decreased appetite worsening over the past 2 weeks. Symptoms have improved since diuresis was started. Appetite has picked up significantly. LEE has vastly improved. -Volume: Good urine output so far on  furosemide 40 mg IV q12h. Remains hypervolemic. Continue diuresis. -Hemodynamics: BP markedly elevated on admission, trending down with restarting GDMT. -BB: Continue carvedilol 3.125 mg BID. Can consider increasing when euvolemic. -ACEI/ARB/ARNI: Entresto 97-103 mg BID restarted. Would consider decreasing to 49-51 mg BID given naivety to the medication given that patient has been off this for ~10 months and may become hypotensive at steady state. -MRA: Continue spironolactone 12.4 mg daily -SGLT2i: Consider adding Farxiga 10 mg daily if A1c <10 -TSH extremely elevated, suspect this is to being off Synthroid for so long. Could consider checking free T4.  Plan: 1) Medication changes recommended at this time: -Monitor response to Entresto to 97-103 mg BID given patient has been off the medication for an extended time. May require dose reduction if BP drops. -Consider adding Farxiga 10 mg daily if A1c is <10  2) Patient assistance: Medications are all $4  3) Education: - Patient has been educated on current HF medications and potential additions to HF medication regimen - Patient verbalizes understanding that over the next few months, these medication doses may change and more medications may be added to optimize HF regimen - Patient has been educated on basic disease state pathophysiology and goals of therapy  Medication Assistance / Insurance Benefits Check: Does the patient have prescription insurance?    Type of insurance plan:  Does the patient qualify for medication assistance through manufacturers or grants? No   Outpatient Pharmacy: Prior to admission outpatient pharmacy: None      Please do not hesitate to reach out with questions or concerns,  Enos Fling, PharmD, CPP, BCPS Heart Failure Pharmacist  Phone - [phone] 07/21/2023 11:36 AM

## 2023-07-21 NOTE — ED Notes (Signed)
 Patient in room listening/watching videos on phone. No distress noted. Will continue to monitor.

## 2023-07-21 NOTE — ED Notes (Signed)
 Rounding completed. Patient in room in no distress and playing on phone. VSS. Will continue to monitor.

## 2023-07-21 NOTE — ED Notes (Signed)
 Pt ordered a salad from [employer] and eating a chicken cobb salad at bedside.

## 2023-07-21 NOTE — ED Notes (Signed)
 Fall risk reassessed on Pt, Pt is able to ambulate on own with 1 standby.

## 2023-07-21 NOTE — ED Notes (Addendum)
 Pt dressed in a gown, declines need for PRN breathing treatment at this time.

## 2023-07-21 NOTE — TOC Progression Note (Signed)
 Transition of Care (TOC) - Progression Note    Patient Details  Name: Shane Frazier. MRN: [id number] Date of Birth: 06/08/1965  Transition of Care [facility]) CM/SW Contact  Colin Broach, LCSW Phone Number: 07/21/2023, 2:18 PM  Clinical Narrative:    CSW visited patient to discuss consult.  There was a note on the door saying "do not disturb". Patient has a difficult airway.  When able, TOC to follow up to see if patient interested in SA consult information.        Expected Discharge Plan and Services                                               Social Determinants of Health (SDOH) Interventions SDOH Screenings   Food Insecurity: Food Insecurity Present (08/10/2022)  Housing: High Risk (08/10/2022)  Transportation Needs: Unmet Transportation Needs (08/10/2022)  Utilities: At Risk (08/10/2022)  Alcohol Screen: High Risk (07/20/2023)  Depression (PHQ2-9): Low Risk  (08/19/2022)  Tobacco Use: High Risk (07/20/2023)    Readmission Risk Interventions     No data to display

## 2023-07-21 NOTE — ED Notes (Signed)
 Blue top sent down to lab with "save tube label"

## 2023-07-21 NOTE — Progress Notes (Signed)
*  PRELIMINARY RESULTS* Echocardiogram 2D Echocardiogram has been performed.  Shane Frazier 07/21/2023, 4:10 PM

## 2023-07-21 NOTE — Plan of Care (Signed)

## 2023-07-21 NOTE — Progress Notes (Signed)
 PROGRESS NOTE    Shane Frazier.  EAV:[id number] DOB: 18-Apr-1966 DOA: 07/20/2023 PCP: Smitty Cords, DO    Assessment & Plan:   Principal Problem:   Acute on chronic combined systolic and diastolic heart failure (HCC) Active Problems:   CAD (coronary artery disease)   Essential hypertension   Type 2 diabetes mellitus with diabetic retinopathy (HCC)   Hypothyroidism   HLD (hyperlipidemia)   Alcohol use   OSA on CPAP   Obesity (BMI 30-39.9)  Assessment and Plan:  Acute on chronic combined CHF: has SOB, leg edema, elevated BNP 749, interstitial pulm edema chest x-ray, clinically consistent with CHF exacerbation. Secondary medication noncompliance as pt said he was going through a divorce. Continue on IV lasix, aldactone, entresto, core. Monitor I/Os and daily weights. Echo ordered   Myocardial injury: w/ elevated troponins, likely secondary to demand ischemia. DHx of CAD &  STEMI, s/p of DES. Continue on statin, coreg & brilinta   Hypokalemia: potassium given    HTN: continue on coreg, entresto. IV hydralazine prn    DM2: HbA1c 10.3, poorly controlled. Continue on SSI w/ accuchecks    Hypothyroidism: continue on home dose of levothyroxine    HLD: continue on statin    Alcohol use: continue on CIWA protocol. Received alcohol cessation counseling already    OSA: continue on CPAP   Obesity: BMI 37.5. Would benefit from weight loss          DVT prophylaxis: lovenox  Code Status: full  Family Communication: Disposition Plan: likely d/c back home  Status is: Inpatient Remains inpatient appropriate because: severity of illness, requiring IV lasix     Level of care: Telemetry Cardiac Consultants:    Procedures:  Antimicrobials:    Subjective: Pt c/o malaise   Objective: Vitals:   07/21/23 0400 07/21/23 0600 07/21/23 0621 07/21/23 0730  BP: (!) 126/94 (!) 138/96 (!) 138/96   Pulse: 91 83 83   Resp: (!) 23 (!) 36    Temp:    98.5 F (36.9 C)   TempSrc:    Oral  SpO2: 93% 96%    Weight:      Height:        Intake/Output Summary (Last 24 hours) at 07/21/2023 0842 Last data filed at 07/21/2023 7829 Gross per 24 hour  Intake 8 ml  Output 4850 ml  Net -4842 ml   Filed Weights   07/20/23 1337  Weight: 108.9 kg    Examination:  General exam: Appears calm and comfortable  Respiratory system: decreased breath sounds b/l  Cardiovascular system: S1 & S2+. N obese, soft and nontender. Normal bowel sounds heard. Central nervous system: Alert and oriented. Moves all extremities  Psychiatry: Judgement and insight appear normal. Mood & affect appropriate.     Data Reviewed: I have personally reviewed following labs and imaging studies  CBC: Recent Labs  Lab 07/20/23 1415  WBC 5.8  NEUTROABS 3.8  HGB 14.2  HCT 42.1  MCV 89.4  PLT 248   Basic Metabolic Panel: Recent Labs  Lab 07/20/23 1415 07/20/23 1733 07/21/23 0336  NA 141 140 139  K 3.7 3.9 3.0*  CL 105 103 100  CO2 28 28 27   GLUCOSE 249* 271* 135*  BUN 18 17 14   CREATININE 1.10 1.02 0.90  CALCIUM 8.8* 8.8* 9.1  MG  --  1.7  --    GFR: Estimated Creatinine Clearance: 107.9 mL/min (by C-G formula based on SCr of 0.9 mg/dL). Liver Function Tests: Recent Labs  Lab 07/20/23 1733  AST 25  ALT 27  ALKPHOS 70  BILITOT 0.8  PROT 6.8  ALBUMIN 3.3*   No results for input(s): "LIPASE", "AMYLASE" in the last 168 hours. No results for input(s): "AMMONIA" in the last 168 hours. Coagulation Profile: No results for input(s): "INR", "PROTIME" in the last 168 hours. Cardiac Enzymes: No results for input(s): "CKTOTAL", "CKMB", "CKMBINDEX", "TROPONINI" in the last 168 hours. BNP (last 3 results) No results for input(s): "PROBNP" in the last 8760 hours. HbA1C: No results for input(s): "HGBA1C" in the last 72 hours. CBG: Recent Labs  Lab 07/20/23 2132 07/21/23 0729  GLUCAP 271* 219*   Lipid Profile: Recent Labs    07/21/23 0336  CHOL 241*  HDL 77   LDLCALC 144*  TRIG 100  CHOLHDL 3.1   Thyroid Function Tests: Recent Labs    07/20/23 1415  TSH 25.444*   Anemia Panel: No results for input(s): "VITAMINB12", "FOLATE", "FERRITIN", "TIBC", "IRON", "RETICCTPCT" in the last 72 hours. Sepsis Labs: Recent Labs  Lab 07/20/23 1415  PROCALCITON <0.10    Recent Results (from the past 240 hours)  Resp panel by RT-PCR (RSV, Flu A&B, Covid) Anterior Nasal Swab     Status: None   Collection Time: 07/20/23 12:42 PM   Specimen: Anterior Nasal Swab  Result Value Ref Range Status   SARS Coronavirus 2 by RT PCR NEGATIVE NEGATIVE Final    Comment: (NOTE) SARS-CoV-2 target nucleic acids are NOT DETECTED.  The SARS-CoV-2 RNA is generally detectable in upper respiratory specimens during the acute phase of infection. The lowest concentration of SARS-CoV-2 viral copies this assay can detect is 138 copies/mL. A negative result does not preclude SARS-Cov-2 infection and should not be used as the sole basis for treatment or other patient management decisions. A negative result may occur with  improper specimen collection/handling, submission of specimen other than nasopharyngeal swab, presence of viral mutation(s) within the areas targeted by this assay, and inadequate number of viral copies(<138 copies/mL). A negative result must be combined with clinical observations, patient history, and epidemiological information. The expected result is Negative.  Fact Sheet for Patients:  BloggerCourse.com  Fact Sheet for Healthcare Providers:  SeriousBroker.it  This test is no t yet approved or cleared by the [place] FDA and  has been authorized for detection and/or diagnosis of SARS-CoV-2 by FDA under an Emergency Use Authorization (EUA). This EUA will remain  in effect (meaning this test can be used) for the duration of the COVID-19 declaration under Section 564(b)(1) of the Act,  21 U.S.C.section 360bbb-3(b)(1), unless the authorization is terminated  or revoked sooner.       Influenza A by PCR NEGATIVE NEGATIVE Final   Influenza B by PCR NEGATIVE NEGATIVE Final    Comment: (NOTE) The Xpert Xpress SARS-CoV-2/FLU/RSV plus assay is intended as an aid in the diagnosis of influenza from Nasopharyngeal swab specimens and should not be used as a sole basis for treatment. Nasal washings and aspirates are unacceptable for Xpert Xpress SARS-CoV-2/FLU/RSV testing.  Fact Sheet for Patients: BloggerCourse.com  Fact Sheet for Healthcare Providers: SeriousBroker.it  This test is not yet approved or cleared by the [place] FDA and has been authorized for detection and/or diagnosis of SARS-CoV-2 by FDA under an Emergency Use Authorization (EUA). This EUA will remain in effect (meaning this test can be used) for the duration of the COVID-19 declaration under Section 564(b)(1) of the Act, 21 U.S.C. section 360bbb-3(b)(1), unless the authorization is terminated or revoked.  Resp Syncytial Virus by PCR NEGATIVE NEGATIVE Final    Comment: (NOTE) Fact Sheet for Patients: BloggerCourse.com  Fact Sheet for Healthcare Providers: SeriousBroker.it  This test is not yet approved or cleared by the [place] FDA and has been authorized for detection and/or diagnosis of SARS-CoV-2 by FDA under an Emergency Use Authorization (EUA). This EUA will remain in effect (meaning this test can be used) for the duration of the COVID-19 declaration under Section 564(b)(1) of the Act, 21 U.S.C. section 360bbb-3(b)(1), unless the authorization is terminated or revoked.  Performed at [facility], [address], [place], [place] [address]          Radiology Studies: DG Chest 2 View Result Date: 07/20/2023 CLINICAL DATA:  Shortness of breath EXAM: CHEST - 2 VIEW  COMPARISON:  Chest radiograph dated 08/09/2022 FINDINGS: Normal lung volumes. Confluent lower lobe opacity. Increased bilateral interstitial and perihilar opacities. Thickening of the fissures. No pneumothorax. Similar enlarged cardiomediastinal silhouette. No acute osseous abnormality. IMPRESSION: 1. Confluent lower lobe opacity, which may represent atelectasis, aspiration, or pneumonia. 2. Increased bilateral interstitial and perihilar opacities, likely pulmonary edema. 3. Trace fluid along the fissures. Electronically Signed   By: Agustin Cree M.D.   On: 07/20/2023 14:55        Scheduled Meds:  aspirin  81 mg Oral Daily   atorvastatin  80 mg Oral QHS   carvedilol  3.125 mg Oral BID WC   enoxaparin (LOVENOX) injection  55 mg Subcutaneous Q24H   folic acid  1 mg Oral Daily   furosemide  40 mg Intravenous Q12H   insulin aspart  0-5 Units Subcutaneous QHS   insulin aspart  0-9 Units Subcutaneous TID WC   levothyroxine  175 mcg Oral QAC breakfast   LORazepam  0-4 mg Intravenous Q6H   Followed by   Melene Muller ON 07/22/2023] LORazepam  0-4 mg Intravenous Q12H   multivitamin with minerals  1 tablet Oral Daily   potassium chloride  40 mEq Oral BID   sacubitril-valsartan  1 tablet Oral BID   spironolactone  12.5 mg Oral Daily   thiamine  100 mg Oral Daily   Or   thiamine  100 mg Intravenous Daily   Continuous Infusions:   LOS: 1 day     Charise Killian, MD [facility] Pager [phone]  If 7PM-7AM, please contact night-coverage [email or web address] 07/21/2023, 8:42 AM

## 2023-07-22 DIAGNOSIS — I5043 Acute on chronic combined systolic (congestive) and diastolic (congestive) heart failure: Secondary | ICD-10-CM | POA: Diagnosis not present

## 2023-07-22 LAB — ECHOCARDIOGRAM COMPLETE
AR max vel: 2.79 cm2
AV Area VTI: 3.18 cm2
AV Area mean vel: 3.06 cm2
AV Mean grad: 2 mmHg
AV Peak grad: 4.2 mmHg
Ao pk vel: 1.03 m/s
Area-P 1/2: 4.63 cm2
Calc EF: 26.5 %
Height: 67 in
MV VTI: 2.79 cm2
S' Lateral: 5 cm
Single Plane A2C EF: 34.6 %
Single Plane A4C EF: 26.5 %
Weight: 3840 [oz_av]

## 2023-07-22 LAB — HEMOGLOBIN A1C
Hgb A1c MFr Bld: 10.6 % — ABNORMAL HIGH (ref 4.8–5.6)
Mean Plasma Glucose: 258 mg/dL

## 2023-07-22 LAB — GLUCOSE, CAPILLARY
Glucose-Capillary: 184 mg/dL — ABNORMAL HIGH (ref 70–99)
Glucose-Capillary: 247 mg/dL — ABNORMAL HIGH (ref 70–99)
Glucose-Capillary: 261 mg/dL — ABNORMAL HIGH (ref 70–99)

## 2023-07-22 LAB — BASIC METABOLIC PANEL WITH GFR
Anion gap: 10 (ref 5–15)
BUN: 19 mg/dL (ref 6–20)
CO2: 25 mmol/L (ref 22–32)
Calcium: 9.1 mg/dL (ref 8.9–10.3)
Chloride: 100 mmol/L (ref 98–111)
Creatinine, Ser: 1.18 mg/dL (ref 0.61–1.24)
GFR, Estimated: >60 mL/min (ref >60)
Glucose, Bld: 210 mg/dL — ABNORMAL HIGH (ref 70–99)
Potassium: 3.4 mmol/L — ABNORMAL LOW (ref 3.5–5.1)
Sodium: 135 mmol/L (ref 135–145)

## 2023-07-22 LAB — MAGNESIUM: Magnesium: 1.8 mg/dL (ref 1.7–2.4)

## 2023-07-22 MED ORDER — POTASSIUM CHLORIDE CRYS ER 20 MEQ PO TBCR
40.0000 meq | EXTENDED_RELEASE_TABLET | Freq: Once | ORAL | Status: AC
  Administered 2023-07-22: 40 meq via ORAL
  Filled 2023-07-22: qty 2

## 2023-07-22 NOTE — Discharge Summary (Signed)
 Physician Discharge Summary  Shane Frazier. ZOX:[id number] DOB: 17-Feb-1966 DOA: 07/20/2023  PCP: Smitty Cords, DO  Admit date: 07/20/2023 Discharge date: 07/22/2023  Admitted From: home  Disposition: home  Recommendations for Outpatient Follow-up:  Follow up with PCP in 1-2 weeks F/u w/ cardio in 1-2 weeks  Home Health: no  Equipment/Devices:  Discharge Condition: stable  CODE STATUS: full  Diet recommendation: Heart Healthy / Carb Modified   Brief/Interim Summary: Shane Frazier. is a 58 y.o. male with medical history significant of CAD, STEMI, s/p of DES, HTN, HLD, sCHF with EF 25-30%, hypothyroidism, depression, OSA on CPAP, obesity with BMI 37.59, brain trauma related seizure 2017 (not on seizure medication), migraine headache, alcohol use, medication noncompliance, who presents with SOB.   Patient states that he has not taken any medications for more than 6 months.  He developed shortness of breath in the past 2 weeks, which has been progressively worsening.  Patient has cough with white mucus production.  No chest pain, cough, SOB.  He has gained about 13 pounds of weight recently.  No nausea, vomiting, diarrhea or abdominal pain.  No symptoms of UTI.  Patient has bilateral leg edema.   Data reviewed independently and ED Course: pt was found to have BNP 749, WBC 5.8, GFR> 60, troponin 93, 89, procalcitonin <0.10.  Temperature normal, blood pressure 159/123, heart rate 105, 95, RR 22, 21, oxygen saturation 92-95% on room air.  Chest x-ray showed interstitial pulmonary edema.  Patient is admitted to telemetry bed as inpatient.  Discharge Diagnoses:  Principal Problem:   Acute on chronic combined systolic and diastolic heart failure (HCC) Active Problems:   CAD (coronary artery disease)   Essential hypertension   Type 2 diabetes mellitus with diabetic retinopathy (HCC)   Hypothyroidism   HLD (hyperlipidemia)   Alcohol use   OSA on CPAP   Obesity (BMI 30-39.9) Acute on  chronic combined CHF: has SOB, leg edema, elevated BNP 749, interstitial pulm edema chest x-ray, clinically consistent with CHF exacerbation. Secondary medication noncompliance as pt said he was going through a divorce. Continue on IV lasix, aldactone, entresto, coreg. Monitor I/Os and daily weights. Echo shows EF 25-30%, LV demonstrates global hypokinesis, diastolic function was indeterminate and no significant change from prior echo (as per cardio curbside).    Myocardial injury: w/ elevated troponins, likely secondary to demand ischemia. Trending down. Hx of CAD &  STEMI, s/p of DES. Continue on statin, coreg & brilinta    Hypokalemia: potassium given    HTN: continue on coreg, entresto. IV hydralazine prn    DM2: HbA1c 10.3, poorly controlled. Continue on SSI w/ accuchecks    Hypothyroidism: continue on home dose of levothyroxine    HLD: continue on statin     Alcohol use: continue on CIWA protocol. Received alcohol cessation counseling already    OSA: continue on CPAP    Obesity: BMI 37.5. Would benefit from weight loss   Discharge Instructions  Discharge Instructions     Diet - low sodium heart healthy   Complete by: As directed    Diet Carb Modified   Complete by: As directed    Discharge instructions   Complete by: As directed    F/u w/ PCP in 1-2 weeks. F/u w/ cardio in 1-2 weeks.   Increase activity slowly   Complete by: As directed       Allergies as of 07/22/2023       Reactions   Other Other (See  Comments)   BRAZILIAN NUTS ONLY-"THROAT CLOSES UP"        Medication List     TAKE these medications    aspirin 81 MG chewable tablet Chew 1 tablet (81 mg total) by mouth daily.   atorvastatin 80 MG tablet Commonly known as: LIPITOR Take 1 tablet (80 mg total) by mouth at bedtime.   carvedilol 3.125 MG tablet Commonly known as: COREG Take 3.125 mg by mouth 2 (two) times daily with a meal.   Entresto 97-103 MG Generic drug: sacubitril-valsartan Take 1  tablet by mouth 2 (two) times daily.   Farxiga 10 MG Tabs tablet Generic drug: dapagliflozin propanediol Take 1 tablet (10 mg total) by mouth daily.   folic acid 1 MG tablet Commonly known as: FOLVITE Take 1 tablet (1 mg total) by mouth daily.   furosemide 40 MG tablet Commonly known as: Lasix Take 1 tablet (40 mg total) by mouth daily as needed for edema or fluid.   levothyroxine 175 MCG tablet Commonly known as: SYNTHROID Take 1 tablet (175 mcg total) by mouth daily before breakfast.   metFORMIN 500 MG tablet Commonly known as: GLUCOPHAGE TAKE 1 TABLET (500 MG TOTAL) BY MOUTH 2 (TWO) TIMES DAILY WITH A MEAL.   multivitamin with minerals Tabs tablet Take 1 tablet by mouth daily.   spironolactone 25 MG tablet Commonly known as: ALDACTONE Take 0.5 tablets (12.5 mg total) by mouth daily.   thiamine 100 MG tablet Commonly known as: VITAMIN B1 Take 1 tablet (100 mg total) by mouth daily.   ticagrelor 90 MG Tabs tablet Commonly known as: BRILINTA Take 90 mg by mouth 2 (two) times daily.        Allergies  Allergen Reactions   Other Other (See Comments)    BRAZILIAN NUTS ONLY-"THROAT CLOSES UP"    Consultations:    Procedures/Studies: ECHOCARDIOGRAM COMPLETE Result Date: 07/22/2023    ECHOCARDIOGRAM REPORT   Patient Name:   Shane Frazier. Date of Exam: 07/21/2023 Medical Rec #:  [id number]      Height:       67.0 in Accession #:    [id number]     Weight:       240.0 lb Date of Birth:  1965-04-20      BSA:          2.185 m Patient Age:    57 years       BP:           138/96 mmHg Patient Gender: M              HR:           83 bpm. Exam Location:  [facility] Procedure: 2D Echo, 3D Echo, Cardiac Doppler, Color Doppler, Strain Analysis and            Intracardiac Opacification Agent (Both Spectral and Color Flow            Doppler were utilized during procedure). Indications:     CHF  History:         Patient has prior history of Echocardiogram examinations, most                   recent 08/10/2022. CHF, CAD and Previous Myocardial Infarction,                  Abnormal ECG, Signs/Symptoms:Shortness of Breath; Risk                  Factors:Hypertension, Diabetes, Dyslipidemia  and Sleep Apnea.                  ETOH use.  Sonographer:     Mikki Harbor Referring Phys:  [id number] Brien Few NIU Diagnosing Phys: Debbe Odea MD  Sonographer Comments: Patient is obese. Global longitudinal strain was attempted. IMPRESSIONS  1. Left ventricular ejection fraction, by estimation, is 25 to 30%. Left ventricular ejection fraction by 2D MOD biplane is 26.5 %. The left ventricle has severely decreased function. The left ventricle demonstrates global hypokinesis. There is moderate  left ventricular hypertrophy. Left ventricular diastolic parameters are indeterminate.  2. Right ventricular systolic function is mildly reduced. The right ventricular size is mildly enlarged.  3. The mitral valve is normal in structure. No evidence of mitral valve regurgitation.  4. The aortic valve is tricuspid. Aortic valve regurgitation is not visualized.  5. The inferior vena cava is normal in size with greater than 50% respiratory variability, suggesting right atrial pressure of 3 mmHg. FINDINGS  Left Ventricle: Left ventricular ejection fraction, by estimation, is 25 to 30%. Left ventricular ejection fraction by 2D MOD biplane is 26.5 %. The left ventricle has severely decreased function. The left ventricle demonstrates global hypokinesis. Definity contrast agent was given IV to delineate the left ventricular endocardial borders. Global longitudinal strain performed but not reported based on interpreter judgement due to suboptimal tracking. The left ventricular internal cavity size was normal in size. There is moderate left ventricular hypertrophy. Left ventricular diastolic parameters are indeterminate. Right Ventricle: The right ventricular size is mildly enlarged. No increase in right ventricular wall thickness. Right  ventricular systolic function is mildly reduced. Left Atrium: Left atrial size was normal in size. Right Atrium: Right atrial size was normal in size. Pericardium: There is no evidence of pericardial effusion. Mitral Valve: The mitral valve is normal in structure. No evidence of mitral valve regurgitation. MV peak gradient, 1.7 mmHg. The mean mitral valve gradient is 1.0 mmHg. Tricuspid Valve: The tricuspid valve is normal in structure. Tricuspid valve regurgitation is trivial. Aortic Valve: The aortic valve is tricuspid. Aortic valve regurgitation is not visualized. Aortic valve mean gradient measures 2.0 mmHg. Aortic valve peak gradient measures 4.2 mmHg. Aortic valve area, by VTI measures 3.18 cm. Pulmonic Valve: The pulmonic valve was normal in structure. Pulmonic valve regurgitation is not visualized. Aorta: The aortic root is normal in size and structure. Venous: The inferior vena cava is normal in size with greater than 50% respiratory variability, suggesting right atrial pressure of 3 mmHg. IAS/Shunts: No atrial level shunt detected by color flow Doppler.  LEFT VENTRICLE PLAX 2D                        Biplane EF (MOD) LVIDd:         5.40 cm         LV Biplane EF:   Left LVIDs:         5.00 cm                          ventricular LV PW:         1.50 cm                          ejection LV IVS:        1.60 cm  fraction by LVOT diam:     2.30 cm                          2D MOD LV SV:         54                               biplane is LV SV Index:   25                               26.5 %. LVOT Area:     4.15 cm                                Diastology                                LV e' medial:    4.33 cm/s LV Volumes (MOD)               LV E/e' medial:  13.8 LV vol d, MOD    159.0 ml      LV e' lateral:   5.98 cm/s A2C:                           LV E/e' lateral: 10.0 LV vol d, MOD    114.0 ml A4C: LV vol s, MOD    104.0 ml A2C: LV vol s, MOD    83.8 ml A4C: LV SV MOD A2C:   55.0 ml  LV SV MOD A4C:   114.0 ml LV SV MOD BP:    37.2 ml RIGHT VENTRICLE RV Basal diam:  3.75 cm RV Mid diam:    3.80 cm RV S prime:     7.63 cm/s TAPSE (M-mode): 2.2 cm LEFT ATRIUM             Index        RIGHT ATRIUM           Index LA diam:        5.00 cm 2.29 cm/m   RA Area:     16.00 cm LA Vol (A2C):   54.7 ml 25.03 ml/m  RA Volume:   41.90 ml  19.17 ml/m LA Vol (A4C):   69.1 ml 31.62 ml/m LA Biplane Vol: 66.4 ml 30.39 ml/m  AORTIC VALVE                    PULMONIC VALVE AV Area (Vmax):    2.79 cm     PV Vmax:       0.91 m/s AV Area (Vmean):   3.06 cm     PV Peak grad:  3.3 mmHg AV Area (VTI):     3.18 cm AV Vmax:           103.00 cm/s AV Vmean:          63.300 cm/s AV VTI:            0.171 m AV Peak Grad:      4.2 mmHg AV Mean Grad:      2.0 mmHg LVOT Vmax:         69.20 cm/s LVOT Vmean:  46.600 cm/s LVOT VTI:          0.131 m LVOT/AV VTI ratio: 0.77  AORTA Ao Root diam: 3.40 cm MITRAL VALVE MV Area (PHT): 4.63 cm    SHUNTS MV Area VTI:   2.79 cm    Systemic VTI:  0.13 m MV Peak grad:  1.7 mmHg    Systemic Diam: 2.30 cm MV Mean grad:  1.0 mmHg MV Vmax:       0.65 m/s MV Vmean:      39.9 cm/s MV Decel Time: 164 msec MV E velocity: 59.80 cm/s MV A velocity: 55.10 cm/s MV E/A ratio:  1.09 Debbe Odea MD Electronically signed by Debbe Odea MD Signature Date/Time: 07/22/2023/2:42:13 PM    Final    DG Chest 2 View Result Date: 07/20/2023 CLINICAL DATA:  Shortness of breath EXAM: CHEST - 2 VIEW COMPARISON:  Chest radiograph dated 08/09/2022 FINDINGS: Normal lung volumes. Confluent lower lobe opacity. Increased bilateral interstitial and perihilar opacities. Thickening of the fissures. No pneumothorax. Similar enlarged cardiomediastinal silhouette. No acute osseous abnormality. IMPRESSION: 1. Confluent lower lobe opacity, which may represent atelectasis, aspiration, or pneumonia. 2. Increased bilateral interstitial and perihilar opacities, likely pulmonary edema. 3. Trace fluid along the  fissures. Electronically Signed   By: Agustin Cree M.D.   On: 07/20/2023 14:55   (Echo, Carotid, EGD, Colonoscopy, ERCP)    Subjective: Pt c/o fatigue    Discharge Exam: Vitals:   07/22/23 0806 07/22/23 1200  BP: 104/81 112/82  Pulse: 91 86  Resp:    Temp: 97.7 F (36.5 C)   SpO2: 95% 93%   Vitals:   07/22/23 0435 07/22/23 0551 07/22/23 0806 07/22/23 1200  BP: 128/86  104/81 112/82  Pulse: 92  91 86  Resp: 18     Temp: (!) 97.1 F (36.2 C)  97.7 F (36.5 C)   TempSrc:      SpO2: 92%  95% 93%  Weight:  103.7 kg    Height:        General: Pt is alert, awake, not in acute distress Cardiovascular: S1/S2 +, no rubs, no gallops Respiratory: decreased breath sounds b/l  Abdominal: Soft, NT,obese, bowel sounds + Extremities: minimal b/l LE edema, no cyanosis    The results of significant diagnostics from this hospitalization (including imaging, microbiology, ancillary and laboratory) are listed below for reference.     Microbiology: Recent Results (from the past 240 hours)  Resp panel by RT-PCR (RSV, Flu A&B, Covid) Anterior Nasal Swab     Status: None   Collection Time: 07/20/23 12:42 PM   Specimen: Anterior Nasal Swab  Result Value Ref Range Status   SARS Coronavirus 2 by RT PCR NEGATIVE NEGATIVE Final    Comment: (NOTE) SARS-CoV-2 target nucleic acids are NOT DETECTED.  The SARS-CoV-2 RNA is generally detectable in upper respiratory specimens during the acute phase of infection. The lowest concentration of SARS-CoV-2 viral copies this assay can detect is 138 copies/mL. A negative result does not preclude SARS-Cov-2 infection and should not be used as the sole basis for treatment or other patient management decisions. A negative result may occur with  improper specimen collection/handling, submission of specimen other than nasopharyngeal swab, presence of viral mutation(s) within the areas targeted by this assay, and inadequate number of viral copies(<138  copies/mL). A negative result must be combined with clinical observations, patient history, and epidemiological information. The expected result is Negative.  Fact Sheet for Patients:  BloggerCourse.com  Fact Sheet for Healthcare Providers:  SeriousBroker.it  This test is no t yet approved or cleared by the [place] and  has been authorized for detection and/or diagnosis of SARS-CoV-2 by FDA under an Emergency Use Authorization (EUA). This EUA will remain  in effect (meaning this test can be used) for the duration of the COVID-19 declaration under Section 564(b)(1) of the Act, 21 U.S.C.section 360bbb-3(b)(1), unless the authorization is terminated  or revoked sooner.       Influenza A by PCR NEGATIVE NEGATIVE Final   Influenza B by PCR NEGATIVE NEGATIVE Final    Comment: (NOTE) The Xpert Xpress SARS-CoV-2/FLU/RSV plus assay is intended as an aid in the diagnosis of influenza from Nasopharyngeal swab specimens and should not be used as a sole basis for treatment. Nasal washings and aspirates are unacceptable for Xpert Xpress SARS-CoV-2/FLU/RSV testing.  Fact Sheet for Patients: BloggerCourse.com  Fact Sheet for Healthcare Providers: SeriousBroker.it  This test is not yet approved or cleared by the [place] FDA and has been authorized for detection and/or diagnosis of SARS-CoV-2 by FDA under an Emergency Use Authorization (EUA). This EUA will remain in effect (meaning this test can be used) for the duration of the COVID-19 declaration under Section 564(b)(1) of the Act, 21 U.S.C. section 360bbb-3(b)(1), unless the authorization is terminated or revoked.     Resp Syncytial Virus by PCR NEGATIVE NEGATIVE Final    Comment: (NOTE) Fact Sheet for Patients: BloggerCourse.com  Fact Sheet for Healthcare  Providers: SeriousBroker.it  This test is not yet approved or cleared by the [place] FDA and has been authorized for detection and/or diagnosis of SARS-CoV-2 by FDA under an Emergency Use Authorization (EUA). This EUA will remain in effect (meaning this test can be used) for the duration of the COVID-19 declaration under Section 564(b)(1) of the Act, 21 U.S.C. section 360bbb-3(b)(1), unless the authorization is terminated or revoked.  Performed at [facility], [address] [place], [place] [address]      Labs: BNP (last 3 results) Recent Labs    08/15/22 1203 09/20/22 1217 07/20/23 1415  BNP 97.9 204.9* 749.1*   Basic Metabolic Panel: Recent Labs  Lab 07/20/23 1415 07/20/23 1733 07/21/23 0336 07/22/23 0553  NA 141 140 139 135  K 3.7 3.9 3.0* 3.4*  CL 105 103 100 100  CO2 28 28 27 25   GLUCOSE 249* 271* 135* 210*  BUN 18 17 14 19   CREATININE 1.10 1.02 0.90 1.18  CALCIUM 8.8* 8.8* 9.1 9.1  MG  --  1.7  --  1.8   Liver Function Tests: Recent Labs  Lab 07/20/23 1733  AST 25  ALT 27  ALKPHOS 70  BILITOT 0.8  PROT 6.8  ALBUMIN 3.3*   No results for input(s): "LIPASE", "AMYLASE" in the last 168 hours. No results for input(s): "AMMONIA" in the last 168 hours. CBC: Recent Labs  Lab 07/20/23 1415  WBC 5.8  NEUTROABS 3.8  HGB 14.2  HCT 42.1  MCV 89.4  PLT 248   Cardiac Enzymes: No results for input(s): "CKTOTAL", "CKMB", "CKMBINDEX", "TROPONINI" in the last 168 hours. BNP: Invalid input(s): "POCBNP" CBG: Recent Labs  Lab 07/21/23 1149 07/21/23 1724 07/21/23 2126 07/22/23 0807 07/22/23 1200  GLUCAP 279* 260* 357* 184* 247*   D-Dimer No results for input(s): "DDIMER" in the last 72 hours. Hgb A1c Recent Labs    07/20/23 1415  HGBA1C 10.6*   Lipid Profile Recent Labs    07/21/23 0336  CHOL 241*  HDL 77  LDLCALC 144*  TRIG 100  CHOLHDL 3.1   Thyroid function studies Recent Labs     07/20/23 1415  TSH 25.444*   Anemia work up No results for input(s): "VITAMINB12", "FOLATE", "FERRITIN", "TIBC", "IRON", "RETICCTPCT" in the last 72 hours. Urinalysis    Component Value Date/Time   COLORURINE STRAW (A) 10/01/2017 1214   APPEARANCEUR CLEAR (A) 10/01/2017 1214   LABSPEC 1.013 10/01/2017 1214   PHURINE 7.0 10/01/2017 1214   GLUCOSEU 150 (A) 10/01/2017 1214   HGBUR NEGATIVE 10/01/2017 1214   BILIRUBINUR negative 09/11/2019 1013   KETONESUR NEGATIVE 10/01/2017 1214   PROTEINUR Negative 09/11/2019 1013   PROTEINUR 30 (A) 10/01/2017 1214   UROBILINOGEN 0.2 09/11/2019 1013   NITRITE negative 09/11/2019 1013   NITRITE NEGATIVE 10/01/2017 1214   LEUKOCYTESUR Negative 09/11/2019 1013   Sepsis Labs Recent Labs  Lab 07/20/23 1415  WBC 5.8   Microbiology Recent Results (from the past 240 hours)  Resp panel by RT-PCR (RSV, Flu A&B, Covid) Anterior Nasal Swab     Status: None   Collection Time: 07/20/23 12:42 PM   Specimen: Anterior Nasal Swab  Result Value Ref Range Status   SARS Coronavirus 2 by RT PCR NEGATIVE NEGATIVE Final    Comment: (NOTE) SARS-CoV-2 target nucleic acids are NOT DETECTED.  The SARS-CoV-2 RNA is generally detectable in upper respiratory specimens during the acute phase of infection. The lowest concentration of SARS-CoV-2 viral copies this assay can detect is 138 copies/mL. A negative result does not preclude SARS-Cov-2 infection and should not be used as the sole basis for treatment or other patient management decisions. A negative result may occur with  improper specimen collection/handling, submission of specimen other than nasopharyngeal swab, presence of viral mutation(s) within the areas targeted by this assay, and inadequate number of viral copies(<138 copies/mL). A negative result must be combined with clinical observations, patient history, and epidemiological information. The expected result is Negative.  Fact Sheet for Patients:   BloggerCourse.com  Fact Sheet for Healthcare Providers:  SeriousBroker.it  This test is no t yet approved or cleared by the [place] FDA and  has been authorized for detection and/or diagnosis of SARS-CoV-2 by FDA under an Emergency Use Authorization (EUA). This EUA will remain  in effect (meaning this test can be used) for the duration of the COVID-19 declaration under Section 564(b)(1) of the Act, 21 U.S.C.section 360bbb-3(b)(1), unless the authorization is terminated  or revoked sooner.       Influenza A by PCR NEGATIVE NEGATIVE Final   Influenza B by PCR NEGATIVE NEGATIVE Final    Comment: (NOTE) The Xpert Xpress SARS-CoV-2/FLU/RSV plus assay is intended as an aid in the diagnosis of influenza from Nasopharyngeal swab specimens and should not be used as a sole basis for treatment. Nasal washings and aspirates are unacceptable for Xpert Xpress SARS-CoV-2/FLU/RSV testing.  Fact Sheet for Patients: BloggerCourse.com  Fact Sheet for Healthcare Providers: SeriousBroker.it  This test is not yet approved or cleared by the [place] FDA and has been authorized for detection and/or diagnosis of SARS-CoV-2 by FDA under an Emergency Use Authorization (EUA). This EUA will remain in effect (meaning this test can be used) for the duration of the COVID-19 declaration under Section 564(b)(1) of the Act, 21 U.S.C. section 360bbb-3(b)(1), unless the authorization is terminated or revoked.     Resp Syncytial Virus by PCR NEGATIVE NEGATIVE Final    Comment: (NOTE) Fact Sheet for Patients: BloggerCourse.com  Fact Sheet for Healthcare Providers: SeriousBroker.it  This test is not  yet approved or cleared by the [place] and has been authorized for detection and/or diagnosis of SARS-CoV-2 by FDA under an Emergency Use  Authorization (EUA). This EUA will remain in effect (meaning this test can be used) for the duration of the COVID-19 declaration under Section 564(b)(1) of the Act, 21 U.S.C. section 360bbb-3(b)(1), unless the authorization is terminated or revoked.  Performed at [facility], [address] [place], [place] [address]      Time coordinating discharge: Over 30 minutes  SIGNED:   Charise Killian, MD  [facility] 07/22/2023, 2:47 PM Pager   If 7PM-7AM, please contact night-coverage [email or web address]

## 2023-07-24 ENCOUNTER — Telehealth: Payer: Self-pay | Admitting: Pharmacist

## 2023-07-24 NOTE — Telephone Encounter (Signed)
   Outreach Note  07/24/2023 Name: Montgomery Favor. MRN: [id number] DOB: 10-Jun-1965  Referred by: Smitty Cords, DO  Patient appearing on report for [facility] - Diabetes Control report due to last documented A1C of 10.6% on 07/20/2023. No next appointment with PCP is currently scheduled    Outreached patient to discuss diabetes control and medication management.   Was unable to reach patient via telephone today and have left HIPAA compliant voicemail asking patient to return my call.   Estelle Grumbles, PharmD, [facility] Clinical Pharmacist [facility] [phone]

## 2023-07-28 ENCOUNTER — Telehealth: Payer: Self-pay | Admitting: Pharmacist

## 2023-07-28 NOTE — Telephone Encounter (Signed)
   Outreach Note  07/28/2023 Name: Shane Frazier. MRN: [id number] DOB: May 06, 1965  Referred by: Smitty Cords, DO  Patient appearing on report for [employer] [facility] - Diabetes Control report due to last documented A1C of 10.6% on 07/20/2023. No next appointment with PCP is currently scheduled    Outreached patient to discuss diabetes control and medication management.   Was unable to reach patient via telephone today and have left HIPAA compliant voicemail asking patient to return my call. Outreach attempt #2  Estelle Grumbles, PharmD, [facility] Clinical Pharmacist [facility] [phone]

## 2023-11-29 ENCOUNTER — Inpatient Hospital Stay
Admission: EM | Admit: 2023-11-29 | Discharge: 2023-12-02 | DRG: 280 | Disposition: A | Discharge status: Home / Self Care | Attending: Internal Medicine | Admitting: Internal Medicine

## 2023-11-29 ENCOUNTER — Emergency Department

## 2023-11-29 ENCOUNTER — Other Ambulatory Visit: Payer: Self-pay

## 2023-11-29 DIAGNOSIS — Z91018 Allergy to other foods: Secondary | ICD-10-CM

## 2023-11-29 DIAGNOSIS — Y831 Surgical operation with implant of artificial internal device as the cause of abnormal reaction of the patient, or of later complication, without mention of misadventure at the time of the procedure: Secondary | ICD-10-CM | POA: Diagnosis present

## 2023-11-29 DIAGNOSIS — J9601 Acute respiratory failure with hypoxia: Secondary | ICD-10-CM | POA: Diagnosis present

## 2023-11-29 DIAGNOSIS — I214 Non-ST elevation (NSTEMI) myocardial infarction: Principal | ICD-10-CM | POA: Diagnosis present

## 2023-11-29 DIAGNOSIS — Z7982 Long term (current) use of aspirin: Secondary | ICD-10-CM

## 2023-11-29 DIAGNOSIS — I5023 Acute on chronic systolic (congestive) heart failure: Secondary | ICD-10-CM

## 2023-11-29 DIAGNOSIS — E785 Hyperlipidemia, unspecified: Secondary | ICD-10-CM | POA: Diagnosis present

## 2023-11-29 DIAGNOSIS — Z6832 Body mass index (BMI) 32.0-32.9, adult: Secondary | ICD-10-CM

## 2023-11-29 DIAGNOSIS — E039 Hypothyroidism, unspecified: Secondary | ICD-10-CM | POA: Diagnosis present

## 2023-11-29 DIAGNOSIS — Z87820 Personal history of traumatic brain injury: Secondary | ICD-10-CM

## 2023-11-29 DIAGNOSIS — Z720 Tobacco use: Secondary | ICD-10-CM

## 2023-11-29 DIAGNOSIS — Z7984 Long term (current) use of oral hypoglycemic drugs: Secondary | ICD-10-CM

## 2023-11-29 DIAGNOSIS — I10 Essential (primary) hypertension: Secondary | ICD-10-CM | POA: Diagnosis present

## 2023-11-29 DIAGNOSIS — R0602 Shortness of breath: Secondary | ICD-10-CM | POA: Diagnosis not present

## 2023-11-29 DIAGNOSIS — T82855A Stenosis of coronary artery stent, initial encounter: Secondary | ICD-10-CM | POA: Diagnosis present

## 2023-11-29 DIAGNOSIS — Z91148 Patient's other noncompliance with medication regimen for other reason: Secondary | ICD-10-CM

## 2023-11-29 DIAGNOSIS — Z808 Family history of malignant neoplasm of other organs or systems: Secondary | ICD-10-CM

## 2023-11-29 DIAGNOSIS — Z79899 Other long term (current) drug therapy: Secondary | ICD-10-CM

## 2023-11-29 DIAGNOSIS — F1010 Alcohol abuse, uncomplicated: Secondary | ICD-10-CM | POA: Diagnosis present

## 2023-11-29 DIAGNOSIS — Z5982 Transportation insecurity: Secondary | ICD-10-CM

## 2023-11-29 DIAGNOSIS — Z833 Family history of diabetes mellitus: Secondary | ICD-10-CM

## 2023-11-29 DIAGNOSIS — I2510 Atherosclerotic heart disease of native coronary artery without angina pectoris: Secondary | ICD-10-CM | POA: Diagnosis present

## 2023-11-29 DIAGNOSIS — I509 Heart failure, unspecified: Secondary | ICD-10-CM

## 2023-11-29 DIAGNOSIS — E669 Obesity, unspecified: Secondary | ICD-10-CM | POA: Diagnosis present

## 2023-11-29 DIAGNOSIS — I5043 Acute on chronic combined systolic (congestive) and diastolic (congestive) heart failure: Secondary | ICD-10-CM | POA: Diagnosis present

## 2023-11-29 DIAGNOSIS — I255 Ischemic cardiomyopathy: Secondary | ICD-10-CM | POA: Diagnosis present

## 2023-11-29 DIAGNOSIS — Z79890 Hormone replacement therapy (postmenopausal): Secondary | ICD-10-CM

## 2023-11-29 DIAGNOSIS — E1165 Type 2 diabetes mellitus with hyperglycemia: Secondary | ICD-10-CM | POA: Diagnosis present

## 2023-11-29 DIAGNOSIS — I252 Old myocardial infarction: Secondary | ICD-10-CM

## 2023-11-29 DIAGNOSIS — R7989 Other specified abnormal findings of blood chemistry: Secondary | ICD-10-CM

## 2023-11-29 DIAGNOSIS — G4733 Obstructive sleep apnea (adult) (pediatric): Secondary | ICD-10-CM | POA: Diagnosis present

## 2023-11-29 DIAGNOSIS — F1090 Alcohol use, unspecified, uncomplicated: Secondary | ICD-10-CM | POA: Diagnosis present

## 2023-11-29 DIAGNOSIS — I110 Hypertensive heart disease with heart failure: Secondary | ICD-10-CM | POA: Diagnosis present

## 2023-11-29 DIAGNOSIS — E876 Hypokalemia: Secondary | ICD-10-CM | POA: Diagnosis present

## 2023-11-29 DIAGNOSIS — Z7902 Long term (current) use of antithrombotics/antiplatelets: Secondary | ICD-10-CM

## 2023-11-29 DIAGNOSIS — I428 Other cardiomyopathies: Secondary | ICD-10-CM | POA: Diagnosis present

## 2023-11-29 DIAGNOSIS — Z8249 Family history of ischemic heart disease and other diseases of the circulatory system: Secondary | ICD-10-CM

## 2023-11-29 HISTORY — DX: Type 2 diabetes mellitus without complications: E11.9

## 2023-11-29 LAB — COMPREHENSIVE METABOLIC PANEL WITH GFR
ALT: 25 U/L (ref 0–44)
AST: 30 U/L (ref 15–41)
Albumin: 3.7 g/dL (ref 3.5–5.0)
Alkaline Phosphatase: 65 U/L (ref 38–126)
Anion gap: 11 (ref 5–15)
BUN: 14 mg/dL (ref 6–20)
CO2: 26 mmol/L (ref 22–32)
Calcium: 8.8 mg/dL — ABNORMAL LOW (ref 8.9–10.3)
Chloride: 102 mmol/L (ref 98–111)
Creatinine, Ser: 1.2 mg/dL (ref 0.61–1.24)
GFR, Estimated: >60 mL/min (ref >60)
Glucose, Bld: 232 mg/dL — ABNORMAL HIGH (ref 70–99)
Potassium: 3.7 mmol/L (ref 3.5–5.1)
Sodium: 139 mmol/L (ref 135–145)
Total Bilirubin: 1.2 mg/dL (ref 0.0–1.2)
Total Protein: 7.8 g/dL (ref 6.5–8.1)

## 2023-11-29 LAB — APTT: aPTT: 27 s (ref 24–36)

## 2023-11-29 LAB — TROPONIN I (HIGH SENSITIVITY)
Troponin I (High Sensitivity): 1194 ng/L (ref <18)
Troponin I (High Sensitivity): 1135 ng/L (ref <18)

## 2023-11-29 LAB — CBC
HCT: 44 % (ref 39.0–52.0)
Hemoglobin: 14.7 g/dL (ref 13.0–17.0)
MCH: 30.4 pg (ref 26.0–34.0)
MCHC: 33.4 g/dL (ref 30.0–36.0)
MCV: 91.1 fL (ref 80.0–100.0)
Platelets: 273 K/uL (ref 150–400)
RBC: 4.83 MIL/uL (ref 4.22–5.81)
RDW: 13.2 % (ref 11.5–15.5)
WBC: 6.6 K/uL (ref 4.0–10.5)
nRBC: 0 % (ref 0.0–0.2)

## 2023-11-29 LAB — PROTIME-INR
INR: 1.1 (ref 0.8–1.2)
Prothrombin Time: 14.5 s (ref 11.4–15.2)

## 2023-11-29 LAB — CBG MONITORING, ED: Glucose-Capillary: 215 mg/dL — ABNORMAL HIGH (ref 70–99)

## 2023-11-29 LAB — BRAIN NATRIURETIC PEPTIDE: B Natriuretic Peptide: 1433.4 pg/mL — ABNORMAL HIGH (ref 0.0–100.0)

## 2023-11-29 MED ORDER — HEPARIN (PORCINE) 25000 UT/250ML-% IV SOLN
1500.0000 [IU]/h | INTRAVENOUS | Status: DC
  Administered 2023-11-29 (×2): 1200 [IU]/h via INTRAVENOUS
  Filled 2023-11-29 (×2): qty 250

## 2023-11-29 MED ORDER — THIAMINE MONONITRATE 100 MG PO TABS
100.0000 mg | ORAL_TABLET | Freq: Every day | ORAL | Status: DC
  Administered 2023-11-29 – 2023-12-02 (×5): 100 mg via ORAL
  Filled 2023-11-29 (×4): qty 1

## 2023-11-29 MED ORDER — FOLIC ACID 1 MG PO TABS
1.0000 mg | ORAL_TABLET | Freq: Every day | ORAL | Status: DC
  Administered 2023-11-29 – 2023-12-02 (×5): 1 mg via ORAL
  Filled 2023-11-29 (×4): qty 1

## 2023-11-29 MED ORDER — ONDANSETRON HCL 4 MG/2ML IJ SOLN
4.0000 mg | Freq: Four times a day (QID) | INTRAMUSCULAR | Status: DC | PRN
  Administered 2023-11-30: 4 mg via INTRAVENOUS
  Filled 2023-11-29: qty 2

## 2023-11-29 MED ORDER — FUROSEMIDE 10 MG/ML IJ SOLN
40.0000 mg | Freq: Once | INTRAMUSCULAR | Status: AC
  Administered 2023-11-29 (×2): 40 mg via INTRAVENOUS
  Filled 2023-11-29: qty 4

## 2023-11-29 MED ORDER — LEVOTHYROXINE SODIUM 50 MCG PO TABS
175.0000 ug | ORAL_TABLET | Freq: Every day | ORAL | Status: DC
  Administered 2023-11-30 – 2023-12-02 (×3): 175 ug via ORAL
  Filled 2023-11-29: qty 1
  Filled 2023-11-29: qty 4
  Filled 2023-11-29: qty 1

## 2023-11-29 MED ORDER — HEPARIN BOLUS VIA INFUSION
4000.0000 [IU] | Freq: Once | INTRAVENOUS | Status: AC
  Administered 2023-11-29 (×2): 4000 [IU] via INTRAVENOUS
  Filled 2023-11-29: qty 4000

## 2023-11-29 MED ORDER — THIAMINE HCL 100 MG/ML IJ SOLN
100.0000 mg | Freq: Every day | INTRAMUSCULAR | Status: DC
  Filled 2023-11-29 (×2): qty 2

## 2023-11-29 MED ORDER — LORAZEPAM 2 MG/ML IJ SOLN: 1.0000 mg | INTRAMUSCULAR | Status: DC | PRN

## 2023-11-29 MED ORDER — SPIRONOLACTONE 12.5 MG HALF TABLET: 12.5000 mg | ORAL_TABLET | Freq: Every day | ORAL | Status: DC

## 2023-11-29 MED ORDER — ACETAMINOPHEN 325 MG PO TABS
650.0000 mg | ORAL_TABLET | ORAL | Status: DC | PRN
  Administered 2023-11-30: 650 mg via ORAL
  Filled 2023-11-29: qty 2

## 2023-11-29 MED ORDER — ADULT MULTIVITAMIN W/MINERALS CH
1.0000 | ORAL_TABLET | Freq: Every day | ORAL | Status: DC
  Administered 2023-11-29 – 2023-12-02 (×5): 1 via ORAL
  Filled 2023-11-29 (×4): qty 1

## 2023-11-29 MED ORDER — INSULIN ASPART 100 UNIT/ML IJ SOLN
0.0000 [IU] | INTRAMUSCULAR | Status: DC
  Administered 2023-11-29 – 2023-11-30 (×3): 5 [IU] via SUBCUTANEOUS
  Administered 2023-11-30 (×2): 2 [IU] via SUBCUTANEOUS
  Administered 2023-11-30: 5 [IU] via SUBCUTANEOUS
  Administered 2023-12-01: 8 [IU] via SUBCUTANEOUS
  Administered 2023-12-01: 5 [IU] via SUBCUTANEOUS
  Administered 2023-12-01: 3 [IU] via SUBCUTANEOUS
  Administered 2023-12-02: 5 [IU] via SUBCUTANEOUS
  Administered 2023-12-02 (×2): 3 [IU] via SUBCUTANEOUS
  Filled 2023-11-29 (×2): qty 1
  Filled 2023-11-29: qty 2
  Filled 2023-11-29 (×4): qty 1
  Filled 2023-11-29: qty 2
  Filled 2023-11-29 (×2): qty 1
  Filled 2023-11-29: qty 5

## 2023-11-29 MED ORDER — LORAZEPAM 1 MG PO TABS
1.0000 mg | ORAL_TABLET | ORAL | Status: DC | PRN
  Administered 2023-11-30: 1 mg via ORAL
  Filled 2023-11-29: qty 1

## 2023-11-29 MED ORDER — NITROGLYCERIN 2 % TD OINT
1.0000 [in_us] | TOPICAL_OINTMENT | Freq: Four times a day (QID) | TRANSDERMAL | Status: DC
  Administered 2023-11-30 (×3): 1 [in_us] via TOPICAL
  Filled 2023-11-29 (×5): qty 1

## 2023-11-29 MED ORDER — ATORVASTATIN CALCIUM 80 MG PO TABS
80.0000 mg | ORAL_TABLET | Freq: Every day | ORAL | Status: DC
  Administered 2023-11-29 – 2023-12-01 (×4): 80 mg via ORAL
  Filled 2023-11-29: qty 4
  Filled 2023-11-29 (×2): qty 1

## 2023-11-29 MED ORDER — ASPIRIN 81 MG PO CHEW: 81.0000 mg | CHEWABLE_TABLET | Freq: Every day | ORAL | Status: DC

## 2023-11-29 MED ORDER — CARVEDILOL 3.125 MG PO TABS
3.1250 mg | ORAL_TABLET | Freq: Two times a day (BID) | ORAL | Status: DC
  Administered 2023-11-30 – 2023-12-02 (×5): 3.125 mg via ORAL
  Filled 2023-11-29 (×5): qty 1

## 2023-11-29 MED ORDER — ASPIRIN 81 MG PO TBEC: 81.0000 mg | DELAYED_RELEASE_TABLET | Freq: Every day | ORAL | Status: DC

## 2023-11-29 NOTE — Assessment & Plan Note (Signed)
 CIWA withdrawal protocol

## 2023-11-29 NOTE — ED Triage Notes (Signed)
 Patient states shortness of breath for a week; initial sat after ambulating to triage was 88%. History of CHF, states he has missed doses of his medications.

## 2023-11-29 NOTE — ED Notes (Signed)
Placed patient on 2L [place].

## 2023-11-29 NOTE — ED Notes (Signed)
 Reviewed pt's triage and results; troponin added and acuity level changed

## 2023-11-29 NOTE — Assessment & Plan Note (Signed)
 Sliding scale insulin  coverage

## 2023-11-29 NOTE — Consult Note (Signed)
 PHARMACY - ANTICOAGULATION CONSULT NOTE  Pharmacy Consult for heparin   Indication: chest pain/ACS  Allergies  Allergen Reactions   Other Other (See Comments)    BRAZILIAN NUTS ONLY-THROAT CLOSES UP    Patient Measurements: Height: 5' 7 (170.2 cm) Weight: 99.3 kg (219 lb) IBW/kg (Calculated) : 66.1 HEPARIN  DW (KG): 87.6  Vital Signs: Temp: 98.5 F (36.9 C) (08/13 1809) Temp Source: Oral (08/13 1809) BP: 176/109 (08/13 1809) Pulse Rate: 114 (08/13 1816)  Labs: Recent Labs    11/29/23 1816  HGB 14.7  HCT 44.0  PLT 273  CREATININE 1.20  TROPONINIHS 1,194*    Estimated Creatinine Clearance: 77.2 mL/min (by C-G formula based on SCr of 1.2 mg/dL).   Medical History: Past Medical History:  Diagnosis Date   CHF (congestive heart failure), NYHA class I, acute on chronic, systolic (HCC)    Concussion 2017   cause of seizure after hit head at work   Difficult intubation    patient unaware of this diagnosis   ED (erectile dysfunction)    Hypothyroidism    Seizures (HCC) 05-20-15   X 1-PT HIT HIS HEAD AT WORK AND THEN HAD A SEIZURE    Medications:  (Not in a hospital admission)  Scheduled:   furosemide   40 mg Intravenous Once   Infusions:  PRN:  Anti-infectives (From admission, onward)    None       Assessment: 58 year old male presented to the ED with SOB for a week and has a hx of CHF, DM, HTN, HLD, and CAD. Hgb/Plt are stable. Trop elevated at 1194. EKG showed sinus tachy no provider assessment in note. No DOAC PTA. Pharmacy consulted to start heparin  for ACS.   Goal of Therapy:  Heparin  level 0.3-0.7 units/ml Monitor platelets by anticoagulation protocol: Yes   Plan:  Give 4000 units bolus x 1 Start heparin  infusion at 1200 units/hr Check anti-Xa level in 6 hours and daily while on heparin  Continue to monitor H&H and platelets  Cathaleen GORMAN Blanch, PharmD, BCPS 11/29/2023,8:28 PM

## 2023-11-29 NOTE — Assessment & Plan Note (Addendum)
 Continue carvedilol  and spironolactone  Cardiology is also planning to resume Entresto  after the cath

## 2023-11-29 NOTE — Assessment & Plan Note (Addendum)
 No acute issues Not on AEDs As needed pain meds for headache

## 2023-11-29 NOTE — Assessment & Plan Note (Signed)
 CPAP nightly

## 2023-11-29 NOTE — ED Provider Notes (Signed)
 [facility] Provider Note   Event Date/Time   First MD Initiated Contact with Patient 11/29/23 2020     (approximate) History  Shortness of Breath  HPI Shane L Hermenegildo Clausen. is a 58 y.o. male with a past medical history of hypothyroidism, epilepsy, CHF, hyperlipidemia, and obesity who presents complaining of worsening chest pain and shortness of breath over the last week.  Patient denies missing any doses of his Lasix  or having any medication dosages changed over this time.  Patient complains of worsening dyspnea on exertion and paroxysmal nocturnal dyspnea. ROS: Patient currently denies any vision changes, tinnitus, difficulty speaking, facial droop, sore throat, abdominal pain, nausea/vomiting/diarrhea, dysuria, or weakness/numbness/paresthesias in any extremity   Physical Exam  Triage Vital Signs: ED Triage Vitals  Encounter Vitals Group     BP 11/29/23 1809 (!) 176/109     Girls Systolic BP Percentile --      Girls Diastolic BP Percentile --      Boys Systolic BP Percentile --      Boys Diastolic BP Percentile --      Pulse Rate 11/29/23 1809 (!) 120     Resp 11/29/23 1809 (!) 26     Temp 11/29/23 1809 98.5 F (36.9 C)     Temp Source 11/29/23 1809 Oral     SpO2 11/29/23 1809 (!) 88 %     Weight 11/29/23 1813 219 lb (99.3 kg)     Height 11/29/23 1813 5' 7 (1.702 m)     Head Circumference --      Peak Flow --      Pain Score 11/29/23 1813 0     Pain Loc --      Pain Education --      Exclude from Growth Chart --    Most recent vital signs: Vitals:   11/29/23 1809 11/29/23 1816  BP: (!) 176/109   Pulse: (!) 120 (!) 114  Resp: (!) 26 (!) 22  Temp: 98.5 F (36.9 C)   SpO2: (!) 88% 94%   General: Awake, oriented x4. CV:  Good peripheral perfusion. Resp:  Increased effort.  Rales over bilateral lung fields.  2 L nasal cannula Abd:  No distention. Other:  Middle-aged overweight African male resting comfortably in no acute distress ED Results /  Procedures / Treatments  Labs (all labs ordered are listed, but only abnormal results are displayed) Labs Reviewed  COMPREHENSIVE METABOLIC PANEL WITH GFR - Abnormal; Notable for the following components:      Result Value   Glucose, Bld 232 (*)    Calcium  8.8 (*)    All other components within normal limits  BRAIN NATRIURETIC PEPTIDE - Abnormal; Notable for the following components:   B Natriuretic Peptide 1,433.4 (*)    All other components within normal limits  TROPONIN I (HIGH SENSITIVITY) - Abnormal; Notable for the following components:   Troponin I (High Sensitivity) 1,194 (*)    All other components within normal limits  CBC  APTT  PROTIME-INR  HEPARIN  LEVEL (UNFRACTIONATED)  TROPONIN I (HIGH SENSITIVITY)   EKG ED ECG REPORT I, Artist MARLA Kerns, the attending physician, personally viewed and interpreted this ECG. Date: 11/29/2023 EKG Time: 1817 Rate: 109 Rhythm: Tachycardic sinus rhythm QRS Axis: normal Intervals: normal ST/T Wave abnormalities: normal Narrative Interpretation: Tachycardic sinus rhythm.  No evidence of acute ischemia RADIOLOGY ED MD interpretation: 2 view chest x-ray shows cardiomegaly with edema - All radiology independently interpreted and agree with radiology assessment Official radiology  report(s): No results found. PROCEDURES: Critical Care performed: Yes, see critical care procedure note(s) .1-3 Lead EKG Interpretation  Performed by: Jossie Artist POUR, MD Authorized by: Jossie Artist POUR, MD     Interpretation: abnormal     ECG rate:  111   ECG rate assessment: tachycardic     Rhythm: sinus tachycardia     Ectopy: none     Conduction: normal   CRITICAL CARE Performed by: Demonica Farrey K Dovie Kapusta  Total critical care time: 33 minutes  Critical care time was exclusive of separately billable procedures and treating other patients.  Critical care was necessary to treat or prevent imminent or life-threatening deterioration.  Critical care was time  spent personally by me on the following activities: development of treatment plan with patient and/or surrogate as well as nursing, discussions with consultants, evaluation of patient's response to treatment, examination of patient, obtaining history from patient or surrogate, ordering and performing treatments and interventions, ordering and review of laboratory studies, ordering and review of radiographic studies, pulse oximetry and re-evaluation of patient's condition.  MEDICATIONS ORDERED IN ED: Medications  furosemide  (LASIX ) injection 40 mg (has no administration in time range)  heparin  bolus via infusion 4,000 Units (has no administration in time range)  heparin  ADULT infusion 100 units/mL (25000 units/250mL) (has no administration in time range)   IMPRESSION / MDM / ASSESSMENT AND PLAN / ED COURSE  I reviewed the triage vital signs and the nursing notes.                             The patient is on the cardiac monitor to evaluate for evidence of arrhythmia and/or significant heart rate changes. Patient's presentation is most consistent with acute presentation with potential threat to life or bodily function. 58 year old male with the above-stated past medical history presents for worsening shortness of breath and occasional chest pain Endorsing dyspnea, endorses LE edema Denies Non adherence to medication regimen  Workup: ECG, CBC, BMP, Troponin, BNP, CXR Findings: EKG: No STEMI and no evidence of Brugada's sign, delta wave, epsilon wave, significantly prolonged QTc, or malignant arrhythmia. BNP: 1433 CXR: Cardiomegaly with pulmonary edema Based on history, exam and findings, presentation most consistent with acute on chronic heart failure. Low suspicion for PNA, ACS, tamponade, aortic dissection. Interventions: Oxygen, Diuresis  Reassessment: Symptoms improved in ED with oxygen and diuresis  Disposition (Stable but not significantly improved): Admit to medicine for further  monitoring and for improvement of medication regimen to control symptoms.    FINAL CLINICAL IMPRESSION(S) / ED DIAGNOSES   Final diagnoses:  SOB (shortness of breath)  Acute on chronic congestive heart failure, unspecified heart failure type (HCC)  Elevated troponin   Rx / DC Orders   ED Discharge Orders     None      Note:  This document was prepared using Dragon voice recognition software and may include unintentional dictation errors.   Jarryn Altland K, MD 11/29/23 2035

## 2023-11-29 NOTE — H&P (Addendum)
 History and Physical    Patient: Shane Frazier. FMW:[id number] DOB: [birth year] DOA: 11/29/2023 DOS: the patient was seen and examined on 11/29/2023 PCP: Edman Marsa PARAS, DO  Patient coming from: Home  Chief Complaint:  Chief Complaint  Patient presents with   Shortness of Breath    HPI: Shane Frazier. is a 58 y.o. male with medical history significant for  CAD s/p PCI 2024 for anterior STEMI, , sCHF(EF 25-30% 07/2023), HTN, DM, OSA on CPAP, posttraumatic seizure/chronic headaches,, not on AED, alcohol use, poor outpatient follow up, with last hospitalization in April 2025 for CHF exacerbation, being admitted with CHF exacerbation and NSTEMI  He presented with a 1 week history of dyspnea on exertion and intermittent  exertional non radiating chest pain, without nausea vomiting or diaphoresis.  Admits to missed medication In the ED BP 176/109 and tachycardic to 120, respirations 26 with O2 sat 88% on room air improving to the mid 90s on 2 L. Labs notable for troponin 89-->1194 and BNP 1433. CBC and CMP notable for hyperglycemia of 232. EKG showed sinus tachycardia at 109 with no ST-T wave changes Chest x-ray showed mild patchy opacities in the retrocardiac region and right lower lung which may represent atelectasis or infection. Patient treated with Lasix  40 and started on a heparin  infusion He had no chest pain since arrival in the ED Admission requested     Past Medical History:  Diagnosis Date   CHF (congestive heart failure), NYHA class I, acute on chronic, systolic (HCC)    Concussion 2017   cause of seizure after hit head at work   Difficult intubation    patient unaware of this diagnosis   ED (erectile dysfunction)    Hypothyroidism    Seizures (HCC) 05-20-15   X 1-PT HIT HIS HEAD AT WORK AND THEN HAD A SEIZURE   Past Surgical History:  Procedure Laterality Date   COLONOSCOPY WITH PROPOFOL  N/A 12/07/2018   Procedure: COLONOSCOPY WITH PROPOFOL ;  Surgeon: Therisa Bi,  MD;  Location: [facility] ENDOSCOPY;  Service: Gastroenterology;  Laterality: N/A;   CORONARY/GRAFT ACUTE MI REVASCULARIZATION N/A 08/09/2022   Procedure: Coronary/Graft Acute MI Revascularization;  Surgeon: Mady Bruckner, MD;  Location: [facility] INVASIVE CV LAB;  Service: Cardiovascular;  Laterality: N/A;   FLEXIBLE BRONCHOSCOPY N/A 03/27/2018   Procedure: FLEXIBLE BRONCHOSCOPY With [employer] With C-arm;  Surgeon: Linard Alm NOVAK, MD;  Location: [facility] ORS;  Service: Pulmonary;  Laterality: N/A;   HERNIA REPAIR     as a child,umbilical   LEFT HEART CATH AND CORONARY ANGIOGRAPHY N/A 08/09/2022   Procedure: LEFT HEART CATH AND CORONARY ANGIOGRAPHY;  Surgeon: Mady Bruckner, MD;  Location: [facility] INVASIVE CV LAB;  Service: Cardiovascular;  Laterality: N/A;   SHOULDER ARTHROSCOPY WITH ROTATOR CUFF REPAIR AND SUBACROMIAL DECOMPRESSION Left 09/09/2015   Procedure: SHOULDER ARTHROSCOPY, SUBACROMIAL DECOMPRESSION,BICEPS TENSON RELEASE AND MINI OPEN ROTATOR CUFF REPAIR;  Surgeon: Helayne Glenn, MD;  Location: [facility] ORS;  Service: Orthopedics;  Laterality: Left;   VENTRAL HERNIA REPAIR N/A 10/27/2017   Procedure: HERNIA REPAIR VENTRAL ADULT;  Surgeon: Jordis Laneta FALCON, MD;  Location: [facility] ORS;  Service: General;  Laterality: N/A;   Social History:  reports that he has never smoked. His smokeless tobacco use includes chew. He reports current alcohol use of about 8.0 standard drinks of alcohol per week. He reports that he does not use drugs.  Allergies  Allergen Reactions   Other Other (See Comments)    BRAZILIAN NUTS ONLY-THROAT CLOSES UP  Family History  Problem Relation Age of Onset   Hypertension Mother    Cancer Father    Diabetes Sister    Thyroid cancer Sister     Prior to Admission medications   Medication Sig Start Date End Date Taking? Authorizing Provider  aspirin  81 MG chewable tablet Chew 1 tablet (81 mg total) by mouth daily. Patient not taking: Reported on 07/20/2023 08/13/22   Caleen Burgess BROCKS,  MD  atorvastatin  (LIPITOR ) 80 MG tablet Take 1 tablet (80 mg total) by mouth at bedtime. Patient not taking: Reported on 07/20/2023 08/12/22   Caleen Burgess BROCKS, MD  carvedilol  (COREG ) 3.125 MG tablet Take 3.125 mg by mouth 2 (two) times daily with a meal. Patient not taking: Reported on 07/20/2023    [provider]  dapagliflozin  propanediol (FARXIGA ) 10 MG TABS tablet Take 1 tablet (10 mg total) by mouth daily. Patient not taking: Reported on 07/20/2023 08/13/22   Caleen Burgess BROCKS, MD  folic acid  (FOLVITE ) 1 MG tablet Take 1 tablet (1 mg total) by mouth daily. Patient not taking: Reported on 07/20/2023 08/13/22   Caleen Burgess BROCKS, MD  furosemide  (LASIX ) 40 MG tablet Take 1 tablet (40 mg total) by mouth daily as needed for edema or fluid. Patient not taking: Reported on 07/20/2023 08/12/22   Caleen Burgess BROCKS, MD  levothyroxine  (SYNTHROID ) 175 MCG tablet Take 1 tablet (175 mcg total) by mouth daily before breakfast. Patient not taking: Reported on 07/20/2023 02/07/22   Edman Marsa PARAS, DO  metFORMIN  (GLUCOPHAGE ) 500 MG tablet TAKE 1 TABLET (500 MG TOTAL) BY MOUTH 2 (TWO) TIMES DAILY WITH A MEAL. Patient not taking: Reported on 07/20/2023 10/12/20   Karamalegos, Alexander J, DO  Multiple Vitamin (MULTIVITAMIN WITH MINERALS) TABS tablet Take 1 tablet by mouth daily. Patient not taking: Reported on 09/20/2022 08/13/22   Caleen Burgess BROCKS, MD  sacubitril -valsartan  (ENTRESTO ) 97-103 MG Take 1 tablet by mouth 2 (two) times daily. Patient not taking: Reported on 07/20/2023 08/15/22   Sabharwal, Aditya, DO  spironolactone  (ALDACTONE ) 25 MG tablet Take 0.5 tablets (12.5 mg total) by mouth daily. Patient not taking: Reported on 07/20/2023 08/13/22   Caleen Burgess BROCKS, MD  thiamine  (VITAMIN B-1) 100 MG tablet Take 1 tablet (100 mg total) by mouth daily. Patient not taking: Reported on 07/20/2023 08/13/22   Caleen Burgess BROCKS, MD  ticagrelor  (BRILINTA ) 90 MG TABS tablet Take 90 mg by mouth 2 (two) times daily. Patient not taking: Reported on  07/20/2023    [provider]    Physical Exam: Vitals:   11/29/23 1809 11/29/23 1813 11/29/23 1816  BP: (!) 176/109    Pulse: (!) 120  (!) 114  Resp: (!) 26  (!) 22  Temp: 98.5 F (36.9 C)    TempSrc: Oral    SpO2: (!) 88%  94%  Weight:  99.3 kg   Height:  5' 7 (1.702 m)    Physical Exam Vitals and nursing note reviewed.  Constitutional:      General: He is not in acute distress.    Comments: Conversational dyspnea  HENT:     Head: Normocephalic and atraumatic.  Cardiovascular:     Rate and Rhythm: Regular rhythm. Tachycardia present.     Heart sounds: Normal heart sounds.  Pulmonary:     Effort: Tachypnea present.     Breath sounds: Normal breath sounds.  Abdominal:     Palpations: Abdomen is soft.     Tenderness: There is no abdominal tenderness.  Musculoskeletal:  Right lower leg: 2+ Edema present.     Left lower leg: 2+ Edema present.  Neurological:     Mental Status: Mental status is at baseline.     Labs on Admission: I have personally reviewed following labs and imaging studies  CBC: Recent Labs  Lab 11/29/23 1816  WBC 6.6  HGB 14.7  HCT 44.0  MCV 91.1  PLT 273   Basic Metabolic Panel: Recent Labs  Lab 11/29/23 1816  NA 139  K 3.7  CL 102  CO2 26  GLUCOSE 232*  BUN 14  CREATININE 1.20  CALCIUM  8.8*   GFR: Estimated Creatinine Clearance: 77.2 mL/min (by C-G formula based on SCr of 1.2 mg/dL). Liver Function Tests: Recent Labs  Lab 11/29/23 1816  AST 30  ALT 25  ALKPHOS 65  BILITOT 1.2  PROT 7.8  ALBUMIN 3.7   No results for input(s): LIPASE, AMYLASE in the last 168 hours. No results for input(s): AMMONIA in the last 168 hours. Coagulation Profile: Recent Labs  Lab 11/29/23 2050  INR 1.1   Cardiac Enzymes: No results for input(s): CKTOTAL, CKMB, CKMBINDEX, TROPONINI in the last 168 hours. BNP (last 3 results) No results for input(s): PROBNP in the last 8760 hours. HbA1C: No results for  input(s): HGBA1C in the last 72 hours. CBG: No results for input(s): GLUCAP in the last 168 hours. Lipid Profile: No results for input(s): CHOL, HDL, LDLCALC, TRIG, CHOLHDL, LDLDIRECT in the last 72 hours. Thyroid Function Tests: No results for input(s): TSH, T4TOTAL, FREET4, T3FREE, THYROIDAB in the last 72 hours. Anemia Panel: No results for input(s): VITAMINB12, FOLATE, FERRITIN, TIBC, IRON, RETICCTPCT in the last 72 hours. Urine analysis:    Component Value Date/Time   COLORURINE STRAW (A) 10/01/2017 1214   APPEARANCEUR CLEAR (A) 10/01/2017 1214   LABSPEC 1.013 10/01/2017 1214   PHURINE 7.0 10/01/2017 1214   GLUCOSEU 150 (A) 10/01/2017 1214   HGBUR NEGATIVE 10/01/2017 1214   BILIRUBINUR negative 09/11/2019 1013   KETONESUR NEGATIVE 10/01/2017 1214   PROTEINUR Negative 09/11/2019 1013   PROTEINUR 30 (A) 10/01/2017 1214   UROBILINOGEN 0.2 09/11/2019 1013   NITRITE negative 09/11/2019 1013   NITRITE NEGATIVE 10/01/2017 1214   LEUKOCYTESUR Negative 09/11/2019 1013    Radiological Exams on Admission: DG Chest 2 View Result Date: 11/29/2023 CLINICAL DATA:  Shortness of breath EXAM: CHEST - 2 VIEW COMPARISON:  Chest x-ray 07/20/2023 FINDINGS: Heart is enlarged, unchanged. There is some mild patchy opacities in the retrocardiac region and right lower lung. The there is no pleural effusion or pneumothorax. No acute fractures are seen. IMPRESSION: Mild patchy opacities in the retrocardiac region and right lower lung, which may represent atelectasis or infection. Electronically Signed   By: Greig Pique M.D.   On: 11/29/2023 20:45   Data Reviewed for HPI: Relevant notes from primary care and specialist visits, past discharge summaries as available in EHR, including Care Everywhere. Prior diagnostic testing as pertinent to current admission diagnoses Updated medications and problem lists for reconciliation ED course, including vitals, labs, imaging,  treatment and response to treatment Triage notes, nursing and pharmacy notes and ED provider's notes Notable results as noted above in HPI      Assessment and Plan: * Acute on chronic HFrEF (heart failure with reduced ejection fraction) (HCC) Ischemic cardiomyopathy, EF 25 to 30% 07/2023 Acute respiratory failure with hypoxia Patient clinically fluid overloaded BNP 1500, O2 sats 88 on room air IV Lasix  Continue oxygen Continue carvedilol , spironolactone  Applying Nitropaste Previously on  Entresto  but compliance uncertain Daily weights with intake and output monitoring Cardiology consult to assist with GDMT  NSTEMI (non-ST elevated myocardial infarction) [facility]) History of anterior STEMI 07/2022 s/p LAD stent/single-vessel CAD Patient reporting intermittent chest pain, troponin bump 89--> 1194 Aspirin , statin, beta-blocker Nitroglycerin  sublingual as needed chest pain with morphine  for breakthrough Echo to evaluate for wall motion abnormality Will keep n.p.o. after midnight in case of need for [facility] Cardiology consulted   Essential hypertension Continue carvedilol  and spironolactone   OSA on CPAP CPAP nightly  Hypothyroidism Continue levothyroxine   Alcohol use disorder CIWA withdrawal protocol  Uncontrolled type 2 diabetes mellitus with hyperglycemia, without long-term current use of insulin  (HCC) Sliding scale insulin  coverage  H/O traumatic brain injury/posttraumatic seizure/ headaches No acute issues Not on AEDs As needed pain meds for headache        DVT prophylaxis: Heparin  infusion  Consults: [facility] cardiology  Advance Care Planning:   Code Status: Prior   Family Communication: none  Disposition Plan: Back to previous home environment  Severity of Illness: The appropriate patient status for this patient is OBSERVATION. Observation status is judged to be reasonable and necessary in order to provide the required intensity of service to ensure the patient's  safety. The patient's presenting symptoms, physical exam findings, and initial radiographic and laboratory data in the context of their medical condition is felt to place them at decreased risk for further clinical deterioration. Furthermore, it is anticipated that the patient will be medically stable for discharge from the hospital within 2 midnights of admission.   Author: Delayne LULLA Solian, MD 11/29/2023 9:24 PM  For on call review [email or web address].

## 2023-11-29 NOTE — Assessment & Plan Note (Addendum)
 History of anterior STEMI 07/2022 s/p LAD stent/single-vessel CAD Patient reporting intermittent chest pain, troponin bump 89--> 1194 Aspirin , statin, beta-blocker Nitroglycerin  sublingual as needed chest pain with morphine  for breakthrough Started on heparin  infusion Echocardiogram with EF of 20 to 25% global hypokinesis. Cardiology is taking him for cardiac cath - Follow-up post cath recommendations

## 2023-11-29 NOTE — Assessment & Plan Note (Addendum)
 Ischemic cardiomyopathy, EF 25 to 30% 07/2023 Acute respiratory failure with hypoxia Patient clinically fluid overloaded BNP 1500, O2 sats 88 on room air.  Repeat echocardiogram with EF of 20 to 25% with global hypokinesis and dilated IVC Cardiology is on board Continue with IV Lasix  Continue oxygen Continue carvedilol , spironolactone  Applying Nitropaste Previously on Entresto  but compliance uncertain-cardiology is planning to restart Entresto  after the cardiac cath Daily weights with intake and output monitoring Daily BMP

## 2023-11-29 NOTE — ED Notes (Addendum)
 Lab reports trop 1,194....the patient taken to room 18 by CHRISTELLA Dawn, RN to be placed on card monitor for further evaluation; charge nurse notified of pt's results

## 2023-11-29 NOTE — Assessment & Plan Note (Signed)
 Continue levothyroxine 

## 2023-11-30 ENCOUNTER — Other Ambulatory Visit: Payer: Self-pay

## 2023-11-30 ENCOUNTER — Inpatient Hospital Stay (HOSPITAL_COMMUNITY)
Admit: 2023-11-30 | Discharge: 2023-11-30 | Disposition: A | Discharge status: Home / Self Care | Attending: Internal Medicine | Admitting: Internal Medicine

## 2023-11-30 ENCOUNTER — Encounter: Payer: Self-pay | Admitting: Internal Medicine

## 2023-11-30 ENCOUNTER — Encounter
Admission: EM | Disposition: A | Discharge status: Home / Self Care | Payer: Self-pay | Source: Home / Self Care | Attending: Internal Medicine

## 2023-11-30 DIAGNOSIS — F1090 Alcohol use, unspecified, uncomplicated: Secondary | ICD-10-CM

## 2023-11-30 DIAGNOSIS — E785 Hyperlipidemia, unspecified: Secondary | ICD-10-CM | POA: Diagnosis present

## 2023-11-30 DIAGNOSIS — Z7984 Long term (current) use of oral hypoglycemic drugs: Secondary | ICD-10-CM | POA: Diagnosis not present

## 2023-11-30 DIAGNOSIS — E669 Obesity, unspecified: Secondary | ICD-10-CM | POA: Diagnosis present

## 2023-11-30 DIAGNOSIS — I10 Essential (primary) hypertension: Secondary | ICD-10-CM

## 2023-11-30 DIAGNOSIS — Z833 Family history of diabetes mellitus: Secondary | ICD-10-CM | POA: Diagnosis not present

## 2023-11-30 DIAGNOSIS — G4733 Obstructive sleep apnea (adult) (pediatric): Secondary | ICD-10-CM | POA: Diagnosis present

## 2023-11-30 DIAGNOSIS — R0602 Shortness of breath: Secondary | ICD-10-CM | POA: Diagnosis present

## 2023-11-30 DIAGNOSIS — R7989 Other specified abnormal findings of blood chemistry: Secondary | ICD-10-CM | POA: Diagnosis not present

## 2023-11-30 DIAGNOSIS — Z7982 Long term (current) use of aspirin: Secondary | ICD-10-CM | POA: Diagnosis not present

## 2023-11-30 DIAGNOSIS — E1165 Type 2 diabetes mellitus with hyperglycemia: Secondary | ICD-10-CM

## 2023-11-30 DIAGNOSIS — F1010 Alcohol abuse, uncomplicated: Secondary | ICD-10-CM | POA: Diagnosis present

## 2023-11-30 DIAGNOSIS — I5023 Acute on chronic systolic (congestive) heart failure: Secondary | ICD-10-CM

## 2023-11-30 DIAGNOSIS — I2510 Atherosclerotic heart disease of native coronary artery without angina pectoris: Secondary | ICD-10-CM

## 2023-11-30 DIAGNOSIS — Z6832 Body mass index (BMI) 32.0-32.9, adult: Secondary | ICD-10-CM | POA: Diagnosis not present

## 2023-11-30 DIAGNOSIS — Z87820 Personal history of traumatic brain injury: Secondary | ICD-10-CM

## 2023-11-30 DIAGNOSIS — Z7902 Long term (current) use of antithrombotics/antiplatelets: Secondary | ICD-10-CM | POA: Diagnosis not present

## 2023-11-30 DIAGNOSIS — I252 Old myocardial infarction: Secondary | ICD-10-CM | POA: Diagnosis not present

## 2023-11-30 DIAGNOSIS — Z79890 Hormone replacement therapy (postmenopausal): Secondary | ICD-10-CM | POA: Diagnosis not present

## 2023-11-30 DIAGNOSIS — I214 Non-ST elevation (NSTEMI) myocardial infarction: Secondary | ICD-10-CM

## 2023-11-30 DIAGNOSIS — J9601 Acute respiratory failure with hypoxia: Secondary | ICD-10-CM

## 2023-11-30 DIAGNOSIS — I429 Cardiomyopathy, unspecified: Secondary | ICD-10-CM | POA: Diagnosis not present

## 2023-11-30 DIAGNOSIS — Z8249 Family history of ischemic heart disease and other diseases of the circulatory system: Secondary | ICD-10-CM | POA: Diagnosis not present

## 2023-11-30 DIAGNOSIS — E039 Hypothyroidism, unspecified: Secondary | ICD-10-CM | POA: Diagnosis present

## 2023-11-30 DIAGNOSIS — I255 Ischemic cardiomyopathy: Secondary | ICD-10-CM | POA: Diagnosis present

## 2023-11-30 DIAGNOSIS — I5033 Acute on chronic diastolic (congestive) heart failure: Secondary | ICD-10-CM | POA: Diagnosis not present

## 2023-11-30 DIAGNOSIS — I428 Other cardiomyopathies: Secondary | ICD-10-CM | POA: Diagnosis present

## 2023-11-30 DIAGNOSIS — T82855A Stenosis of coronary artery stent, initial encounter: Secondary | ICD-10-CM | POA: Diagnosis present

## 2023-11-30 DIAGNOSIS — E876 Hypokalemia: Secondary | ICD-10-CM | POA: Diagnosis present

## 2023-11-30 DIAGNOSIS — Y831 Surgical operation with implant of artificial internal device as the cause of abnormal reaction of the patient, or of later complication, without mention of misadventure at the time of the procedure: Secondary | ICD-10-CM | POA: Diagnosis present

## 2023-11-30 DIAGNOSIS — I110 Hypertensive heart disease with heart failure: Secondary | ICD-10-CM | POA: Diagnosis present

## 2023-11-30 DIAGNOSIS — Z79899 Other long term (current) drug therapy: Secondary | ICD-10-CM | POA: Diagnosis not present

## 2023-11-30 HISTORY — PX: LEFT HEART CATH AND CORONARY ANGIOGRAPHY: CATH118249

## 2023-11-30 LAB — ECHOCARDIOGRAM COMPLETE
Area-P 1/2: 5.79 cm2
Calc EF: 25.1 %
Height: 67 in
S' Lateral: 4.7 cm
Single Plane A2C EF: 21.9 %
Single Plane A4C EF: 28.5 %
Weight: 3504 [oz_av]

## 2023-11-30 LAB — CBC
HCT: 47 % (ref 39.0–52.0)
Hemoglobin: 15.9 g/dL (ref 13.0–17.0)
MCH: 30.8 pg (ref 26.0–34.0)
MCHC: 33.8 g/dL (ref 30.0–36.0)
MCV: 91.1 fL (ref 80.0–100.0)
Platelets: 235 K/uL (ref 150–400)
RBC: 5.16 MIL/uL (ref 4.22–5.81)
RDW: 13.2 % (ref 11.5–15.5)
WBC: 7.3 K/uL (ref 4.0–10.5)
nRBC: 0 % (ref 0.0–0.2)

## 2023-11-30 LAB — GLUCOSE, CAPILLARY
Glucose-Capillary: 200 mg/dL — ABNORMAL HIGH (ref 70–99)
Glucose-Capillary: 210 mg/dL — ABNORMAL HIGH (ref 70–99)
Glucose-Capillary: 217 mg/dL — ABNORMAL HIGH (ref 70–99)

## 2023-11-30 LAB — CBG MONITORING, ED
Glucose-Capillary: 111 mg/dL — ABNORMAL HIGH (ref 70–99)
Glucose-Capillary: 114 mg/dL — ABNORMAL HIGH (ref 70–99)
Glucose-Capillary: 134 mg/dL — ABNORMAL HIGH (ref 70–99)
Glucose-Capillary: 147 mg/dL — ABNORMAL HIGH (ref 70–99)

## 2023-11-30 LAB — HEPARIN LEVEL (UNFRACTIONATED)
Heparin Unfractionated: 0.5 [IU]/mL (ref 0.30–0.70)
Heparin Unfractionated: <0.1 [IU]/mL — ABNORMAL LOW (ref 0.30–0.70)

## 2023-11-30 LAB — CREATININE, SERUM
Creatinine, Ser: 0.99 mg/dL (ref 0.61–1.24)
GFR, Estimated: >60 mL/min (ref >60)

## 2023-11-30 LAB — BASIC METABOLIC PANEL WITH GFR
Anion gap: 9 (ref 5–15)
BUN: 15 mg/dL (ref 6–20)
CO2: 27 mmol/L (ref 22–32)
Calcium: 9 mg/dL (ref 8.9–10.3)
Chloride: 104 mmol/L (ref 98–111)
Creatinine, Ser: 1.04 mg/dL (ref 0.61–1.24)
GFR, Estimated: >60 mL/min (ref >60)
Glucose, Bld: 119 mg/dL — ABNORMAL HIGH (ref 70–99)
Potassium: 3 mmol/L — ABNORMAL LOW (ref 3.5–5.1)
Sodium: 140 mmol/L (ref 135–145)

## 2023-11-30 SURGERY — LEFT HEART CATH AND CORONARY ANGIOGRAPHY
Anesthesia: Moderate Sedation

## 2023-11-30 MED ORDER — HEPARIN SODIUM (PORCINE) 1000 UNIT/ML IJ SOLN
INTRAMUSCULAR | Status: AC
  Filled 2023-11-30: qty 10

## 2023-11-30 MED ORDER — FUROSEMIDE 10 MG/ML IJ SOLN
40.0000 mg | Freq: Two times a day (BID) | INTRAMUSCULAR | Status: DC
  Administered 2023-11-30 – 2023-12-01 (×3): 40 mg via INTRAVENOUS
  Filled 2023-11-30 (×3): qty 4

## 2023-11-30 MED ORDER — VERAPAMIL HCL 2.5 MG/ML IV SOLN
INTRAVENOUS | Status: DC | PRN
  Administered 2023-11-30: 2.5 mg via INTRA_ARTERIAL

## 2023-11-30 MED ORDER — SODIUM CHLORIDE 0.9 % IV SOLN
250.0000 mL | INTRAVENOUS | Status: AC | PRN
Start: 2023-11-30 — End: 2023-12-01

## 2023-11-30 MED ORDER — FREE WATER
500.0000 mL | Freq: Once | Status: DC
  Filled 2023-11-30: qty 500

## 2023-11-30 MED ORDER — LIDOCAINE HCL 1 % IJ SOLN
INTRAMUSCULAR | Status: AC
  Filled 2023-11-30: qty 20

## 2023-11-30 MED ORDER — ASPIRIN 81 MG PO CHEW
81.0000 mg | CHEWABLE_TABLET | Freq: Every day | ORAL | Status: DC
  Administered 2023-11-30 – 2023-12-02 (×4): 81 mg via ORAL
  Filled 2023-11-30 (×4): qty 1

## 2023-11-30 MED ORDER — HEPARIN (PORCINE) IN NACL 1000-0.9 UT/500ML-% IV SOLN
INTRAVENOUS | Status: DC | PRN
  Administered 2023-11-30: 1000 mL

## 2023-11-30 MED ORDER — FENTANYL CITRATE (PF) 100 MCG/2ML IJ SOLN
INTRAMUSCULAR | Status: AC
  Filled 2023-11-30: qty 2

## 2023-11-30 MED ORDER — ENOXAPARIN SODIUM 40 MG/0.4ML IJ SOSY
40.0000 mg | PREFILLED_SYRINGE | INTRAMUSCULAR | Status: DC
  Administered 2023-12-01 – 2023-12-02 (×2): 40 mg via SUBCUTANEOUS
  Filled 2023-11-30 (×4): qty 0.4

## 2023-11-30 MED ORDER — VERAPAMIL HCL 2.5 MG/ML IV SOLN
INTRAVENOUS | Status: AC
  Filled 2023-11-30: qty 2

## 2023-11-30 MED ORDER — SODIUM CHLORIDE 0.9% FLUSH: 3.0000 mL | INTRAVENOUS | Status: DC | PRN

## 2023-11-30 MED ORDER — SPIRONOLACTONE 12.5 MG HALF TABLET
12.5000 mg | ORAL_TABLET | Freq: Every day | ORAL | Status: DC
  Administered 2023-11-30 – 2023-12-02 (×3): 12.5 mg via ORAL
  Filled 2023-11-30 (×3): qty 1

## 2023-11-30 MED ORDER — FENTANYL CITRATE (PF) 100 MCG/2ML IJ SOLN
INTRAMUSCULAR | Status: DC | PRN
  Administered 2023-11-30: 25 ug via INTRAVENOUS

## 2023-11-30 MED ORDER — HEPARIN SODIUM (PORCINE) 1000 UNIT/ML IJ SOLN
INTRAMUSCULAR | Status: DC | PRN
  Administered 2023-11-30: 2000 [IU] via INTRAVENOUS

## 2023-11-30 MED ORDER — POTASSIUM CHLORIDE CRYS ER 20 MEQ PO TBCR
40.0000 meq | EXTENDED_RELEASE_TABLET | Freq: Three times a day (TID) | ORAL | Status: AC
  Administered 2023-11-30 (×3): 40 meq via ORAL
  Filled 2023-11-30 (×3): qty 2

## 2023-11-30 MED ORDER — MIDAZOLAM HCL 2 MG/2ML IJ SOLN
INTRAMUSCULAR | Status: DC | PRN
  Administered 2023-11-30: 1 mg via INTRAVENOUS

## 2023-11-30 MED ORDER — ASPIRIN 81 MG PO CHEW: 81.0000 mg | CHEWABLE_TABLET | ORAL | Status: DC

## 2023-11-30 MED ORDER — SODIUM CHLORIDE 0.9% FLUSH
3.0000 mL | Freq: Two times a day (BID) | INTRAVENOUS | Status: DC
  Administered 2023-11-30 – 2023-12-02 (×5): 3 mL via INTRAVENOUS

## 2023-11-30 MED ORDER — IOHEXOL 300 MG/ML  SOLN
INTRAMUSCULAR | Status: DC | PRN
  Administered 2023-11-30: 30 mL

## 2023-11-30 MED ORDER — MIDAZOLAM HCL 2 MG/2ML IJ SOLN
INTRAMUSCULAR | Status: AC
  Filled 2023-11-30: qty 2

## 2023-11-30 MED ORDER — HEPARIN BOLUS VIA INFUSION
2500.0000 [IU] | Freq: Once | INTRAVENOUS | Status: AC
  Administered 2023-11-30: 2500 [IU] via INTRAVENOUS
  Filled 2023-11-30: qty 2500

## 2023-11-30 MED ORDER — HEPARIN (PORCINE) IN NACL 1000-0.9 UT/500ML-% IV SOLN
INTRAVENOUS | Status: AC
  Filled 2023-11-30: qty 1000

## 2023-11-30 MED ORDER — LIDOCAINE HCL (PF) 1 % IJ SOLN
INTRAMUSCULAR | Status: DC | PRN
  Administered 2023-11-30: 2 mL

## 2023-11-30 MED ORDER — FUROSEMIDE 10 MG/ML IJ SOLN
INTRAMUSCULAR | Status: AC
  Filled 2023-11-30: qty 4

## 2023-11-30 MED ORDER — FREE WATER: 250.0000 mL | Freq: Once | Status: DC

## 2023-11-30 SURGICAL SUPPLY — 8 items
CATH INFINITI AMBI 5FR JK (CATHETERS) IMPLANT
DEVICE RAD TR BAND REGULAR (VASCULAR PRODUCTS) IMPLANT
DRAPE BRACHIAL (DRAPES) IMPLANT
GLIDESHEATH SLEND SS 6F .021 (SHEATH) IMPLANT
GUIDEWIRE INQWIRE 1.5J.035X260 (WIRE) IMPLANT
PACK CARDIAC CATH (CUSTOM PROCEDURE TRAY) ×1 IMPLANT
SET ATX-X65L (MISCELLANEOUS) IMPLANT
STATION PROTECTION PRESSURIZED (MISCELLANEOUS) IMPLANT

## 2023-11-30 NOTE — Plan of Care (Signed)
  Problem: Education: Goal: Ability to describe self-care measures that may prevent or decrease complications (Diabetes Survival Skills Education) will improve Outcome: Progressing Goal: Individualized Educational Video(s) Outcome: Progressing   Problem: Coping: Goal: Ability to adjust to condition or change in health will improve Outcome: Progressing   Problem: Fluid Volume: Goal: Ability to maintain a balanced intake and output will improve Outcome: Progressing   Problem: Health Behavior/Discharge Planning: Goal: Ability to identify and utilize available resources and services will improve Outcome: Progressing Goal: Ability to manage health-related needs will improve Outcome: Progressing   Problem: Metabolic: Goal: Ability to maintain appropriate glucose levels will improve Outcome: Progressing   Problem: Nutritional: Goal: Maintenance of adequate nutrition will improve Outcome: Progressing Goal: Progress toward achieving an optimal weight will improve Outcome: Progressing   Problem: Skin Integrity: Goal: Risk for impaired skin integrity will decrease Outcome: Progressing   Problem: Tissue Perfusion: Goal: Adequacy of tissue perfusion will improve Outcome: Progressing   Problem: Education: Goal: Understanding of cardiac disease, CV risk reduction, and recovery process will improve Outcome: Progressing Goal: Individualized Educational Video(s) Outcome: Progressing   Problem: Activity: Goal: Ability to tolerate increased activity will improve Outcome: Progressing   Problem: Cardiac: Goal: Ability to achieve and maintain adequate cardiovascular perfusion will improve Outcome: Progressing   Problem: Health Behavior/Discharge Planning: Goal: Ability to safely manage health-related needs after discharge will improve Outcome: Progressing   Problem: Education: Goal: Understanding of CV disease, CV risk reduction, and recovery process will improve Outcome:  Progressing Goal: Individualized Educational Video(s) Outcome: Progressing   Problem: Activity: Goal: Ability to return to baseline activity level will improve Outcome: Progressing   Problem: Cardiovascular: Goal: Ability to achieve and maintain adequate cardiovascular perfusion will improve Outcome: Progressing Goal: Vascular access site(s) Level 0-1 will be maintained Outcome: Progressing   Problem: Health Behavior/Discharge Planning: Goal: Ability to safely manage health-related needs after discharge will improve Outcome: Progressing   Problem: Education: Goal: Knowledge of General Education information will improve Description: Including pain rating scale, medication(s)/side effects and non-pharmacologic comfort measures Outcome: Progressing   Problem: Health Behavior/Discharge Planning: Goal: Ability to manage health-related needs will improve Outcome: Progressing   Problem: Clinical Measurements: Goal: Ability to maintain clinical measurements within normal limits will improve Outcome: Progressing Goal: Will remain free from infection Outcome: Progressing Goal: Diagnostic test results will improve Outcome: Progressing Goal: Respiratory complications will improve Outcome: Progressing Goal: Cardiovascular complication will be avoided Outcome: Progressing   Problem: Activity: Goal: Risk for activity intolerance will decrease Outcome: Progressing   Problem: Nutrition: Goal: Adequate nutrition will be maintained Outcome: Progressing   Problem: Coping: Goal: Level of anxiety will decrease Outcome: Progressing   Problem: Elimination: Goal: Will not experience complications related to bowel motility Outcome: Progressing Goal: Will not experience complications related to urinary retention Outcome: Progressing   Problem: Pain Managment: Goal: General experience of comfort will improve and/or be controlled Outcome: Progressing   Problem: Safety: Goal: Ability  to remain free from injury will improve Outcome: Progressing   Problem: Skin Integrity: Goal: Risk for impaired skin integrity will decrease Outcome: Progressing

## 2023-11-30 NOTE — Interval H&P Note (Signed)
 History and Physical Interval Note:  11/30/2023 2:46 PM  Shane Frazier.  has presented today for surgery, with the diagnosis of non-ST segment myocardial infarction.  The various methods of treatment have been discussed with the patient and family. After consideration of risks, benefits and other options for treatment, the patient has consented to  Procedure(s): LEFT HEART CATH AND CORONARY ANGIOGRAPHY (N/A) as a surgical intervention.  The patient's history has been reviewed, patient examined, no change in status, stable for surgery.  I have reviewed the patient's chart and labs.  Questions were answered to the patient's satisfaction.     [name]

## 2023-11-30 NOTE — ED Notes (Signed)
 Pharmacy called for infiltration of heparin , no treatment needed, monitor for bleeding.

## 2023-11-30 NOTE — Hospital Course (Addendum)
 Taken from H&P.   Shane Frazier. is a 58 y.o. male with medical history significant for  CAD s/p PCI 2024 for anterior STEMI, , sCHF(EF 25-30% 07/2023), HTN, DM, OSA on CPAP, posttraumatic seizure/chronic headaches,, not on AED, alcohol use, poor outpatient follow up, with last hospitalization in April 2025 for CHF exacerbation, being admitted with CHF exacerbation and NSTEMI  He presented with a 1 week history of dyspnea on exertion and intermittent active substernal, nonradiating chest pain, without nausea vomiting or diaphoresis.  Admits to missed medication.  Does not edition patient was tachycardic with elevated blood pressure at 176/109, saturating 88% on room air so he was placed on 2 L. Labs with troponin of 89>> 94, BNP 1433, blood glucose 232. EKG with sinus tachycardia, no ST-T wave changes Chest x-ray with mild patchy opacities in right lower lung some atelectasis infection.  Patient was started on IV diuresis and heparin  infusion for concern of NSTEMI.  Cardiology was consulted.  8/14: Vital stable on 2 L of oxygen, potassium of 3, creatinine of 1.04. Troponin peaked at 1194, echocardiogram with EF of 20 to 25%, global hypokinesis and moderately dilated LV.  Indeterminate diastolic parameter.  Dilated IVC.  Cardiology is going to take him for cardiac catheterization later today.  Plan is to restart Entresto  and spironolactone  after the cath.  8/15: Vital stable, cardiac cath yesterday with no significant stenosis, patent prior stents, severely reduced EF and severely elevated left ventricular end-diastolic pressure which is consistent with acute on chronic HFrEF and severe volume overload. Lipid panel today with LDL of 122, goal is less than 70.  BMP with increase in creatinine to 1.55 from 0.99.  Cardiology would like to continue with IV diuresis.  8/16: Remained hemodynamically stable, on room air.  Clinically appears euvolemic, cardiology switched him to p.o. Lasix ,  decrease the dose  of Entresto  and added carvedilol .  Patient will continue rest of his home medications and follow-up with his providers closely management.

## 2023-11-30 NOTE — Progress Notes (Signed)
 Approximately 1730--Pt arrived to room 255A from Cath lab. Upon arrival, VS obtained and assessment completed by this RN. Telemetry connected and notified. Fall precautions in place.

## 2023-11-30 NOTE — Progress Notes (Signed)
 Progress Note   Patient: Shane Frazier. FMW:[id number] DOB: 1965/07/31 DOA: 11/29/2023     0 DOS: the patient was seen and examined on 11/30/2023   Brief hospital course: Taken from H&P.   Aundrey Elahi. is a 58 y.o. male with medical history significant for  CAD s/p PCI 2024 for anterior STEMI, , sCHF(EF 25-30% 07/2023), HTN, DM, OSA on CPAP, posttraumatic seizure/chronic headaches,, not on AED, alcohol use, poor outpatient follow up, with last hospitalization in April 2025 for CHF exacerbation, being admitted with CHF exacerbation and NSTEMI  He presented with a 1 week history of dyspnea on exertion and intermittent active substernal, nonradiating chest pain, without nausea vomiting or diaphoresis.  Admits to missed medication.  Does not edition patient was tachycardic with elevated blood pressure at 176/109, saturating 88% on room air so he was placed on 2 L. Labs with troponin of 89>> 94, BNP 1433, blood glucose 232. EKG with sinus tachycardia, no ST-T wave changes Chest x-ray with mild patchy opacities in right lower lung some atelectasis infection.  Patient was started on IV diuresis and heparin  infusion for concern of NSTEMI.  Cardiology was consulted.  8/14: Vital stable on 2 L of oxygen, potassium of 3, creatinine of 1.04. Troponin peaked at 1194, echocardiogram with EF of 20 to 25%, global hypokinesis and moderately dilated LV.  Indeterminate diastolic parameter.  Dilated IVC.  Cardiology is going to take him for cardiac catheterization later today.  Plan is to restart Entresto  and spironolactone  after the cath.  Assessment and Plan: * Acute on chronic HFrEF (heart failure with reduced ejection fraction) (HCC) Ischemic cardiomyopathy, EF 25 to 30% 07/2023 Acute respiratory failure with hypoxia Patient clinically fluid overloaded BNP 1500, O2 sats 88 on room air.  Repeat echocardiogram with EF of 20 to 25% with global hypokinesis and dilated IVC Cardiology is on board Continue with IV  Lasix  Continue oxygen Continue carvedilol , spironolactone  Applying Nitropaste Previously on Entresto  but compliance uncertain-cardiology is planning to restart Entresto  after the cardiac cath Daily weights with intake and output monitoring Daily BMP  NSTEMI (non-ST elevated myocardial infarction) (HCC) History of anterior STEMI 07/2022 s/p LAD stent/single-vessel CAD Patient reporting intermittent chest pain, troponin bump 89--> 1194 Aspirin , statin, beta-blocker Nitroglycerin  sublingual as needed chest pain with morphine  for breakthrough Started on heparin  infusion Echocardiogram with EF of 20 to 25% global hypokinesis. Cardiology is taking him for cardiac cath - Follow-up post cath recommendations   Essential hypertension Continue carvedilol  and spironolactone  Cardiology is also planning to resume Entresto  after the cath  Uncontrolled type 2 diabetes mellitus with hyperglycemia, without long-term current use of insulin  (HCC) Sliding scale insulin  coverage  OSA on CPAP CPAP nightly  Hypothyroidism Continue levothyroxine   Alcohol use disorder CIWA withdrawal protocol  H/O traumatic brain injury/posttraumatic seizure/ headaches No acute issues Not on AEDs As needed pain meds for headache   Subjective: Patient was seen and examined today.  Denies any chest pain and shortness of breath improving.  Physical Exam: Vitals:   11/30/23 0945 11/30/23 1009 11/30/23 1300 11/30/23 1400  BP: (!) 142/112 127/81 135/89 (!) 148/107  Pulse: 87 90 82 89  Resp: 16 (!) 28 (!) 35 (!) 21  Temp:    98.8 F (37.1 C)  TempSrc:    Oral  SpO2: 99% 98% 99% 96%  Weight:      Height:       General.  Obese gentleman, in no acute distress. Pulmonary.  Few basal crackles bilaterally, normal  respiratory effort. CV.  Regular rate and rhythm, no JVD, rub or murmur. Abdomen.  Soft, nontender, nondistended, BS positive. CNS.  Alert and oriented .  No focal neurologic deficit. Extremities.   Trace LE edema, no cyanosis, pulses intact and symmetrical. Psychiatry.  Judgment and insight appears normal.   Data Reviewed: Prior data reviewed  Family Communication: Discussed with patient  Disposition: Status is: Inpatient Remains inpatient appropriate because: Severity of illness  Planned Discharge Destination: Home  DVT prophylaxis.  Heparin  infusion Time spent: 50 minutes  This record has been created using Conservation officer, historic buildings. Errors have been sought and corrected,but may not always be located. Such creation errors do not reflect on the standard of care.   Author: Amaryllis Dare, MD 11/30/2023 2:30 PM  For on call review [email or web address].

## 2023-11-30 NOTE — Consult Note (Signed)
 Cardiology Consultation:   Patient ID: Shane Frazier.; [id number]; [birth year]   Admit date: 11/29/2023 Date of Consult: 11/30/2023  Primary Care Provider: Edman Marsa PARAS, DO Primary Cardiologist: Perla Primary Electrophysiologist:  None Advanced Heart Failure: [place]   Patient Profile:   Shane Frazier. is a 58 y.o. male with a hx of CAD with anterior ST elevation MI in 07/2022 s/p PCI/DES to the LAD, HFrEF secondary to mixed NICM and ICM, DM2, HTN, HLD, brain trauma related seizure in 2017, medication noncompliance, and ongoing alcohol abuse who is being seen today for the evaluation of NSTEMI at the request of Dr. Cleatus.  History of Present Illness:   Mr. [name] was admitted to the hospital in 07/2021 with an anterior to elevation MI and acute HFrEF secondary to ICM.  LHC showed severe single-vessel CAD with thrombotic occlusion of the distal LAD with 50% stenosis at the ostium of the RCA which appeared to be nonobstructive.  LVEF 25 to 35%.  The severely reduced LV systolic function appeared to be out of proportion to underlying CAD.  He underwent successful PCI/DES to the LAD.  Echo during admission showed an EF of 5 to 30%, complex kinesis, mild LVH, grade 1 diastolic dysfunction, low normal RV systolic function with normal ventricular cavity size, and no significant valvular abnormality.  Cardiac MRI in 07/2022 showed an EF of 23% with near transmural apical LGE/scar, LV septal mid wall LGE at RV insertion site, severely reduced RV systolic function.  Findings were suggestive of prior LAD infarct and mixed ischemic and nonischemic cardiomyopathy.  Following discharge, he was evaluated by the advanced heart failure team in 09/2018 for most recently and has been lost to follow-up since.  At that time it was recommended he take Entresto , carvedilol , spironolactone , Farxiga , and furosemide  on an as-needed basis.  He was admitted in 07/2023 with volume overload secondary to medication plan  and diuresed by the medicine service.  Echo during the admission showed a persistent cardiomyopathy with an EF of 25 to 30%, global hypokinesis, moderate LVH, mildly reduced RV systolic function with mildly enlarged ventricular cavity size, no significant valvular abnormality, and an estimated right atrial pressure of 3 mmHg.  He was admitted to [facility] on 11/29/2023 with a 1 to 2-day history of increased exertional shortness of breath, wheezing, and cough with symptoms noted to be significantly worse while at court on 8/13 prompting ER evaluation.  He had been nonadherent to pharmacotherapy.  Hypertensive upon arrival with a BP of 176/109, tachycardic with a rate of 120 bpm, tachypneic with respirations 26%, and hypoxic with oxygen saturation 88% on room air improving to the mid 90s on 2 L supplemental oxygen.  Notable labs include initial and peak high-sensitivity troponin 1194 trending to 1135, BNP 1433.  Chest x-ray with mild patchy opacities in the retrocardiac region and right lower lung representative of atelectasis versus infection.  Was given IV Lasix , Nitropaste, and started on IV heparin .  At time of cardiology consult he is without symptoms of angina at rest.  He reports regularly, and has been out of all medications for the past several weeks.    Past Medical History:  Diagnosis Date   CHF (congestive heart failure), NYHA class I, acute on chronic, systolic (HCC)    Concussion 2017   cause of seizure after hit head at work   Difficult intubation    patient unaware of this diagnosis   ED (erectile dysfunction)    Hypothyroidism  Seizures (HCC) 05-20-15   X 1-PT HIT HIS HEAD AT WORK AND THEN HAD A SEIZURE    Past Surgical History:  Procedure Laterality Date   COLONOSCOPY WITH PROPOFOL  N/A 12/07/2018   Procedure: COLONOSCOPY WITH PROPOFOL ;  Surgeon: Therisa Bi, MD;  Location: [facility] ENDOSCOPY;  Service: Gastroenterology;  Laterality: N/A;   CORONARY/GRAFT ACUTE MI REVASCULARIZATION N/A  08/09/2022   Procedure: Coronary/Graft Acute MI Revascularization;  Surgeon: Mady Bruckner, MD;  Location: [facility] INVASIVE CV LAB;  Service: Cardiovascular;  Laterality: N/A;   FLEXIBLE BRONCHOSCOPY N/A 03/27/2018   Procedure: FLEXIBLE BRONCHOSCOPY With [employer] With C-arm;  Surgeon: Linard Alm NOVAK, MD;  Location: [facility] ORS;  Service: Pulmonary;  Laterality: N/A;   HERNIA REPAIR     as a child,umbilical   LEFT HEART CATH AND CORONARY ANGIOGRAPHY N/A 08/09/2022   Procedure: LEFT HEART CATH AND CORONARY ANGIOGRAPHY;  Surgeon: Mady Bruckner, MD;  Location: [facility] INVASIVE CV LAB;  Service: Cardiovascular;  Laterality: N/A;   SHOULDER ARTHROSCOPY WITH ROTATOR CUFF REPAIR AND SUBACROMIAL DECOMPRESSION Left 09/09/2015   Procedure: SHOULDER ARTHROSCOPY, SUBACROMIAL DECOMPRESSION,BICEPS TENSON RELEASE AND MINI OPEN ROTATOR CUFF REPAIR;  Surgeon: Helayne Glenn, MD;  Location: [facility] ORS;  Service: Orthopedics;  Laterality: Left;   VENTRAL HERNIA REPAIR N/A 10/27/2017   Procedure: HERNIA REPAIR VENTRAL ADULT;  Surgeon: Jordis Laneta FALCON, MD;  Location: [facility] ORS;  Service: General;  Laterality: N/A;     Home Meds: Prior to Admission medications   Medication Sig Start Date End Date Taking? Authorizing Provider  aspirin  81 MG chewable tablet Chew 1 tablet (81 mg total) by mouth daily. 08/13/22  Yes Amin, Ankit C, MD  atorvastatin  (LIPITOR ) 80 MG tablet Take 1 tablet (80 mg total) by mouth at bedtime. 08/12/22  Yes Amin, Ankit C, MD  carvedilol  (COREG ) 3.125 MG tablet Take 3.125 mg by mouth 2 (two) times daily with a meal.   Yes [provider]  dapagliflozin  propanediol (FARXIGA ) 10 MG TABS tablet Take 1 tablet (10 mg total) by mouth daily. 08/13/22  Yes Amin, Ankit C, MD  folic acid  (FOLVITE ) 1 MG tablet Take 1 tablet (1 mg total) by mouth daily. 08/13/22  Yes Amin, Ankit C, MD  furosemide  (LASIX ) 40 MG tablet Take 1 tablet (40 mg total) by mouth daily as needed for edema or fluid. 08/12/22  Yes Amin,  Ankit C, MD  levothyroxine  (SYNTHROID ) 175 MCG tablet Take 1 tablet (175 mcg total) by mouth daily before breakfast. 02/07/22  Yes Karamalegos, Marsa PARAS, DO  sacubitril -valsartan  (ENTRESTO ) 49-51 MG Take 1 tablet by mouth 2 (two) times daily.   Yes [provider]  spironolactone  (ALDACTONE ) 25 MG tablet Take 0.5 tablets (12.5 mg total) by mouth daily. 08/13/22  Yes Amin, Ankit C, MD  thiamine  (VITAMIN B-1) 100 MG tablet Take 1 tablet (100 mg total) by mouth daily. 08/13/22  Yes Amin, Ankit C, MD  ticagrelor  (BRILINTA ) 90 MG TABS tablet Take 90 mg by mouth 2 (two) times daily.   Yes [provider]  metFORMIN  (GLUCOPHAGE ) 500 MG tablet TAKE 1 TABLET (500 MG TOTAL) BY MOUTH 2 (TWO) TIMES DAILY WITH A MEAL. Patient not taking: Reported on 07/20/2023 10/12/20   Karamalegos, Alexander J, DO  Multiple Vitamin (MULTIVITAMIN WITH MINERALS) TABS tablet Take 1 tablet by mouth daily. Patient not taking: Reported on 09/20/2022 08/13/22   Caleen Burgess BROCKS, MD  sacubitril -valsartan  (ENTRESTO ) 97-103 MG Take 1 tablet by mouth 2 (two) times daily. Patient not taking: Reported on 07/20/2023 08/15/22  Sabharwal, Aditya, DO    Inpatient Medications: Scheduled Meds:  aspirin   81 mg Oral Daily   atorvastatin   80 mg Oral QHS   carvedilol   3.125 mg Oral BID WC   folic acid   1 mg Oral Daily   insulin  aspart  0-15 Units Subcutaneous Q4H   levothyroxine   175 mcg Oral Q0600   multivitamin with minerals  1 tablet Oral Daily   nitroGLYCERIN   1 inch Topical Q6H   spironolactone   12.5 mg Oral Daily   thiamine   100 mg Oral Daily   Or   thiamine   100 mg Intravenous Daily   Continuous Infusions:  heparin  1,200 Units/hr (11/30/23 0733)   PRN Meds: acetaminophen , LORazepam  **OR** LORazepam , ondansetron  (ZOFRAN ) IV  Allergies:   Allergies  Allergen Reactions   Other Other (See Comments)    BRAZILIAN NUTS ONLY-THROAT CLOSES UP    Social History:   Social History   Socioeconomic History   Marital  status: Married    Spouse name: Not on file   Number of children: Not on file   Years of education: Not on file   Highest education level: Not on file  Occupational History   Not on file  Tobacco Use   Smoking status: Never   Smokeless tobacco: Current    Types: Chew  Vaping Use   Vaping status: Never Used  Substance and Sexual Activity   Alcohol use: Yes    Alcohol/week: 8.0 standard drinks of alcohol    Types: 8 Shots of liquor per week    Comment: drinks a couple of pints of wine daily   Drug use: No   Sexual activity: Yes  Other Topics Concern   Not on file  Social History Narrative   Not on file   Social Drivers of Health   Financial Resource Strain: Not on file  Food Insecurity: No Food Insecurity (07/22/2023)   Hunger Vital Sign    Worried About Running Out of Food in the Last Year: Never true    Ran Out of Food in the Last Year: Never true  Transportation Needs: Unmet Transportation Needs (07/21/2023)   PRAPARE - Administrator, Civil Service (Medical): Yes    Lack of Transportation (Non-Medical): No  Physical Activity: Not on file  Stress: Not on file  Social Connections: Not on file  Intimate Partner Violence: Not At Risk (07/22/2023)   Humiliation, Afraid, Rape, and Kick questionnaire    Fear of Current or Ex-Partner: No    Emotionally Abused: No    Physically Abused: No    Sexually Abused: No     Family History:   Family History  Problem Relation Age of Onset   Hypertension Mother    Cancer Father    Diabetes Sister    Thyroid cancer Sister     ROS:  Review of Systems  Constitutional:  Positive for malaise/fatigue. Negative for chills, diaphoresis, fever and weight loss.  HENT:  Negative for congestion.   Eyes:  Negative for discharge and redness.  Respiratory:  Positive for cough and shortness of breath. Negative for sputum production and wheezing.   Cardiovascular:  Positive for chest pain. Negative for palpitations, orthopnea,  claudication, leg swelling and PND.  Gastrointestinal:  Positive for nausea and vomiting. Negative for abdominal pain and heartburn.  Musculoskeletal:  Negative for falls and myalgias.  Skin:  Negative for rash.  Neurological:  Positive for weakness. Negative for dizziness, tingling, tremors, sensory change, speech change, focal weakness and loss  of consciousness.  Endo/Heme/Allergies:  Does not bruise/bleed easily.  Psychiatric/Behavioral:  Negative for substance abuse. The patient is not nervous/anxious.   All other systems reviewed and are negative.     Physical Exam/Data:   Vitals:   11/30/23 0536 11/30/23 0553 11/30/23 0600 11/30/23 0732  BP: (!) 144/86  (!) 123/92   Pulse: 87 86 85   Resp: (!) 22 17 15    Temp:    98.4 F (36.9 C)  TempSrc:    Oral  SpO2:  97% 94%   Weight:      Height:        Intake/Output Summary (Last 24 hours) at 11/30/2023 0753 Last data filed at 11/30/2023 0548 Gross per 24 hour  Intake 144.09 ml  Output 4000 ml  Net -3855.91 ml   Filed Weights   11/29/23 1813  Weight: 99.3 kg   Body mass index is 34.3 kg/m.   Physical Exam: General: Well developed, well nourished, in no acute distress. Head: Normocephalic, atraumatic, sclera non-icteric, no xanthomas, nares without discharge.  Neck: Negative for carotid bruits. JVD not elevated. Lungs: Clear bilaterally to auscultation without wheezes, rales, or rhonchi. Breathing is unlabored. Heart: RRR with S1 S2. No murmurs, rubs, or gallops appreciated. Abdomen: Soft, non-tender, non-distended with normoactive bowel sounds. No hepatomegaly. No rebound/guarding. No obvious abdominal masses. Msk:  Strength and tone appear normal for age. Extremities: No clubbing or cyanosis. Mild bilateral pretibial edema.  Neuro: Alert and oriented X 3. No facial asymmetry. No focal deficit. Moves all extremities spontaneously. Psych:  Responds to questions appropriately with a normal affect.   EKG:  The EKG was  personally reviewed and demonstrates: Sinus tachycardia, 109 bpm, left axis deviation, poor R wave progression along the precordial leads, LVH, nonspecific ST-T changes.  Repeat EKG shows sinus rhythm with LAFB, rare PVC, nonspecific ST-T changes Telemetry:  Telemetry was personally reviewed and demonstrates: SR  Weights: Filed Weights   11/29/23 1813  Weight: 99.3 kg    Relevant CV Studies:  2D echo 03/26/2018: - Left ventricle: The cavity size was mildly dilated. Wall    thickness was normal. Systolic function was mildly to moderately    reduced. The estimated ejection fraction was in the range of 40%    to 45%. Diffuse hypokinesis. The study is not technically    sufficient to allow evaluation of LV diastolic function.  - Mitral valve: There was mild regurgitation.  - Left atrium: The atrium was moderately dilated.  - Pulmonary arteries: Systolic pressure could not be accurately    estimated.  - Pericardium, extracardiac: A trivial pericardial effusion was    identified.  __________  [facility] 08/09/2022: Conclusions: Severe single-vessel coronary artery disease with thrombotic occlusion of the distal LAD.  There is 50% stenosis at the ostium of the RCA, which does not appear obstructive. Severely reduced left ventricular systolic function (LVEF 25-35%) with severely elevated filling pressure (LVEDP 40 mmHg).  Degree of LVEF is out of proportion to CAD, suggestive of predominantly nonischemic cardiomyopathy. Successful PCI to distal LAD occlusion using Onyx Frontier 2.25 x 18 mm drug-eluting stent with 0% residual stenosis and TIMI-3 flow.   Recommendations: Dual antiplatelet therapy with aspirin  and ticagrelor  for at least 12 months. Obtain echocardiogram to reassess LVEF and exclude LV thrombus in the setting of distal LAD occlusion and severely reduced LVEF. Continue diuresis and judicious escalation of goal-directed medical therapy as tolerated. Discontinue cangrelor  infusion 2  hours after ticagrelor  load was given (infusion to end at 1  PM). __________  2D echo 08/10/2022: 1. Left ventricular ejection fraction, by estimation, is 25 to 30%. The  left ventricle has severely decreased function. The left ventricle  demonstrates global hypokinesis. There is mild left ventricular  hypertrophy. Left ventricular diastolic parameters   are consistent with Grade I diastolic dysfunction (impaired relaxation).   2. Right ventricular systolic function is low normal. The right  ventricular size is normal.   3. The mitral valve is normal in structure. No evidence of mitral valve  regurgitation.   4. The aortic valve is grossly normal. Aortic valve regurgitation is not  visualized.  __________  cMRI 08/12/2022: IMPRESSION: 1. Mildly dilated LV, severely reduced LV systolic function. LVEF 23%. 2. Near transmural apical LGE/scar. 3. LV septal midwall LGE at RV insertion sites. 4. Severely reduced RV function. 5. Findings suggest prior LAD infarct. 6. Mixed ischemic and non-ischemic cardiomyopathy. __________  2D echo 07/21/2023: 1. Left ventricular ejection fraction, by estimation, is 25 to 30%. Left  ventricular ejection fraction by 2D MOD biplane is 26.5 %. The left  ventricle has severely decreased function. The left ventricle demonstrates  global hypokinesis. There is moderate   left ventricular hypertrophy. Left ventricular diastolic parameters are  indeterminate.   2. Right ventricular systolic function is mildly reduced. The right  ventricular size is mildly enlarged.   3. The mitral valve is normal in structure. No evidence of mitral valve  regurgitation.   4. The aortic valve is tricuspid. Aortic valve regurgitation is not  visualized.   5. The inferior vena cava is normal in size with greater than 50%  respiratory variability, suggesting right atrial pressure of 3 mmHg.    Laboratory Data:  Chemistry Recent Labs  Lab 11/29/23 1816 11/30/23 0312  NA  139 140  K 3.7 3.0*  CL 102 104  CO2 26 27  GLUCOSE 232* 119*  BUN 14 15  CREATININE 1.20 1.04  CALCIUM  8.8* 9.0  GFRNONAA >60 >60  ANIONGAP 11 9    Recent Labs  Lab 11/29/23 1816  PROT 7.8  ALBUMIN 3.7  AST 30  ALT 25  ALKPHOS 65  BILITOT 1.2   Hematology Recent Labs  Lab 11/29/23 1816  WBC 6.6  RBC 4.83  HGB 14.7  HCT 44.0  MCV 91.1  MCH 30.4  MCHC 33.4  RDW 13.2  PLT 273   Cardiac EnzymesNo results for input(s): TROPONINI in the last 168 hours. No results for input(s): TROPIPOC in the last 168 hours.  BNP Recent Labs  Lab 11/29/23 1816  BNP 1,433.4*    DDimer No results for input(s): DDIMER in the last 168 hours.  Radiology/Studies:  DG Chest 2 View Result Date: 11/29/2023 IMPRESSION: Mild patchy opacities in the retrocardiac region and right lower lung, which may represent atelectasis or infection. Electronically Signed   By: Greig Pique M.D.   On: 11/29/2023 20:45    Assessment and Plan:   1. CAD involving the native coronary arteries with NSTEMI: - Likely exacerbated by acute on chronic HFrEF with medication noncompliance - Currently without symptoms of angina at rest - Heparin  drip - ASA 81 mg - NPO - Plan for LHC this afternoon - Resume PTA atorvastatin  as outlined below  2. Acute on chronic HFrEF secondary to mixed ICM and NICM: - In the setting of medication noncompliance and ongoing alcohol abuse - Not a candidate for advanced heart failure therapies secondary to ongoing alcohol abuse and medication noncompliance -Resume PTA carvedilol  3.25 mg twice daily and  spironolactone  12.5 mg daily with escalation as tolerated - Following cardiac cath, anticipate further diuresis along with the addition of ARNI and SGLT2 inhibitor - 2D echo has been ordered by admitting service, suspect this will continue to show significant cardiomyopathy - Daily weights with strict I's and O's  3. HTN: - Blood pressure improving, though remains  elevated - Resuming carvedilol  and spironolactone  with recommendation to further escalate pharmacotherapy as outlined above following cardiac cath  4. HLD: - LDL 181 in 07/2022 with LP(a) 158 at that time - Update fasting lipid panel - Resume PTA atorvastatin  80 mg with recommendation to escalate the below therapy as indicated to achieve target LDL less than 55  5. Alcohol abuse: - Cessation advised - Contributing to admission  6. Medication noncompliance: - Adherence recommended, largely precipitated admission  7. Hypokalemia: - Replete to goal 4.0 - Resume PTA spironolactone    Informed Consent   Shared Decision Making/Informed Consent{  The risks [stroke (1 in 1000), death (1 in 1000), kidney failure [usually temporary] (1 in 500), bleeding (1 in 200), allergic reaction [possibly serious] (1 in 200)], benefits (diagnostic support and management of coronary artery disease) and alternatives of a cardiac catheterization were discussed in detail with Mr. Shankles and he is willing to proceed.       For questions or updates, please contact [facility] Please consult [email or web address] for contact info under Cardiology/STEMI.   Signed, Bernardino Bring, PA-C [facility] Pager: [phone] 11/30/2023, 7:53 AM

## 2023-11-30 NOTE — Progress Notes (Signed)
 Heart Failure Navigator Progress Note  Assessed for Heart & Vascular TOC clinic readiness.  Patient does not meet criteria due to current Advanced Heart Failure Team patient of Dr. Toribio Fuel, MD.   Navigator will sign of at this time.  Charmaine Pines, RN, BSN [facility] Heart Failure Navigator Secure Chat Only

## 2023-11-30 NOTE — H&P (View-Only) (Signed)
 Cardiology Consultation:   Patient ID: Shane Frazier.; [id number]; 01-01-1966   Admit date: 11/29/2023 Date of Consult: 11/30/2023  Primary Care Provider: Edman Marsa PARAS, DO Primary Cardiologist: [place] Primary Electrophysiologist:  None Advanced Heart Failure: [place]   Patient Profile:   Shane Frazier. is a 58 y.o. male with a hx of CAD with anterior ST elevation MI in 07/2022 s/p PCI/DES to the LAD, HFrEF secondary to mixed NICM and ICM, DM2, HTN, HLD, brain trauma related seizure in 2017, medication noncompliance, and ongoing alcohol abuse who is being seen today for the evaluation of NSTEMI at the request of Dr. Cleatus.  History of Present Illness:   Shane Frazier was admitted to the hospital in 07/2021 with an anterior to elevation MI and acute HFrEF secondary to ICM.  LHC showed severe single-vessel CAD with thrombotic occlusion of the distal LAD with 50% stenosis at the ostium of the RCA which appeared to be nonobstructive.  LVEF 25 to 35%.  The severely reduced LV systolic function appeared to be out of proportion to underlying CAD.  He underwent successful PCI/DES to the LAD.  Echo during admission showed an EF of 5 to 30%, complex kinesis, mild LVH, grade 1 diastolic dysfunction, low normal RV systolic function with normal ventricular cavity size, and no significant valvular abnormality.  Cardiac MRI in 07/2022 showed an EF of 23% with near transmural apical LGE/scar, LV septal mid wall LGE at RV insertion site, severely reduced RV systolic function.  Findings were suggestive of prior LAD infarct and mixed ischemic and nonischemic cardiomyopathy.  Following discharge, he was evaluated by the advanced heart failure team in 09/2018 for most recently and has been lost to follow-up since.  At that time it was recommended he take Entresto , carvedilol , spironolactone , Farxiga , and furosemide  on an as-needed basis.  He was admitted in 07/2023 with volume overload secondary to medication plan  and diuresed by the medicine service.  Echo during the admission showed a persistent cardiomyopathy with an EF of 25 to 30%, global hypokinesis, moderate LVH, mildly reduced RV systolic function with mildly enlarged ventricular cavity size, no significant valvular abnormality, and an estimated right atrial pressure of 3 mmHg.  He was admitted to [facility] on 11/29/2023 with a 1 to 2-day history of increased exertional shortness of breath, wheezing, and cough with symptoms noted to be significantly worse while at court on 8/13 prompting ER evaluation.  He had been nonadherent to pharmacotherapy.  Hypertensive upon arrival with a BP of 176/109, tachycardic with a rate of 120 bpm, tachypneic with respirations 26%, and hypoxic with oxygen saturation 88% on room air improving to the mid 90s on 2 L supplemental oxygen.  Notable labs include initial and peak high-sensitivity troponin 1194 trending to 1135, BNP 1433.  Chest x-ray with mild patchy opacities in the retrocardiac region and right lower lung representative of atelectasis versus infection.  Was given IV Lasix , Nitropaste, and started on IV heparin .  At time of cardiology consult he is without symptoms of angina at rest.  He reports regularly, and has been out of all medications for the past several weeks.    Past Medical History:  Diagnosis Date   CHF (congestive heart failure), NYHA class I, acute on chronic, systolic (HCC)    Concussion 2017   cause of seizure after hit head at work   Difficult intubation    patient unaware of this diagnosis   ED (erectile dysfunction)    Hypothyroidism  Seizures (HCC) 05-20-15   X 1-PT HIT HIS HEAD AT WORK AND THEN HAD A SEIZURE    Past Surgical History:  Procedure Laterality Date   COLONOSCOPY WITH PROPOFOL  N/A 12/07/2018   Procedure: COLONOSCOPY WITH PROPOFOL ;  Surgeon: Therisa Bi, MD;  Location: [facility] ENDOSCOPY;  Service: Gastroenterology;  Laterality: N/A;   CORONARY/GRAFT ACUTE MI REVASCULARIZATION N/A  08/09/2022   Procedure: Coronary/Graft Acute MI Revascularization;  Surgeon: Mady Bruckner, MD;  Location: [facility] INVASIVE CV LAB;  Service: Cardiovascular;  Laterality: N/A;   FLEXIBLE BRONCHOSCOPY N/A 03/27/2018   Procedure: FLEXIBLE BRONCHOSCOPY With [employer] With C-arm;  Surgeon: Linard Alm NOVAK, MD;  Location: [facility] ORS;  Service: Pulmonary;  Laterality: N/A;   HERNIA REPAIR     as a child,umbilical   LEFT HEART CATH AND CORONARY ANGIOGRAPHY N/A 08/09/2022   Procedure: LEFT HEART CATH AND CORONARY ANGIOGRAPHY;  Surgeon: Mady Bruckner, MD;  Location: [facility] INVASIVE CV LAB;  Service: Cardiovascular;  Laterality: N/A;   SHOULDER ARTHROSCOPY WITH ROTATOR CUFF REPAIR AND SUBACROMIAL DECOMPRESSION Left 09/09/2015   Procedure: SHOULDER ARTHROSCOPY, SUBACROMIAL DECOMPRESSION,BICEPS TENSON RELEASE AND MINI OPEN ROTATOR CUFF REPAIR;  Surgeon: Helayne Glenn, MD;  Location: [facility] ORS;  Service: Orthopedics;  Laterality: Left;   VENTRAL HERNIA REPAIR N/A 10/27/2017   Procedure: HERNIA REPAIR VENTRAL ADULT;  Surgeon: Jordis Laneta FALCON, MD;  Location: [facility] ORS;  Service: General;  Laterality: N/A;     Home Meds: Prior to Admission medications   Medication Sig Start Date End Date Taking? Authorizing Provider  aspirin  81 MG chewable tablet Chew 1 tablet (81 mg total) by mouth daily. 08/13/22  Yes Amin, Ankit C, MD  atorvastatin  (LIPITOR ) 80 MG tablet Take 1 tablet (80 mg total) by mouth at bedtime. 08/12/22  Yes Amin, Ankit C, MD  carvedilol  (COREG ) 3.125 MG tablet Take 3.125 mg by mouth 2 (two) times daily with a meal.   Yes [provider]  dapagliflozin  propanediol (FARXIGA ) 10 MG TABS tablet Take 1 tablet (10 mg total) by mouth daily. 08/13/22  Yes Amin, Ankit C, MD  folic acid  (FOLVITE ) 1 MG tablet Take 1 tablet (1 mg total) by mouth daily. 08/13/22  Yes Amin, Ankit C, MD  furosemide  (LASIX ) 40 MG tablet Take 1 tablet (40 mg total) by mouth daily as needed for edema or fluid. 08/12/22  Yes Amin,  Ankit C, MD  levothyroxine  (SYNTHROID ) 175 MCG tablet Take 1 tablet (175 mcg total) by mouth daily before breakfast. 02/07/22  Yes Karamalegos, Marsa PARAS, DO  sacubitril -valsartan  (ENTRESTO ) 49-51 MG Take 1 tablet by mouth 2 (two) times daily.   Yes [provider]  spironolactone  (ALDACTONE ) 25 MG tablet Take 0.5 tablets (12.5 mg total) by mouth daily. 08/13/22  Yes Amin, Ankit C, MD  thiamine  (VITAMIN B-1) 100 MG tablet Take 1 tablet (100 mg total) by mouth daily. 08/13/22  Yes Amin, Ankit C, MD  ticagrelor  (BRILINTA ) 90 MG TABS tablet Take 90 mg by mouth 2 (two) times daily.   Yes [provider]  metFORMIN  (GLUCOPHAGE ) 500 MG tablet TAKE 1 TABLET (500 MG TOTAL) BY MOUTH 2 (TWO) TIMES DAILY WITH A MEAL. Patient not taking: Reported on 07/20/2023 10/12/20   Karamalegos, Alexander J, DO  Multiple Vitamin (MULTIVITAMIN WITH MINERALS) TABS tablet Take 1 tablet by mouth daily. Patient not taking: Reported on 09/20/2022 08/13/22   Caleen Burgess BROCKS, MD  sacubitril -valsartan  (ENTRESTO ) 97-103 MG Take 1 tablet by mouth 2 (two) times daily. Patient not taking: Reported on 07/20/2023 08/15/22  Sabharwal, Aditya, DO    Inpatient Medications: Scheduled Meds:  aspirin   81 mg Oral Daily   atorvastatin   80 mg Oral QHS   carvedilol   3.125 mg Oral BID WC   folic acid   1 mg Oral Daily   insulin  aspart  0-15 Units Subcutaneous Q4H   levothyroxine   175 mcg Oral Q0600   multivitamin with minerals  1 tablet Oral Daily   nitroGLYCERIN   1 inch Topical Q6H   spironolactone   12.5 mg Oral Daily   thiamine   100 mg Oral Daily   Or   thiamine   100 mg Intravenous Daily   Continuous Infusions:  heparin  1,200 Units/hr (11/30/23 0733)   PRN Meds: acetaminophen , LORazepam  **OR** LORazepam , ondansetron  (ZOFRAN ) IV  Allergies:   Allergies  Allergen Reactions   Other Other (See Comments)    BRAZILIAN NUTS ONLY-THROAT CLOSES UP    Social History:   Social History   Socioeconomic History   Marital  status: Married    Spouse name: Not on file   Number of children: Not on file   Years of education: Not on file   Highest education level: Not on file  Occupational History   Not on file  Tobacco Use   Smoking status: Never   Smokeless tobacco: Current    Types: Chew  Vaping Use   Vaping status: Never Used  Substance and Sexual Activity   Alcohol use: Yes    Alcohol/week: 8.0 standard drinks of alcohol    Types: 8 Shots of liquor per week    Comment: drinks a couple of pints of wine daily   Drug use: No   Sexual activity: Yes  Other Topics Concern   Not on file  Social History Narrative   Not on file   Social Drivers of Health   Financial Resource Strain: Not on file  Food Insecurity: No Food Insecurity (07/22/2023)   Hunger Vital Sign    Worried About Running Out of Food in the Last Year: Never true    Ran Out of Food in the Last Year: Never true  Transportation Needs: Unmet Transportation Needs (07/21/2023)   PRAPARE - Administrator, Civil Service (Medical): Yes    Lack of Transportation (Non-Medical): No  Physical Activity: Not on file  Stress: Not on file  Social Connections: Not on file  Intimate Partner Violence: Not At Risk (07/22/2023)   Humiliation, Afraid, Rape, and Kick questionnaire    Fear of Current or Ex-Partner: No    Emotionally Abused: No    Physically Abused: No    Sexually Abused: No     Family History:   Family History  Problem Relation Age of Onset   Hypertension Mother    Cancer Father    Diabetes Sister    Thyroid cancer Sister     ROS:  Review of Systems  Constitutional:  Positive for malaise/fatigue. Negative for chills, diaphoresis, fever and weight loss.  HENT:  Negative for congestion.   Eyes:  Negative for discharge and redness.  Respiratory:  Positive for cough and shortness of breath. Negative for sputum production and wheezing.   Cardiovascular:  Positive for chest pain. Negative for palpitations, orthopnea,  claudication, leg swelling and PND.  Gastrointestinal:  Positive for nausea and vomiting. Negative for abdominal pain and heartburn.  Musculoskeletal:  Negative for falls and myalgias.  Skin:  Negative for rash.  Neurological:  Positive for weakness. Negative for dizziness, tingling, tremors, sensory change, speech change, focal weakness and loss  of consciousness.  Endo/Heme/Allergies:  Does not bruise/bleed easily.  Psychiatric/Behavioral:  Negative for substance abuse. The patient is not nervous/anxious.   All other systems reviewed and are negative.     Physical Exam/Data:   Vitals:   11/30/23 0536 11/30/23 0553 11/30/23 0600 11/30/23 0732  BP: (!) 144/86  (!) 123/92   Pulse: 87 86 85   Resp: (!) 22 17 15    Temp:    98.4 F (36.9 C)  TempSrc:    Oral  SpO2:  97% 94%   Weight:      Height:        Intake/Output Summary (Last 24 hours) at 11/30/2023 0753 Last data filed at 11/30/2023 0548 Gross per 24 hour  Intake 144.09 ml  Output 4000 ml  Net -3855.91 ml   Filed Weights   11/29/23 1813  Weight: 99.3 kg   Body mass index is 34.3 kg/m.   Physical Exam: General: Well developed, well nourished, in no acute distress. Head: Normocephalic, atraumatic, sclera non-icteric, no xanthomas, nares without discharge.  Neck: Negative for carotid bruits. JVD not elevated. Lungs: Clear bilaterally to auscultation without wheezes, rales, or rhonchi. Breathing is unlabored. Heart: RRR with S1 S2. No murmurs, rubs, or gallops appreciated. Abdomen: Soft, non-tender, non-distended with normoactive bowel sounds. No hepatomegaly. No rebound/guarding. No obvious abdominal masses. Msk:  Strength and tone appear normal for age. Extremities: No clubbing or cyanosis. Mild bilateral pretibial edema.  Neuro: Alert and oriented X 3. No facial asymmetry. No focal deficit. Moves all extremities spontaneously. Psych:  Responds to questions appropriately with a normal affect.   EKG:  The EKG was  personally reviewed and demonstrates: Sinus tachycardia, 109 bpm, left axis deviation, poor R wave progression along the precordial leads, LVH, nonspecific ST-T changes.  Repeat EKG shows sinus rhythm with LAFB, rare PVC, nonspecific ST-T changes Telemetry:  Telemetry was personally reviewed and demonstrates: SR  Weights: Filed Weights   11/29/23 1813  Weight: 99.3 kg    Relevant CV Studies:  2D echo 03/26/2018: - Left ventricle: The cavity size was mildly dilated. Wall    thickness was normal. Systolic function was mildly to moderately    reduced. The estimated ejection fraction was in the range of 40%    to 45%. Diffuse hypokinesis. The study is not technically    sufficient to allow evaluation of LV diastolic function.  - Mitral valve: There was mild regurgitation.  - Left atrium: The atrium was moderately dilated.  - Pulmonary arteries: Systolic pressure could not be accurately    estimated.  - Pericardium, extracardiac: A trivial pericardial effusion was    identified.  __________  [facility] 08/09/2022: Conclusions: Severe single-vessel coronary artery disease with thrombotic occlusion of the distal LAD.  There is 50% stenosis at the ostium of the RCA, which does not appear obstructive. Severely reduced left ventricular systolic function (LVEF 25-35%) with severely elevated filling pressure (LVEDP 40 mmHg).  Degree of LVEF is out of proportion to CAD, suggestive of predominantly nonischemic cardiomyopathy. Successful PCI to distal LAD occlusion using Onyx Frontier 2.25 x 18 mm drug-eluting stent with 0% residual stenosis and TIMI-3 flow.   Recommendations: Dual antiplatelet therapy with aspirin  and ticagrelor  for at least 12 months. Obtain echocardiogram to reassess LVEF and exclude LV thrombus in the setting of distal LAD occlusion and severely reduced LVEF. Continue diuresis and judicious escalation of goal-directed medical therapy as tolerated. Discontinue cangrelor  infusion 2  hours after ticagrelor  load was given (infusion to end at 1  PM). __________  2D echo 08/10/2022: 1. Left ventricular ejection fraction, by estimation, is 25 to 30%. The  left ventricle has severely decreased function. The left ventricle  demonstrates global hypokinesis. There is mild left ventricular  hypertrophy. Left ventricular diastolic parameters   are consistent with Grade I diastolic dysfunction (impaired relaxation).   2. Right ventricular systolic function is low normal. The right  ventricular size is normal.   3. The mitral valve is normal in structure. No evidence of mitral valve  regurgitation.   4. The aortic valve is grossly normal. Aortic valve regurgitation is not  visualized.  __________  cMRI 08/12/2022: IMPRESSION: 1. Mildly dilated LV, severely reduced LV systolic function. LVEF 23%. 2. Near transmural apical LGE/scar. 3. LV septal midwall LGE at RV insertion sites. 4. Severely reduced RV function. 5. Findings suggest prior LAD infarct. 6. Mixed ischemic and non-ischemic cardiomyopathy. __________  2D echo 07/21/2023: 1. Left ventricular ejection fraction, by estimation, is 25 to 30%. Left  ventricular ejection fraction by 2D MOD biplane is 26.5 %. The left  ventricle has severely decreased function. The left ventricle demonstrates  global hypokinesis. There is moderate   left ventricular hypertrophy. Left ventricular diastolic parameters are  indeterminate.   2. Right ventricular systolic function is mildly reduced. The right  ventricular size is mildly enlarged.   3. The mitral valve is normal in structure. No evidence of mitral valve  regurgitation.   4. The aortic valve is tricuspid. Aortic valve regurgitation is not  visualized.   5. The inferior vena cava is normal in size with greater than 50%  respiratory variability, suggesting right atrial pressure of 3 mmHg.    Laboratory Data:  Chemistry Recent Labs  Lab 11/29/23 1816 11/30/23 0312  NA  139 140  K 3.7 3.0*  CL 102 104  CO2 26 27  GLUCOSE 232* 119*  BUN 14 15  CREATININE 1.20 1.04  CALCIUM  8.8* 9.0  GFRNONAA >60 >60  ANIONGAP 11 9    Recent Labs  Lab 11/29/23 1816  PROT 7.8  ALBUMIN 3.7  AST 30  ALT 25  ALKPHOS 65  BILITOT 1.2   Hematology Recent Labs  Lab 11/29/23 1816  WBC 6.6  RBC 4.83  HGB 14.7  HCT 44.0  MCV 91.1  MCH 30.4  MCHC 33.4  RDW 13.2  PLT 273   Cardiac EnzymesNo results for input(s): TROPONINI in the last 168 hours. No results for input(s): TROPIPOC in the last 168 hours.  BNP Recent Labs  Lab 11/29/23 1816  BNP 1,433.4*    DDimer No results for input(s): DDIMER in the last 168 hours.  Radiology/Studies:  DG Chest 2 View Result Date: 11/29/2023 IMPRESSION: Mild patchy opacities in the retrocardiac region and right lower lung, which may represent atelectasis or infection. Electronically Signed   By: Greig Pique M.D.   On: 11/29/2023 20:45    Assessment and Plan:   1. CAD involving the native coronary arteries with NSTEMI: - Likely exacerbated by acute on chronic HFrEF with medication noncompliance - Currently without symptoms of angina at rest - Heparin  drip - ASA 81 mg - NPO - Plan for LHC this afternoon - Resume PTA atorvastatin  as outlined below  2. Acute on chronic HFrEF secondary to mixed ICM and NICM: - In the setting of medication noncompliance and ongoing alcohol abuse - Not a candidate for advanced heart failure therapies secondary to ongoing alcohol abuse and medication noncompliance -Resume PTA carvedilol  3.25 mg twice daily and  spironolactone  12.5 mg daily with escalation as tolerated - Following cardiac cath, anticipate further diuresis along with the addition of ARNI and SGLT2 inhibitor - 2D echo has been ordered by admitting service, suspect this will continue to show significant cardiomyopathy - Daily weights with strict I's and O's  3. HTN: - Blood pressure improving, though remains  elevated - Resuming carvedilol  and spironolactone  with recommendation to further escalate pharmacotherapy as outlined above following cardiac cath  4. HLD: - LDL 181 in 07/2022 with LP(a) 158 at that time - Update fasting lipid panel - Resume PTA atorvastatin  80 mg with recommendation to escalate the below therapy as indicated to achieve target LDL less than 55  5. Alcohol abuse: - Cessation advised - Contributing to admission  6. Medication noncompliance: - Adherence recommended, largely precipitated admission  7. Hypokalemia: - Replete to goal 4.0 - Resume PTA spironolactone    Informed Consent   Shared Decision Making/Informed Consent{  The risks [stroke (1 in 1000), death (1 in 1000), kidney failure [usually temporary] (1 in 500), bleeding (1 in 200), allergic reaction [possibly serious] (1 in 200)], benefits (diagnostic support and management of coronary artery disease) and alternatives of a cardiac catheterization were discussed in detail with Shane Frazier and he is willing to proceed.       For questions or updates, please contact [facility] Please consult [email or web address] for contact info under Cardiology/STEMI.   Signed, Bernardino Bring, PA-C [facility] Pager: [phone] 11/30/2023, 7:53 AM

## 2023-11-30 NOTE — Consult Note (Signed)
 PHARMACY - ANTICOAGULATION CONSULT NOTE  Pharmacy Consult for heparin  Indication: chest pain/ACS  Allergies  Allergen Reactions   Other Other (See Comments)    BRAZILIAN NUTS ONLY-THROAT CLOSES UP    Patient Measurements: Height: 5' 7 (170.2 cm) Weight: 99.3 kg (219 lb) IBW/kg (Calculated) : 66.1 HEPARIN  DW (KG): 87.6  Vital Signs: Temp: 98.4 F (36.9 C) (08/14 0732) Temp Source: Oral (08/14 0732) BP: 127/81 (08/14 1009) Pulse Rate: 90 (08/14 1009)  Labs: Recent Labs    11/29/23 1816 11/29/23 2050 11/30/23 0312 11/30/23 1054  HGB 14.7  --   --   --   HCT 44.0  --   --   --   PLT 273  --   --   --   APTT  --  27  --   --   LABPROT  --  14.5  --   --   INR  --  1.1  --   --   HEPARINUNFRC  --   --  0.50 <0.10*  CREATININE 1.20  --  1.04  --   TROPONINIHS 1,194* 1,135*  --   --     Estimated Creatinine Clearance: 89.1 mL/min (by C-G formula based on SCr of 1.04 mg/dL).   Medical History: Past Medical History:  Diagnosis Date   CHF (congestive heart failure), NYHA class I, acute on chronic, systolic (HCC)    Concussion 2017   cause of seizure after hit head at work   Difficult intubation    patient unaware of this diagnosis   ED (erectile dysfunction)    Hypothyroidism    Seizures (HCC) 05-20-15   X 1-PT HIT HIS HEAD AT WORK AND THEN HAD A SEIZURE    Medications:  Scheduled:   aspirin   81 mg Oral Daily   atorvastatin   80 mg Oral QHS   carvedilol   3.125 mg Oral BID WC   folic acid   1 mg Oral Daily   insulin  aspart  0-15 Units Subcutaneous Q4H   levothyroxine   175 mcg Oral Q0600   multivitamin with minerals  1 tablet Oral Daily   nitroGLYCERIN   1 inch Topical Q6H   potassium chloride   40 mEq Oral TID   spironolactone   12.5 mg Oral Daily   thiamine   100 mg Oral Daily   Or   thiamine   100 mg Intravenous Daily   Infusions:   heparin  1,200 Units/hr (11/30/23 0733)   PRN: acetaminophen , LORazepam  **OR** LORazepam , ondansetron  (ZOFRAN )  IV  Assessment: 58 year old male presenting with chest pain and elevated troponin >1,000 x 2. Notable PMH STEMI, OSA, hypothyroidism, T2DM. Renal function stable with Scr of 1.04 mg/dL. CBC stable with hemoglobin of 14.7 g/dL and platelet count of 726 K/uL.   Heparin  level of <0.1 at 1054 is below goal of 0.3 - 0.7. Prior heparin  level of 0.50 at 0312 was therapeutic. Previously received 4,000 units bolus and heparin  infusion of 1,200 units/hr.    Goal of Therapy:  Heparin  level 0.3-0.7 units/ml Monitor platelets by anticoagulation protocol: Yes   Plan:  - Give 2,500 unit bolus x 1  - Increase infusion to 1,500 units/hr  - Monitor heparin  level in 6 hours  - Monitor CBC daily for hemoglobin and platelet count  Shane Frazier - PharmD Candidate 11/30/2023,12:14 PM

## 2023-11-30 NOTE — TOC CM/SW Note (Signed)
..  Transition of Care [facility]) - Inpatient Brief Assessment   Patient Details  Name: Shane Frazier. MRN: [id number] Date of Birth: 06/11/1965  [facility] of [facility] CM/SW Contact:    Edsel DELENA Fischer, LCSW Phone Number: 11/30/2023, 12:32 PM   Clinical Narrative:   SW to handoff to heart failure team to follow up with pt.  If additional needs are present, sw to address  Transition of Care Asessment:

## 2023-11-30 NOTE — Consult Note (Signed)
 PHARMACY - ANTICOAGULATION CONSULT NOTE  Pharmacy Consult for heparin   Indication: chest pain/ACS  Allergies  Allergen Reactions   Other Other (See Comments)    BRAZILIAN NUTS ONLY-THROAT CLOSES UP    Patient Measurements: Height: 5' 7 (170.2 cm) Weight: 99.3 kg (219 lb) IBW/kg (Calculated) : 66.1 HEPARIN  DW (KG): 87.6  Vital Signs: Temp: 98.5 F (36.9 C) (08/13 1809) Temp Source: Oral (08/13 1809) BP: 149/97 (08/14 0230) Pulse Rate: 88 (08/14 0230)  Labs: Recent Labs    11/29/23 1816 11/29/23 2050 11/30/23 0312  HGB 14.7  --   --   HCT 44.0  --   --   PLT 273  --   --   APTT  --  27  --   LABPROT  --  14.5  --   INR  --  1.1  --   HEPARINUNFRC  --   --  0.50  CREATININE 1.20  --   --   TROPONINIHS 1,194* 1,135*  --     Estimated Creatinine Clearance: 77.2 mL/min (by C-G formula based on SCr of 1.2 mg/dL).   Medical History: Past Medical History:  Diagnosis Date   CHF (congestive heart failure), NYHA class I, acute on chronic, systolic (HCC)    Concussion 2017   cause of seizure after hit head at work   Difficult intubation    patient unaware of this diagnosis   ED (erectile dysfunction)    Hypothyroidism    Seizures (HCC) 05-20-15   X 1-PT HIT HIS HEAD AT WORK AND THEN HAD A SEIZURE    Medications:  (Not in a hospital admission)  Scheduled:   aspirin   81 mg Oral Daily   atorvastatin   80 mg Oral QHS   carvedilol   3.125 mg Oral BID WC   folic acid   1 mg Oral Daily   insulin  aspart  0-15 Units Subcutaneous Q4H   levothyroxine   175 mcg Oral Q0600   multivitamin with minerals  1 tablet Oral Daily   nitroGLYCERIN   1 inch Topical Q6H   spironolactone   12.5 mg Oral Daily   thiamine   100 mg Oral Daily   Or   thiamine   100 mg Intravenous Daily   Infusions:   heparin  1,200 Units/hr (11/29/23 2105)   PRN:  Anti-infectives (From admission, onward)    None       Assessment: 58 year old male presented to the ED with SOB for a week and has a hx  of CHF, DM, HTN, HLD, and CAD. Hgb/Plt are stable. Trop elevated at 1194. EKG showed sinus tachy no provider assessment in note. No DOAC PTA. Pharmacy consulted to start heparin  for ACS.   Goal of Therapy:  Heparin  level 0.3-0.7 units/ml Monitor platelets by anticoagulation protocol: Yes   Plan:  8/14:  HL @ 0312 = 0.50, therapeutic X 1 - Will continue pt on current rate and recheck HL in 6 hrs - CBC daily   Sani Loiseau D, PharmD 11/30/2023,3:30 AM

## 2023-12-01 DIAGNOSIS — I2510 Atherosclerotic heart disease of native coronary artery without angina pectoris: Secondary | ICD-10-CM | POA: Diagnosis not present

## 2023-12-01 DIAGNOSIS — I252 Old myocardial infarction: Secondary | ICD-10-CM | POA: Diagnosis not present

## 2023-12-01 DIAGNOSIS — J9601 Acute respiratory failure with hypoxia: Secondary | ICD-10-CM | POA: Diagnosis not present

## 2023-12-01 DIAGNOSIS — E785 Hyperlipidemia, unspecified: Secondary | ICD-10-CM | POA: Diagnosis not present

## 2023-12-01 DIAGNOSIS — I10 Essential (primary) hypertension: Secondary | ICD-10-CM | POA: Diagnosis not present

## 2023-12-01 DIAGNOSIS — I5023 Acute on chronic systolic (congestive) heart failure: Secondary | ICD-10-CM | POA: Diagnosis not present

## 2023-12-01 DIAGNOSIS — I214 Non-ST elevation (NSTEMI) myocardial infarction: Secondary | ICD-10-CM | POA: Diagnosis not present

## 2023-12-01 LAB — CBC
HCT: 41.9 % (ref 39.0–52.0)
Hemoglobin: 13.6 g/dL (ref 13.0–17.0)
MCH: 29.8 pg (ref 26.0–34.0)
MCHC: 32.5 g/dL (ref 30.0–36.0)
MCV: 91.9 fL (ref 80.0–100.0)
Platelets: 239 K/uL (ref 150–400)
RBC: 4.56 MIL/uL (ref 4.22–5.81)
RDW: 13.2 % (ref 11.5–15.5)
WBC: 6.2 K/uL (ref 4.0–10.5)
nRBC: 0 % (ref 0.0–0.2)

## 2023-12-01 LAB — GLUCOSE, CAPILLARY
Glucose-Capillary: 109 mg/dL — ABNORMAL HIGH (ref 70–99)
Glucose-Capillary: 118 mg/dL — ABNORMAL HIGH (ref 70–99)
Glucose-Capillary: 152 mg/dL — ABNORMAL HIGH (ref 70–99)
Glucose-Capillary: 190 mg/dL — ABNORMAL HIGH (ref 70–99)
Glucose-Capillary: 245 mg/dL — ABNORMAL HIGH (ref 70–99)
Glucose-Capillary: 276 mg/dL — ABNORMAL HIGH (ref 70–99)
Glucose-Capillary: 97 mg/dL (ref 70–99)

## 2023-12-01 LAB — LIPID PANEL
Cholesterol: 195 mg/dL (ref 0–200)
HDL: 60 mg/dL (ref >40)
LDL Cholesterol: 122 mg/dL — ABNORMAL HIGH (ref 0–99)
Total CHOL/HDL Ratio: 3.3 ratio
Triglycerides: 67 mg/dL (ref <150)
VLDL: 13 mg/dL (ref 0–40)

## 2023-12-01 LAB — BASIC METABOLIC PANEL WITH GFR
Anion gap: 9 (ref 5–15)
BUN: 22 mg/dL — ABNORMAL HIGH (ref 6–20)
CO2: 27 mmol/L (ref 22–32)
Calcium: 8.9 mg/dL (ref 8.9–10.3)
Chloride: 103 mmol/L (ref 98–111)
Creatinine, Ser: 1.55 mg/dL — ABNORMAL HIGH (ref 0.61–1.24)
GFR, Estimated: 52 mL/min — ABNORMAL LOW (ref >60)
Glucose, Bld: 111 mg/dL — ABNORMAL HIGH (ref 70–99)
Potassium: 3.7 mmol/L (ref 3.5–5.1)
Sodium: 139 mmol/L (ref 135–145)

## 2023-12-01 NOTE — Progress Notes (Signed)
 Progress Note  Patient Name: Shane Frazier. Date of Encounter: 12/01/2023  Primary Cardiologist: Perla Primary Electrophysiologist:  None Advanced Heart Failure: Sabharwal  Subjective   LHC yesterday showed widely patent distal LAD stent with mild in-stent restenosis with no evidence of obstructive CAD and severely elevated LVEDP at 40 mmHg. Documented UOP 3.4 L for the admission to date. Weight incomplete. BUN/SCr trend 15/0.99 to 22/1.55. No chest pain with improving dyspnea. No progressive orthopnea. Improving lower extremity swelling.   Inpatient Medications    Scheduled Meds:  aspirin   81 mg Oral Daily   atorvastatin   80 mg Oral QHS   carvedilol   3.125 mg Oral BID WC   enoxaparin  (LOVENOX ) injection  40 mg Subcutaneous Q24H   folic acid   1 mg Oral Daily   free water   250 mL Oral Once   free water   500 mL Oral Once   furosemide   40 mg Intravenous BID   insulin  aspart  0-15 Units Subcutaneous Q4H   levothyroxine   175 mcg Oral Q0600   multivitamin with minerals  1 tablet Oral Daily   nitroGLYCERIN   1 inch Topical Q6H   sodium chloride  flush  3 mL Intravenous Q12H   spironolactone   12.5 mg Oral Daily   thiamine   100 mg Oral Daily   Or   thiamine   100 mg Intravenous Daily   Continuous Infusions:  sodium chloride  Stopped (11/30/23 1633)   PRN Meds: sodium chloride , acetaminophen , LORazepam  **OR** LORazepam , ondansetron  (ZOFRAN ) IV, sodium chloride  flush   Vital Signs    Vitals:   12/01/23 0422 12/01/23 0451 12/01/23 0737 12/01/23 1141  BP: 115/87  (!) 144/106 (!) 132/96  Pulse: 83  99 94  Resp: 19     Temp: 98 F (36.7 C)  97.7 F (36.5 C) 98.3 F (36.8 C)  TempSrc: Oral  Oral Oral  SpO2: 90%  96% 99%  Weight:  99.3 kg    Height:        Intake/Output Summary (Last 24 hours) at 12/01/2023 1350 Last data filed at 12/01/2023 1023 Gross per 24 hour  Intake 840 ml  Output 400 ml  Net 440 ml   Filed Weights   11/29/23 1813 11/30/23 1434 12/01/23 0451   Weight: 99.3 kg 99.3 kg 99.3 kg    Telemetry    SR with 4 beat run of NSVT - Personally Reviewed  ECG    No new tracings - Personally Reviewed  Physical Exam   GEN: No acute distress.   Neck: JVD elevated. Cardiac: RRR, no murmurs, rubs, or gallops. Right radial arteriotomy site without active bleeding, bruising, swelling, warmth, erythema, or TTP. Radial pulse 2+.  Respiratory: Clear to auscultation bilaterally.  GI: Soft, nontender, non-distended.   MS: No edema; No deformity. Neuro:  Alert and oriented x 3; Nonfocal.  Psych: Normal affect.  Labs    Chemistry Recent Labs  Lab 11/29/23 1816 11/30/23 0312 11/30/23 1810 12/01/23 0324  NA 139 140  --  139  K 3.7 3.0*  --  3.7  CL 102 104  --  103  CO2 26 27  --  27  GLUCOSE 232* 119*  --  111*  BUN 14 15  --  22*  CREATININE 1.20 1.04 0.99 1.55*  CALCIUM  8.8* 9.0  --  8.9  PROT 7.8  --   --   --   ALBUMIN 3.7  --   --   --   AST 30  --   --   --  ALT 25  --   --   --   ALKPHOS 65  --   --   --   BILITOT 1.2  --   --   --   GFRNONAA >60 >60 >60 52*  ANIONGAP 11 9  --  9     Hematology Recent Labs  Lab 11/29/23 1816 11/30/23 1810 12/01/23 0324  WBC 6.6 7.3 6.2  RBC 4.83 5.16 4.56  HGB 14.7 15.9 13.6  HCT 44.0 47.0 41.9  MCV 91.1 91.1 91.9  MCH 30.4 30.8 29.8  MCHC 33.4 33.8 32.5  RDW 13.2 13.2 13.2  PLT 273 235 239    Cardiac EnzymesNo results for input(s): TROPONINI in the last 168 hours. No results for input(s): TROPIPOC in the last 168 hours.   BNP Recent Labs  Lab 11/29/23 1816  BNP 1,433.4*     DDimer No results for input(s): DDIMER in the last 168 hours.   Radiology    DG Chest 2 View Result Date: 11/29/2023 IMPRESSION: Mild patchy opacities in the retrocardiac region and right lower lung, which may represent atelectasis or infection. Electronically Signed   By: Greig Pique M.D.   On: 11/29/2023 20:45    Cardiac Studies   2D echo 03/26/2018: - Left ventricle: The cavity  size was mildly dilated. Wall    thickness was normal. Systolic function was mildly to moderately    reduced. The estimated ejection fraction was in the range of 40%    to 45%. Diffuse hypokinesis. The study is not technically    sufficient to allow evaluation of LV diastolic function.  - Mitral valve: There was mild regurgitation.  - Left atrium: The atrium was moderately dilated.  - Pulmonary arteries: Systolic pressure could not be accurately    estimated.  - Pericardium, extracardiac: A trivial pericardial effusion was    identified.  __________   [facility] 08/09/2022: Conclusions: Severe single-vessel coronary artery disease with thrombotic occlusion of the distal LAD.  There is 50% stenosis at the ostium of the RCA, which does not appear obstructive. Severely reduced left ventricular systolic function (LVEF 25-35%) with severely elevated filling pressure (LVEDP 40 mmHg).  Degree of LVEF is out of proportion to CAD, suggestive of predominantly nonischemic cardiomyopathy. Successful PCI to distal LAD occlusion using Onyx Frontier 2.25 x 18 mm drug-eluting stent with 0% residual stenosis and TIMI-3 flow.   Recommendations: Dual antiplatelet therapy with aspirin  and ticagrelor  for at least 12 months. Obtain echocardiogram to reassess LVEF and exclude LV thrombus in the setting of distal LAD occlusion and severely reduced LVEF. Continue diuresis and judicious escalation of goal-directed medical therapy as tolerated. Discontinue cangrelor  infusion 2 hours after ticagrelor  load was given (infusion to end at 1 PM). __________   2D echo 08/10/2022: 1. Left ventricular ejection fraction, by estimation, is 25 to 30%. The  left ventricle has severely decreased function. The left ventricle  demonstrates global hypokinesis. There is mild left ventricular  hypertrophy. Left ventricular diastolic parameters   are consistent with Grade I diastolic dysfunction (impaired relaxation).   2. Right  ventricular systolic function is low normal. The right  ventricular size is normal.   3. The mitral valve is normal in structure. No evidence of mitral valve  regurgitation.   4. The aortic valve is grossly normal. Aortic valve regurgitation is not  visualized.  __________   cMRI 08/12/2022: IMPRESSION: 1. Mildly dilated LV, severely reduced LV systolic function. LVEF 23%. 2. Near transmural apical LGE/scar. 3. LV septal  midwall LGE at RV insertion sites. 4. Severely reduced RV function. 5. Findings suggest prior LAD infarct. 6. Mixed ischemic and non-ischemic cardiomyopathy. __________   2D echo 07/21/2023: 1. Left ventricular ejection fraction, by estimation, is 25 to 30%. Left  ventricular ejection fraction by 2D MOD biplane is 26.5 %. The left  ventricle has severely decreased function. The left ventricle demonstrates  global hypokinesis. There is moderate   left ventricular hypertrophy. Left ventricular diastolic parameters are  indeterminate.   2. Right ventricular systolic function is mildly reduced. The right  ventricular size is mildly enlarged.   3. The mitral valve is normal in structure. No evidence of mitral valve  regurgitation.   4. The aortic valve is tricuspid. Aortic valve regurgitation is not  visualized.   5. The inferior vena cava is normal in size with greater than 50%  respiratory variability, suggesting right atrial pressure of 3 mmHg.  __________  2D echo 11/30/2023: 1. Left ventricular ejection fraction, by estimation, is 20 to 25%. Left  ventricular ejection fraction by 2D MOD biplane is 25.1 %. The left  ventricle has severely decreased function. The left ventricle demonstrates  global hypokinesis. The left  ventricular internal cavity size was moderately dilated. Left ventricular  diastolic parameters are indeterminate.   2. Right ventricular systolic function is moderately reduced. The right  ventricular size is normal. Tricuspid regurgitation  signal is inadequate  for assessing PA pressure.   3. Left atrial size was mildly dilated.   4. The mitral valve is normal in structure. Mild mitral valve  regurgitation. No evidence of mitral stenosis.   5. The aortic valve is normal in structure. Aortic valve regurgitation is  not visualized. Aortic valve sclerosis is present, with no evidence of  aortic valve stenosis.   6. The inferior vena cava is dilated in size with >50% respiratory  variability, suggesting right atrial pressure of 8 mmHg.  __________  LHC 11/30/2023:   Ost RCA lesion is 40% stenosed.   Dist LAD lesion is 20% stenosed.   1.  Widely patent distal LAD stent with mild in-stent restenosis.  No evidence of obstructive coronary artery disease. 2.  Left ventricular angiography was not performed.  EF was severely reduced by echo. 3.  Severely elevated left ventricular end-diastolic pressure at 40 mmHg.   Recommendations: Elevated troponin is likely due to supply demand mismatch in the setting of acute on chronic systolic heart failure and uncontrolled hypertension.  The patient is severely volume overloaded.  Resume intravenous diuresis with furosemide  40 mg twice daily.  Optimize heart failure medications. I strongly advised him to quit alcohol drinking.  Patient Profile     58 y.o. male with history of CAD with anterior ST elevation MI in 07/2022 s/p PCI/DES to the LAD, HFrEF secondary to mixed NICM and ICM, DM2, HTN, HLD, brain trauma related seizure in 2017, medication noncompliance, and ongoing alcohol abuse who is being seen today for the evaluation of NSTEMI at the request of Dr. Cleatus.   Assessment & Plan    1. CAD involving the native coronary arteries with NSTEMI: - Likely exacerbated by acute on chronic HFrEF with medication noncompliance - LHC 8/14 with widely patent distal LAD stent with mild in-stent restenosis with no evidence of obstructive CAD and severely elevated LVEDP at 40 mmHg - Discontinue  nitroglycerin  paste  - Continue ASA 81 mg and Lipitor  80 mg - Post cath instructions - Cardiac rehab   2. Acute on chronic HFrEF secondary to  mixed ICM and NICM complicated by possible cardiorenal syndrome: - In the setting of medication noncompliance and ongoing alcohol abuse - Not a candidate for advanced heart failure therapies secondary to ongoing alcohol abuse and medication noncompliance - Severely elevated LVEDP of 40 mmHg on LHC 8/14 - Has been started on IV Lasix  40 mg bid with good UOP to date, though he has had AKI associated with this - If he continues to have progressive renal dysfunction with diuresis, may need to augment with milrinone, would like to avoid - PTA carvedilol  3.25 mg twice daily and spironolactone  12.5 mg daily with escalation of GDMT as tolerated - Echo with persistent cardiomyopathy as above - Daily weights with strict I's and O's   3. HTN: - Blood pressure improving - Diuresis as above - PTA carvedilol  and spironolactone     4. HLD: - LDL 122 this admission - LP(a) 158  - Update fasting lipid panel - PTA Lipitor  has been resumed (was not compliant at home)   5. Alcohol abuse: - Cessation advised - Contributing to admission   6. Medication noncompliance: - Adherence recommended, largely precipitated admission   7. Hypokalemia: - Improving  - Replete to goal 4.0 - PTA spironolactone        For questions or updates, please contact [facility] Please consult [email or web address] for contact info under Cardiology/STEMI.    Signed, Bernardino Bring, PA-C [facility] Pager: [phone] 12/01/2023, 1:50 PM

## 2023-12-01 NOTE — Plan of Care (Signed)
  Problem: Education: Goal: Ability to describe self-care measures that may prevent or decrease complications (Diabetes Survival Skills Education) will improve Outcome: Progressing Goal: Individualized Educational Video(s) Outcome: Progressing   Problem: Coping: Goal: Ability to adjust to condition or change in health will improve Outcome: Progressing   Problem: Fluid Volume: Goal: Ability to maintain a balanced intake and output will improve Outcome: Progressing   Problem: Health Behavior/Discharge Planning: Goal: Ability to identify and utilize available resources and services will improve Outcome: Progressing Goal: Ability to manage health-related needs will improve Outcome: Progressing   Problem: Metabolic: Goal: Ability to maintain appropriate glucose levels will improve Outcome: Progressing   Problem: Nutritional: Goal: Maintenance of adequate nutrition will improve Outcome: Progressing Goal: Progress toward achieving an optimal weight will improve Outcome: Progressing   Problem: Skin Integrity: Goal: Risk for impaired skin integrity will decrease Outcome: Progressing   Problem: Tissue Perfusion: Goal: Adequacy of tissue perfusion will improve Outcome: Progressing   Problem: Education: Goal: Understanding of cardiac disease, CV risk reduction, and recovery process will improve Outcome: Progressing Goal: Individualized Educational Video(s) Outcome: Progressing   Problem: Activity: Goal: Ability to tolerate increased activity will improve Outcome: Progressing   Problem: Cardiac: Goal: Ability to achieve and maintain adequate cardiovascular perfusion will improve Outcome: Progressing   Problem: Health Behavior/Discharge Planning: Goal: Ability to safely manage health-related needs after discharge will improve Outcome: Progressing   Problem: Education: Goal: Understanding of CV disease, CV risk reduction, and recovery process will improve Outcome:  Progressing Goal: Individualized Educational Video(s) Outcome: Progressing   Problem: Activity: Goal: Ability to return to baseline activity level will improve Outcome: Progressing   Problem: Cardiovascular: Goal: Ability to achieve and maintain adequate cardiovascular perfusion will improve Outcome: Progressing Goal: Vascular access site(s) Level 0-1 will be maintained Outcome: Progressing   Problem: Health Behavior/Discharge Planning: Goal: Ability to safely manage health-related needs after discharge will improve Outcome: Progressing   Problem: Education: Goal: Knowledge of General Education information will improve Description: Including pain rating scale, medication(s)/side effects and non-pharmacologic comfort measures Outcome: Progressing   Problem: Health Behavior/Discharge Planning: Goal: Ability to manage health-related needs will improve Outcome: Progressing   Problem: Clinical Measurements: Goal: Ability to maintain clinical measurements within normal limits will improve Outcome: Progressing Goal: Will remain free from infection Outcome: Progressing Goal: Diagnostic test results will improve Outcome: Progressing Goal: Respiratory complications will improve Outcome: Progressing Goal: Cardiovascular complication will be avoided Outcome: Progressing   Problem: Activity: Goal: Risk for activity intolerance will decrease Outcome: Progressing   Problem: Nutrition: Goal: Adequate nutrition will be maintained Outcome: Progressing   Problem: Coping: Goal: Level of anxiety will decrease Outcome: Progressing   Problem: Elimination: Goal: Will not experience complications related to bowel motility Outcome: Progressing Goal: Will not experience complications related to urinary retention Outcome: Progressing   Problem: Pain Managment: Goal: General experience of comfort will improve and/or be controlled Outcome: Progressing   Problem: Safety: Goal: Ability  to remain free from injury will improve Outcome: Progressing   Problem: Skin Integrity: Goal: Risk for impaired skin integrity will decrease Outcome: Progressing

## 2023-12-01 NOTE — Assessment & Plan Note (Signed)
 History of anterior STEMI 07/2022 s/p LAD stent/single-vessel CAD Patient reporting intermittent chest pain, troponin bump 89--> 1194 Aspirin , statin, beta-blocker Nitroglycerin  sublingual as needed chest pain with morphine  for breakthrough Started on heparin  infusion Echocardiogram with EF of 20 to 25% global hypokinesis. S/p cardiac catheterization which shows nonobstructive disease with severely elevated pressure consistent with severe volume overload

## 2023-12-01 NOTE — Assessment & Plan Note (Signed)
 Ischemic cardiomyopathy, EF 25 to 30% 07/2023 Acute respiratory failure with hypoxia Patient clinically fluid overloaded BNP 1500, O2 sats 88 on room air.  Repeat echocardiogram with EF of 20 to 25% with global hypokinesis and dilated IVC Cardiology is on board Continue with IV Lasix  Continue oxygen Continue carvedilol , spironolactone  Applying Nitropaste Previously on Entresto  but compliance uncertain-cardiology is planning to restart Entresto  after the cardiac cath Daily weights with intake and output monitoring Daily BMP

## 2023-12-01 NOTE — Progress Notes (Signed)
 Progress Note   Patient: Shane Frazier. FMW:[id number] DOB: Aug 18, 1965 DOA: 11/29/2023     1 DOS: the patient was seen and examined on 12/01/2023   Brief hospital course: Taken from H&P.   Shane Frazier. is a 58 y.o. male with medical history significant for  CAD s/p PCI 2024 for anterior STEMI, , sCHF(EF 25-30% 07/2023), HTN, DM, OSA on CPAP, posttraumatic seizure/chronic headaches,, not on AED, alcohol use, poor outpatient follow up, with last hospitalization in April 2025 for CHF exacerbation, being admitted with CHF exacerbation and NSTEMI  He presented with a 1 week history of dyspnea on exertion and intermittent active substernal, nonradiating chest pain, without nausea vomiting or diaphoresis.  Admits to missed medication.  Does not edition patient was tachycardic with elevated blood pressure at 176/109, saturating 88% on room air so he was placed on 2 L. Labs with troponin of 89>> 94, BNP 1433, blood glucose 232. EKG with sinus tachycardia, no ST-T wave changes Chest x-ray with mild patchy opacities in right lower lung some atelectasis infection.  Patient was started on IV diuresis and heparin  infusion for concern of NSTEMI.  Cardiology was consulted.  8/14: Vital stable on 2 L of oxygen, potassium of 3, creatinine of 1.04. Troponin peaked at 1194, echocardiogram with EF of 20 to 25%, global hypokinesis and moderately dilated LV.  Indeterminate diastolic parameter.  Dilated IVC.  Cardiology is going to take him for cardiac catheterization later today.  Plan is to restart Entresto  and spironolactone  after the cath.  8/15: Vital stable, cardiac cath yesterday with no significant stenosis, patent prior stents, severely reduced EF and severely elevated left ventricular end-diastolic pressure which is consistent with acute on chronic HFrEF and severe volume overload. Lipid panel today with LDL of 122, goal is less than 70.  BMP with increase in creatinine to 1.55 from 0.99.  Cardiology would  like to continue with IV diuresis.  Assessment and Plan: * Acute on chronic HFrEF (heart failure with reduced ejection fraction) (HCC) Ischemic cardiomyopathy, EF 25 to 30% 07/2023 Acute respiratory failure with hypoxia Patient clinically fluid overloaded BNP 1500, O2 sats 88 on room air.  Repeat echocardiogram with EF of 20 to 25% with global hypokinesis and dilated IVC Cardiology is on board Continue with IV Lasix  Continue oxygen Continue carvedilol , spironolactone  Applying Nitropaste Previously on Entresto  but compliance uncertain-cardiology is planning to restart Entresto  after the cardiac cath Daily weights with intake and output monitoring Daily BMP  Non-ST elevation (NSTEMI) myocardial infarction [facility]) History of anterior STEMI 07/2022 s/p LAD stent/single-vessel CAD Patient reporting intermittent chest pain, troponin bump 89--> 1194 Aspirin , statin, beta-blocker Nitroglycerin  sublingual as needed chest pain with morphine  for breakthrough Started on heparin  infusion Echocardiogram with EF of 20 to 25% global hypokinesis. S/p cardiac catheterization which shows nonobstructive disease with severely elevated pressure consistent with severe volume overload   Essential hypertension Continue carvedilol  and spironolactone  Cardiology is also planning to resume Entresto  after the cath  Uncontrolled type 2 diabetes mellitus with hyperglycemia, without long-term current use of insulin  (HCC) Sliding scale insulin  coverage  OSA on CPAP CPAP nightly  Hypothyroidism Continue levothyroxine   Alcohol use disorder CIWA withdrawal protocol  H/O traumatic brain injury/posttraumatic seizure/ headaches No acute issues Not on AEDs As needed pain meds for headache   Subjective: Patient was lying down comfortably when seen today.  No orthopnea.  No chest pain or shortness of breath  Physical Exam: Vitals:   12/01/23 0451 12/01/23 0737 12/01/23 1141 12/01/23  1508  BP:  (!) 144/106 (!)  132/96 103/72  Pulse:  99 94 84  Resp:      Temp:  97.7 F (36.5 C) 98.3 F (36.8 C) 98.4 F (36.9 C)  TempSrc:  Oral Oral Oral  SpO2:  96% 99% 91%  Weight: 99.3 kg     Height:       General.  Obese gentleman, in no acute distress. Pulmonary.  Scant basilar crackles bilaterally, normal respiratory effort. CV.  Regular rate and rhythm, no JVD, rub or murmur. Abdomen.  Soft, nontender, nondistended, BS positive. CNS.  Alert and oriented .  No focal neurologic deficit. Extremities.  No edema, no cyanosis, pulses intact and symmetrical. Psychiatry.  Judgment and insight appears normal.   Data Reviewed: Prior data reviewed  Family Communication: Discussed with patient  Disposition: Status is: Inpatient Remains inpatient appropriate because: Severity of illness  Planned Discharge Destination: Home  DVT prophylaxis.  Heparin  infusion Time spent: 50 minutes  This record has been created using Conservation officer, historic buildings. Errors have been sought and corrected,but may not always be located. Such creation errors do not reflect on the standard of care.   Author: Amaryllis Dare, MD 12/01/2023 4:39 PM  For on call review [email or web address].

## 2023-12-01 NOTE — Plan of Care (Signed)
  Problem: Education: Goal: Ability to describe self-care measures that may prevent or decrease complications (Diabetes Survival Skills Education) will improve Outcome: Progressing   Problem: Education: Goal: Individualized Educational Video(s) Outcome: Progressing   Problem: Coping: Goal: Ability to adjust to condition or change in health will improve Outcome: Progressing

## 2023-12-02 DIAGNOSIS — I429 Cardiomyopathy, unspecified: Secondary | ICD-10-CM

## 2023-12-02 DIAGNOSIS — I10 Essential (primary) hypertension: Secondary | ICD-10-CM | POA: Diagnosis not present

## 2023-12-02 DIAGNOSIS — R7989 Other specified abnormal findings of blood chemistry: Secondary | ICD-10-CM | POA: Diagnosis not present

## 2023-12-02 DIAGNOSIS — I214 Non-ST elevation (NSTEMI) myocardial infarction: Secondary | ICD-10-CM | POA: Diagnosis not present

## 2023-12-02 DIAGNOSIS — J9601 Acute respiratory failure with hypoxia: Secondary | ICD-10-CM | POA: Diagnosis not present

## 2023-12-02 DIAGNOSIS — I5023 Acute on chronic systolic (congestive) heart failure: Secondary | ICD-10-CM | POA: Diagnosis not present

## 2023-12-02 DIAGNOSIS — I252 Old myocardial infarction: Secondary | ICD-10-CM | POA: Diagnosis not present

## 2023-12-02 DIAGNOSIS — E876 Hypokalemia: Secondary | ICD-10-CM

## 2023-12-02 LAB — GLUCOSE, CAPILLARY
Glucose-Capillary: 119 mg/dL — ABNORMAL HIGH (ref 70–99)
Glucose-Capillary: 171 mg/dL — ABNORMAL HIGH (ref 70–99)
Glucose-Capillary: 224 mg/dL — ABNORMAL HIGH (ref 70–99)

## 2023-12-02 LAB — BASIC METABOLIC PANEL WITH GFR
Anion gap: 11 (ref 5–15)
BUN: 26 mg/dL — ABNORMAL HIGH (ref 6–20)
CO2: 26 mmol/L (ref 22–32)
Calcium: 8.8 mg/dL — ABNORMAL LOW (ref 8.9–10.3)
Chloride: 99 mmol/L (ref 98–111)
Creatinine, Ser: 1.19 mg/dL (ref 0.61–1.24)
GFR, Estimated: >60 mL/min (ref >60)
Glucose, Bld: 111 mg/dL — ABNORMAL HIGH (ref 70–99)
Potassium: 3.5 mmol/L (ref 3.5–5.1)
Sodium: 136 mmol/L (ref 135–145)

## 2023-12-02 LAB — MAGNESIUM: Magnesium: 1.8 mg/dL (ref 1.7–2.4)

## 2023-12-02 MED ORDER — FUROSEMIDE 40 MG PO TABS: 40.0000 mg | ORAL_TABLET | Freq: Every day | ORAL | 1 refills | Status: AC

## 2023-12-02 MED ORDER — POTASSIUM CHLORIDE CRYS ER 20 MEQ PO TBCR
40.0000 meq | EXTENDED_RELEASE_TABLET | Freq: Once | ORAL | Status: AC
  Administered 2023-12-02: 40 meq via ORAL
  Filled 2023-12-02: qty 2

## 2023-12-02 MED ORDER — SACUBITRIL-VALSARTAN 24-26 MG PO TABS: 1.0000 | ORAL_TABLET | Freq: Two times a day (BID) | ORAL | 2 refills | Status: AC

## 2023-12-02 MED ORDER — FUROSEMIDE 40 MG PO TABS
40.0000 mg | ORAL_TABLET | Freq: Every day | ORAL | Status: DC
  Administered 2023-12-02: 40 mg via ORAL
  Filled 2023-12-02: qty 1

## 2023-12-02 MED ORDER — LOSARTAN POTASSIUM 25 MG PO TABS
12.5000 mg | ORAL_TABLET | Freq: Every day | ORAL | Status: DC
  Administered 2023-12-02: 12.5 mg via ORAL
  Filled 2023-12-02: qty 1

## 2023-12-02 NOTE — Plan of Care (Signed)
  Problem: Coping: Goal: Ability to adjust to condition or change in health will improve Outcome: Progressing   Problem: Health Behavior/Discharge Planning: Goal: Ability to identify and utilize available resources and services will improve Outcome: Progressing   Problem: Nutritional: Goal: Maintenance of adequate nutrition will improve Outcome: Progressing Goal: Progress toward achieving an optimal weight will improve Outcome: Progressing   Problem: Skin Integrity: Goal: Risk for impaired skin integrity will decrease Outcome: Progressing

## 2023-12-02 NOTE — Discharge Summary (Signed)
 Physician Discharge Summary   Patient: Shane Frazier. MRN: [id number] DOB: [birth year]  Admit date:     11/29/2023  Discharge date: 12/02/23  Discharge Physician: Amaryllis Dare   PCP: Edman Marsa PARAS, DO   Recommendations at discharge:  Please obtain CBC and normal Follow-up with primary care provider Follow-up with cardiology  Discharge Diagnoses: Principal Problem:   Acute on chronic HFrEF (heart failure with reduced ejection fraction) (HCC) Active Problems:   Acute respiratory failure with hypoxia (HCC)   History of anterior ST elevation myocardial infarction 2024 (STEMI)   Non-ST elevation (NSTEMI) myocardial infarction [facility])   Essential hypertension   Uncontrolled type 2 diabetes mellitus with hyperglycemia, without long-term current use of insulin  (HCC)   OSA on CPAP   Hypothyroidism   Alcohol use disorder   H/O traumatic brain injury/posttraumatic seizure/ headaches   SOB (shortness of breath)   Elevated troponin   Hospital Course: Taken from H&P.   Shane Frazier. is a 58 y.o. male with medical history significant for  CAD s/p PCI 2024 for anterior STEMI, , sCHF(EF 25-30% 07/2023), HTN, DM, OSA on CPAP, posttraumatic seizure/chronic headaches,, not on AED, alcohol use, poor outpatient follow up, with last hospitalization in April 2025 for CHF exacerbation, being admitted with CHF exacerbation and NSTEMI  He presented with a 1 week history of dyspnea on exertion and intermittent active substernal, nonradiating chest pain, without nausea vomiting or diaphoresis.  Admits to missed medication.  Does not edition patient was tachycardic with elevated blood pressure at 176/109, saturating 88% on room air so he was placed on 2 L. Labs with troponin of 89>> 94, BNP 1433, blood glucose 232. EKG with sinus tachycardia, no ST-T wave changes Chest x-ray with mild patchy opacities in right lower lung some atelectasis infection.  Patient was started on IV diuresis and heparin   infusion for concern of NSTEMI.  Cardiology was consulted.  8/14: Vital stable on 2 L of oxygen, potassium of 3, creatinine of 1.04. Troponin peaked at 1194, echocardiogram with EF of 20 to 25%, global hypokinesis and moderately dilated LV.  Indeterminate diastolic parameter.  Dilated IVC.  Cardiology is going to take him for cardiac catheterization later today.  Plan is to restart Entresto  and spironolactone  after the cath.  8/15: Vital stable, cardiac cath yesterday with no significant stenosis, patent prior stents, severely reduced EF and severely elevated left ventricular end-diastolic pressure which is consistent with acute on chronic HFrEF and severe volume overload. Lipid panel today with LDL of 122, goal is less than 70.  BMP with increase in creatinine to 1.55 from 0.99.  Cardiology would like to continue with IV diuresis.  8/16: Remained hemodynamically stable, on room air.  Clinically appears euvolemic, cardiology switched him to p.o. Lasix ,  decrease the dose of Entresto  and added carvedilol .  Patient will continue rest of his home medications and follow-up with his providers closely management.  Assessment and Plan: * Acute on chronic HFrEF (heart failure with reduced ejection fraction) (HCC) Ischemic cardiomyopathy, EF 25 to 30% 07/2023 Acute respiratory failure with hypoxia Patient clinically fluid overloaded BNP 1500, O2 sats 88 on room air.  Repeat echocardiogram with EF of 20 to 25% with global hypokinesis and dilated IVC Received IV diuresis and now will continue with daily Continue carvedilol , spironolactone  Home Entresto  dose was decreased Soft blood pressure.  Non-ST elevation (NSTEMI) myocardial infarction [facility]) History of anterior STEMI 07/2022 s/p LAD stent/single-vessel CAD Patient reporting intermittent chest pain, troponin bump 89--> 1194  Aspirin , statin, beta-blocker Echocardiogram with EF of 20 to 25% global hypokinesis. S/p cardiac catheterization which shows  nonobstructive disease with severely elevated pressure consistent with severe volume overload   Essential hypertension Continue carvedilol  and spironolactone  Resuming home Entresto  at a lower dose  Uncontrolled type 2 diabetes mellitus with hyperglycemia, without long-term current use of insulin  (HCC) Sliding scale insulin  coverage  OSA on CPAP CPAP nightly  Hypothyroidism Continue levothyroxine   Alcohol use disorder CIWA withdrawal protocol  H/O traumatic brain injury/posttraumatic seizure/ headaches No acute issues Not on AEDs As needed pain meds for headache  Consultants: Cardiology Procedures performed: Cardiac catheterization Disposition: Home Diet recommendation:  Discharge Diet Orders (From admission, onward)     Start     Ordered   12/02/23 0000  Diet - low sodium heart healthy        12/02/23 1312           Cardiac diet DISCHARGE MEDICATION: Allergies as of 12/02/2023       Reactions   Other Other (See Comments)   BRAZILIAN NUTS ONLY-THROAT CLOSES UP        Medication List     STOP taking these medications    Entresto  49-51 MG Generic drug: sacubitril -valsartan    Entresto  97-103 MG Generic drug: sacubitril -valsartan  Replaced by: sacubitril -valsartan  24-26 MG       TAKE these medications    aspirin  81 MG chewable tablet Chew 1 tablet (81 mg total) by mouth daily.   atorvastatin  80 MG tablet Commonly known as: LIPITOR  Take 1 tablet (80 mg total) by mouth at bedtime.   carvedilol  3.125 MG tablet Commonly known as: COREG  Take 3.125 mg by mouth 2 (two) times daily with a meal.   Farxiga  10 MG Tabs tablet Generic drug: dapagliflozin  propanediol Take 1 tablet (10 mg total) by mouth daily.   folic acid  1 MG tablet Commonly known as: FOLVITE  Take 1 tablet (1 mg total) by mouth daily.   furosemide  40 MG tablet Commonly known as: LASIX  Take 1 tablet (40 mg total) by mouth daily. Start taking on: December 03, 2023 What changed:   when to take this reasons to take this   levothyroxine  175 MCG tablet Commonly known as: SYNTHROID  Take 1 tablet (175 mcg total) by mouth daily before breakfast.   metFORMIN  500 MG tablet Commonly known as: GLUCOPHAGE  TAKE 1 TABLET (500 MG TOTAL) BY MOUTH 2 (TWO) TIMES DAILY WITH A MEAL.   multivitamin with minerals Tabs tablet Take 1 tablet by mouth daily.   sacubitril -valsartan  24-26 MG Commonly known as: Entresto  Take 1 tablet by mouth 2 (two) times daily. Replaces: Entresto  97-103 MG   spironolactone  25 MG tablet Commonly known as: ALDACTONE  Take 0.5 tablets (12.5 mg total) by mouth daily.   thiamine  100 MG tablet Commonly known as: VITAMIN B1 Take 1 tablet (100 mg total) by mouth daily.   ticagrelor  90 MG Tabs tablet Commonly known as: BRILINTA  Take 90 mg by mouth 2 (two) times daily.        Follow-up Information     Edman Marsa PARAS, DO. Schedule an appointment as soon as possible for a visit in 1 week(s).   Specialty: Family Medicine Contact information: [address] [place] [address] [phone]         Perla Evalene PARAS, MD. Schedule an appointment as soon as possible for a visit.   Specialty: Cardiology Contact information: [address] [place] [address] [phone]  Discharge Exam: Filed Weights   11/30/23 1434 12/01/23 0451 12/02/23 0500  Weight: 99.3 kg 99.3 kg 94.9 kg   General.  Overweight gentleman, in no acute distress. Pulmonary.  Lungs clear bilaterally, normal respiratory effort. CV.  Regular rate and rhythm, no JVD, rub or murmur. Abdomen.  Soft, nontender, nondistended, BS positive. CNS.  Alert and oriented .  No focal neurologic deficit. Extremities.  No edema, no cyanosis, pulses intact and symmetrical. Psychiatry.  Judgment and insight appears normal.   Condition at discharge: stable  The results of significant diagnostics from this hospitalization (including imaging,  microbiology, ancillary and laboratory) are listed below for reference.   Imaging Studies: CARDIAC CATHETERIZATION Result Date: 11/30/2023   Ost RCA lesion is 40% stenosed.   Dist LAD lesion is 20% stenosed. 1.  Widely patent distal LAD stent with mild in-stent restenosis.  No evidence of obstructive coronary artery disease. 2.  Left ventricular angiography was not performed.  EF was severely reduced by echo. 3.  Severely elevated left ventricular end-diastolic pressure at 40 mmHg. Recommendations: Elevated troponin is likely due to supply demand mismatch in the setting of acute on chronic systolic heart failure and uncontrolled hypertension.  The patient is severely volume overloaded.  Resume intravenous diuresis with furosemide  40 mg twice daily.  Optimize heart failure medications. I strongly advised him to quit alcohol drinking.   ECHOCARDIOGRAM COMPLETE Result Date: 11/30/2023    ECHOCARDIOGRAM REPORT   Patient Name:   Shane Frazier. Date of Exam: 11/30/2023 Medical Rec #:  [id number]      Height:       67.0 in Accession #:    [id number]     Weight:       219.0 lb Date of Birth:  1966/02/24      BSA:          2.102 m Patient Age:    57 years       BP:           127/81 mmHg Patient Gender: M              HR:           85 bpm. Exam Location:  [facility] Procedure: 2D Echo, Cardiac Doppler and Color Doppler (Both Spectral and Color            Flow Doppler were utilized during procedure). Indications:     NSTEMI  History:         Patient has prior history of Echocardiogram examinations, most                  recent 07/21/2023. CHF, CAD; Risk Factors:Hypertension, Diabetes,                  Dyslipidemia and Sleep Apnea.  Sonographer:     Philomena Daring Referring Phys:  [id number] DELAYNE LULLA SOLIAN Diagnosing Phys: Evalene Lunger MD IMPRESSIONS  1. Left ventricular ejection fraction, by estimation, is 20 to 25%. Left ventricular ejection fraction by 2D MOD biplane is 25.1 %. The left ventricle has severely decreased function.  The left ventricle demonstrates global hypokinesis. The left ventricular internal cavity size was moderately dilated. Left ventricular diastolic parameters are indeterminate.  2. Right ventricular systolic function is moderately reduced. The right ventricular size is normal. Tricuspid regurgitation signal is inadequate for assessing PA pressure.  3. Left atrial size was mildly dilated.  4. The mitral valve is normal in structure. Mild mitral valve regurgitation. No evidence of mitral stenosis.  5. The aortic valve is normal in structure. Aortic valve regurgitation is not visualized. Aortic valve sclerosis is present, with no evidence of aortic valve stenosis.  6. The inferior vena cava is dilated in size with >50% respiratory variability, suggesting right atrial pressure of 8 mmHg. FINDINGS  Left Ventricle: Left ventricular ejection fraction, by estimation, is 20 to 25%. Left ventricular ejection fraction by 2D MOD biplane is 25.1 %. The left ventricle has severely decreased function. The left ventricle demonstrates global hypokinesis. Strain was performed and the global longitudinal strain is indeterminate. The left ventricular internal cavity size was moderately dilated. There is no left ventricular hypertrophy. Left ventricular diastolic parameters are indeterminate. Right Ventricle: The right ventricular size is normal. No increase in right ventricular wall thickness. Right ventricular systolic function is moderately reduced. Tricuspid regurgitation signal is inadequate for assessing PA pressure. Left Atrium: Left atrial size was mildly dilated. Right Atrium: Right atrial size was normal in size. Pericardium: There is no evidence of pericardial effusion. Mitral Valve: The mitral valve is normal in structure. Mild mitral valve regurgitation. No evidence of mitral valve stenosis. Tricuspid Valve: The tricuspid valve is normal in structure. Tricuspid valve regurgitation is mild . No evidence of tricuspid stenosis.  Aortic Valve: The aortic valve is normal in structure. Aortic valve regurgitation is not visualized. Aortic valve sclerosis is present, with no evidence of aortic valve stenosis. Pulmonic Valve: The pulmonic valve was normal in structure. Pulmonic valve regurgitation is not visualized. No evidence of pulmonic stenosis. Aorta: The aortic root is normal in size and structure. Venous: The inferior vena cava is dilated in size with greater than 50% respiratory variability, suggesting right atrial pressure of 8 mmHg. IAS/Shunts: No atrial level shunt detected by color flow Doppler. Additional Comments: 3D was performed not requiring image post processing on an independent workstation and was indeterminate.  LEFT VENTRICLE PLAX 2D                        Biplane EF (MOD) LVIDd:         5.70 cm         LV Biplane EF:   Left LVIDs:         4.70 cm                          ventricular LV PW:         1.10 cm                          ejection LV IVS:        1.00 cm                          fraction by LVOT diam:     2.00 cm                          2D MOD LV SV:         29                               biplane is LV SV Index:   14  25.1 %. LVOT Area:     3.14 cm                                Diastology                                LV e' lateral:   5.77 cm/s LV Volumes (MOD)               LV E/e' lateral: 16.0 LV vol d, MOD    113.0 ml A2C: LV vol d, MOD    139.0 ml A4C: LV vol s, MOD    88.3 ml A2C: LV vol s, MOD    99.4 ml A4C: LV SV MOD A2C:   24.7 ml LV SV MOD A4C:   139.0 ml LV SV MOD BP:    32.6 ml RIGHT VENTRICLE            IVC RV S prime:     9.36 cm/s  IVC diam: 2.60 cm TAPSE (M-mode): 1.6 cm LEFT ATRIUM             Index        RIGHT ATRIUM           Index LA diam:        4.20 cm 2.00 cm/m   RA Area:     18.00 cm LA Vol (A2C):   50.6 ml 24.07 ml/m  RA Volume:   53.10 ml  25.26 ml/m LA Vol (A4C):   54.8 ml 26.07 ml/m LA Biplane Vol: 57.7 ml 27.45 ml/m  AORTIC VALVE LVOT Vmax:    56.20 cm/s LVOT Vmean:  35.300 cm/s LVOT VTI:    0.093 m  AORTA Ao Root diam: 3.00 cm MITRAL VALVE MV Area (PHT): 5.79 cm    SHUNTS MV Decel Time: 131 msec    Systemic VTI:  0.09 m MV E velocity: 92.40 cm/s  Systemic Diam: 2.00 cm Evalene Lunger MD Electronically signed by Evalene Lunger MD Signature Date/Time: 11/30/2023/12:42:19 PM    Final    DG Chest 2 View Result Date: 11/29/2023 CLINICAL DATA:  Shortness of breath EXAM: CHEST - 2 VIEW COMPARISON:  Chest x-ray 07/20/2023 FINDINGS: Heart is enlarged, unchanged. There is some mild patchy opacities in the retrocardiac region and right lower lung. The there is no pleural effusion or pneumothorax. No acute fractures are seen. IMPRESSION: Mild patchy opacities in the retrocardiac region and right lower lung, which may represent atelectasis or infection. Electronically Signed   By: Greig Pique M.D.   On: 11/29/2023 20:45    Microbiology: Results for orders placed or performed during the hospital encounter of 07/20/23  Resp panel by RT-PCR (RSV, Flu A&B, Covid) Anterior Nasal Swab     Status: None   Collection Time: 07/20/23 12:42 PM   Specimen: Anterior Nasal Swab  Result Value Ref Range Status   SARS Coronavirus 2 by RT PCR NEGATIVE NEGATIVE Final    Comment: (NOTE) SARS-CoV-2 target nucleic acids are NOT DETECTED.  The SARS-CoV-2 RNA is generally detectable in upper respiratory specimens during the acute phase of infection. The lowest concentration of SARS-CoV-2 viral copies this assay can detect is 138 copies/mL. A negative result does not preclude SARS-Cov-2 infection and should not be used as the sole basis for treatment or other patient management decisions. A negative result may occur with  improper  specimen collection/handling, submission of specimen other than nasopharyngeal swab, presence of viral mutation(s) within the areas targeted by this assay, and inadequate number of viral copies(<138 copies/mL). A negative result must be  combined with clinical observations, patient history, and epidemiological information. The expected result is Negative.  Fact Sheet for Patients:  BloggerCourse.com  Fact Sheet for Healthcare Providers:  SeriousBroker.it  This test is no t yet approved or cleared by the [place]  FDA and  has been authorized for detection and/or diagnosis of SARS-CoV-2 by FDA under an Emergency Use Authorization (EUA). This EUA will remain  in effect (meaning this test can be used) for the duration of the COVID-19 declaration under Section 564(b)(1) of the Act, 21 U.S.C.section 360bbb-3(b)(1), unless the authorization is terminated  or revoked sooner.       Influenza A by PCR NEGATIVE NEGATIVE Final   Influenza B by PCR NEGATIVE NEGATIVE Final    Comment: (NOTE) The Xpert Xpress SARS-CoV-2/FLU/RSV plus assay is intended as an aid in the diagnosis of influenza from Nasopharyngeal swab specimens and should not be used as a sole basis for treatment. Nasal washings and aspirates are unacceptable for Xpert Xpress SARS-CoV-2/FLU/RSV testing.  Fact Sheet for Patients: BloggerCourse.com  Fact Sheet for Healthcare Providers: SeriousBroker.it  This test is not yet approved or cleared by the [place]  FDA and has been authorized for detection and/or diagnosis of SARS-CoV-2 by FDA under an Emergency Use Authorization (EUA). This EUA will remain in effect (meaning this test can be used) for the duration of the COVID-19 declaration under Section 564(b)(1) of the Act, 21 U.S.C. section 360bbb-3(b)(1), unless the authorization is terminated or revoked.     Resp Syncytial Virus by PCR NEGATIVE NEGATIVE Final    Comment: (NOTE) Fact Sheet for Patients: BloggerCourse.com  Fact Sheet for Healthcare Providers: SeriousBroker.it  This test is not yet  approved or cleared by the [place]  FDA and has been authorized for detection and/or diagnosis of SARS-CoV-2 by FDA under an Emergency Use Authorization (EUA). This EUA will remain in effect (meaning this test can be used) for the duration of the COVID-19 declaration under Section 564(b)(1) of the Act, 21 U.S.C. section 360bbb-3(b)(1), unless the authorization is terminated or revoked.  Performed at [facility], [address] [place], [place] [address]     Labs: CBC: Recent Labs  Lab 11/29/23 1816 11/30/23 1810 12/01/23 0324  WBC 6.6 7.3 6.2  HGB 14.7 15.9 13.6  HCT 44.0 47.0 41.9  MCV 91.1 91.1 91.9  PLT 273 235 239   Basic Metabolic Panel: Recent Labs  Lab 11/29/23 1816 11/30/23 0312 11/30/23 1810 12/01/23 0324 12/02/23 0529 12/02/23 0530  NA 139 140  --  139  --  136  K 3.7 3.0*  --  3.7  --  3.5  CL 102 104  --  103  --  99  CO2 26 27  --  27  --  26  GLUCOSE 232* 119*  --  111*  --  111*  BUN 14 15  --  22*  --  26*  CREATININE 1.20 1.04 0.99 1.55*  --  1.19  CALCIUM  8.8* 9.0  --  8.9  --  8.8*  MG  --   --   --   --  1.8  --    Liver Function Tests: Recent Labs  Lab 11/29/23 1816  AST 30  ALT 25  ALKPHOS 65  BILITOT 1.2  PROT 7.8  ALBUMIN 3.7  CBG: Recent Labs  Lab 12/01/23 2027 12/01/23 2340 12/02/23 0423 12/02/23 0802 12/02/23 1159  GLUCAP 245* 190* 119* 171* 224*    Discharge time spent: greater than 30 minutes.  This record has been created using Conservation officer, historic buildings. Errors have been sought and corrected,but may not always be located. Such creation errors do not reflect on the standard of care.   Signed: Amaryllis Dare, MD [facility] 12/02/2023

## 2023-12-02 NOTE — Discharge Instructions (Signed)
 Intensive Outpatient Programs   [facility]  [facility] [address]  [address] [place],  [place]  [place], [place] [phone]  [facility]  [facility] (Inpatient and outpatient)    [phone] (Suboxone and Methadone) [address] [phone]  [facility] - Intensive Outpatient [address]  [address] [place], [place] [address] [place], [place]  [phone]  [facility] (Outpatient, Inpatient, [facility] (Groups and [facility]idental) (insurance only) [phone] [place], [place]   [phone]   [facility] (for caregivers and family) [address]  [address] [place], [place]  [place], [place] [phone]  Residential Treatment Programs  [facility](2 years) Residential: 90 days)  [facility] ([facility] [address] [facility]  [address] [place], [place]  [place], [place] [phone] or [phone]  [facility] [address] [place], [place]  [place], [place] [phone]  [facility] Facility [facility] ([facility]) [address] [place], [place] [address] [place], [place] [phone] Admissions: 8am-3pm M-F  [facility] Program: Residential Program [phone] Days)           [facility]: [place]  [facility]  [place], [place]  [place], [place]  [phone] or [phone] (Walk in Hours over the weekend or by referral)  [facility] [address] [place], [place] [address] [phone] (Do virtual or phone assessment, offer transportation within 25 miles, have in patient and Outpatient options)   [facility]:  [facility]:[phone] (for crisis response 24 hours a day)      Intensive Outpatient Programs   [facility] [address] [place],  [place], [place] [phone]  [facility] (Inpatient and outpatient)[phone] (Suboxone and Methadone) [address] [phone]  [facility]: [facility] - Intensive Outpatient [address] [place], [place] [address], [place]  [phone]  [facility] (Outpatient, Inpatient, [facility] (Groups and [facility]idental) (insurance only) [phone] [place], [place] [phone]   [facility] (for caregivers and family) [address] [place] [address], [place], [place] [phone]  Residential Treatment Programs  [facility](2 years) Residential: 66 days)[facility] ([facility] 700 [facility] [address] [place], [place], [place] [phone] or [phone]  [facility] [address] [place], [place], [place] [phone]  [facility] Facility[facility] ([facility]) [address] [place], [place], [place] [phone] Admissions: 8am-3pm M-F  [facility] Program: Residential Program [phone] Days)             [facility]: [place]  [facility], [place], [place]  [phone] or [phone] in Hours over the weekend or by referral)  [facility] [address] [place], [place] [address] [phone] (Do virtual or phone assessment, offer transportation within 25 miles, have in patient and  Outpatient options)   [facility] Crisis: [facility]:[phone] (for crisis response 24 hours a day)

## 2023-12-02 NOTE — Progress Notes (Signed)
 Progress Note  Patient Name: Shane Frazier. Date of Encounter: 12/02/2023  Primary Cardiologist: Perla Primary Electrophysiologist:  None Advanced Heart Failure: Sabharwal  Subjective   No chest pain. Dyspnea resolved. Feels back to baseline. No documented UOP for the past 24 hours. Weight 99 to 94.9 kg over the past 24 hours. Renal function improving with diuresis.   Inpatient Medications    Scheduled Meds:  aspirin   81 mg Oral Daily   atorvastatin   80 mg Oral QHS   carvedilol   3.125 mg Oral BID WC   enoxaparin  (LOVENOX ) injection  40 mg Subcutaneous Q24H   folic acid   1 mg Oral Daily   insulin  aspart  0-15 Units Subcutaneous Q4H   levothyroxine   175 mcg Oral Q0600   multivitamin with minerals  1 tablet Oral Daily   sodium chloride  flush  3 mL Intravenous Q12H   spironolactone   12.5 mg Oral Daily   thiamine   100 mg Oral Daily   Or   thiamine   100 mg Intravenous Daily   Continuous Infusions:   PRN Meds: acetaminophen , LORazepam  **OR** LORazepam , ondansetron  (ZOFRAN ) IV, sodium chloride  flush   Vital Signs    Vitals:   12/01/23 2339 12/02/23 0422 12/02/23 0500 12/02/23 0753  BP: 106/74 (!) 121/94  (!) 129/96  Pulse: 82 82  89  Resp: 18 18  18   Temp: 98.3 F (36.8 C) 97.8 F (36.6 C)  99.3 F (37.4 C)  TempSrc: Oral Oral  Oral  SpO2: 99% 94%  99%  Weight:   94.9 kg   Height:        Intake/Output Summary (Last 24 hours) at 12/02/2023 0935 Last data filed at 12/02/2023 0500 Gross per 24 hour  Intake 1200 ml  Output --  Net 1200 ml   Filed Weights   11/30/23 1434 12/01/23 0451 12/02/23 0500  Weight: 99.3 kg 99.3 kg 94.9 kg    Telemetry    SR with occasional PVCs and a 4 beat run of NSVT - Personally Reviewed  ECG    No new tracings - Personally Reviewed  Physical Exam   GEN: No acute distress.   Neck: JVD elevated. Cardiac: RRR, no murmurs, rubs, or gallops. Right radial arteriotomy site without active bleeding, bruising, swelling, warmth,  erythema, or TTP. Radial pulse 2+.  Respiratory: Clear to auscultation bilaterally.  GI: Soft, nontender, non-distended.   MS: No edema; No deformity. Neuro:  Alert and oriented x 3; Nonfocal.  Psych: Normal affect.  Labs    Chemistry Recent Labs  Lab 11/29/23 1816 11/30/23 0312 11/30/23 1810 12/01/23 0324 12/02/23 0530  NA 139 140  --  139 136  K 3.7 3.0*  --  3.7 3.5  CL 102 104  --  103 99  CO2 26 27  --  27 26  GLUCOSE 232* 119*  --  111* 111*  BUN 14 15  --  22* 26*  CREATININE 1.20 1.04 0.99 1.55* 1.19  CALCIUM  8.8* 9.0  --  8.9 8.8*  PROT 7.8  --   --   --   --   ALBUMIN 3.7  --   --   --   --   AST 30  --   --   --   --   ALT 25  --   --   --   --   ALKPHOS 65  --   --   --   --   BILITOT 1.2  --   --   --   --  GFRNONAA >60 >60 >60 52* >60  ANIONGAP 11 9  --  9 11     Hematology Recent Labs  Lab 11/29/23 1816 11/30/23 1810 12/01/23 0324  WBC 6.6 7.3 6.2  RBC 4.83 5.16 4.56  HGB 14.7 15.9 13.6  HCT 44.0 47.0 41.9  MCV 91.1 91.1 91.9  MCH 30.4 30.8 29.8  MCHC 33.4 33.8 32.5  RDW 13.2 13.2 13.2  PLT 273 235 239    Cardiac EnzymesNo results for input(s): TROPONINI in the last 168 hours. No results for input(s): TROPIPOC in the last 168 hours.   BNP Recent Labs  Lab 11/29/23 1816  BNP 1,433.4*     DDimer No results for input(s): DDIMER in the last 168 hours.   Radiology    DG Chest 2 View Result Date: 11/29/2023 IMPRESSION: Mild patchy opacities in the retrocardiac region and right lower lung, which may represent atelectasis or infection. Electronically Signed   By: Greig Pique M.D.   On: 11/29/2023 20:45    Cardiac Studies   2D echo 03/26/2018: - Left ventricle: The cavity size was mildly dilated. Wall    thickness was normal. Systolic function was mildly to moderately    reduced. The estimated ejection fraction was in the range of 40%    to 45%. Diffuse hypokinesis. The study is not technically    sufficient to allow evaluation  of LV diastolic function.  - Mitral valve: There was mild regurgitation.  - Left atrium: The atrium was moderately dilated.  - Pulmonary arteries: Systolic pressure could not be accurately    estimated.  - Pericardium, extracardiac: A trivial pericardial effusion was    identified.  __________   [facility] 08/09/2022: Conclusions: Severe single-vessel coronary artery disease with thrombotic occlusion of the distal LAD.  There is 50% stenosis at the ostium of the RCA, which does not appear obstructive. Severely reduced left ventricular systolic function (LVEF 25-35%) with severely elevated filling pressure (LVEDP 40 mmHg).  Degree of LVEF is out of proportion to CAD, suggestive of predominantly nonischemic cardiomyopathy. Successful PCI to distal LAD occlusion using Onyx Frontier 2.25 x 18 mm drug-eluting stent with 0% residual stenosis and TIMI-3 flow.   Recommendations: Dual antiplatelet therapy with aspirin  and ticagrelor  for at least 12 months. Obtain echocardiogram to reassess LVEF and exclude LV thrombus in the setting of distal LAD occlusion and severely reduced LVEF. Continue diuresis and judicious escalation of goal-directed medical therapy as tolerated. Discontinue cangrelor  infusion 2 hours after ticagrelor  load was given (infusion to end at 1 PM). __________   2D echo 08/10/2022: 1. Left ventricular ejection fraction, by estimation, is 25 to 30%. The  left ventricle has severely decreased function. The left ventricle  demonstrates global hypokinesis. There is mild left ventricular  hypertrophy. Left ventricular diastolic parameters   are consistent with Grade I diastolic dysfunction (impaired relaxation).   2. Right ventricular systolic function is low normal. The right  ventricular size is normal.   3. The mitral valve is normal in structure. No evidence of mitral valve  regurgitation.   4. The aortic valve is grossly normal. Aortic valve regurgitation is not  visualized.   __________   cMRI 08/12/2022: IMPRESSION: 1. Mildly dilated LV, severely reduced LV systolic function. LVEF 23%. 2. Near transmural apical LGE/scar. 3. LV septal midwall LGE at RV insertion sites. 4. Severely reduced RV function. 5. Findings suggest prior LAD infarct. 6. Mixed ischemic and non-ischemic cardiomyopathy. __________   2D echo 07/21/2023: 1. Left ventricular ejection fraction,  by estimation, is 25 to 30%. Left  ventricular ejection fraction by 2D MOD biplane is 26.5 %. The left  ventricle has severely decreased function. The left ventricle demonstrates  global hypokinesis. There is moderate   left ventricular hypertrophy. Left ventricular diastolic parameters are  indeterminate.   2. Right ventricular systolic function is mildly reduced. The right  ventricular size is mildly enlarged.   3. The mitral valve is normal in structure. No evidence of mitral valve  regurgitation.   4. The aortic valve is tricuspid. Aortic valve regurgitation is not  visualized.   5. The inferior vena cava is normal in size with greater than 50%  respiratory variability, suggesting right atrial pressure of 3 mmHg.  __________  2D echo 11/30/2023: 1. Left ventricular ejection fraction, by estimation, is 20 to 25%. Left  ventricular ejection fraction by 2D MOD biplane is 25.1 %. The left  ventricle has severely decreased function. The left ventricle demonstrates  global hypokinesis. The left  ventricular internal cavity size was moderately dilated. Left ventricular  diastolic parameters are indeterminate.   2. Right ventricular systolic function is moderately reduced. The right  ventricular size is normal. Tricuspid regurgitation signal is inadequate  for assessing PA pressure.   3. Left atrial size was mildly dilated.   4. The mitral valve is normal in structure. Mild mitral valve  regurgitation. No evidence of mitral stenosis.   5. The aortic valve is normal in structure. Aortic valve  regurgitation is  not visualized. Aortic valve sclerosis is present, with no evidence of  aortic valve stenosis.   6. The inferior vena cava is dilated in size with >50% respiratory  variability, suggesting right atrial pressure of 8 mmHg.  __________  L[facility] 11/30/2023:   Ost RCA lesion is 40% stenosed.   Dist LAD lesion is 20% stenosed.   1.  Widely patent distal LAD stent with mild in-stent restenosis.  No evidence of obstructive coronary artery disease. 2.  Left ventricular angiography was not performed.  EF was severely reduced by echo. 3.  Severely elevated left ventricular end-diastolic pressure at 40 mmHg.   Recommendations: Elevated troponin is likely due to supply demand mismatch in the setting of acute on chronic systolic heart failure and uncontrolled hypertension.  The patient is severely volume overloaded.  Resume intravenous diuresis with furosemide  40 mg twice daily.  Optimize heart failure medications. I strongly advised him to quit alcohol drinking.  Patient Profile     58 y.o. male with history of CAD with anterior ST elevation MI in 07/2022 s/p PCI/DES to the LAD, HFrEF secondary to mixed NICM and ICM, DM2, HTN, HLD, brain trauma related seizure in 2017, medication noncompliance, and ongoing alcohol abuse who is being seen today for the evaluation of NSTEMI at the request of Dr. Cleatus.   Assessment & Plan    1. CAD involving the native coronary arteries with NSTEMI: - Likely exacerbated by acute on chronic HFrEF with medication noncompliance - LHC 8/14 with widely patent distal LAD stent with mild in-stent restenosis with no evidence of obstructive CAD and severely elevated LVEDP at 40 mmHg - Continue ASA 81 mg and Lipitor  80 mg - Post cath instructions; patient will need to be out of work for the next week due to lifting restrictions  - Cardiac rehab   2. Acute on chronic HFrEF secondary to mixed ICM and NICM complicated by possible cardiorenal syndrome: - In the  setting of medication noncompliance and ongoing alcohol abuse - Not  a candidate for advanced heart failure therapies secondary to ongoing alcohol abuse and medication noncompliance - Severely elevated LVEDP of 40 mmHg on LHC 8/14 - Volume status improved - PTA, he was on Entresto ; BP intermittently soft, will start low dose losartan  with plans to transition to Entresto  in the outpatient setting as able - PTA carvedilol  3.25 mg twice daily and spironolactone  12.5 mg daily with escalation of GDMT as tolerated - Transition to oral Lasix  40 mg daily  - Echo with persistent cardiomyopathy as above - Daily weights with strict I's and O's   3. HTN: - Blood pressure improved - Pharmacotherapy as above   4. HLD: - LDL 122 this admission - LP(a) 158  - PTA Lipitor  has been resumed (was not compliant at home)   5. Alcohol abuse: - Cessation advised - Contributing to admission   6. Medication noncompliance: - Adherence recommended, largely precipitated admission   7. Hypokalemia: - Improving  - Replete to goal 4.0 - PTA spironolactone  - Check magnesium  with recommendation to replete to goal 2.0 as indicated        For questions or updates, please contact [facility] Please consult [email or web address] for contact info under Cardiology/STEMI.    Signed, Bernardino Bring, PA-C [facility] Pager: [phone] 12/02/2023, 9:35 AM
